# Patient Record
Sex: Female | Born: 1950
Health system: Southern US, Community
[De-identification: ages and names within clinical notes are randomized; demographics above are authoritative.]

## PROBLEM LIST (undated history)

## (undated) DIAGNOSIS — T148XXA Other injury of unspecified body region, initial encounter: Secondary | ICD-10-CM

## (undated) DIAGNOSIS — C44612 Basal cell carcinoma of skin of right upper limb, including shoulder: Secondary | ICD-10-CM

## (undated) DIAGNOSIS — Z9889 Other specified postprocedural states: Secondary | ICD-10-CM

## (undated) DIAGNOSIS — K683 Retroperitoneal hematoma: Secondary | ICD-10-CM

## (undated) DIAGNOSIS — R0600 Dyspnea, unspecified: Secondary | ICD-10-CM

## (undated) DIAGNOSIS — G43909 Migraine, unspecified, not intractable, without status migrainosus: Secondary | ICD-10-CM

## (undated) DIAGNOSIS — I2699 Other pulmonary embolism without acute cor pulmonale: Secondary | ICD-10-CM

## (undated) DIAGNOSIS — M1990 Unspecified osteoarthritis, unspecified site: Secondary | ICD-10-CM

## (undated) DIAGNOSIS — K219 Gastro-esophageal reflux disease without esophagitis: Secondary | ICD-10-CM

## (undated) DIAGNOSIS — I2510 Atherosclerotic heart disease of native coronary artery without angina pectoris: Secondary | ICD-10-CM

## (undated) DIAGNOSIS — G2581 Restless legs syndrome: Secondary | ICD-10-CM

## (undated) DIAGNOSIS — I219 Acute myocardial infarction, unspecified: Secondary | ICD-10-CM

## (undated) DIAGNOSIS — E7800 Pure hypercholesterolemia, unspecified: Secondary | ICD-10-CM

## (undated) DIAGNOSIS — E039 Hypothyroidism, unspecified: Secondary | ICD-10-CM

## (undated) DIAGNOSIS — I82409 Acute embolism and thrombosis of unspecified deep veins of unspecified lower extremity: Secondary | ICD-10-CM

## (undated) DIAGNOSIS — M533 Sacrococcygeal disorders, not elsewhere classified: Secondary | ICD-10-CM

## (undated) DIAGNOSIS — G8929 Other chronic pain: Secondary | ICD-10-CM

## (undated) DIAGNOSIS — I341 Nonrheumatic mitral (valve) prolapse: Secondary | ICD-10-CM

## (undated) DIAGNOSIS — I5042 Chronic combined systolic (congestive) and diastolic (congestive) heart failure: Secondary | ICD-10-CM

## (undated) DIAGNOSIS — R112 Nausea with vomiting, unspecified: Secondary | ICD-10-CM

## (undated) DIAGNOSIS — R0609 Other forms of dyspnea: Secondary | ICD-10-CM

## (undated) DIAGNOSIS — K661 Hemoperitoneum: Secondary | ICD-10-CM

## (undated) HISTORY — DX: Other pulmonary embolism without acute cor pulmonale: I26.99

## (undated) HISTORY — DX: Hypothyroidism, unspecified: E03.9

## (undated) HISTORY — DX: Hemoperitoneum: K66.1

## (undated) HISTORY — DX: Restless legs syndrome: G25.81

## (undated) HISTORY — DX: Pure hypercholesterolemia, unspecified: E78.00

## (undated) HISTORY — DX: Retroperitoneal hematoma: K68.3

## (undated) HISTORY — DX: Other forms of dyspnea: R06.09

## (undated) HISTORY — DX: Unspecified osteoarthritis, unspecified site: M19.90

## (undated) HISTORY — DX: Atherosclerotic heart disease of native coronary artery without angina pectoris: I25.10

## (undated) HISTORY — PX: JOINT REPLACEMENT: SHX530

## (undated) HISTORY — DX: Dyspnea, unspecified: R06.00

## (undated) HISTORY — PX: ANTERIOR CRUCIATE LIGAMENT REPAIR: SHX115

---

## 1996-06-08 HISTORY — PX: TOTAL ABDOMINAL HYSTERECTOMY: SHX209

## 1998-12-07 ENCOUNTER — Encounter: Payer: Self-pay | Admitting: Emergency Medicine

## 1998-12-07 ENCOUNTER — Emergency Department (HOSPITAL_COMMUNITY): Admission: EM | Admit: 1998-12-07 | Discharge: 1998-12-07 | Payer: Self-pay | Admitting: Emergency Medicine

## 2001-04-13 ENCOUNTER — Other Ambulatory Visit: Admission: RE | Admit: 2001-04-13 | Discharge: 2001-04-13 | Payer: Self-pay | Admitting: Gynecology

## 2002-05-26 ENCOUNTER — Other Ambulatory Visit: Admission: RE | Admit: 2002-05-26 | Discharge: 2002-05-26 | Payer: Self-pay | Admitting: Gynecology

## 2003-04-27 ENCOUNTER — Encounter: Admission: RE | Admit: 2003-04-27 | Discharge: 2003-06-13 | Payer: Self-pay | Admitting: Orthopedic Surgery

## 2003-09-11 ENCOUNTER — Other Ambulatory Visit: Admission: RE | Admit: 2003-09-11 | Discharge: 2003-09-11 | Payer: Self-pay | Admitting: Gynecology

## 2004-11-05 ENCOUNTER — Other Ambulatory Visit: Admission: RE | Admit: 2004-11-05 | Discharge: 2004-11-05 | Payer: Self-pay | Admitting: Gynecology

## 2005-02-05 ENCOUNTER — Emergency Department (HOSPITAL_COMMUNITY): Admission: EM | Admit: 2005-02-05 | Discharge: 2005-02-05 | Payer: Self-pay | Admitting: Family Medicine

## 2005-03-11 ENCOUNTER — Ambulatory Visit (HOSPITAL_COMMUNITY): Admission: RE | Admit: 2005-03-11 | Discharge: 2005-03-11 | Payer: Self-pay | Admitting: Orthopedic Surgery

## 2005-05-31 ENCOUNTER — Encounter: Admission: RE | Admit: 2005-05-31 | Discharge: 2005-05-31 | Payer: Self-pay | Admitting: Orthopedic Surgery

## 2005-06-16 ENCOUNTER — Encounter: Admission: RE | Admit: 2005-06-16 | Discharge: 2005-07-23 | Payer: Self-pay | Admitting: Orthopedic Surgery

## 2007-01-14 ENCOUNTER — Emergency Department (HOSPITAL_COMMUNITY): Admission: EM | Admit: 2007-01-14 | Discharge: 2007-01-15 | Payer: Self-pay | Admitting: Emergency Medicine

## 2007-07-22 ENCOUNTER — Other Ambulatory Visit: Admission: RE | Admit: 2007-07-22 | Discharge: 2007-07-22 | Payer: Self-pay | Admitting: Gynecology

## 2007-07-27 ENCOUNTER — Ambulatory Visit (HOSPITAL_COMMUNITY): Admission: RE | Admit: 2007-07-27 | Discharge: 2007-07-27 | Payer: Self-pay | Admitting: Gynecology

## 2008-10-30 ENCOUNTER — Other Ambulatory Visit: Admission: RE | Admit: 2008-10-30 | Discharge: 2008-10-30 | Payer: Self-pay | Admitting: Gynecology

## 2008-10-30 ENCOUNTER — Encounter: Payer: Self-pay | Admitting: Gynecology

## 2008-10-30 ENCOUNTER — Ambulatory Visit: Payer: Self-pay | Admitting: Gynecology

## 2008-12-16 ENCOUNTER — Emergency Department (HOSPITAL_COMMUNITY): Admission: EM | Admit: 2008-12-16 | Discharge: 2008-12-16 | Payer: Self-pay | Admitting: Emergency Medicine

## 2010-01-13 ENCOUNTER — Ambulatory Visit: Payer: Self-pay | Admitting: Gynecology

## 2010-01-13 ENCOUNTER — Other Ambulatory Visit: Admission: RE | Admit: 2010-01-13 | Discharge: 2010-01-13 | Payer: Self-pay | Admitting: Gynecology

## 2010-03-11 ENCOUNTER — Ambulatory Visit (HOSPITAL_COMMUNITY): Admission: RE | Admit: 2010-03-11 | Discharge: 2010-03-11 | Payer: Self-pay | Admitting: Gynecology

## 2010-09-14 LAB — DIFFERENTIAL
Basophils Relative: 1 % (ref 0–1)
Eosinophils Absolute: 0 10*3/uL (ref 0.0–0.7)
Eosinophils Relative: 0 % (ref 0–5)
Monocytes Relative: 5 % (ref 3–12)
Neutrophils Relative %: 68 % (ref 43–77)

## 2010-09-14 LAB — URINALYSIS, ROUTINE W REFLEX MICROSCOPIC
Bilirubin Urine: NEGATIVE
Hgb urine dipstick: NEGATIVE
Ketones, ur: 40 mg/dL — AB
Protein, ur: NEGATIVE mg/dL
Urobilinogen, UA: 0.2 mg/dL (ref 0.0–1.0)

## 2010-09-14 LAB — POCT I-STAT, CHEM 8
Calcium, Ion: 1.17 mmol/L (ref 1.12–1.32)
Creatinine, Ser: 0.7 mg/dL (ref 0.4–1.2)
Glucose, Bld: 107 mg/dL — ABNORMAL HIGH (ref 70–99)
Hemoglobin: 14.6 g/dL (ref 12.0–15.0)
Sodium: 136 mEq/L (ref 135–145)
TCO2: 23 mmol/L (ref 0–100)

## 2010-09-14 LAB — CBC
MCHC: 33.4 g/dL (ref 30.0–36.0)
MCV: 90.1 fL (ref 78.0–100.0)
RBC: 4.55 MIL/uL (ref 3.87–5.11)

## 2011-03-23 LAB — URINALYSIS, ROUTINE W REFLEX MICROSCOPIC
Bilirubin Urine: NEGATIVE
Hgb urine dipstick: NEGATIVE
Protein, ur: NEGATIVE
Urobilinogen, UA: 0.2

## 2011-03-23 LAB — DIFFERENTIAL
Basophils Absolute: 0.3 — ABNORMAL HIGH
Basophils Relative: 2 — ABNORMAL HIGH
Eosinophils Absolute: 0.1
Monocytes Absolute: 0.9 — ABNORMAL HIGH
Neutro Abs: 11.9 — ABNORMAL HIGH
Neutrophils Relative %: 77

## 2011-03-23 LAB — BASIC METABOLIC PANEL
CO2: 25
Calcium: 9.8
Chloride: 98
Creatinine, Ser: 0.81
Glucose, Bld: 120 — ABNORMAL HIGH

## 2011-03-23 LAB — CBC
Hemoglobin: 13.5
MCHC: 34.7
MCV: 86.1
RDW: 13.8

## 2011-06-16 ENCOUNTER — Encounter: Payer: Self-pay | Admitting: Gynecology

## 2011-06-29 ENCOUNTER — Other Ambulatory Visit: Payer: Self-pay | Admitting: Gynecology

## 2011-07-03 ENCOUNTER — Other Ambulatory Visit: Payer: Self-pay | Admitting: Gynecology

## 2011-07-08 DIAGNOSIS — E039 Hypothyroidism, unspecified: Secondary | ICD-10-CM | POA: Insufficient documentation

## 2011-07-08 DIAGNOSIS — E7800 Pure hypercholesterolemia, unspecified: Secondary | ICD-10-CM | POA: Insufficient documentation

## 2011-07-09 ENCOUNTER — Encounter: Payer: Self-pay | Admitting: Gynecology

## 2011-07-09 ENCOUNTER — Ambulatory Visit (INDEPENDENT_AMBULATORY_CARE_PROVIDER_SITE_OTHER): Payer: 59 | Admitting: Gynecology

## 2011-07-09 ENCOUNTER — Other Ambulatory Visit (HOSPITAL_COMMUNITY)
Admission: RE | Admit: 2011-07-09 | Discharge: 2011-07-09 | Disposition: A | Discharge status: Home / Self Care | Payer: 59 | Source: Ambulatory Visit | Attending: Gynecology | Admitting: Gynecology

## 2011-07-09 DIAGNOSIS — Z1211 Encounter for screening for malignant neoplasm of colon: Secondary | ICD-10-CM

## 2011-07-09 DIAGNOSIS — Z01419 Encounter for gynecological examination (general) (routine) without abnormal findings: Secondary | ICD-10-CM | POA: Insufficient documentation

## 2011-07-09 DIAGNOSIS — K921 Melena: Secondary | ICD-10-CM

## 2011-07-09 DIAGNOSIS — E65 Localized adiposity: Secondary | ICD-10-CM

## 2011-07-09 DIAGNOSIS — N951 Menopausal and female climacteric states: Secondary | ICD-10-CM

## 2011-07-09 DIAGNOSIS — Z79890 Hormone replacement therapy (postmenopausal): Secondary | ICD-10-CM

## 2011-07-09 MED ORDER — ESTROGENS CONJUGATED 0.45 MG PO TABS: ORAL_TABLET | ORAL | Status: DC

## 2011-07-09 NOTE — Patient Instructions (Signed)
Brittany Mason, please don't forget to schedule her colonoscopy                                                     .Colonoscopy  A colonoscopy is an exam to evaluate your entire colon. In this exam, your colon is cleansed. A long fiberoptic tube is inserted through your rectum and into your colon. The fiberoptic scope (endoscope) is a long bundle of enclosed and very flexible fibers. These fibers transmit light to the area examined and send images from that area to your caregiver. Discomfort is usually minimal. You may be given a drug to help you sleep (sedative) during or prior to the procedure. This exam helps to detect lumps (tumors), polyps, inflammation, and areas of bleeding. Your caregiver may also take a small piece of tissue (biopsy) that will be examined under a microscope. LET YOUR CAREGIVER KNOW ABOUT:   Allergies to food or medicine.   Medicines taken, including vitamins, herbs, eyedrops, over-the-counter medicines, and creams.   Use of steroids (by mouth or creams).   Previous problems with anesthetics or numbing medicines.   History of bleeding problems or blood clots.   Previous surgery.   Other health problems, including diabetes and kidney problems.   Possibility of pregnancy, if this applies.  BEFORE THE PROCEDURE   A clear liquid diet may be required for 2 days before the exam.   Ask your caregiver about changing or stopping your regular medications.   Liquid injections (enemas) or laxatives may be required.   A large amount of electrolyte solution may be given to you to drink over a short period of time. This solution is used to clean out your colon.   You should be present 60 minutes prior to your procedure or as directed by your caregiver.  AFTER THE PROCEDURE   If you received a sedative or pain relieving medication, you will need to arrange for someone to drive you home.   Occasionally, there is a little blood passed with the first bowel movement. Do not be  concerned.  FINDING OUT THE RESULTS OF YOUR TEST Not all test results are available during your visit. If your test results are not back during the visit, make an appointment with your caregiver to find out the results. Do not assume everything is normal if you have not heard from your caregiver or the medical facility. It is important for you to follow up on all of your test results. HOME CARE INSTRUCTIONS   It is not unusual to pass moderate amounts of gas and experience mild abdominal cramping following the procedure. This is due to air being used to inflate your colon during the exam. Walking or a warm pack on your belly (abdomen) may help.   You may resume all normal meals and activities after sedatives and medicines have worn off.   Only take over-the-counter or prescription medicines for pain, discomfort, or fever as directed by your caregiver. Do not use aspirin or blood thinners if a biopsy was taken. Consult your caregiver for medicine usage if biopsies were taken.  SEEK IMMEDIATE MEDICAL CARE IF:   You have a fever.   You pass large blood clots or fill a toilet with blood following the procedure. This may also occur 10 to 14 days following the procedure. This is more likely  if a biopsy was taken.   You develop abdominal pain that keeps getting worse and cannot be relieved with medicine.  Document Released: 05/22/2000 Document Revised: 02/04/2011 Document Reviewed: 01/05/2008 [facility] Patient Information 2012 [place], [place].

## 2011-07-09 NOTE — Progress Notes (Signed)
Brittany Mason 08-04-1950 [id number]   History:    61 y.o.  for annual exam with no complaints today. Review of her record indicates that she has not had her screening colonoscopy yet. Patient with prior history of total abdominal hysterectomy with bilateral salpingo-oophorectomy secondary to symptomatic leiomyomatous uteri and 1998. We have been tapering her estrogen replacement therapy and she's currently on Premarin 0.5 mg daily and is doing well. Her last bone density study was in October 2011 with normal T score. Patient has history of hypothyroidism and hypertension and hypercholesterolemia he and his been followed by Dr. little.  Past medical history,surgical history, family history and social history were all reviewed and documented in the EPIC chart.  Gynecologic History No LMP recorded. Patient has had a hysterectomy. Contraception: status post hysterectomy Last Pap: 2011. Results were: normal Last mammogram 2011: Normal. Results were: normal  Obstetric History OB History    Grav Para Term Preterm Abortions TAB SAB Ect Mult Living   0                ROS:  Was performed and pertinent positives and negatives are included in the history.  Exam: chaperone present  BP 132/92  Ht 5' 2.75" (1.594 m)  Wt 170 lb (77.111 kg)  BMI 30.35 kg/m2  Body mass index is 30.35 kg/(m^2).  General appearance : Well developed well nourished female. No acute distress HEENT: Neck supple, trachea midline, no carotid bruits, no thyroidmegaly Lungs: Clear to auscultation, no rhonchi or wheezes, or rib retractions  Heart: Regular rate and rhythm, no murmurs or gallops Breast:Examined in sitting and supine position were symmetrical in appearance, no palpable masses or tenderness,  no skin retraction, no nipple inversion, no nipple discharge, no skin discoloration, no axillary or supraclavicular lymphadenopathy Abdomen: no palpable masses or tenderness, no rebound or guarding Extremities: no edema or  skin discoloration or tenderness  Pelvic:  Bartholin, Urethra, Skene Glands: Within normal limits             Vagina: No gross lesions or discharge  Cervix: Absent  Uterus absent  Adnexa  Without masses or tenderness  Anus and perineum  normal   Rectovaginal  normal sphincter tone without palpated masses or tenderness             Hemoccult obtained pending at time of this dictation     Assessment/Plan:  61 y.o. female for annual exam unremarkable. Patient was given names of gastroenterologist in the community for her to schedule her screening colonoscopy. Fecal occult blood testing was done today resulting in time of this dictation. Pap smear was done today per her request although we discussed the new screening guidelines. She will not need another Pap smear since she has had a hysterectomy no prior history of dysplasia. She will need a bone density study next year. She was encouraged to continue her calcium and vitamin D as well as a regular exercise for osteoporosis prevention. Dr. little will be drawn on her labs on her next visit so no blood work was done today. Prescription refill for Premarin 0.45 mg was provided she was encouraged to continue her monthly self breast examination. We'll see her back in one year or when necessary.    Ok Edwards MD, 2:43 PM 07/09/2011

## 2011-09-25 ENCOUNTER — Encounter (HOSPITAL_COMMUNITY): Payer: Self-pay | Admitting: *Deleted

## 2011-09-28 ENCOUNTER — Ambulatory Visit (HOSPITAL_COMMUNITY)
Admission: RE | Admit: 2011-09-28 | Discharge: 2011-09-28 | Disposition: A | Discharge status: Home / Self Care | Payer: 59 | Source: Ambulatory Visit | Attending: Gastroenterology | Admitting: Gastroenterology

## 2011-09-28 ENCOUNTER — Encounter (HOSPITAL_COMMUNITY)
Admission: RE | Disposition: A | Discharge status: Home / Self Care | Payer: Self-pay | Source: Ambulatory Visit | Attending: Gastroenterology

## 2011-09-28 ENCOUNTER — Encounter (HOSPITAL_COMMUNITY): Payer: Self-pay | Admitting: *Deleted

## 2011-09-28 DIAGNOSIS — D128 Benign neoplasm of rectum: Secondary | ICD-10-CM | POA: Insufficient documentation

## 2011-09-28 DIAGNOSIS — E7800 Pure hypercholesterolemia, unspecified: Secondary | ICD-10-CM | POA: Insufficient documentation

## 2011-09-28 DIAGNOSIS — Z79899 Other long term (current) drug therapy: Secondary | ICD-10-CM | POA: Insufficient documentation

## 2011-09-28 DIAGNOSIS — R195 Other fecal abnormalities: Secondary | ICD-10-CM | POA: Insufficient documentation

## 2011-09-28 DIAGNOSIS — K5900 Constipation, unspecified: Secondary | ICD-10-CM | POA: Insufficient documentation

## 2011-09-28 DIAGNOSIS — E039 Hypothyroidism, unspecified: Secondary | ICD-10-CM | POA: Insufficient documentation

## 2011-09-28 HISTORY — DX: Nonrheumatic mitral (valve) prolapse: I34.1

## 2011-09-28 HISTORY — PX: COLONOSCOPY: SHX5424

## 2011-09-28 HISTORY — DX: Other specified postprocedural states: R11.2

## 2011-09-28 HISTORY — DX: Other specified postprocedural states: Z98.890

## 2011-09-28 SURGERY — COLONOSCOPY
Anesthesia: Moderate Sedation

## 2011-09-28 MED ORDER — MIDAZOLAM HCL 10 MG/2ML IJ SOLN
INTRAMUSCULAR | Status: DC | PRN
  Administered 2011-09-28 (×2): 2.5 mg via INTRAVENOUS
  Administered 2011-09-28: 2 mg via INTRAVENOUS

## 2011-09-28 MED ORDER — SODIUM CHLORIDE 0.9 % IV SOLN
Freq: Once | INTRAVENOUS | Status: AC
  Administered 2011-09-28: 500 mL via INTRAVENOUS

## 2011-09-28 MED ORDER — ONDANSETRON HCL 4 MG/2ML IJ SOLN
INTRAMUSCULAR | Status: AC
  Filled 2011-09-28: qty 2

## 2011-09-28 MED ORDER — FENTANYL CITRATE 0.05 MG/ML IJ SOLN
INTRAMUSCULAR | Status: DC | PRN
  Administered 2011-09-28: 50 ug via INTRAVENOUS
  Administered 2011-09-28 (×2): 25 ug via INTRAVENOUS

## 2011-09-28 MED ORDER — MIDAZOLAM HCL 10 MG/2ML IJ SOLN
INTRAMUSCULAR | Status: AC
  Filled 2011-09-28: qty 4

## 2011-09-28 MED ORDER — ONDANSETRON HCL 4 MG/2ML IJ SOLN
INTRAMUSCULAR | Status: DC | PRN
  Administered 2011-09-28: 4 mg via INTRAVENOUS

## 2011-09-28 MED ORDER — DIPHENHYDRAMINE HCL 50 MG/ML IJ SOLN
INTRAMUSCULAR | Status: AC
  Filled 2011-09-28: qty 1

## 2011-09-28 MED ORDER — FENTANYL CITRATE 0.05 MG/ML IJ SOLN
INTRAMUSCULAR | Status: AC
  Filled 2011-09-28: qty 4

## 2011-09-28 NOTE — Discharge Instructions (Addendum)
Colonoscopy Care After Read the instructions outlined below and refer to this sheet in the next few weeks. These discharge instructions provide you with general information on caring for yourself after you leave the hospital. Your doctor may also give you specific instructions. While your treatment has been planned according to the most current medical practices available, unavoidable complications occasionally occur. If you have any problems or questions after discharge, call your doctor. HOME CARE INSTRUCTIONS ACTIVITY:  You may resume your regular activity, but move at a slower pace for the next 24 hours.   Take frequent rest periods for the next 24 hours.   Walking will help get rid of the air and reduce the bloated feeling in your belly (abdomen).   No driving for 24 hours (because of the medicine (anesthesia) used during the test).   You may shower.   Do not sign any important legal documents or operate any machinery for 24 hours (because of the anesthesia used during the test).  NUTRITION:  Drink plenty of fluids.   You may resume your normal diet as instructed by your doctor.   Begin with a light meal and progress to your normal diet. Heavy or fried foods are harder to digest and may make you feel sick to your stomach (nauseated).   Avoid alcoholic beverages for 24 hours or as instructed.  MEDICATIONS:  You may resume your normal medications unless your doctor tells you otherwise.  WHAT TO EXPECT TODAY:  Some feelings of bloating in the abdomen.   Passage of more gas than usual.   Spotting of blood in your stool or on the toilet paper.  IF YOU HAD POLYPS REMOVED DURING THE COLONOSCOPY:  No aspirin products for 7 days or as instructed.   No alcohol for 7 days or as instructed.   Eat a soft diet for the next 24 hours.  FINDING OUT THE RESULTS OF YOUR TEST Not all test results are available during your visit. If your test results are not back during the visit, make an  appointment with your caregiver to find out the results. Do not assume everything is normal if you have not heard from your caregiver or the medical facility. It is important for you to follow up on all of your test results.  SEEK IMMEDIATE MEDICAL CARE IF:  You have more than a spotting of blood in your stool.   Your belly is swollen (abdominal distention).   You are nauseated or vomiting.   You have a fever.   You have abdominal pain or discomfort that is severe or gets worse throughout the day.  Document Released: 01/07/2004 Document Revised: 05/14/2011 Document Reviewed: 01/05/2008 [employer] Patient Information 2012 [employer]. 

## 2011-09-28 NOTE — Op Note (Signed)
[facility] [address] [place], [place]  [address]  OPERATIVE PROCEDURE REPORT  PATIENT:  Brittany Mason, Brittany Mason  MR#:  [id number] BIRTHDATE:  1951/05/28  GENDER:  female ENDOSCOPIST:  Dr. Lorenza Burton, MD ASSISTANT:  Kizzie Bane tehnician & Claudie Revering, RN CGRN.  PROCEDURE DATE:  09/28/2011 PRE-PROCEDURE PREPERATION:  The patient was prepped with 32 oz. of Suprep the night before the procedure and 32 oz. the morning of the procedure.  The patient was fasted for 4 hours prior to the procedure. PRE-PROCEDURE PHYSICAL:  Patient has stable vital signs. Neck is supple. There is no JVD, thyromegaly or LAD. Chest clear to auscultation. S1 and S2 regular. Abdomen soft, non-distended, non-tender with NABS. PROCEDURE:  Colonoscopy with hot snare polypectomy x 1. ASA CLASS:  Class III INDICATIONS:  1) CRC screening 2) Change in bowel habits-constipation  3) Heme positive stool. MEDICATIONS:  Fentanyl 100 mcg & Versed 7 mg IV.  DESCRIPTION OF PROCEDURE: After the risks, benefits, and alternatives of the procedure were thoroughly explained [including a 10% missed rate of cancer and polyps], informed consent was obtained.  Digital rectal exam was performed.  The Pentax video colonoscope [id number] was introduced through the anus and advanced to the cecum, which was identified by both the appendix and ileocecal valve, limited by a redundant colon.  The quality of the prep was excellent.. Multiple washes were done. Small lesions could be missed. The instrument was then slowly withdrawn as the colon was fully examined. <<PROCEDUREIMAGES>>  FINDINGS:  Except for a 7-8 mm sessile rectal polyp that was removed by a hot snare x 1 [200/20], the entire colonic mucosa appeared healthy with a normal vascular pattern. No masses, diverticula or AVM's were noted. The appendiceal orifice and the ICV were identified and photographed. Retroflexed views revealed no abnormalities. The patient  tolerated the procedure without immediate complications.  The scope was then withdrawn from the patient and the procedure terminated.  IMPRESSION:  One 7-8 mm, sessile rectal polyp removed by a hot snare x 1; otherwise, normal colonoscopy up to the cecum.  RECOMMENDATIONS:  1) Await pathology results. 2) Avoid all NSAIDS for the next 2 weeks. 3) Continue surveillance. 4) High fiber diet with liberal fluid intake. 5) OP follow-up is advised on a PRN basis. 6) Repeat guaiacs in 3 months.  REPEAT EXAM:  In 5 years; in case the patient has any abnormal GI symptoms in the interim, she should contact the office immediately for further recommendations.  DISCHARGE INSTRUCTIONS:  Standard discharge instructions given.  ______________________________ Dr. Lorenza Burton, MD  CPT CODES:  [id number]  DIAGNOSIS CODES:  V76.51, 211.3, 792.1  CC:  Reynaldo Minium, M.D. Catha Gosselin, M.D.  n. eSIGNED:   Dr. Lorenza Burton at 09/28/2011 08:19 AM  Marlou Porch, [phone]

## 2011-09-28 NOTE — H&P (Signed)
Brittany Mason is an 61 y.o. female.   Chief Complaint: Guaiac positive stool.  HPI: Patient is here for colorectal cancer screening. She was found to have guaiac positive stool by Dr. Lily Peer on routine screening. She has had problems with constipation lately, as she has been taking narcotics and Cyclobezaprine for joint pains. She takes Miralax on a PRN basis for this. There are no other GI complaints.   Past Medical History  Diagnosis Date  . Hypothyroidism   . Hypercholesterolemia   . RLS (restless legs syndrome)   . PONV (postoperative nausea and vomiting)   . Chronic back pain   . MVP (mitral valve prolapse)     asymptomatic  . Headache     migraine  . Arthritis     osteo    Past Surgical History  Procedure Date  . Abdominal hysterectomy     TAH/BSO  . Knee surgery '76 AND '81    ACL....     Family History  Problem Relation Age of Onset  . Hypertension Mother   . Heart disease Mother   . Heart disease Father   . Hypertension Father   . Stroke Brother     DIED AT 87  . Cancer Maternal Aunt     lung, colon  . Breast cancer Maternal Aunt   . Breast cancer Paternal Aunt    Social History:  reports that she has never smoked. She has never used smokeless tobacco. She reports that she drinks alcohol. She reports that she does not use illicit drugs.  Allergies:  Allergies  Allergen Reactions  . Codeine Nausea And Vomiting    Takes hydrocodone; if takes doses too close together, states "violently throws up"  . Erythromycin    No current facility-administered medications on file as of 09/28/2011.   Medications Prior to Admission  Medication Sig Dispense Refill  . Calcium Carbonate-Vitamin D (CALTRATE 600+D PO) Take 600 mg by mouth 2 (two) times daily.      . cyclobenzaprine (FLEXERIL) 10 MG tablet Take 5 mg by mouth 3 (three) times daily as needed.       . diclofenac (VOLTAREN) 75 MG EC tablet Take 75 mg by mouth 2 (two) times daily.      . DULoxetine (CYMBALTA) 60  MG capsule Take 60 mg by mouth daily.      Marland Kitchen eletriptan (RELPAX) 20 MG tablet One tablet by mouth at onset of headache. May repeat in 2 hours if headache persists or recurs. may repeat in 2 hours if necessary      . estrogens, conjugated, (PREMARIN) 0.45 MG tablet Take by mouth daily. Take daily      . fenofibrate 160 MG tablet Take 160 mg by mouth daily.      Marland Kitchen levothyroxine (SYNTHROID, LEVOTHROID) 150 MCG tablet Take 175 mcg by mouth daily.       Marland Kitchen morphine (MSIR) 30 MG tablet Take 30 mg by mouth every 12 (twelve) hours.      Marland Kitchen omega-3 acid ethyl esters (LOVAZA) 1 G capsule Take 2 g by mouth 2 (two) times daily.      Marland Kitchen rOPINIRole (REQUIP) 0.5 MG tablet Take 0.5 mg by mouth as needed.      . simvastatin (ZOCOR) 40 MG tablet Take 40 mg by mouth every evening.       Review of Systems  Constitutional: Negative.   HENT: Negative.   Eyes: Negative.   Respiratory: Negative.   Cardiovascular: Negative.   Gastrointestinal: Positive for constipation.  Genitourinary: Negative.   Musculoskeletal: Positive for joint pain.  Skin: Negative.   Neurological: Negative.   Endo/Heme/Allergies: Negative.   Psychiatric/Behavioral: Negative.    Blood pressure 148/63, pulse 90, temperature 98.4 F (36.9 C), temperature source Oral, resp. rate 16, height 5\' 3"  (1.6 m), weight 73.483 kg (162 lb), SpO2 94.00%. Physical Exam  Constitutional: She is oriented to person, place, and time. She appears well-developed and well-nourished.  HENT:  Head: Normocephalic and atraumatic.  Eyes: Conjunctivae and EOM are normal. Pupils are equal, round, and reactive to light.  Neck: Normal range of motion. Neck supple.  Cardiovascular: Normal rate, regular rhythm, normal heart sounds and intact distal pulses.   Respiratory: Effort normal and breath sounds normal.  GI: Soft. Bowel sounds are normal.  Musculoskeletal: Normal range of motion.  Neurological: She is alert and oriented to person, place, and time. She has normal  reflexes.  Skin: Skin is warm and dry.  Psychiatric: She has a normal mood and affect. Her behavior is normal. Judgment and thought content normal.    Assessment/Plan 1) Colorectal cancer screening/Guaiac positive stools/Constipation: Proceed with a colonoscopy at this time. 2) Post-operative nausea and vomiting: Will premedicate with Zofran at this time.  Alby Schwabe 09/28/2011, 7:30 AM

## 2011-09-29 ENCOUNTER — Encounter (HOSPITAL_COMMUNITY): Payer: Self-pay | Admitting: Gastroenterology

## 2012-07-23 ENCOUNTER — Other Ambulatory Visit: Payer: Self-pay

## 2012-09-14 ENCOUNTER — Other Ambulatory Visit: Payer: Self-pay | Admitting: *Deleted

## 2012-09-14 MED ORDER — ESTROGENS CONJUGATED 0.45 MG PO TABS: 0.4500 mg | ORAL_TABLET | Freq: Every day | ORAL | Status: DC

## 2012-09-30 ENCOUNTER — Encounter: Payer: Self-pay | Admitting: Gynecology

## 2012-11-03 ENCOUNTER — Encounter: Payer: Self-pay | Admitting: Gynecology

## 2012-12-06 ENCOUNTER — Encounter: Payer: Self-pay | Admitting: Gynecology

## 2012-12-06 ENCOUNTER — Ambulatory Visit (INDEPENDENT_AMBULATORY_CARE_PROVIDER_SITE_OTHER): Payer: 59 | Admitting: Gynecology

## 2012-12-06 VITALS — BP 132/90 | Ht 62.5 in | Wt 192.0 lb

## 2012-12-06 DIAGNOSIS — Z01419 Encounter for gynecological examination (general) (routine) without abnormal findings: Secondary | ICD-10-CM

## 2012-12-06 DIAGNOSIS — Z79890 Hormone replacement therapy (postmenopausal): Secondary | ICD-10-CM

## 2012-12-06 DIAGNOSIS — R1900 Intra-abdominal and pelvic swelling, mass and lump, unspecified site: Secondary | ICD-10-CM

## 2012-12-06 MED ORDER — ESTROGENS CONJUGATED 0.3 MG PO TABS: ORAL_TABLET | ORAL | Status: DC

## 2012-12-06 NOTE — Patient Instructions (Signed)
Tetanus, Diphtheria, Pertussis (Tdap) Vaccine Shingles Shingles (herpes zoster) is an infection that is caused by the same virus that causes chickenpox (varicella). The infection causes a painful skin rash and fluid-filled blisters, which eventually break open, crust over, and heal. It may occur in any area of the body, but it usually affects only one side of the body or face. The pain of shingles usually lasts about 1 month. However, some people with shingles may develop long-term (chronic) pain in the affected area of the body. Shingles often occurs many years after the person had chickenpox. It is more common:  In people older than 50 years.  In people with weakened immune systems, such as those with HIV, AIDS, or cancer.  In people taking medicines that weaken the immune system, such as transplant medicines.  In people under great stress. CAUSES  Shingles is caused by the varicella zoster virus (VZV), which also causes chickenpox. After a person is infected with the virus, it can remain in the person's body for years in an inactive state (dormant). To cause shingles, the virus reactivates and breaks out as an infection in a nerve root. The virus can be spread from person to person (contagious) through contact with open blisters of the shingles rash. It will only spread to people who have not had chickenpox. When these people are exposed to the virus, they may develop chickenpox. They will not develop shingles. Once the blisters scab over, the person is no longer contagious and cannot spread the virus to others. SYMPTOMS  Shingles shows up in stages. The initial symptoms may be pain, itching, and tingling in an area of the skin. This pain is usually described as burning, stabbing, or throbbing.In a few days or weeks, a painful red rash will appear in the area where the pain, itching, and tingling were felt. The rash is usually on one side of the body in a band or belt-like pattern. Then, the rash  usually turns into fluid-filled blisters. They will scab over and dry up in approximately 2 3 weeks. Flu-like symptoms may also occur with the initial symptoms, the rash, or the blisters. These may include:  Fever.  Chills.  Headache.  Upset stomach. DIAGNOSIS  Your caregiver will perform a skin exam to diagnose shingles. Skin scrapings or fluid samples may also be taken from the blisters. This sample will be examined under a microscope or sent to a lab for further testing. TREATMENT  There is no specific cure for shingles. Your caregiver will likely prescribe medicines to help you manage the pain, recover faster, and avoid long-term problems. This may include antiviral drugs, anti-inflammatory drugs, and pain medicines. HOME CARE INSTRUCTIONS   Take a cool bath or apply cool compresses to the area of the rash or blisters as directed. This may help with the pain and itching.   Only take over-the-counter or prescription medicines as directed by your caregiver.   Rest as directed by your caregiver.  Keep your rash and blisters clean with mild soap and cool water or as directed by your caregiver.  Do not pick your blisters or scratch your rash. Apply an anti-itch cream or numbing creams to the affected area as directed by your caregiver.  Keep your shingles rash covered with a loose bandage (dressing).  Avoid skin contact with:  Babies.   Pregnant women.   Children with eczema.   Elderly people with transplants.   People with chronic illnesses, such as leukemia or AIDS.   Wear  loose-fitting clothing to help ease the pain of material rubbing against the rash.  Keep all follow-up appointments with your caregiver.If the area involved is on your face, you may receive a referral for follow-up to a specialist, such as an eye doctor (ophthalmologist) or an ear, nose, and throat (ENT) doctor. Keeping all follow-up appointments will help you avoid eye complications, chronic  pain, or disability.  SEEK IMMEDIATE MEDICAL CARE IF:   You have facial pain, pain around the eye area, or loss of feeling on one side of your face.  You have ear pain or ringing in your ear.  You have loss of taste.  Your pain is not relieved with prescribed medicines.   Your redness or swelling spreads.   You have more pain and swelling.  Your condition is worsening or has changed.   You have a feveror persistent symptoms for more than 2 3 days.  You have a fever and your symptoms suddenly get worse. MAKE SURE YOU:  Understand these instructions.  Will watch your condition.  Will get help right away if you are not doing well or get worse. Document Released: 05/25/2005 Document Revised: 02/17/2012 Document Reviewed: 01/07/2012 [facility] Patient Information 2014 [place], [place]. What You Need to Know WHY GET VACCINATED? Tetanus, diphtheria and pertussis can be very serious diseases, even for adolescents and adults. Tdap vaccine can protect [place] from these diseases. TETANUS (Lockjaw) causes painful muscle tightening and stiffness, usually all over the body.  It can lead to tightening of muscles in the head and neck so you can't open your mouth, swallow, or sometimes even breathe. Tetanus kills about 1 out of 5 people who are infected. DIPHTHERIA can cause a thick coating to form in the back of the throat.  It can lead to breathing problems, paralysis, heart failure, and death. PERTUSSIS (Whooping Cough) causes severe coughing spells, which can cause difficulty breathing, vomiting and disturbed sleep.  It can also lead to weight loss, incontinence, and rib fractures. Up to 2 in 100 adolescents and 5 in 100 adults with pertussis are hospitalized or have complications, which could include pneumonia and death. These diseases are caused by bacteria. Diphtheria and pertussis are spread from person to person through coughing or sneezing. Tetanus enters the body through cuts,  scratches, or wounds. Before vaccines, the [place] saw as many as 200,000 cases a year of diphtheria and pertussis, and hundreds of cases of tetanus. Since vaccination began, tetanus and diphtheria have dropped by about 99% and pertussis by about 80%. TDAP VACCINE Tdap vaccine can protect adolescents and adults from tetanus, diphtheria, and pertussis. One dose of Tdap is routinely given at age 55 or 5. People who did not get Tdap at that age should get it as soon as possible. Tdap is especially important for health care professionals and anyone having close contact with a baby younger than 12 months. Pregnant women should get a dose of Tdap during every pregnancy, to protect the newborn from pertussis. Infants are most at risk for severe, life-threatening complications from pertussis. A similar vaccine, called Td, protects from tetanus and diphtheria, but not pertussis. A Td booster should be given every 10 years. Tdap may be given as one of these boosters if you have not already gotten a dose. Tdap may also be given after a severe cut or burn to prevent tetanus infection. Your doctor can give you more information. Tdap may safely be given at the same time as other vaccines. SOME PEOPLE SHOULD NOT  GET THIS VACCINE  If you ever had a life-threatening allergic reaction after a dose of any tetanus, diphtheria, or pertussis containing vaccine, OR if you have a severe allergy to any part of this vaccine, you should not get Tdap. Tell your doctor if you have any severe allergies.  If you had a coma, or long or multiple seizures within 7 days after a childhood dose of DTP or DTaP, you should not get Tdap, unless a cause other than the vaccine was found. You can still get Td.  Talk to your doctor if you:  have epilepsy or another nervous system problem,  had severe pain or swelling after any vaccine containing diphtheria, tetanus or pertussis,  ever had Guillain-Barr Syndrome (GBS),  aren't  feeling well on the day the shot is scheduled. RISKS OF A VACCINE REACTION With any medicine, including vaccines, there is a chance of side effects. These are usually mild and go away on their own, but serious reactions are also possible. Brief fainting spells can follow a vaccination, leading to injuries from falling. Sitting or lying down for about 15 minutes can help prevent these. Tell your doctor if you feel dizzy or light-headed, or have vision changes or ringing in the ears. Mild problems following Tdap (Did not interfere with activities)  Pain where the shot was given (about 3 in 4 adolescents or 2 in 3 adults)  Redness or swelling where the shot was given (about 1 person in 5)  Mild fever of at least 100.35F (up to about 1 in 25 adolescents or 1 in 100 adults)  Headache (about 3 or 4 people in 10)  Tiredness (about 1 person in 3 or 4)  Nausea, vomiting, diarrhea, stomach ache (up to 1 in 4 adolescents or 1 in 10 adults)  Chills, body aches, sore joints, rash, swollen glands (uncommon) Moderate problems following Tdap (Interfered with activities, but did not require medical attention)  Pain where the shot was given (about 1 in 5 adolescents or 1 in 100 adults)  Redness or swelling where the shot was given (up to about 1 in 16 adolescents or 1 in 25 adults)  Fever over 102F (about 1 in 100 adolescents or 1 in 250 adults)  Headache (about 3 in 20 adolescents or 1 in 10 adults)  Nausea, vomiting, diarrhea, stomach ache (up to 1 or 3 people in 100)  Swelling of the entire arm where the shot was given (up to about 3 in 100). Severe problems following Tdap (Unable to perform usual activities, required medical attention)  Swelling, severe pain, bleeding and redness in the arm where the shot was given (rare). A severe allergic reaction could occur after any vaccine (estimated less than 1 in a million doses). WHAT IF THERE IS A SERIOUS REACTION? What should I look for?  Look  for anything that concerns you, such as signs of a severe allergic reaction, very high fever, or behavior changes. Signs of a severe allergic reaction can include hives, swelling of the face and throat, difficulty breathing, a fast heartbeat, dizziness, and weakness. These would start a few minutes to a few hours after the vaccination. What should I do?  If you think it is a severe allergic reaction or other emergency that can't wait, call 9-1-1 or get the person to the nearest hospital. Otherwise, call your doctor.  Afterward, the reaction should be reported to the "Vaccine Adverse Event Reporting System" (VAERS). Your doctor might file this report, or you can do it  yourself through the VAERS web site at www.vaers.LAgents.no, or by calling 1-310-565-5528. VAERS is only for reporting reactions. They do not give medical advice.  THE NATIONAL VACCINE INJURY COMPENSATION PROGRAM The National Vaccine Injury Compensation Program (VICP) is a federal program that was created to compensate people who may have been injured by certain vaccines. Persons who believe they may have been injured by a vaccine can learn about the program and about filing a claim by calling 1-(430)153-9468 or visiting the VICP website at SpiritualWord.at. HOW CAN I LEARN MORE?  Ask your doctor.  Call your local or state health department.  Contact the Centers for Disease Control and Prevention (CDC):  Call 678-097-0612 or visit CDC's website at PicCapture.uy. CDC Tdap Vaccine VIS (10/15/11) Document Released: 11/24/2011 Document Revised: 02/17/2012 Document Reviewed: 11/24/2011 [employer] Patient Information 2014 [place], [place].

## 2012-12-06 NOTE — Progress Notes (Signed)
Brittany Mason 1950-09-23 [id number]   History:    62 y.o.  for annual gyn exam who is currently tapering down on her estrogen. Patient with past history of TAH with BSO. Patient this past year was on Premarin 0.45 mg daily. She has done well. She works for a [employer] and will check on her Tdap vaccine status. Normal mammogram this year. Patient does her monthly breast exams. In 2013 patient was found to have 1 benign colon polyp.  Past medical history,surgical history, family history and social history were all reviewed and documented in the EPIC chart.  Gynecologic History No LMP recorded. Patient has had a hysterectomy. Contraception: status post hysterectomy Last Pap: 2013. Results were: normal Last mammogram: 2014. Results were: normal  Obstetric History OB History   Grav Para Term Preterm Abortions TAB SAB Ect Mult Living   0                ROS: A ROS was performed and pertinent positives and negatives are included in the history.  GENERAL: No fevers or chills. HEENT: No change in vision, no earache, sore throat or sinus congestion. NECK: No pain or stiffness. CARDIOVASCULAR: No chest pain or pressure. No palpitations. PULMONARY: No shortness of breath, cough or wheeze. GASTROINTESTINAL: No abdominal pain, nausea, vomiting or diarrhea, melena or bright red blood per rectum. GENITOURINARY: No urinary frequency, urgency, hesitancy or dysuria. MUSCULOSKELETAL: No joint or muscle pain, no back pain, no recent trauma. DERMATOLOGIC: No rash, no itching, no lesions. ENDOCRINE: No polyuria, polydipsia, no heat or cold intolerance. No recent change in weight. HEMATOLOGICAL: No anemia or easy bruising or bleeding. NEUROLOGIC: No headache, seizures, numbness, tingling or weakness. PSYCHIATRIC: No depression, no loss of interest in normal activity or change in sleep pattern.     Exam: chaperone present  BP 132/90  Ht 5' 2.5" (1.588 m)  Wt 192 lb (87.091 kg)  BMI 34.54 kg/m2  Body mass  index is 34.54 kg/(m^2).  General appearance : Well developed well nourished female. No acute distress HEENT: Neck supple, trachea midline, no carotid bruits, no thyroidmegaly Lungs: Clear to auscultation, no rhonchi or wheezes, or rib retractions  Heart: Regular rate and rhythm, no murmurs or gallops Breast:Examined in sitting and supine position were symmetrical in appearance, no palpable masses or tenderness,  no skin retraction, no nipple inversion, no nipple discharge, no skin discoloration, no axillary or supraclavicular lymphadenopathy Abdomen: no palpable masses or tenderness, no rebound or guarding Extremities: no edema or skin discoloration or tenderness  Pelvic:  Bartholin, Urethra, Skene Glands: Within normal limits             Vagina: No gross lesions or discharge  Cervix:absence  Uterus Absent  Adnexa  Without masses or tenderness  Anus and perineum  normal   Rectovaginal  normal sphincter tone without palpated masses or tenderness             Hemoccult Card provided   During pelvic exam in the rectovaginal space area behind the vaginal cuff there was questionable indurated area versus stool?    Assessment/Plan:  62 y.o. female for annual exam who has been doing well with her hormone replacement (Premarin 0.45 mg daily). We will continue to taper down her estrogen and we'll prescribe 0.3 mg to take 1 by mouth daily with the goal of discontinuing her estrogen next year. She was reminded on the importance of calcium vitamin D for osteoporosis prevention. She is overdue for her bone density study last  year 2011 was normal. No Pap smear done today the new guidelines discuss. Patient's primary is Dr. Catha Gosselin who has been doing her lab work and monitoring her hypothyroidism and hypercholesterolemia. The patient did receive literature and information on the Tdap vaccine. She was given a prescription to obtain her shingles vaccine. Patient will return to the office in the next  couple weeks for a pelvic ultrasound to better assess the area between the rectum and the vagina behind the vaginal cuff were by an indurated area was noted questionable stool?    Ok Edwards MD, 3:17 PM 12/06/2012

## 2012-12-19 ENCOUNTER — Encounter: Payer: Self-pay | Admitting: Gynecology

## 2012-12-22 ENCOUNTER — Ambulatory Visit (INDEPENDENT_AMBULATORY_CARE_PROVIDER_SITE_OTHER): Payer: 59 | Admitting: Gynecology

## 2012-12-22 ENCOUNTER — Ambulatory Visit (INDEPENDENT_AMBULATORY_CARE_PROVIDER_SITE_OTHER): Payer: 59

## 2012-12-22 DIAGNOSIS — R61 Generalized hyperhidrosis: Secondary | ICD-10-CM | POA: Insufficient documentation

## 2012-12-22 DIAGNOSIS — L74519 Primary focal hyperhidrosis, unspecified: Secondary | ICD-10-CM

## 2012-12-22 DIAGNOSIS — R1900 Intra-abdominal and pelvic swelling, mass and lump, unspecified site: Secondary | ICD-10-CM

## 2012-12-22 DIAGNOSIS — E039 Hypothyroidism, unspecified: Secondary | ICD-10-CM

## 2012-12-22 NOTE — Progress Notes (Signed)
Patient presented to the office today for an ultrasound. She was seen in the office on July 1 for her annual examand during the pelvic exam there was a questionable indurated area versus stool in the rectovaginal space. Patient has a history of total abdominal hysterectomy with bilateral salpingo-oophorectomy for benign entity. She denies any GU or GI complaints. Her primary physician had her on Cymbalta and since she picked up her new prescription and a generic form was provided she is been complaining of hyperhidrosis. She does have history of hypothyroidism and her last TSH was checked one year ago.  Ultrasound today: Vaginal cuff then bilateral adnexa otherwise normal no free fluid or mass seen on transvaginal images.  Assessment/plan: Area of concern during exam appears advanced all since ultrasound was normal. Patient was reassured. As to her hyperhidrosis she will contact her primary physician to place her back on the non-generic Cymbalta but we will check today her TSH as well. She has been on Premarin 0.5 mg daily and has just recently started to taper off her dose to the 0.3 mg daily. The symptoms started before she began tapering down her Premarin.

## 2013-01-11 ENCOUNTER — Telehealth: Payer: Self-pay | Admitting: *Deleted

## 2013-01-11 DIAGNOSIS — Z780 Asymptomatic menopausal state: Secondary | ICD-10-CM

## 2013-01-11 NOTE — Telephone Encounter (Signed)
Pt called requesting bone density order placed per office note on 12/06/12 okay to place order.

## 2013-01-23 ENCOUNTER — Telehealth: Payer: Self-pay | Admitting: *Deleted

## 2013-01-23 NOTE — Telephone Encounter (Signed)
Pt called and would like to increase premarin 0.3 mg pt said she is having hot flashes a lot. Pt would like increase please advise

## 2013-01-24 MED ORDER — ESTROGENS CONJUGATED 0.625 MG PO TABS: ORAL_TABLET | ORAL | Status: DC

## 2013-01-24 NOTE — Telephone Encounter (Signed)
Pt informed with the below note, rx sent. 

## 2013-01-24 NOTE — Telephone Encounter (Signed)
Was the impression that had responded to this message yesterday. It is okay to increase her Premarin to 0.625 mg daily. Prescribed #30 refill x11

## 2013-01-24 NOTE — Telephone Encounter (Signed)
Please see the below note

## 2013-01-26 ENCOUNTER — Ambulatory Visit (HOSPITAL_COMMUNITY)
Admission: RE | Admit: 2013-01-26 | Discharge: 2013-01-26 | Disposition: A | Discharge status: Home / Self Care | Payer: 59 | Source: Ambulatory Visit | Attending: Gynecology | Admitting: Gynecology

## 2013-01-26 DIAGNOSIS — Z13820 Encounter for screening for osteoporosis: Secondary | ICD-10-CM | POA: Insufficient documentation

## 2013-01-26 DIAGNOSIS — Z780 Asymptomatic menopausal state: Secondary | ICD-10-CM | POA: Insufficient documentation

## 2013-04-06 ENCOUNTER — Encounter: Payer: Self-pay | Admitting: Cardiovascular Disease

## 2013-04-06 ENCOUNTER — Ambulatory Visit (HOSPITAL_COMMUNITY)
Admission: RE | Admit: 2013-04-06 | Discharge: 2013-04-06 | Disposition: A | Discharge status: Home / Self Care | Payer: 59 | Source: Ambulatory Visit | Attending: Cardiovascular Disease | Admitting: Cardiovascular Disease

## 2013-04-06 ENCOUNTER — Ambulatory Visit (INDEPENDENT_AMBULATORY_CARE_PROVIDER_SITE_OTHER): Payer: 59 | Admitting: Cardiovascular Disease

## 2013-04-06 VITALS — BP 138/82 | HR 99 | Ht 62.0 in | Wt 182.0 lb

## 2013-04-06 DIAGNOSIS — R0609 Other forms of dyspnea: Secondary | ICD-10-CM | POA: Insufficient documentation

## 2013-04-06 DIAGNOSIS — R0600 Dyspnea, unspecified: Secondary | ICD-10-CM | POA: Insufficient documentation

## 2013-04-06 DIAGNOSIS — R9431 Abnormal electrocardiogram [ECG] [EKG]: Secondary | ICD-10-CM

## 2013-04-06 DIAGNOSIS — R0602 Shortness of breath: Secondary | ICD-10-CM

## 2013-04-06 DIAGNOSIS — E7800 Pure hypercholesterolemia, unspecified: Secondary | ICD-10-CM

## 2013-04-06 DIAGNOSIS — R0989 Other specified symptoms and signs involving the circulatory and respiratory systems: Secondary | ICD-10-CM | POA: Insufficient documentation

## 2013-04-06 NOTE — Patient Instructions (Signed)
  We will see you back in follow up after the tests (next week)  Dr Allyson Sabal has ordered an echocardiogram and a exercise myoview stress test. (has to be done tomorrow or early next week)

## 2013-04-06 NOTE — Progress Notes (Signed)
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 Catha Gosselin for new onset dyspnea on exertion. Her cardiac risk factor profile is De Nurse for 2 hyperlipidemia. She does have a strong family history with a brother who died of myocardial infarction at age 39 and a father who had bypass grafting in his 36s. She has never had a heart stroke. She's been asymptomatic until 2 weeks ago and she's had the sudden onset of progressive dyspnea on exertion. She was referred here for further evaluation. She scheduled to have a elective left total knee replacement in January of next year by Dr. Merlyn Albert .Marland Kitchen   Current Outpatient Prescriptions  Medication Sig Dispense Refill  . cyclobenzaprine (FLEXERIL) 10 MG tablet Take 5 mg by mouth 3 (three) times daily as needed. Pt has 5mg  tablets she takes tid prn      . eletriptan (RELPAX) 20 MG tablet One tablet by mouth at onset of headache. May repeat in 2 hours if headache persists or recurs. may repeat in 2 hours if necessary      . eletriptan (RELPAX) 40 MG tablet Take 40 mg by mouth as needed for migraine. One tablet by mouth at onset of headache. May repeat in 2 hours if headache persists or recurs.      Marland Kitchen estrogens, conjugated, (PREMARIN) 0.625 MG tablet Take 1 tablet by mouth daily  30 tablet  11  . fenofibrate 160 MG tablet Take 160 mg by mouth daily.      Marland Kitchen levothyroxine (SYNTHROID, LEVOTHROID) 150 MCG tablet Take 150 mcg by mouth daily.       Marland Kitchen morphine (MSIR) 30 MG tablet Take 30 mg by mouth every 12 (twelve) hours.      Marland Kitchen omega-3 acid ethyl esters (LOVAZA) 1 G capsule Take 2 g by mouth 2 (two) times daily.      . ondansetron (ZOFRAN-ODT) 8 MG disintegrating tablet Take 8 mg  by mouth every 8 (eight) hours as needed for nausea.      Marland Kitchen rOPINIRole (REQUIP) 0.5 MG tablet Take 0.5 mg by mouth as needed.      . simvastatin (ZOCOR) 40 MG tablet Take 40 mg by mouth every evening.      . venlafaxine (EFFEXOR) 37.5 MG tablet Take 37.5 mg by mouth daily.       No current facility-administered medications for this visit.    Allergies  Allergen Reactions  . Codeine Nausea And Vomiting    Takes hydrocodone; if takes doses too close together, states "violently throws up"  . Erythromycin     History   Social History  . Marital Status: Single    Spouse Name: N/A    Number of Children: N/A  . Years of Education: N/A   Occupational History  . Not on file.   Social History Main Topics  . Smoking status: Never Smoker   . Smokeless tobacco: Never Used  . Alcohol Use: Yes     Comment: 2-3 times a year, one drink at at time  . Drug Use: No  . Sexual Activity: No     Comment: HYSTERECTOMY   Other Topics Concern  . Not on file   Social History Narrative  . No narrative on  file     Review of Systems: General: negative for chills, fever, night sweats or weight changes.  Cardiovascular: negative for chest pain, dyspnea on exertion, edema, orthopnea, palpitations, paroxysmal nocturnal dyspnea or shortness of breath Dermatological: negative for rash Respiratory: negative for cough or wheezing Urologic: negative for hematuria Abdominal: negative for nausea, vomiting, diarrhea, bright red blood per rectum, melena, or hematemesis Neurologic: negative for visual changes, syncope, or dizziness All other systems reviewed and are otherwise negative except as noted above.    Blood pressure 138/82, pulse 99, height 5\' 2"  (1.575 m), weight 182 lb (82.555 kg).  General appearance: alert and no distress Neck: no adenopathy, no carotid bruit, no JVD, supple, symmetrical, trachea midline and thyroid not enlarged, symmetric, no tenderness/mass/nodules Lungs: clear to  auscultation bilaterally Heart: regular rate and rhythm, S1, S2 normal, no murmur, click, rub or gallop Abdomen: soft, non-tender; bowel sounds normal; no masses,  no organomegaly Extremities: extremities normal, atraumatic, no cyanosis or edema Pulses: 2+ and symmetric  EKG sinus rhythm 99 with Q waves across the precordium suggesting an antero-lateral myocardial infarction  ASSESSMENT AND PLAN:   Hypercholesterolemia On statin therapy followed by her PCP.  Dyspnea on exertion Patient is a new onset insertion of left 2 weeks. This has been getting progressively worse. Apparently a chest x-ray performed Dr. Catha Gosselin, her PCP, which was normal. She also complains  of some tightness in her chest bilaterally at the lower porters of her chest. I'm going to get an exercise Myoview stress test on her as well as a 2-D echocardiogram to rule out an ischemic etiology.      Runell Gess MD FACP,FACC,FAHA, [facility] 04/06/2013 12:06 PM

## 2013-04-06 NOTE — Assessment & Plan Note (Addendum)
Patient is a new onset insertion of left 2 weeks. This has been getting progressively worse. Apparently a chest x-ray performed Dr. Catha Gosselin, her PCP, which was normal. She also complains  of some tightness in her chest bilaterally at the lower porters of her chest. I'm going to get an exercise Myoview stress test on her as well as a 2-D echocardiogram to rule out an ischemic etiology.

## 2013-04-06 NOTE — Progress Notes (Signed)
2D Echo Performed 04/06/2013    Brittany Mason, RCS  

## 2013-04-06 NOTE — Assessment & Plan Note (Signed)
On statin therapy followed by her PCP 

## 2013-04-08 DIAGNOSIS — I82409 Acute embolism and thrombosis of unspecified deep veins of unspecified lower extremity: Secondary | ICD-10-CM

## 2013-04-08 HISTORY — DX: Acute embolism and thrombosis of unspecified deep veins of unspecified lower extremity: I82.409

## 2013-04-11 ENCOUNTER — Encounter: Payer: Self-pay | Admitting: *Deleted

## 2013-04-13 ENCOUNTER — Other Ambulatory Visit: Payer: Self-pay

## 2013-04-14 ENCOUNTER — Ambulatory Visit (HOSPITAL_COMMUNITY)
Admission: RE | Admit: 2013-04-14 | Discharge: 2013-04-14 | Disposition: A | Discharge status: Home / Self Care | Payer: 59 | Source: Ambulatory Visit | Attending: Internal Medicine | Admitting: Internal Medicine

## 2013-04-14 DIAGNOSIS — R0600 Dyspnea, unspecified: Secondary | ICD-10-CM

## 2013-04-14 DIAGNOSIS — R0609 Other forms of dyspnea: Secondary | ICD-10-CM | POA: Insufficient documentation

## 2013-04-14 DIAGNOSIS — R0989 Other specified symptoms and signs involving the circulatory and respiratory systems: Secondary | ICD-10-CM | POA: Insufficient documentation

## 2013-04-14 MED ORDER — TECHNETIUM TC 99M SESTAMIBI GENERIC - CARDIOLITE
30.0000 | Freq: Once | INTRAVENOUS | Status: AC | PRN
  Administered 2013-04-14: 30 via INTRAVENOUS

## 2013-04-14 MED ORDER — TECHNETIUM TC 99M SESTAMIBI GENERIC - CARDIOLITE
10.0000 | Freq: Once | INTRAVENOUS | Status: AC | PRN
  Administered 2013-04-14: 10 via INTRAVENOUS

## 2013-04-14 NOTE — Procedures (Addendum)
[facility] [address] [place] [address] [place] [address] [phone]  Cardiology Nuclear Med Study  Brittany Mason is a 62 y.o. female     MRN : [id number]     DOB: 1950/10/11  Procedure Date: 04/14/2013  Nuclear Med Background Indication for Stress Test:  Evaluation for Ischemia, Surgical Clearance and Abnormal EKG History:  MVP Cardiac Risk Factors: Family History - CAD, Lipids and Obesity  Symptoms:  Chest Pain, DOE and Light-Headedness   Nuclear Pre-Procedure Caffeine/Decaff Intake:  7:00pm NPO After: 5:00am   IV Site: R Hand  IV 0.9% NS with Angio Cath:  22g  Chest Size (in):  n/a IV Started by: Emmit Pomfret, RN  Height: 5\' 2"  (1.575 m)  Cup Size: A  BMI:  Body mass index is 33.28 kg/(m^2). Weight:  182 lb (82.555 kg)   Tech Comments:  N/A    Nuclear Med Study 1 or 2 day study: 1 day  Stress Test Type:  Stress  Order Authorizing Provider:  Nanetta Batty, MD   Resting Radionuclide: Technetium 58m Sestamibi  Resting Radionuclide Dose: 10.4 mCi   Stress Radionuclide:  Technetium 48m Sestamibi  Stress Radionuclide Dose: 31.4 mCi           Stress Protocol Rest HR: 103 Stress HR: 157  Rest BP: 110/80 Stress BP: 147/100  Exercise Time (min): 4 METS: 5.8   Predicted Max HR: 160 bpm % Max HR: 98.12 bpm Rate Pressure Product: 13244  Dose of Adenosine (mg):  n/a Dose of Lexiscan: n/a mg  Dose of Atropine (mg): n/a Dose of Dobutamine: n/a mcg/kg/min (at max HR)  Stress Test Technologist: Esperanza Sheets, CCT Nuclear Technologist: Koren Shiver, CNMT   Rest Procedure:  Myocardial perfusion imaging was performed at rest 45 minutes following the intravenous administration of Technetium 14m Sestamibi. Stress Procedure:  The patient performed treadmill exercise using a Bruce  Protocol for 4 minutes. The patient stopped due to Marked SOB and denied any chest pain.  There were no significant ST-T wave changes.  Technetium 108m  Sestamibi was injected at peak exercise and myocardial perfusion imaging was performed after a brief delay.  Transient Ischemic Dilatation (Normal <1.22):  n/a Lung/Heart Ratio (Normal <0.45):  0.25 QGS EDV:  29 ml QGS ESV:  3 ml LV Ejection Fraction: >70%  Rest ECG: NSR - Normal EKG  Stress ECG: No significant change from baseline ECG  QPS Raw Data Images:  Normal; no motion artifact; normal heart/lung ratio. Stress Images:  Normal homogeneous uptake in all areas of the myocardium. Rest Images:  Normal homogeneous uptake in all areas of the myocardium. Subtraction (SDS):  No evidence of ischemia. Small LV cavity.  Impression Exercise Capacity:  Poor exercise capacity. BP Response:  Normal blood pressure response. Clinical Symptoms:  There is dyspnea. ECG Impression:  No significant ST segment change suggestive of ischemia. Comparison with Prior Nuclear Study: No previous nuclear study performed  Overall Impression:  Low risk stress nuclear study with no ischemic changes. The LV cavity is small, therfore gated imaging was not accurate to measure changes in systolic and diastolic chamber dimensions. Cardiac exercise workload was poor.  LV Wall Motion:  Non-gated due to high tracer uptake and small LV cavity.  Chrystie Nose, MD, [facility] Board Certified in Nuclear Cardiology Attending Cardiologist [facility]   Chrystie Nose, MD  04/14/2013 5:06 PM

## 2013-04-16 ENCOUNTER — Encounter: Payer: Self-pay | Admitting: *Deleted

## 2013-04-18 ENCOUNTER — Encounter: Payer: Self-pay | Admitting: *Deleted

## 2013-04-18 ENCOUNTER — Emergency Department (HOSPITAL_COMMUNITY): Payer: 59

## 2013-04-18 ENCOUNTER — Encounter (HOSPITAL_COMMUNITY): Payer: Self-pay | Admitting: Emergency Medicine

## 2013-04-18 ENCOUNTER — Inpatient Hospital Stay (HOSPITAL_COMMUNITY)
Admission: EM | Admit: 2013-04-18 | Discharge: 2013-04-19 | DRG: 299 | Disposition: A | Discharge status: Home / Self Care | Payer: 59 | Attending: Family Medicine | Admitting: Family Medicine

## 2013-04-18 DIAGNOSIS — M549 Dorsalgia, unspecified: Secondary | ICD-10-CM | POA: Diagnosis present

## 2013-04-18 DIAGNOSIS — G2581 Restless legs syndrome: Secondary | ICD-10-CM | POA: Diagnosis present

## 2013-04-18 DIAGNOSIS — E7800 Pure hypercholesterolemia, unspecified: Secondary | ICD-10-CM

## 2013-04-18 DIAGNOSIS — E785 Hyperlipidemia, unspecified: Secondary | ICD-10-CM | POA: Diagnosis present

## 2013-04-18 DIAGNOSIS — E039 Hypothyroidism, unspecified: Secondary | ICD-10-CM | POA: Diagnosis present

## 2013-04-18 DIAGNOSIS — I2699 Other pulmonary embolism without acute cor pulmonale: Secondary | ICD-10-CM | POA: Diagnosis present

## 2013-04-18 DIAGNOSIS — I82402 Acute embolism and thrombosis of unspecified deep veins of left lower extremity: Secondary | ICD-10-CM

## 2013-04-18 DIAGNOSIS — Z8249 Family history of ischemic heart disease and other diseases of the circulatory system: Secondary | ICD-10-CM

## 2013-04-18 DIAGNOSIS — Z823 Family history of stroke: Secondary | ICD-10-CM

## 2013-04-18 DIAGNOSIS — R0600 Dyspnea, unspecified: Secondary | ICD-10-CM

## 2013-04-18 DIAGNOSIS — I824Z9 Acute embolism and thrombosis of unspecified deep veins of unspecified distal lower extremity: Principal | ICD-10-CM | POA: Diagnosis present

## 2013-04-18 DIAGNOSIS — Z90710 Acquired absence of both cervix and uterus: Secondary | ICD-10-CM

## 2013-04-18 DIAGNOSIS — R0902 Hypoxemia: Secondary | ICD-10-CM | POA: Diagnosis present

## 2013-04-18 DIAGNOSIS — G8929 Other chronic pain: Secondary | ICD-10-CM | POA: Diagnosis present

## 2013-04-18 HISTORY — DX: Other pulmonary embolism without acute cor pulmonale: I26.99

## 2013-04-18 LAB — CBC WITH DIFFERENTIAL/PLATELET
Basophils Absolute: 0 10*3/uL (ref 0.0–0.1)
Basophils Relative: 1 % (ref 0–1)
Eosinophils Absolute: 0 10*3/uL (ref 0.0–0.7)
Eosinophils Relative: 1 % (ref 0–5)
HCT: 38.8 % (ref 36.0–46.0)
Hemoglobin: 13.2 g/dL (ref 12.0–15.0)
Lymphs Abs: 2.6 10*3/uL (ref 0.7–4.0)
MCH: 29 pg (ref 26.0–34.0)
MCHC: 34 g/dL (ref 30.0–36.0)
MCV: 85.3 fL (ref 78.0–100.0)
Monocytes Absolute: 0.8 10*3/uL (ref 0.1–1.0)
Monocytes Relative: 10 % (ref 3–12)
Neutrophils Relative %: 57 % (ref 43–77)
RDW: 13.9 % (ref 11.5–15.5)

## 2013-04-18 LAB — BASIC METABOLIC PANEL
BUN: 21 mg/dL (ref 6–23)
Calcium: 9.8 mg/dL (ref 8.4–10.5)
Creatinine, Ser: 1.05 mg/dL (ref 0.50–1.10)
GFR calc Af Amer: 66 mL/min — ABNORMAL LOW (ref >90)
GFR calc non Af Amer: 57 mL/min — ABNORMAL LOW (ref >90)
Glucose, Bld: 115 mg/dL — ABNORMAL HIGH (ref 70–99)
Potassium: 4 mEq/L (ref 3.5–5.1)

## 2013-04-18 LAB — TROPONIN I: Troponin I: <0.3 ng/mL (ref <0.30)

## 2013-04-18 MED ORDER — MORPHINE SULFATE 15 MG PO TABS
30.0000 mg | ORAL_TABLET | Freq: Two times a day (BID) | ORAL | Status: DC
  Administered 2013-04-18 – 2013-04-19 (×2): 30 mg via ORAL
  Filled 2013-04-18 (×2): qty 2

## 2013-04-18 MED ORDER — LEVOTHYROXINE SODIUM 150 MCG PO TABS
150.0000 ug | ORAL_TABLET | Freq: Every day | ORAL | Status: DC
  Administered 2013-04-19: 150 ug via ORAL
  Filled 2013-04-18 (×2): qty 1

## 2013-04-18 MED ORDER — HEPARIN (PORCINE) IN NACL 100-0.45 UNIT/ML-% IJ SOLN
1200.0000 [IU]/h | INTRAMUSCULAR | Status: DC
  Administered 2013-04-18: 1200 [IU]/h via INTRAVENOUS
  Filled 2013-04-18 (×3): qty 250

## 2013-04-18 MED ORDER — SIMVASTATIN 40 MG PO TABS
40.0000 mg | ORAL_TABLET | Freq: Every evening | ORAL | Status: DC
  Administered 2013-04-18: 40 mg via ORAL
  Filled 2013-04-18 (×2): qty 1

## 2013-04-18 MED ORDER — VENLAFAXINE HCL 37.5 MG PO TABS
37.5000 mg | ORAL_TABLET | Freq: Every day | ORAL | Status: DC
  Administered 2013-04-19: 37.5 mg via ORAL
  Filled 2013-04-18: qty 1

## 2013-04-18 MED ORDER — FENOFIBRATE 160 MG PO TABS
160.0000 mg | ORAL_TABLET | Freq: Every day | ORAL | Status: DC
  Administered 2013-04-18 – 2013-04-19 (×2): 160 mg via ORAL
  Filled 2013-04-18 (×2): qty 1

## 2013-04-18 MED ORDER — ACETAMINOPHEN 650 MG RE SUPP: 650.0000 mg | Freq: Four times a day (QID) | RECTAL | Status: DC | PRN

## 2013-04-18 MED ORDER — OMEGA-3-ACID ETHYL ESTERS 1 G PO CAPS
2.0000 g | ORAL_CAPSULE | Freq: Two times a day (BID) | ORAL | Status: DC
  Administered 2013-04-18 – 2013-04-19 (×2): 2 g via ORAL
  Filled 2013-04-18 (×3): qty 2

## 2013-04-18 MED ORDER — SODIUM CHLORIDE 0.9 % IV SOLN: 250.0000 mL | INTRAVENOUS | Status: DC | PRN

## 2013-04-18 MED ORDER — SODIUM CHLORIDE 0.9 % IJ SOLN: 3.0000 mL | Freq: Two times a day (BID) | INTRAMUSCULAR | Status: DC

## 2013-04-18 MED ORDER — SODIUM CHLORIDE 0.9 % IJ SOLN
3.0000 mL | Freq: Two times a day (BID) | INTRAMUSCULAR | Status: DC
  Administered 2013-04-19: 3 mL via INTRAVENOUS

## 2013-04-18 MED ORDER — ALBUTEROL SULFATE (5 MG/ML) 0.5% IN NEBU: 2.5000 mg | INHALATION_SOLUTION | RESPIRATORY_TRACT | Status: DC | PRN

## 2013-04-18 MED ORDER — HEPARIN BOLUS VIA INFUSION
4000.0000 [IU] | Freq: Once | INTRAVENOUS | Status: AC
  Administered 2013-04-18: 4000 [IU] via INTRAVENOUS
  Filled 2013-04-18: qty 4000

## 2013-04-18 MED ORDER — ADULT MULTIVITAMIN W/MINERALS CH
1.0000 | ORAL_TABLET | Freq: Every day | ORAL | Status: DC
  Administered 2013-04-18 – 2013-04-19 (×2): 1 via ORAL
  Filled 2013-04-18 (×2): qty 1

## 2013-04-18 MED ORDER — ACETAMINOPHEN 325 MG PO TABS: 650.0000 mg | ORAL_TABLET | Freq: Four times a day (QID) | ORAL | Status: DC | PRN

## 2013-04-18 MED ORDER — ONDANSETRON 8 MG PO TBDP
8.0000 mg | ORAL_TABLET | Freq: Three times a day (TID) | ORAL | Status: DC | PRN
  Filled 2013-04-18: qty 1

## 2013-04-18 MED ORDER — ONDANSETRON HCL 4 MG PO TABS: 4.0000 mg | ORAL_TABLET | Freq: Four times a day (QID) | ORAL | Status: DC | PRN

## 2013-04-18 MED ORDER — IOHEXOL 350 MG/ML SOLN
100.0000 mL | Freq: Once | INTRAVENOUS | Status: AC | PRN
  Administered 2013-04-18: 100 mL via INTRAVENOUS

## 2013-04-18 MED ORDER — ELETRIPTAN HYDROBROMIDE 40 MG PO TABS
40.0000 mg | ORAL_TABLET | ORAL | Status: DC | PRN
  Filled 2013-04-18: qty 1

## 2013-04-18 MED ORDER — CYCLOBENZAPRINE HCL 5 MG PO TABS
5.0000 mg | ORAL_TABLET | Freq: Three times a day (TID) | ORAL | Status: DC | PRN
  Filled 2013-04-18: qty 1

## 2013-04-18 MED ORDER — SODIUM CHLORIDE 0.9 % IJ SOLN: 3.0000 mL | INTRAMUSCULAR | Status: DC | PRN

## 2013-04-18 MED ORDER — ROPINIROLE HCL 0.5 MG PO TABS
0.5000 mg | ORAL_TABLET | ORAL | Status: DC | PRN
  Filled 2013-04-18: qty 1

## 2013-04-18 MED ORDER — ONDANSETRON HCL 4 MG/2ML IJ SOLN: 4.0000 mg | Freq: Four times a day (QID) | INTRAMUSCULAR | Status: DC | PRN

## 2013-04-18 NOTE — ED Provider Notes (Signed)
CSN: [id number]     Arrival date & time 04/18/13  1735 History   First MD Initiated Contact with Patient 04/18/13 1755     Chief Complaint  Patient presents with  . Shortness of Breath   (Consider location/radiation/quality/duration/timing/severity/associated sxs/prior Treatment) Patient is a 62 y.o. female presenting with shortness of breath. The history is provided by the patient. No language interpreter was used.  Shortness of Breath Severity:  Severe Onset quality:  Gradual Duration:  1 month Timing:  Intermittent Progression:  Worsening Chronicity:  New Context: activity   Relieved by:  Rest Worsened by:  Movement and exertion Associated symptoms: chest pain   Associated symptoms: no abdominal pain, no cough, no diaphoresis, no fever, no headaches, no hemoptysis, no neck pain, no sore throat, no sputum production, no syncope and no vomiting   Chest pain:    Chest pain quality: band-like tightness.   Severity:  Moderate   Onset quality:  Unable to specify   Duration:  1 month   Timing:  Intermittent   Progression:  Waxing and waning   Chronicity:  New Risk factors: no recent alcohol use, no family hx of DVT, no hx of cancer, no hx of PE/DVT, no obesity, no oral contraceptive use (uses premarin cream), no prolonged immobilization, no recent surgery and no tobacco use     Past Medical History  Diagnosis Date  . Hypothyroidism   . Hypercholesterolemia   . RLS (restless legs syndrome)   . PONV (postoperative nausea and vomiting)   . Chronic back pain   . MVP (mitral valve prolapse)     asymptomatic  . Headache(784.0)     migraine  . Arthritis     osteo  . Dyspnea on exertion    Past Surgical History  Procedure Laterality Date  . Abdominal hysterectomy      TAH/BSO  . Knee surgery  '76 AND '81    ACL....   . Colonoscopy  09/28/2011    Procedure: COLONOSCOPY;  Surgeon: Charna Elizabeth, MD;  Location: [facility];  Service: Endoscopy;  Laterality: N/A;   Family  History  Problem Relation Age of Onset  . Hypertension Mother   . Heart disease Mother   . Heart disease Father   . Hypertension Father   . Stroke Brother     DIED AT 42  . Cancer Maternal Aunt     lung, colon  . Breast cancer Maternal Aunt   . Breast cancer Paternal Aunt    History  Substance Use Topics  . Smoking status: Never Smoker   . Smokeless tobacco: Never Used  . Alcohol Use: Yes     Comment: 2-3 times a year, one drink at at time   OB History   Grav Para Term Preterm Abortions TAB SAB Ect Mult Living   0              Review of Systems  Constitutional: Negative for fever, chills, diaphoresis, activity change, appetite change and fatigue.  HENT: Negative for congestion, facial swelling, rhinorrhea and sore throat.   Eyes: Negative for photophobia and discharge.  Respiratory: Positive for shortness of breath. Negative for cough, hemoptysis, sputum production and chest tightness.   Cardiovascular: Positive for chest pain. Negative for palpitations, leg swelling and syncope.  Gastrointestinal: Negative for nausea, vomiting, abdominal pain and diarrhea.  Endocrine: Negative for polydipsia and polyuria.  Genitourinary: Negative for dysuria, frequency, difficulty urinating and pelvic pain.  Musculoskeletal: Negative for arthralgias, back pain, neck pain and neck  stiffness.  Skin: Negative for color change and wound.  Allergic/Immunologic: Negative for immunocompromised state.  Neurological: Negative for facial asymmetry, weakness, numbness and headaches.  Hematological: Does not bruise/bleed easily.  Psychiatric/Behavioral: Negative for confusion and agitation.    Allergies  Codeine and Erythromycin  Home Medications   No current outpatient prescriptions on file. BP 103/74  Pulse 96  Temp(Src) 97.8 F (36.6 C) (Oral)  Resp 20  Ht 5\' 3"  (1.6 m)  Wt 181 lb 14.1 oz (82.5 kg)  BMI 32.23 kg/m2  SpO2 98% Physical Exam  Constitutional: She is oriented to person,  place, and time. She appears well-developed and well-nourished. No distress.  HENT:  Head: Normocephalic and atraumatic.  Mouth/Throat: No oropharyngeal exudate.  Eyes: Pupils are equal, round, and reactive to light.  Neck: Normal range of motion. Neck supple.  Cardiovascular: Regular rhythm and normal heart sounds.  Tachycardia present.  Exam reveals no gallop and no friction rub.   No murmur heard. Pulmonary/Chest: Effort normal and breath sounds normal. No respiratory distress. She has no wheezes. She has no rales.  Abdominal: Soft. Bowel sounds are normal. She exhibits no distension and no mass. There is no tenderness. There is no rebound and no guarding.  Musculoskeletal: Normal range of motion. She exhibits no edema and no tenderness.  Neurological: She is alert and oriented to person, place, and time.  Skin: Skin is warm and dry.  Psychiatric: She has a normal mood and affect.    ED Course  Procedures (including critical care time) Labs Review Labs Reviewed  BASIC METABOLIC PANEL - Abnormal; Notable for the following:    Glucose, Bld 115 (*)    GFR calc non Af Amer 57 (*)    GFR calc Af Amer 66 (*)    All other components within normal limits  PRO B NATRIURETIC PEPTIDE - Abnormal; Notable for the following:    Pro B Natriuretic peptide (BNP) 4796.0 (*)    All other components within normal limits  CBC WITH DIFFERENTIAL  TROPONIN I  PROTIME-INR  APTT  HEPARIN LEVEL (UNFRACTIONATED)  CBC  BASIC METABOLIC PANEL   Imaging Review Ct Angio Chest Pe W/cm &/or Wo Cm  04/18/2013   CLINICAL DATA:  Sudden onset shortness of breath, tachycardia, evaluate for PE  EXAM: CT ANGIOGRAPHY CHEST WITH CONTRAST  TECHNIQUE: Multidetector CT imaging of the chest was performed using the standard protocol during bolus administration of intravenous contrast. Multiplanar CT image reconstructions including MIPs were obtained to evaluate the vascular anatomy.  CONTRAST:  OMNIPAQUE IOHEXOL 350  MG/ML SOLN  COMPARISON:  None.  FINDINGS: Lobar/segmental pulmonary emboli within all lobes. Overall clot burden is moderate. Flattening of the interventricular septum at least raises the possibility of right heart strain (series 5/image 60). Mild retrograde opacification of the hepatic veins.  Lungs are notable for mild mosaic attenuation. No focal consolidation. No suspicious pulmonary nodules. No pleural effusion or pneumothorax.  The heart is top-normal in size.  No pericardial effusion.  No suspicious mediastinal, hilar, or axillary lymphadenopathy.  Visualized upper abdomen is unremarkable.  Visualized osseous structures are within normal limits.  Review of the MIP images confirms the above findings.  IMPRESSION: Lobar/segmental pulmonary emboli within all lobes. Overall clot burden is moderate.  Flattening of the interventricular septum at least raises the possibility of right heart strain, although this remains equivocal.  Critical value/emergent results were called by telephone at the time of interpretation on 04/18/2013 at 8:04 PM to [facility], who verbally  acknowledged these results.   Electronically Signed   By: Charline Bills M.D.   On: 04/18/2013 20:05    EKG Interpretation     Ventricular Rate:    PR Interval:    QRS Duration:   QT Interval:    QTC Calculation:   R Axis:     Text Interpretation:              MDM   1. Pulmonary embolism   2. Chronic back pain   3. Hypercholesterolemia   4. Hypothyroidism    Pt is a 62 y.o. female with Pmhx as above who presents with about 1 month of progressive DOE, band-like chest tightness, now also with BLLE weakness, and sensation or near syncope while standing.   She has had nml CXR, and nml exercise stress test 4 days ago.  Pt in NAD, though appears anxious on PE (99% on RA, HR 105, nml BP).  Lungs clear, no LE edema.   Given tachycardia, estrogen use, there is concern for PE.  W/u shows trop negative, BNP 4796.  PE study  shows BL PE in all lobes, moderate burden.  O2 sat's drop to upper 80's with ambulation.  Will admit to medicine.  Heparin started.  No recent bleeding.        Shanna Cisco, MD 04/19/13 [phone]

## 2013-04-18 NOTE — Progress Notes (Signed)
ANTICOAGULATION CONSULT NOTE - Initial Consult  Pharmacy Consult for heparin  Indication: pulmonary embolus  Allergies  Allergen Reactions  . Codeine Nausea And Vomiting    Takes hydrocodone; if takes doses too close together, states "violently throws up"  . Erythromycin     unknown    Patient Measurements:   Heparin Dosing Weight: 70kg  Vital Signs: Temp: 98 F (36.7 C) (11/11 1749) Temp src: Oral (11/11 1749) BP: 114/79 mmHg (11/11 1948) Pulse Rate: 122 (11/11 1749)  Labs:  Recent Labs  04/18/13 1830  HGB 13.2  HCT 38.8  PLT 265  CREATININE 1.05  TROPONINI <0.30    The CrCl is unknown because both a height and weight (above a minimum accepted value) are required for this calculation.   Medical History: Past Medical History  Diagnosis Date  . Hypothyroidism   . Hypercholesterolemia   . RLS (restless legs syndrome)   . PONV (postoperative nausea and vomiting)   . Chronic back pain   . MVP (mitral valve prolapse)     asymptomatic  . Headache(784.0)     migraine  . Arthritis     osteo  . Dyspnea on exertion     Assessment: 38 YOF with progressive dyspnea s/p nuclear stress test 11/7 which revealed no ischemic changes, presents to [facility] with sudden SOB and feeling faint. CTA reveals multiple BL PEs with moderate clot burden with signs of possible R heart strain. Currently BP is stable, sinus tachycardia noted. Orders to start heparin gtt. Does not have indication for thrombolytic therapy at this time. CBC appropriate to start heparin therapy  Goal of Therapy:  Heparin level 0.3-0.7 units/ml Monitor platelets by anticoagulation protocol: Yes   Plan:   Heparin 4000 units x 1 then heparin gtt 1200units/hr  Check 6h heparin level  Check baseline coags (aPTT and INR)  Daily CBC and heparin level  Juliette Alcide, PharmD, BCPS.   Pager: [phone] 04/18/2013,8:22 PM

## 2013-04-18 NOTE — ED Notes (Signed)
Pt c/o of sudden onset of SOB upon exertion. States that she felt faint when walking upstairs. Had a stress test Friday, no results yet. Increasing worse since yesterday.

## 2013-04-18 NOTE — H&P (Signed)
PATIENT DETAILS Name: Brittany Mason Age: 62 y.o. Sex: female Date of Birth: [birth year] Admit Date: 04/18/2013 EAV:[id number],BJYNW LORNE, MD   CHIEF COMPLAINT:  Shortness of breath  HPI: Brittany Mason is a 62 y.o. female with a Past Medical History of dyslipidemia, migraine headaches, aches Premarin for postmenopausal symptoms who presents today with the above noted complaint. Per patient, since the middle of October-approximately a month ago she started having exertional dyspnea. She is seen her primary care practitioner and a cardiologist in the outpatient setting, has undergone a stress test and an echocardiogram that are negative. Over the past few days the exertional dyspnea but significantly worse to the extent that walking only a few feet from her bed to the bathroom made her short of breath. Because of progressively worsening exertional dyspnea she presented to the emergency room where a CT angiogram of the chest was positive for pulmonary embolism. I was asked to admit this patient for further evaluation and treatment. Patient denies any travel history, she continues to take Premarin for hot flashes and other postmenopausal symptoms. She also claims to have tightness in her bilateral chest area.   ALLERGIES:   Allergies  Allergen Reactions  . Codeine Nausea And Vomiting    Takes hydrocodone; if takes doses too close together, states "violently throws up"  . Erythromycin     unknown    PAST MEDICAL HISTORY: Past Medical History  Diagnosis Date  . Hypothyroidism   . Hypercholesterolemia   . RLS (restless legs syndrome)   . PONV (postoperative nausea and vomiting)   . Chronic back pain   . MVP (mitral valve prolapse)     asymptomatic  . Headache(784.0)     migraine  . Arthritis     osteo  . Dyspnea on exertion     PAST SURGICAL HISTORY: Past Surgical History  Procedure Laterality Date  . Abdominal hysterectomy      TAH/BSO  . Knee surgery  '76 AND '81    ACL....    . Colonoscopy  09/28/2011    Procedure: COLONOSCOPY;  Surgeon: Charna Elizabeth, MD;  Location: [facility];  Service: Endoscopy;  Laterality: N/A;    MEDICATIONS AT HOME: Prior to Admission medications   Medication Sig Start Date End Date Taking? Authorizing Provider  cyclobenzaprine (FLEXERIL) 5 MG tablet Take 5 mg by mouth 3 (three) times daily as needed for muscle spasms.   Yes Historical Provider, MD  eletriptan (RELPAX) 40 MG tablet Take 40 mg by mouth as needed for migraine. One tablet by mouth at onset of headache. May repeat in 2 hours if headache persists or recurs.   Yes Historical Provider, MD  estrogens, conjugated, (PREMARIN) 0.625 MG tablet Take 1 tablet by mouth daily 01/24/13  Yes Ok Edwards, MD  fenofibrate 160 MG tablet Take 160 mg by mouth daily.   Yes Historical Provider, MD  levothyroxine (SYNTHROID, LEVOTHROID) 150 MCG tablet Take 150 mcg by mouth daily.    Yes Historical Provider, MD  morphine (MSIR) 30 MG tablet Take 30 mg by mouth every 12 (twelve) hours.   Yes Historical Provider, MD  Multiple Vitamin (MULTIVITAMIN WITH MINERALS) TABS tablet Take 1 tablet by mouth daily.   Yes Historical Provider, MD  omega-3 acid ethyl esters (LOVAZA) 1 G capsule Take 2 g by mouth 2 (two) times daily.   Yes Historical Provider, MD  ondansetron (ZOFRAN-ODT) 8 MG disintegrating tablet Take 8 mg by mouth every 8 (eight) hours as needed for nausea.   Yes Historical  Provider, MD  rOPINIRole (REQUIP) 0.5 MG tablet Take 0.5 mg by mouth as needed (restless legs).    Yes Historical Provider, MD  simvastatin (ZOCOR) 40 MG tablet Take 40 mg by mouth every evening.   Yes Historical Provider, MD  venlafaxine (EFFEXOR) 37.5 MG tablet Take 37.5 mg by mouth daily.   Yes Historical Provider, MD    FAMILY HISTORY: Family History  Problem Relation Age of Onset  . Hypertension Mother   . Heart disease Mother   . Heart disease Father   . Hypertension Father   . Stroke Brother     DIED AT 83  .  Cancer Maternal Aunt     lung, colon  . Breast cancer Maternal Aunt   . Breast cancer Paternal Aunt     SOCIAL HISTORY:  reports that she has never smoked. She has never used smokeless tobacco. She reports that she drinks alcohol. She reports that she does not use illicit drugs.  REVIEW OF SYSTEMS:  Constitutional:   No  weight loss, night sweats,  Fevers, chills, fatigue.  HEENT:    No headaches, Difficulty swallowing,Tooth/dental problems,Sore throat,  No sneezing, itching, ear ache, nasal congestion, post nasal drip,   Cardio-vascular: No  Orthopnea, PND, swelling in lower extremities, anasarca, dizziness, palpitations  GI:  No heartburn, indigestion, abdominal pain, nausea, vomiting, diarrhea, change in bowel habits, loss of appetite  Resp: No shortness of breath at rest.  No excess mucus, no productive cough, No non-productive cough,  No coughing up of blood.No change in color of mucus.No wheezing.No chest wall deformity  Skin:  no rash or lesions.  GU:  no dysuria, change in color of urine, no urgency or frequency.  No flank pain.  Musculoskeletal: No joint pain or swelling.  No decreased range of motion.  No back pain.  Psych: No change in mood or affect. No depression or anxiety.  No memory loss.   PHYSICAL EXAM: Blood pressure 108/77, pulse 99, temperature 97.9 F (36.6 C), temperature source Oral, resp. rate 16, SpO2 98.00%.  General appearance :Awake, alert, not in any distress. Speech Clear. Not toxic Looking HEENT: Atraumatic and Normocephalic, pupils equally reactive to light and accomodation Neck: supple, no JVD. No cervical lymphadenopathy.  Chest:Good air entry bilaterally, no added sounds  CVS: S1 S2 regular, no murmurs.  Abdomen: Bowel sounds present, Non tender and not distended with no gaurding, rigidity or rebound. Extremities: B/L Lower Ext shows no edema, both legs are warm to touch.? Mild swelling of the right leg compared to the  left. Neurology: Awake alert, and oriented X 3, CN II-XII intact, Non focal Skin:No Rash Wounds:N/A  LABS ON ADMISSION:   Recent Labs  04/18/13 1830  NA 136  K 4.0  CL 103  CO2 19  GLUCOSE 115*  BUN 21  CREATININE 1.05  CALCIUM 9.8   No results found for this basename: AST, ALT, ALKPHOS, BILITOT, PROT, ALBUMIN,  in the last 72 hours No results found for this basename: LIPASE, AMYLASE,  in the last 72 hours  Recent Labs  04/18/13 1830  WBC 8.0  NEUTROABS 4.5  HGB 13.2  HCT 38.8  MCV 85.3  PLT 265    Recent Labs  04/18/13 1830  TROPONINI <0.30   No results found for this basename: DDIMER,  in the last 72 hours No components found with this basename: POCBNP,    RADIOLOGIC STUDIES ON ADMISSION: Ct Angio Chest Pe W/cm &/or Wo Cm  04/18/2013   CLINICAL DATA:  Sudden onset shortness of breath, tachycardia, evaluate for PE  EXAM: CT ANGIOGRAPHY CHEST WITH CONTRAST  TECHNIQUE: Multidetector CT imaging of the chest was performed using the standard protocol during bolus administration of intravenous contrast. Multiplanar CT image reconstructions including MIPs were obtained to evaluate the vascular anatomy.  CONTRAST:  OMNIPAQUE IOHEXOL 350 MG/ML SOLN  COMPARISON:  None.  FINDINGS: Lobar/segmental pulmonary emboli within all lobes. Overall clot burden is moderate. Flattening of the interventricular septum at least raises the possibility of right heart strain (series 5/image 60). Mild retrograde opacification of the hepatic veins.  Lungs are notable for mild mosaic attenuation. No focal consolidation. No suspicious pulmonary nodules. No pleural effusion or pneumothorax.  The heart is top-normal in size.  No pericardial effusion.  No suspicious mediastinal, hilar, or axillary lymphadenopathy.  Visualized upper abdomen is unremarkable.  Visualized osseous structures are within normal limits.  Review of the MIP images confirms the above findings.  IMPRESSION: Lobar/segmental  pulmonary emboli within all lobes. Overall clot burden is moderate.  Flattening of the interventricular septum at least raises the possibility of right heart strain, although this remains equivocal.  Critical value/emergent results were called by telephone at the time of interpretation on 04/18/2013 at 8:04 PM to [facility], who verbally acknowledged these results.   Electronically Signed   By: Charline Bills M.D.   On: 04/18/2013 20:05     ASSESSMENT AND PLAN: Present on Admission:  . Pulmonary embolism  - suspect this may have been provoked by Premarin therapy, will discontinue , even though she has a large clot burden, there are no clinical signs of right ventricular strain as she has no JVD or edema. She has already been started on IV heparin here in the emergency room, which will be continued. I had a long discussion with her regarding option of oral anticoagulations-either Coumadin or new novel anticoagulation agents. Risks, benefits, advantages and disadvantages were all discussed, she currently is thinking about it and will let us know by tomorrow which oral agent she would like to be on.  - We will get a Doppler of her lower extremities, check an echocardiogram.   . Hypercholesterolemia - Continue statin   . Hypothyroidism - Continue levothyroxine   . Chronic back pain - Continue home narcotics   Further plan will depend as patient's clinical course evolves and further radiologic and laboratory data become available. Patient will be monitored closely.   DVT Prophylaxis: On therapeutic anticoagulation-hence not needed   Code Status: Full Code  Total time spent for admission equals 45 minutes.  [facility] Pager [phone]  If 7PM-7AM, please contact night-coverage [email or web address] Password [facility] 04/18/2013, 9:31 PM

## 2013-04-18 NOTE — Progress Notes (Signed)
Pt arrived on the department--ED RN walked pt to bathroom, upon ambulating to the bed, pt became extremely short of breath, dizzy and feeling as if she was "passing out", fingers became pale and was unable to obtain a capillary return. Placed pt to bed and asked her to remain on bedrest until the am when the MD is able to re-assess. Will continue to monitor. VSs and documented.

## 2013-04-18 NOTE — ED Notes (Signed)
Per Jill Alexanders in pharmacy,  PTT and PT/INR need to be drawn and sent to lab before bolus is started. Test does not have to be resulted before heparin is started.

## 2013-04-19 ENCOUNTER — Ambulatory Visit (HOSPITAL_COMMUNITY): Payer: 59

## 2013-04-19 DIAGNOSIS — I82409 Acute embolism and thrombosis of unspecified deep veins of unspecified lower extremity: Secondary | ICD-10-CM

## 2013-04-19 DIAGNOSIS — R0609 Other forms of dyspnea: Secondary | ICD-10-CM

## 2013-04-19 DIAGNOSIS — I2699 Other pulmonary embolism without acute cor pulmonale: Secondary | ICD-10-CM

## 2013-04-19 DIAGNOSIS — I369 Nonrheumatic tricuspid valve disorder, unspecified: Secondary | ICD-10-CM

## 2013-04-19 LAB — BASIC METABOLIC PANEL
Chloride: 101 mEq/L (ref 96–112)
GFR calc Af Amer: 86 mL/min — ABNORMAL LOW (ref >90)
GFR calc non Af Amer: 74 mL/min — ABNORMAL LOW (ref >90)
Potassium: 4.1 mEq/L (ref 3.5–5.1)
Sodium: 132 mEq/L — ABNORMAL LOW (ref 135–145)

## 2013-04-19 LAB — CBC
MCHC: 34 g/dL (ref 30.0–36.0)
Platelets: 253 10*3/uL (ref 150–400)
RBC: 4.35 MIL/uL (ref 3.87–5.11)
RDW: 14 % (ref 11.5–15.5)
WBC: 8.7 10*3/uL (ref 4.0–10.5)

## 2013-04-19 LAB — HEPARIN LEVEL (UNFRACTIONATED): Heparin Unfractionated: 0.56 IU/mL (ref 0.30–0.70)

## 2013-04-19 MED ORDER — RIVAROXABAN 20 MG PO TABS: 20.0000 mg | ORAL_TABLET | Freq: Every day | ORAL | Status: DC

## 2013-04-19 MED ORDER — OXYCODONE-ACETAMINOPHEN 5-325 MG PO TABS: 1.0000 | ORAL_TABLET | ORAL | Status: DC | PRN

## 2013-04-19 MED ORDER — RIVAROXABAN 15 MG PO TABS
15.0000 mg | ORAL_TABLET | Freq: Two times a day (BID) | ORAL | Status: DC
  Filled 2013-04-19 (×2): qty 1

## 2013-04-19 MED ORDER — RIVAROXABAN 15 MG PO TABS
15.0000 mg | ORAL_TABLET | ORAL | Status: AC
  Administered 2013-04-19: 15 mg via ORAL
  Filled 2013-04-19: qty 1

## 2013-04-19 MED ORDER — RIVAROXABAN 15 MG PO TABS: 15.0000 mg | ORAL_TABLET | Freq: Two times a day (BID) | ORAL | Status: DC

## 2013-04-19 NOTE — Progress Notes (Signed)
Pt was assisted up to chair after ECHO procedure and placed on continuous pulse oximeter at bedside. She was encouraged to sit at side of bed before getting up to walk. Oxygen level dropped to low 80's. Oxygen saturation level maintained in 90's while patient was eating meal. She ambulated with assistance to bathroom while on continuous pulse ox and oxygen at 2L. Encourage to breath, while ambulating. O2 level was in 83% on 2L and HR was 130's.

## 2013-04-19 NOTE — Progress Notes (Signed)
ANTICOAGULATION CONSULT NOTE - Initial Consult  Pharmacy Consult for Xarelto Indication: Pulmonary Embolus  Allergies  Allergen Reactions  . Codeine Nausea And Vomiting    Takes hydrocodone; if takes doses too close together, states "violently throws up"  . Erythromycin     unknown    Patient Measurements: Height: 5\' 3"  (160 cm) Weight: 181 lb 14.1 oz (82.5 kg) IBW/kg (Calculated) : 52.4   Vital Signs: Temp: 98 F (36.7 C) (11/12 0430) Temp src: Oral (11/12 0430) BP: 108/72 mmHg (11/12 0430) Pulse Rate: 88 (11/12 0430)  Labs:  Recent Labs  04/18/13 1830 04/18/13 2055 04/19/13 0323 04/19/13 1046  HGB 13.2  --  12.6  --   HCT 38.8  --  37.1  --   PLT 265  --  253  --   APTT  --  30  --   --   LABPROT  --  13.3  --   --   INR  --  1.03  --   --   HEPARINUNFRC  --   --  0.57 0.56  CREATININE 1.05  --  0.84  --   TROPONINI <0.30  --   --   --     Estimated Creatinine Clearance: 72.4 ml/min (by C-G formula based on Cr of 0.84).   Medical History: Past Medical History  Diagnosis Date  . Hypothyroidism   . Hypercholesterolemia   . RLS (restless legs syndrome)   . PONV (postoperative nausea and vomiting)   . Chronic back pain   . MVP (mitral valve prolapse)     asymptomatic  . Headache(784.0)     migraine  . Arthritis     osteo  . Dyspnea on exertion     Medications:  Scheduled:  . fenofibrate  160 mg Oral Daily  . levothyroxine  150 mcg Oral QAC breakfast  . morphine  30 mg Oral Q12H  . multivitamin with minerals  1 tablet Oral Daily  . omega-3 acid ethyl esters  2 g Oral BID  . Rivaroxaban  15 mg Oral NOW  . simvastatin  40 mg Oral QPM  . sodium chloride  3 mL Intravenous Q12H  . sodium chloride  3 mL Intravenous Q12H  . venlafaxine  37.5 mg Oral Daily   Infusions:   PRN: sodium chloride, acetaminophen, acetaminophen, albuterol, cyclobenzaprine, eletriptan, ondansetron (ZOFRAN) IV, ondansetron, ondansetron, rOPINIRole, sodium  chloride  Assessment: 62 y/o F with pulmonary embolus, currently with therapeutic heparin level on IV heparin, for transition to rivaroxaban today and discharge per MD order.    Goal of Therapy:  Proper rivaroxaban dosing Prevention of extension or recurrence of thrombosis  Avoidance of bleeding    Plan:  1. Xarelto 15mg  PO x 1 now as inpatient. 2. Turn off heparin infusion as soon as Xarelto administered (reviewed with RN). 3. Xarelto 15mg  PO BID with food x 3 weeks, then 20mg  PO daily with food.   4. Reviewed teaching points with patient.  Provided teaching materials, demonstrated the starter pack, and provided manufacturer voucher to cover cost of first prescription. 5. Contacted [facility] to confirm they have the Xarelto VTE Starter Pack in stock.  Elie Goody, PharmD, BCPS Pager: [phone] 04/19/2013  12:49 PM    Wilkin Lippy, [place] Barban 04/19/2013,12:44 PM

## 2013-04-19 NOTE — Care Management Note (Signed)
    Page 1 of 1   04/19/2013     2:05:32 PM   CARE MANAGEMENT NOTE 04/19/2013  Patient:  [facility]   Account Number:  [id number]  Date Initiated:  04/19/2013  Documentation initiated by:  Lanier Clam  Subjective/Objective Assessment:   62 Y/O F ADMITTED W/PE.     Action/Plan:   FROM HOME.   Anticipated DC Date:  04/19/2013   Anticipated DC Plan:  HOME/SELF CARE      DC Planning Services  CM consult      Choice offered to / List presented to:             Status of service:  Completed, signed off Medicare Important Message given?   (If response is "NO", the following Medicare IM given date fields will be blank) Date Medicare IM given:   Date Additional Medicare IM given:    Discharge Disposition:  HOME/SELF CARE  Per UR Regulation:  Reviewed for med. necessity/level of care/duration of stay  If discussed at Long Length of Stay Meetings, dates discussed:    Comments:  04/19/13 Kellie Murrill RN,BSN NCM [phone] NO D/C NEEDS OR ORDERS.

## 2013-04-19 NOTE — Progress Notes (Signed)
[facility]  [facility] is providing the following services: Oxygen  If patient discharges after hours, please call [phone].   Renard Hamper 04/19/2013, 12:29 PM

## 2013-04-19 NOTE — Progress Notes (Signed)
Rx Brief Anticoagulation note:  IV Heparin  Assessement:  HL=0.57 units/ml after 4000 unit bolus and drip @ 1200 units/hr  No bleeding/IV interuptions reported per RN  Plan:  No change  Recheck 11 am  Lorenza Evangelist 04/19/2013 5:17 AM

## 2013-04-19 NOTE — Progress Notes (Signed)
VASCULAR LAB PRELIMINARY  PRELIMINARY  PRELIMINARY  PRELIMINARY  Bilateral lower extremity venous duplex  completed.    Preliminary report:  Right:  No evidence of DVT, superficial thrombosis, or Baker's cyst. Left:  Localized DVT noted in the distal PTV.  No evidence of superficial thrombosis.  No Baker's cyst.    Mialee Weyman, RVT 04/19/2013, 10:48 AM

## 2013-04-19 NOTE — Progress Notes (Signed)
Pt had another episode of SOB with exertion, attempting to use the bedpan, unsuccessful. She requested the bedside commode, unsuccessful. Have reminded pt to not get up at this time, will call MD and request foley for tonght. Pt has up twice and symptomatic. Will cont to monitor. SP, RN

## 2013-04-19 NOTE — Progress Notes (Signed)
Echocardiogram 2D Echocardiogram has been performed.  Brittany Mason 04/19/2013, 2:17 PM

## 2013-04-19 NOTE — Discharge Summary (Addendum)
Physician Discharge Summary  Brittany Mason NFA:[id number] DOB: 01/18/1951 DOA: 04/18/2013  PCP: Mickie Hillier, MD  Admit date: 04/18/2013 Discharge date: 04/19/2013  Time spent: 35 minutes  Recommendations for Outpatient Follow-up:  1. Follow up with PCP in 1-2 weeks 2. May consider motorized wheelchair 3. Follow up with Ob/Gyn in 1-2 weeks  Discharge Diagnoses:  Principal Problem:   Pulmonary embolism Active Problems:   Hypothyroidism   Hypercholesterolemia   Chronic back pain   Discharge Condition: Stable  Diet recommendation: Regular  Filed Weights   04/18/13 2115  Weight: 82.5 kg (181 lb 14.1 oz)    History of present illness:  Brittany Mason is a 62 y.o. female with a Past Medical History of dyslipidemia, migraine headaches, aches Premarin for postmenopausal symptoms who presents today with the above noted complaint. Per patient, since the middle of October-approximately a month ago she started having exertional dyspnea. She is seen her primary care practitioner and a cardiologist in the outpatient setting, has undergone a stress test and an echocardiogram that are negative. Over the past few days the exertional dyspnea but significantly worse to the extent that walking only a few feet from her bed to the bathroom made her short of breath. Because of progressively worsening exertional dyspnea she presented to the emergency room where a CT angiogram of the chest was positive for pulmonary embolism. I was asked to admit this patient for further evaluation and treatment.  Patient denies any travel history, she continues to take Premarin for hot flashes and other postmenopausal symptoms.  She also claims to have tightness in her bilateral chest area.  Hospital Course:  The patient was admitted to the floor. Imaging confirmed PE with follow up LE dopplers were pos for LLE DVT. The patient was initially continued on heparin. Anticoagulant regimen was discussed with patient and  the patient agreed to continue with Xarelto. The patient was noted to be hypoxic with O2 sats into the mid 80's (86%) and therefore meets home O2 requirements.  Procedures:  LE doppler US (04/19/13): LLE DVT  Discharge Exam: Filed Vitals:   04/18/13 2102 04/18/13 2107 04/18/13 2115 04/19/13 0430  BP:   103/74 108/72  Pulse: 99  96 88  Temp:  97.9 F (36.6 C) 97.8 F (36.6 C) 98 F (36.7 C)  TempSrc:  Oral Oral Oral  Resp:   20 18  Height:   5\' 3"  (1.6 m)   Weight:   82.5 kg (181 lb 14.1 oz)   SpO2: 98%  98% 99%    General: Awake, in nad Cardiovascular: regular, s1, s2 Respiratory: normal resp effort, no wheezing  Discharge Instructions     Medication List    STOP taking these medications       estrogens (conjugated) 0.625 MG tablet  Commonly known as:  PREMARIN      TAKE these medications       cyclobenzaprine 5 MG tablet  Commonly known as:  FLEXERIL  Take 5 mg by mouth 3 (three) times daily as needed for muscle spasms.     eletriptan 40 MG tablet  Commonly known as:  RELPAX  Take 40 mg by mouth as needed for migraine. One tablet by mouth at onset of headache. May repeat in 2 hours if headache persists or recurs.     fenofibrate 160 MG tablet  Take 160 mg by mouth daily.     levothyroxine 150 MCG tablet  Commonly known as:  SYNTHROID, LEVOTHROID  Take 150 mcg by mouth daily.  morphine 30 MG tablet  Commonly known as:  MSIR  Take 30 mg by mouth every 12 (twelve) hours.     multivitamin with minerals Tabs tablet  Take 1 tablet by mouth daily.     omega-3 acid ethyl esters 1 G capsule  Commonly known as:  LOVAZA  Take 2 g by mouth 2 (two) times daily.     ondansetron 8 MG disintegrating tablet  Commonly known as:  ZOFRAN-ODT  Take 8 mg by mouth every 8 (eight) hours as needed for nausea.     Rivaroxaban 20 MG Tabs tablet  Commonly known as:  XARELTO  Take 1 tablet (20 mg total) by mouth daily with supper.     Rivaroxaban 15 MG Tabs tablet   Commonly known as:  XARELTO  Take 1 tablet (15 mg total) by mouth 2 (two) times daily with a meal.     rOPINIRole 0.5 MG tablet  Commonly known as:  REQUIP  Take 0.5 mg by mouth as needed (restless legs).     simvastatin 40 MG tablet  Commonly known as:  ZOCOR  Take 40 mg by mouth every evening.     venlafaxine 37.5 MG tablet  Commonly known as:  EFFEXOR  Take 37.5 mg by mouth daily.       Allergies  Allergen Reactions  . Codeine Nausea And Vomiting    Takes hydrocodone; if takes doses too close together, states "violently throws up"  . Erythromycin     unknown   Follow-up Information   Follow up with Mickie Hillier, MD. Schedule an appointment as soon as possible for a visit in 1 week.   Specialty:  Family Medicine   Contact information:   [address] [place] [address] [phone]       Schedule an appointment as soon as possible for a visit with follow up with your Ob-Gyn provider in 1-2 weeks.       The results of significant diagnostics from this hospitalization (including imaging, microbiology, ancillary and laboratory) are listed below for reference.    Significant Diagnostic Studies: Ct Angio Chest Pe W/cm &/or Wo Cm  04/18/2013   CLINICAL DATA:  Sudden onset shortness of breath, tachycardia, evaluate for PE  EXAM: CT ANGIOGRAPHY CHEST WITH CONTRAST  TECHNIQUE: Multidetector CT imaging of the chest was performed using the standard protocol during bolus administration of intravenous contrast. Multiplanar CT image reconstructions including MIPs were obtained to evaluate the vascular anatomy.  CONTRAST:  OMNIPAQUE IOHEXOL 350 MG/ML SOLN  COMPARISON:  None.  FINDINGS: Lobar/segmental pulmonary emboli within all lobes. Overall clot burden is moderate. Flattening of the interventricular septum at least raises the possibility of right heart strain (series 5/image 60). Mild retrograde opacification of the hepatic veins.  Lungs are notable for mild  mosaic attenuation. No focal consolidation. No suspicious pulmonary nodules. No pleural effusion or pneumothorax.  The heart is top-normal in size.  No pericardial effusion.  No suspicious mediastinal, hilar, or axillary lymphadenopathy.  Visualized upper abdomen is unremarkable.  Visualized osseous structures are within normal limits.  Review of the MIP images confirms the above findings.  IMPRESSION: Lobar/segmental pulmonary emboli within all lobes. Overall clot burden is moderate.  Flattening of the interventricular septum at least raises the possibility of right heart strain, although this remains equivocal.  Critical value/emergent results were called by telephone at the time of interpretation on 04/18/2013 at 8:04 PM to [facility], who verbally acknowledged these results.   Electronically Signed  By: Charline Bills M.D.   On: 04/18/2013 20:05    Microbiology: No results found for this or any previous visit (from the past 240 hour(s)).   Labs: Basic Metabolic Panel:  Recent Labs Lab 04/18/13 1830 04/19/13 0323  NA 136 132*  K 4.0 4.1  CL 103 101  CO2 19 18*  GLUCOSE 115* 115*  BUN 21 16  CREATININE 1.05 0.84  CALCIUM 9.8 9.3   Liver Function Tests: No results found for this basename: AST, ALT, ALKPHOS, BILITOT, PROT, ALBUMIN,  in the last 168 hours No results found for this basename: LIPASE, AMYLASE,  in the last 168 hours No results found for this basename: AMMONIA,  in the last 168 hours CBC:  Recent Labs Lab 04/18/13 1830 04/19/13 0323  WBC 8.0 8.7  NEUTROABS 4.5  --   HGB 13.2 12.6  HCT 38.8 37.1  MCV 85.3 85.3  PLT 265 253   Cardiac Enzymes:  Recent Labs Lab 04/18/13 1830  TROPONINI <0.30   BNP: BNP (last 3 results)  Recent Labs  04/18/13 1830  PROBNP 4796.0*   CBG: No results found for this basename: GLUCAP,  in the last 168 hours   Signed:  Aishi Courts K  [facility] 04/19/2013, 11:43 AM

## 2013-05-16 ENCOUNTER — Ambulatory Visit (INDEPENDENT_AMBULATORY_CARE_PROVIDER_SITE_OTHER): Payer: 59 | Admitting: Internal Medicine

## 2013-05-16 ENCOUNTER — Encounter: Payer: Self-pay | Admitting: Internal Medicine

## 2013-05-16 VITALS — BP 130/86 | HR 110 | Ht 63.0 in | Wt 178.0 lb

## 2013-05-16 DIAGNOSIS — R0600 Dyspnea, unspecified: Secondary | ICD-10-CM

## 2013-05-16 DIAGNOSIS — R0609 Other forms of dyspnea: Secondary | ICD-10-CM

## 2013-05-16 DIAGNOSIS — I2699 Other pulmonary embolism without acute cor pulmonale: Secondary | ICD-10-CM

## 2013-05-16 NOTE — Assessment & Plan Note (Addendum)
#  Pulmonary embolism  - glad you are better  - continue xarelto for minimum 6 months from 04/18/13; after whice we will reassess longer duration of treatment; my incliniation is 1 year anticoagulatin followed by life long aspirin - regarding scuba diving in April 2015:  Cannot decide now for April 2015 trip but in Jan 2015 have echo and return to decide this - regarding knee surgery in Jan 2015: cancel atleast through summer 2015 - regarding o2 use: no need in day time. 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 

## 2013-05-16 NOTE — Progress Notes (Signed)
Subjective:    Patient ID: Brittany Mason, female    DOB: May 18, 1951, 62 y.o.   MRN: [id number] PCP Mickie Hillier, MD  HPI  62 year old nonsmoker. Referred following submassive pulmonary embolism in 04/18/2013  She had been on Premarin since the late 1990s following a fibroid hysterectomy. 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 Beyond that her history is negative for malignancies [she normal mammogram recently and a normal colonoscopy in 2013], travel history, family history pulmonary embolism, smoking history or recent trauma  Since October 2014 she's had insidious onset of shortness of breath that is been progressive. She at some point in time according to the charts underwent cardiac stress test that was negative. But by 04/18/2013 her dyspnea was so severe that she could hardly walk a few feet and she was admitted to the hospital at [facility] long and found to be hypoxemic. Evaluation showed submassive pulmonary embolism bilaterally on CT scan of the chest the echocardiogram #2 2014 showed severe right ventricular dilation with reduced ejection fraction and elevated systolic pressure along with pulmonary artery systolic pressure elevated at 45 and moderate tricuspid regurgitation. BNP was elevated at 4700. Doppler legs #12 2014 showed distal left popliteal vein thrombosis. She was treated for one day with IV heparin and discharged on 04/19/2013 on xarelto and oxygen   Since discharge he feels subjectively 70% improved in terms of her dyspnea. Early after discharge was guarded with able to make her bed and she found her still desaturating even at rest. Currently she does not desaturate at rest and she's not using oxygen at night but she says she desaturates when she even exerts minimally. She denies any history of any falls, trauma, syncope since discharge  She is handling the treatment of her xarelto  pretty well. She has an initial epistaxis with the oxygen but since is not using oxygen at night she not have any more epistaxis  Walking desaturation test 108 her feet on room air after being off o2 for 15 min and x3 laps: Resting pulse ox and heart rate are 94% and HR ws 110/min -> 3 laps 96% and HR 130/min   SEveral question all discussed in PRoblem list. Most important t her wants clearance to go scuba diving mid-arpil 2015. FAmily trip. Emotional issues. $2K loss as well. So, pushing for clearance. I am concerned about bleeding risk and also barotrauma Review of Systems  Constitutional: Negative for fever, chills, diaphoresis, activity change, appetite change, fatigue and unexpected weight change.  HENT: Positive for postnasal drip and rhinorrhea. Negative for congestion, dental problem, ear discharge, ear pain, facial swelling, hearing loss, mouth sores, nosebleeds, sinus pressure, sneezing, sore throat, tinnitus, trouble swallowing and voice change.   Eyes: Negative for photophobia, discharge, itching and visual disturbance.  Respiratory: Positive for shortness of breath. Negative for apnea, cough, choking, chest tightness, wheezing and stridor.   Cardiovascular: Positive for chest pain. Negative for palpitations and leg swelling.  Gastrointestinal: Negative for nausea, vomiting, abdominal pain, constipation, blood in stool and abdominal distention.  Genitourinary: Negative for dysuria, urgency, frequency, hematuria, flank pain, decreased urine volume and difficulty urinating.  Musculoskeletal: Negative for arthralgias, back pain, gait problem, joint swelling, myalgias, neck pain and neck stiffness.  Skin: Negative for color change, pallor and rash.  Neurological: Negative for dizziness, tremors, seizures, syncope, speech difficulty, weakness, light-headedness, numbness and headaches.  Hematological: Negative for adenopathy.  Does not bruise/bleed easily.  Psychiatric/Behavioral: Negative for  confusion, sleep disturbance and agitation. The patient is not nervous/anxious.    Current outpatient prescriptions:cyclobenzaprine (FLEXERIL) 5 MG tablet, Take 5 mg by mouth 3 (three) times daily as needed for muscle spasms., Disp: , Rfl: ;  eletriptan (RELPAX) 40 MG tablet, Take 40 mg by mouth as needed for migraine. One tablet by mouth at onset of headache. May repeat in 2 hours if headache persists or recurs., Disp: , Rfl: ;  fenofibrate 160 MG tablet, Take 160 mg by mouth daily., Disp: , Rfl:  levothyroxine (SYNTHROID, LEVOTHROID) 150 MCG tablet, Take 150 mcg by mouth daily. , Disp: , Rfl: ;  morphine (MSIR) 30 MG tablet, Take 30 mg by mouth every 12 (twelve) hours., Disp: , Rfl: ;  Multiple Vitamin (MULTIVITAMIN WITH MINERALS) TABS tablet, Take 1 tablet by mouth daily., Disp: , Rfl: ;  omega-3 acid ethyl esters (LOVAZA) 1 G capsule, Take 2 g by mouth 2 (two) times daily., Disp: , Rfl:  ondansetron (ZOFRAN-ODT) 8 MG disintegrating tablet, Take 8 mg by mouth every 8 (eight) hours as needed for nausea., Disp: , Rfl: ;  Rivaroxaban (XARELTO) 20 MG TABS tablet, Take 1 tablet (20 mg total) by mouth daily with supper., Disp: 30 tablet, Rfl: ;  rOPINIRole (REQUIP) 0.5 MG tablet, Take 0.5 mg by mouth as needed (restless legs). , Disp: , Rfl: ;  simvastatin (ZOCOR) 40 MG tablet, Take 40 mg by mouth every evening., Disp: , Rfl:  venlafaxine (EFFEXOR) 37.5 MG tablet, Take 37.5 mg by mouth daily., Disp: , Rfl:      Objective:   Physical Exam  Vitals reviewed. Constitutional: She is oriented to person, place, and time. She appears well-developed and well-nourished. No distress.  HENT:  Head: Normocephalic and atraumatic.  Right Ear: External ear normal.  Left Ear: External ear normal.  Mouth/Throat: Oropharynx is clear and moist. No oropharyngeal exudate.  Eyes: Conjunctivae and EOM are normal. Pupils are equal, round, and reactive to light. Right eye exhibits no discharge. Left eye exhibits no discharge.  No scleral icterus.  Neck: Normal range of motion. Neck supple. No JVD present. No tracheal deviation present. No thyromegaly present.  Cardiovascular: Normal rate, regular rhythm, normal heart sounds and intact distal pulses.  Exam reveals no gallop and no friction rub.   No murmur heard. Pulmonary/Chest: Effort normal and breath sounds normal. No respiratory distress. She has no wheezes. She has no rales. She exhibits no tenderness.  Abdominal: Soft. Bowel sounds are normal. She exhibits no distension and no mass. There is no tenderness. There is no rebound and no guarding.  Musculoskeletal: Normal range of motion. She exhibits no edema and no tenderness.  Lymphadenopathy:    She has no cervical adenopathy.  Neurological: She is alert and oriented to person, place, and time. She has normal reflexes. No cranial nerve deficit. She exhibits normal muscle tone. Coordination normal.  Skin: Skin is warm and dry. No rash noted. She is not diaphoretic. No erythema. No pallor.  Psychiatric: She has a normal mood and affect. Her behavior is normal. Judgment and thought content normal.          Assessment & Plan:

## 2013-05-16 NOTE — Patient Instructions (Addendum)
#  Pulmonary embolism  - glad you are better  - continue xarelto for minimum 6 months from 04/18/13; after whice we will reassess longer duration of treatment; my incliniation is 1 year anticoagulatin followed by life long aspirin - regarding scuba diving in April 2015:  Cannot decide now for April 2015 trip but in Jan 2015 have echo and return to decide this - regarding knee surgery in Jan 2015: cancel atleast through summer 2015 - regarding o2 use: no need in day time. 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

## 2013-05-17 ENCOUNTER — Telehealth: Payer: Self-pay | Admitting: Internal Medicine

## 2013-05-17 NOTE — Telephone Encounter (Signed)
I called and spoke with pt. She is wanting to know when MR thinks she will be able to fly again? Please advise thanks

## 2013-05-17 NOTE — Telephone Encounter (Signed)
Yeah. IF ONO is normal , she can fly without oxygen. IF abnormal, can fly with some portable o2. She can google this info; each airline has different requirements. Best she waits til her ONO results back  .Dr. Kalman Shan, M.D., [facility].C.P Pulmonary and Critical Care Medicine Staff Physician [facility] [place] [facility] Pager: [phone], If no answer or between  15:00h - 7:00h: call [phone]  05/17/2013 1:14 PM

## 2013-05-17 NOTE — Telephone Encounter (Signed)
Pt made aware of recs from MR.

## 2013-05-19 ENCOUNTER — Encounter: Payer: Self-pay | Admitting: Gynecology

## 2013-05-19 ENCOUNTER — Ambulatory Visit (INDEPENDENT_AMBULATORY_CARE_PROVIDER_SITE_OTHER): Payer: 59 | Admitting: Gynecology

## 2013-05-19 VITALS — BP 130/82

## 2013-05-19 DIAGNOSIS — N951 Menopausal and female climacteric states: Secondary | ICD-10-CM

## 2013-05-19 DIAGNOSIS — Z86711 Personal history of pulmonary embolism: Secondary | ICD-10-CM

## 2013-05-19 DIAGNOSIS — R232 Flushing: Secondary | ICD-10-CM

## 2013-05-19 NOTE — Progress Notes (Signed)
Patient is a 62 year old who presented to the office today to discuss her worsening hot flashes irritability and mood swings. Patient has a history of total abdominal hysterectomy and bilateral salpingo-oophorectomy in October 1995 and has been on estrogen replacement therapy since that time. We have made several attempts to taper her off the estrogen replacement therapy and I discussed with her the risks benefits and pros and cons of the medication. We had her once as low at 0.3 mg daily but she requested to go back on 0.625 mg daily and in November of this year she presented to the emergency room and was treated for pulmonary embolism. She is now off the estrogen replacement therapy. She is currently on Xarelto 20 mg daily. Her PCP has had her on Effexor 37.5 mg daily for depression.  We had a lengthy discussion of alternatives treatments for vasomotor symptoms to include other selective serotonin reuptake inhibitors and serotonin norepinephrine reuptake inhibitors as well as gabapentin and clonidine. We also discussed in detail for which she was given samples on Relizen which has been using [place] for over 15 years and is ineffective herbal product without any estrogenic action with excellent safety and patient tolerated delivery as an alternative to hormone therapy for menopausal symptoms. I am also going to recommend that she discuss with her PCP or perhaps switching her over to Lexapro 20 mg daily which would also help with her symptoms. She feels that her Effexor is not helping her for depression. She will let me know in 3 months if this regimen is not working so that we can discuss other alternatives. I had placed a call to the pharmacist who did a med search and did not come up with any information on whether this product may have any effect with patient's blood thinner. I have related this information to Ms. Alonzo and we'll continue to monitor as well as alert her other physician who prescribed  Xarrelto.

## 2013-06-16 ENCOUNTER — Inpatient Hospital Stay: Admit: 2013-06-16 | Payer: Self-pay | Admitting: Orthopedic Surgery

## 2013-06-16 SURGERY — ARTHROPLASTY, KNEE, TOTAL
Anesthesia: Choice | Laterality: Left

## 2013-06-19 ENCOUNTER — Other Ambulatory Visit (HOSPITAL_COMMUNITY): Payer: 59

## 2013-06-20 ENCOUNTER — Telehealth: Payer: Self-pay | Admitting: Internal Medicine

## 2013-06-20 DIAGNOSIS — I2699 Other pulmonary embolism without acute cor pulmonale: Secondary | ICD-10-CM

## 2013-06-20 NOTE — Telephone Encounter (Signed)
Please advise on ONO results. This was ordered back in 05/2013.  Thanks.

## 2013-06-21 NOTE — Telephone Encounter (Signed)
ONO is not in pt chart so I called [facility] to see if they can fax over results to triage fax. I will await fax. [place] Bing, CMA

## 2013-06-22 NOTE — Telephone Encounter (Signed)
ONO 06/03/13  Time spent < 88% is 9.4 minutes. Lowest pulse ox 77%, SO there is some desaturation at night and but if she wants to dc night oxygem that is fine. We can follow along and repeat in several months if needed  Dr. Brand Males, M.D., [facility].C.P Pulmonary and Critical Care Medicine Staff Physician [place] [facility] Pager: [phone], If no answer or between  15:00h - 7:00h: call [phone]  06/22/2013 4:00 PM

## 2013-06-22 NOTE — Telephone Encounter (Signed)
Spoke with the pt and she wants to discontinue oxygen. Order placed. [place] Bing, CMA

## 2013-07-07 ENCOUNTER — Telehealth: Payer: Self-pay | Admitting: Internal Medicine

## 2013-07-07 NOTE — Telephone Encounter (Signed)
ONO on RA 06/03/13 - Non qualifyng results per [facility]. Low pulse ox 85%, Time < 88% - ? 69min.  Basal pulse ox 94%  Dr. Brand Males, M.D., [facility].C.P Pulmonary and Critical Care Medicine Staff Physician [place] [facility] Pager: [phone], If no answer or between  15:00h - 7:00h: call [phone]  07/07/2013 7:10 PM

## 2013-07-31 ENCOUNTER — Ambulatory Visit (HOSPITAL_COMMUNITY): Payer: 59 | Attending: Internal Medicine | Admitting: Cardiology

## 2013-07-31 DIAGNOSIS — R9431 Abnormal electrocardiogram [ECG] [EKG]: Secondary | ICD-10-CM

## 2013-07-31 DIAGNOSIS — R0602 Shortness of breath: Secondary | ICD-10-CM

## 2013-07-31 DIAGNOSIS — I2699 Other pulmonary embolism without acute cor pulmonale: Secondary | ICD-10-CM

## 2013-07-31 DIAGNOSIS — R079 Chest pain, unspecified: Secondary | ICD-10-CM | POA: Insufficient documentation

## 2013-07-31 DIAGNOSIS — R0609 Other forms of dyspnea: Secondary | ICD-10-CM | POA: Insufficient documentation

## 2013-07-31 DIAGNOSIS — Z86711 Personal history of pulmonary embolism: Secondary | ICD-10-CM | POA: Insufficient documentation

## 2013-07-31 DIAGNOSIS — R0989 Other specified symptoms and signs involving the circulatory and respiratory systems: Principal | ICD-10-CM | POA: Insufficient documentation

## 2013-07-31 DIAGNOSIS — R0600 Dyspnea, unspecified: Secondary | ICD-10-CM

## 2013-07-31 NOTE — Progress Notes (Signed)
Echo performed. 

## 2013-08-04 ENCOUNTER — Encounter: Payer: Self-pay | Admitting: *Deleted

## 2013-08-04 ENCOUNTER — Ambulatory Visit (INDEPENDENT_AMBULATORY_CARE_PROVIDER_SITE_OTHER): Payer: 59 | Admitting: Internal Medicine

## 2013-08-04 ENCOUNTER — Encounter: Payer: Self-pay | Admitting: Internal Medicine

## 2013-08-04 VITALS — BP 130/90 | HR 128 | Ht 63.0 in | Wt 191.0 lb

## 2013-08-04 DIAGNOSIS — I2699 Other pulmonary embolism without acute cor pulmonale: Secondary | ICD-10-CM

## 2013-08-04 NOTE — Patient Instructions (Addendum)
#  Pulmonary embolism - glad you are doing beter; echo is normal. Glad shortness of breath is resolved   - continue xarelto; recommend at least one to 2 years and then decide to stop based on D-dimer test or switch to aspirin versus low-dose xarelto  - call me before knee surgery to make a visit  - otherwise see you mid-nov 2015 - can go scuba diving but be careful about risk for bleeding at this time and all times  - any risk of blood clot while on xarelto is unusual but can happen  #Followup  - Mid-Nov 2015

## 2013-08-04 NOTE — Assessment & Plan Note (Addendum)
#  Pulmonary embolism - glad you are doing beter; echo is normal. Glad dyspnea is resolved   - continue xarelto; recommend at least one to 2 years and then decide to stop based on D-dimer test or switch to aspirin versus low-dose xarelto (this is based on risk benefit ratio of clot recurrence and bleeding)  - call me before knee surgery to make a visit  - otherwise see you mid-nov 2015 - can go scuba diving but be careful about risk for bleeding at all times  - any risk of blood clot while on xarelto is unusual but can happen  #Followup  - Mid-Nov 2015

## 2013-08-04 NOTE — Progress Notes (Signed)
Subjective:    Patient ID: Brittany Mason, female    DOB: 05/16/1951, 63 y.o.   MRN: [id number]  HPI  63 year old nonsmoker. submassive pulmonary embolism in 04/18/2013  She had been on Premarin since the late 1990s following a fibroid hysterectomy. 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 Beyond that her history is negative for malignancies [she normal mammogram recently and a normal colonoscopy in 2013], travel history, family history pulmonary embolism, smoking history or recent trauma  Since October 2014 she's had insidious onset of shortness of breath that is been progressive. She at some point in time according to the charts underwent cardiac stress test that was negative. But by 04/18/2013 her dyspnea was so severe that she could hardly walk a few feet and she was admitted to the hospital at [facility] long and found to be hypoxemic. Evaluation showed submassive pulmonary embolism bilaterally on CT scan of the chest the echocardiogram #2 2014 showed severe right ventricular dilation with reduced ejection fraction and elevated systolic pressure along with pulmonary artery systolic pressure elevated at 45 and moderate tricuspid regurgitation. BNP was elevated at 4700. Doppler legs #12 2014 showed distal left popliteal vein thrombosis. She was treated for one day with IV heparin and discharged on 04/19/2013 on xarelto and oxygen   OV 08/04/2013  Chief Complaint  Patient presents with  . Follow-up    to review Echo. Pt is planning on going scuba diving and wants to make sure this is safe.    Followup submassive pulmonary embolism  from 04/18/2013   She continues to be on xarelto at therapeutic dose. She is compliant with this. [place] according him this month the results were reviewed. This is normal and there is no evidence of right ventricular systolic dysfunction or data patient or elevation of  systolic pressure in the pulmonary artery. She continues to do well. No dyspnea. She plans to go scuba diving and wants clearance. She wants to hold off knee surgery at least until June 2015 that six-month passes since her pulmonary embolism  She wants a letter so that she can park safely and not fall  Review of Systems  Constitutional: Negative for fever and unexpected weight change.  HENT: Negative for congestion, dental problem, ear pain, nosebleeds, postnasal drip, rhinorrhea, sinus pressure, sneezing, sore throat and trouble swallowing.   Eyes: Negative for redness and itching.  Respiratory: Negative for cough, chest tightness, shortness of breath and wheezing.   Cardiovascular: Negative for palpitations and leg swelling.  Gastrointestinal: Negative for nausea and vomiting.  Genitourinary: Negative for dysuria.  Musculoskeletal: Negative for joint swelling.  Skin: Negative for rash.  Neurological: Negative for headaches.  Hematological: Does not bruise/bleed easily.  Psychiatric/Behavioral: Negative for dysphoric mood. The patient is not nervous/anxious.        Objective:   Physical Exam  Vitals reviewed. Constitutional: She is oriented to person, place, and time. She appears well-developed and well-nourished. No distress.  HENT:  Head: Normocephalic and atraumatic.  Right Ear: External ear normal.  Left Ear: External ear normal.  Mouth/Throat: Oropharynx is clear and moist. No oropharyngeal exudate.  Eyes: Conjunctivae and EOM are normal. Pupils are equal, round, and reactive to light. Right eye exhibits no discharge. Left eye exhibits no discharge. No scleral icterus.  Neck: Normal range of motion. Neck supple. No JVD present. No tracheal deviation present. No thyromegaly present.  Cardiovascular: Normal  rate, regular rhythm, normal heart sounds and intact distal pulses.  Exam reveals no gallop and no friction rub.   No murmur heard. Pulmonary/Chest: Effort normal and breath  sounds normal. No respiratory distress. She has no wheezes. She has no rales. She exhibits no tenderness.  Abdominal: Soft. Bowel sounds are normal. She exhibits no distension and no mass. There is no tenderness. There is no rebound and no guarding.  Musculoskeletal: Normal range of motion. She exhibits no edema and no tenderness.  Lymphadenopathy:    She has no cervical adenopathy.  Neurological: She is alert and oriented to person, place, and time. She has normal reflexes. No cranial nerve deficit. She exhibits normal muscle tone. Coordination normal.  Skin: Skin is warm and dry. No rash noted. She is not diaphoretic. No erythema. No pallor.  Psychiatric: She has a normal mood and affect. Her behavior is normal. Judgment and thought content normal.          Assessment & Plan:

## 2013-11-14 ENCOUNTER — Ambulatory Visit: Payer: 59 | Admitting: Internal Medicine

## 2013-11-22 ENCOUNTER — Ambulatory Visit (INDEPENDENT_AMBULATORY_CARE_PROVIDER_SITE_OTHER): Payer: 59 | Admitting: Internal Medicine

## 2013-11-22 ENCOUNTER — Encounter: Payer: Self-pay | Admitting: Internal Medicine

## 2013-11-22 VITALS — BP 138/90 | HR 96 | Ht 63.0 in | Wt 185.0 lb

## 2013-11-22 DIAGNOSIS — Z01811 Encounter for preprocedural respiratory examination: Secondary | ICD-10-CM

## 2013-11-22 DIAGNOSIS — I2699 Other pulmonary embolism without acute cor pulmonale: Secondary | ICD-10-CM

## 2013-11-22 NOTE — Patient Instructions (Addendum)
#  Preoperative Pulmonary Evaluation - low risk for pulmonary complication  Due to left knee surgery  -But there is significant risk for DVT/PE after knee surgery - FOllow orthopedic protocol for DVT/PE prevention/treatment   - check with ortho if you can take aspirin 81mg  per day leading upto surgery when you are off xarelto   - check with ortho if ok to hold xarelto for 2 days only prior to knee surgery  #FOllowup  nov 2015

## 2013-11-22 NOTE — Progress Notes (Signed)
Subjective:    Patient ID: Brittany Mason, female    DOB: [birth year], 63 y.o.   MRN: [id number]  HPI  63 year old nonsmoker. submassive pulmonary embolism in 04/18/2013  She had been on Premarin since the late 1990s following a fibroid hysterectomy. 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 Beyond that her history is negative for malignancies [she normal mammogram recently and a normal colonoscopy in 2013], travel history, family history pulmonary embolism, smoking history or recent trauma  Since October 2014 she's had insidious onset of shortness of breath that is been progressive. She at some point in time according to the charts underwent cardiac stress test that was negative. But by 04/18/2013 her dyspnea was so severe that she could hardly walk a few feet and she was admitted to the hospital at [facility] long and found to be hypoxemic. Evaluation showed submassive pulmonary embolism bilaterally on CT scan of the chest the echocardiogram #2 2014 showed severe right ventricular dilation with reduced ejection fraction and elevated systolic pressure along with pulmonary artery systolic pressure elevated at 45 and moderate tricuspid regurgitation. BNP was elevated at 4700. Doppler legs #12 2014 showed distal left popliteal vein thrombosis. She was treated for one day with IV heparin and discharged on 04/19/2013 on xarelto and oxygen   OV 08/04/2013  Chief Complaint  Patient presents with  . Follow-up    to review Echo. Pt is planning on going scuba diving and wants to make sure this is safe.    Followup submassive pulmonary embolism  from 04/18/2013   She continues to be on xarelto at therapeutic dose. She is compliant with this. [place] according him this month the results were reviewed. This is normal and there is no evidence of right ventricular systolic dysfunction or data patient or elevation of  systolic pressure in the pulmonary artery. She continues to do well. No dyspnea. She plans to go scuba diving and wants clearance. She wants to hold off knee surgery at least until June 2015 that six-month passes since her pulmonary embolism  She wants a letter so that she can park safely and not fall   #Pulmonary embolism - glad you are doing beter; echo is normal. Glad shortness of breath is resolved   - continue xarelto; recommend at least one to 2 years and then decide to stop based on D-dimer test or switch to aspirin versus low-dose xarelto  - call me before knee surgery to make a visit  - otherwise see you mid-nov 2015 - can go scuba diving but be careful about risk for bleeding at this time and all times  - any risk of blood clot while on xarelto is unusual but can happen  #Followup  - Mid-Nov 2015    OV 11/22/2013  Chief Complaint  Patient presents with  . Follow-up    Pt is having left knee replacement on 8/3 and is needing clearence. Pt denies cough, dyspnea and CP.    Followup pulmonary embolism. She is having knee surgery to the left knee August 2015. She is here for preop pulmonary evaluation. Since last visit [place] and has done well. She denies any dyspnea, cough, wheezing, chest pain, hemoptysis, edema, orthopnea, paroxysmal nocturnal dyspnea.   She continues xarelto for pulmonary embolism without fail. She does not have any side effects. She has been instructed to stop the  medication 5 days before knee surgery. The concern is of recurrent pulmonary embolism after knee surgery  Review of Systems  Constitutional: Negative for fever and unexpected weight change.  HENT: Negative for congestion, dental problem, ear pain, nosebleeds, postnasal drip, rhinorrhea, sinus pressure, sneezing, sore throat and trouble swallowing.   Eyes: Negative for redness and itching.  Respiratory: Negative for cough, chest tightness, shortness of breath and  wheezing.   Cardiovascular: Negative for palpitations and leg swelling.  Gastrointestinal: Negative for nausea and vomiting.  Genitourinary: Negative for dysuria.  Musculoskeletal: Positive for joint swelling.  Skin: Negative for rash.  Neurological: Negative for headaches.  Hematological: Does not bruise/bleed easily.  Psychiatric/Behavioral: Negative for dysphoric mood. The patient is not nervous/anxious.        Objective:   Physical Exam  Vitals reviewed. Constitutional: She is oriented to person, place, and time. She appears well-developed and well-nourished. No distress.  HENT:  Head: Normocephalic and atraumatic.  Right Ear: External ear normal.  Left Ear: External ear normal.  Mouth/Throat: Oropharynx is clear and moist. No oropharyngeal exudate.  Eyes: Conjunctivae and EOM are normal. Pupils are equal, round, and reactive to light. Right eye exhibits no discharge. Left eye exhibits no discharge. No scleral icterus.  Neck: Normal range of motion. Neck supple. No JVD present. No tracheal deviation present. No thyromegaly present.  Cardiovascular: Normal rate, regular rhythm, normal heart sounds and intact distal pulses.  Exam reveals no gallop and no friction rub.   No murmur heard. Pulmonary/Chest: Effort normal and breath sounds normal. No respiratory distress. She has no wheezes. She has no rales. She exhibits no tenderness.  Abdominal: Soft. Bowel sounds are normal. She exhibits no distension and no mass. There is no tenderness. There is no rebound and no guarding.  Musculoskeletal: Normal range of motion. She exhibits no edema and no tenderness.  Lymphadenopathy:    She has no cervical adenopathy.  Neurological: She is alert and oriented to person, place, and time. She has normal reflexes. No cranial nerve deficit. She exhibits normal muscle tone. Coordination normal.  Skin: Skin is warm and dry. No rash noted. She is not diaphoretic. No erythema. No pallor.  Psychiatric:  She has a normal mood and affect. Her behavior is normal. Judgment and thought content normal.          Assessment & Plan:  #Preoperative Pulmonary Evaluation - low risk for pulmonary complication  Due to left knee surgery  -But there is significant risk for DVT/PE after knee surgery - FOllow orthopedic protocol for DVT/PE prevention/treatment   - check with ortho if you can take aspirin 81mg  per day leading upto surgery when you are off xarelto   - check with ortho if ok to hold xarelto for 2 days only prior to knee surgery  #FOllowup  nov 2015

## 2013-11-26 NOTE — Assessment & Plan Note (Signed)
#  Preoperative Pulmonary Evaluation - low risk for pulmonary complication  Due to left knee surgery  -But there is significant risk for DVT/PE after knee surgery - FOllow orthopedic protocol for DVT/PE prevention/treatment   - check with ortho if you can take aspirin 81mg  per day leading upto surgery when you are off xarelto   - check with ortho if ok to hold xarelto for 2 days only prior to knee surgery  #FOllowup  nov 2015

## 2013-12-21 ENCOUNTER — Other Ambulatory Visit: Payer: Self-pay | Admitting: Orthopedic Surgery

## 2013-12-26 ENCOUNTER — Encounter (HOSPITAL_COMMUNITY): Payer: Self-pay | Admitting: Pharmacy Technician

## 2014-01-02 ENCOUNTER — Ambulatory Visit (HOSPITAL_COMMUNITY)
Admission: RE | Admit: 2014-01-02 | Discharge: 2014-01-02 | Disposition: A | Discharge status: Home / Self Care | Payer: 59 | Source: Ambulatory Visit | Attending: Orthopedic Surgery | Admitting: Orthopedic Surgery

## 2014-01-02 ENCOUNTER — Encounter (HOSPITAL_COMMUNITY)
Admission: RE | Admit: 2014-01-02 | Discharge: 2014-01-02 | Disposition: A | Discharge status: Home / Self Care | Payer: 59 | Source: Ambulatory Visit | Attending: Orthopedic Surgery | Admitting: Orthopedic Surgery

## 2014-01-02 ENCOUNTER — Encounter (HOSPITAL_COMMUNITY): Payer: Self-pay

## 2014-01-02 DIAGNOSIS — Z01818 Encounter for other preprocedural examination: Secondary | ICD-10-CM | POA: Insufficient documentation

## 2014-01-02 DIAGNOSIS — Z86711 Personal history of pulmonary embolism: Secondary | ICD-10-CM | POA: Insufficient documentation

## 2014-01-02 DIAGNOSIS — M47814 Spondylosis without myelopathy or radiculopathy, thoracic region: Secondary | ICD-10-CM | POA: Insufficient documentation

## 2014-01-02 DIAGNOSIS — Z01812 Encounter for preprocedural laboratory examination: Secondary | ICD-10-CM | POA: Insufficient documentation

## 2014-01-02 HISTORY — DX: Acute embolism and thrombosis of unspecified deep veins of unspecified lower extremity: I82.409

## 2014-01-02 LAB — URINALYSIS, ROUTINE W REFLEX MICROSCOPIC
BILIRUBIN URINE: NEGATIVE
Glucose, UA: NEGATIVE mg/dL
Hgb urine dipstick: NEGATIVE
KETONES UR: NEGATIVE mg/dL
NITRITE: NEGATIVE
Protein, ur: NEGATIVE mg/dL
Specific Gravity, Urine: 1.006 (ref 1.005–1.030)
UROBILINOGEN UA: 0.2 mg/dL (ref 0.0–1.0)
pH: 7 (ref 5.0–8.0)

## 2014-01-02 LAB — COMPREHENSIVE METABOLIC PANEL
ALT: 28 U/L (ref 0–35)
AST: 35 U/L (ref 0–37)
Albumin: 4.5 g/dL (ref 3.5–5.2)
Alkaline Phosphatase: 41 U/L (ref 39–117)
Anion gap: 16 — ABNORMAL HIGH (ref 5–15)
BUN: 15 mg/dL (ref 6–23)
CALCIUM: 9.9 mg/dL (ref 8.4–10.5)
CO2: 23 mEq/L (ref 19–32)
Chloride: 98 mEq/L (ref 96–112)
Creatinine, Ser: 0.73 mg/dL (ref 0.50–1.10)
GFR calc non Af Amer: >90 mL/min (ref >90)
GLUCOSE: 113 mg/dL — AB (ref 70–99)
Potassium: 4.3 mEq/L (ref 3.7–5.3)
Sodium: 137 mEq/L (ref 137–147)
TOTAL PROTEIN: 7.6 g/dL (ref 6.0–8.3)
Total Bilirubin: 0.3 mg/dL (ref 0.3–1.2)

## 2014-01-02 LAB — ABO/RH: ABO/RH(D): A POS

## 2014-01-02 LAB — CBC
HCT: 40.3 % (ref 36.0–46.0)
Hemoglobin: 13.7 g/dL (ref 12.0–15.0)
MCH: 29.2 pg (ref 26.0–34.0)
MCHC: 34 g/dL (ref 30.0–36.0)
MCV: 85.9 fL (ref 78.0–100.0)
PLATELETS: 281 10*3/uL (ref 150–400)
RBC: 4.69 MIL/uL (ref 3.87–5.11)
RDW: 13.2 % (ref 11.5–15.5)
WBC: 5.2 10*3/uL (ref 4.0–10.5)

## 2014-01-02 LAB — PROTIME-INR
INR: 1.16 (ref 0.00–1.49)
PROTHROMBIN TIME: 14.8 s (ref 11.6–15.2)

## 2014-01-02 LAB — URINE MICROSCOPIC-ADD ON

## 2014-01-02 LAB — SURGICAL PCR SCREEN
MRSA, PCR: NEGATIVE
STAPHYLOCOCCUS AUREUS: NEGATIVE

## 2014-01-02 LAB — APTT: aPTT: 32 seconds (ref 24–37)

## 2014-01-02 NOTE — Patient Instructions (Signed)
Brittany Mason  01/02/2014   Your procedure is scheduled on:8-3 -2015 Monday at [place] through [facility] Entrance and follow signs to [place]. Arrive at      0600  AM   Call this number if you have problems the morning of surgery: [phone]  Or Presurgical Testing [phone](Brittany Mason) For Living Will and/or Health Care Power Attorney Forms: please provide copy for your medical record,may bring AM of surgery(Forms should be already notarized -we do not provide this service).(01-02-14 Yes-provide copy on AM of 01-08-14 for chart).    Do not eat food:After Midnight.   Take these medicines the morning of surgery with A SIP OF WATER: Levothyroxine. Lexapro. Morphine. Use Xarelto as per MD instructions.   Do not wear jewelry, make-up or nail polish.  Do not wear lotions, powders, or perfumes. You may wear deodorant.  Do not shave 48 hours(2 days) prior to first CHG shower(legs and under arms).(Shaving face and neck okay.)  Do not bring valuables to the hospital.(Hospital is not responsible for lost valuables).  Contacts, dentures or removable bridgework, body piercing, hair pins may not be worn into surgery.  Leave suitcase in the car. After surgery it may be brought to your room.  For patients admitted to the hospital, checkout time is 11:00 AM the day of discharge.(Restricted visitors-Any Persons displaying flu-like symptoms or illness).    Patients discharged the day of surgery will not be allowed to drive home. Must have responsible person with you x 24 hours once discharged.  Name and phone number of your driver: Brittany Mason , [phone] cell  Special Instructions: CHG(Chlorhedine 4%-"Hibiclens","Betasept","Aplicare") Shower Use Special Wash: see special instructions.(avoid face and genitals)   Please read over the following fact sheets that you were given: MRSA Information, Blood Transfusion fact sheet, Incentive Spirometry Instruction.  Remember  : Type/Screen "Blue armbands" - may not be removed once applied(would result in being retested AM of surgery, if removed).    ________________________    [facility] - Preparing for Surgery Before surgery, you can play an important role.  Because skin is not sterile, your skin needs to be as free of germs as possible.  You can reduce the number of germs on your skin by washing with CHG (chlorahexidine gluconate) soap before surgery.  CHG is an antiseptic cleaner which kills germs and bonds with the skin to continue killing germs even after washing. Please DO NOT use if you have an allergy to CHG or antibacterial soaps.  If your skin becomes reddened/irritated stop using the CHG and inform your nurse when you arrive at Short Stay. Do not shave (including legs and underarms) for at least 48 hours prior to the first CHG shower.  You may shave your face/neck. Please follow these instructions carefully:  1.  Shower with CHG Soap the night before surgery and the  morning of Surgery.  2.  If you choose to wash your hair, wash your hair first as usual with your  normal  shampoo.  3.  After you shampoo, rinse your hair and body thoroughly to remove the  shampoo.                           4.  Use CHG as you would any other liquid soap.  You can apply chg directly  to the skin and wash  Gently with a scrungie or clean washcloth.  5.  Apply the CHG Soap to your body ONLY FROM THE NECK DOWN.   Do not use on face/ open                           Wound or open sores. Avoid contact with eyes, ears mouth and genitals (private parts).                       Wash face,  Genitals (private parts) with your normal soap.             6.  Wash thoroughly, paying special attention to the area where your surgery  will be performed.  7.  Thoroughly rinse your body with warm water from the neck down.  8.  DO NOT shower/wash with your normal soap after using and rinsing off  the CHG Soap.                9.   Pat yourself dry with a clean towel.            10.  Wear clean pajamas.            11.  Place clean sheets on your bed the night of your first shower and do not  sleep with pets. Day of Surgery : Do not apply any lotions/deodorants the morning of surgery.  Please wear clean clothes to the hospital/surgery center.  FAILURE TO FOLLOW THESE INSTRUCTIONS MAY RESULT IN THE CANCELLATION OF YOUR SURGERY PATIENT SIGNATURE_________________________________  NURSE SIGNATURE__________________________________  ________________________________________________________________________   Brittany Mason  An incentive spirometer is a tool that can help keep your lungs clear and active. This tool measures how well you are filling your lungs with each breath. Taking long deep breaths may help reverse or decrease the chance of developing breathing (pulmonary) problems (especially infection) following:  A long period of time when you are unable to move or be active. BEFORE THE PROCEDURE   If the spirometer includes an indicator to show your best effort, your nurse or respiratory therapist will set it to a desired goal.  If possible, sit up straight or lean slightly forward. Try not to slouch.  Hold the incentive spirometer in an upright position. INSTRUCTIONS FOR USE  1. Sit on the edge of your bed if possible, or sit up as far as you can in bed or on a chair. 2. Hold the incentive spirometer in an upright position. 3. Breathe out normally. 4. Place the mouthpiece in your mouth and seal your lips tightly around it. 5. Breathe in slowly and as deeply as possible, raising the piston or the ball toward the top of the column. 6. Hold your breath for 3-5 seconds or for as long as possible. Allow the piston or ball to fall to the bottom of the column. 7. Remove the mouthpiece from your mouth and breathe out normally. 8. Rest for a few seconds and repeat Steps 1 through 7 at least 10 times every 1-2 hours  when you are awake. Take your time and take a few normal breaths between deep breaths. 9. The spirometer may include an indicator to show your best effort. Use the indicator as a goal to work toward during each repetition. 10. After each set of 10 deep breaths, practice coughing to be sure your lungs are clear. If you have an incision (the cut made at the time of  surgery), support your incision when coughing by placing a pillow or rolled up towels firmly against it. Once you are able to get out of bed, walk around indoors and cough well. You may stop using the incentive spirometer when instructed by your caregiver.  RISKS AND COMPLICATIONS  Take your time so you do not get dizzy or light-headed.  If you are in pain, you may need to take or ask for pain medication before doing incentive spirometry. It is harder to take a deep breath if you are having pain. AFTER USE  Rest and breathe slowly and easily.  It can be helpful to keep track of a log of your progress. Your caregiver can provide you with a simple table to help with this. If you are using the spirometer at home, follow these instructions: [place] IF:   You are having difficultly using the spirometer.  You have trouble using the spirometer as often as instructed.  Your pain medication is not giving enough relief while using the spirometer.  You develop fever of 100.5 F (38.1 C) or higher. SEEK IMMEDIATE MEDICAL CARE IF:   You cough up bloody sputum that had not been present before.  You develop fever of 102 F (38.9 C) or greater.  You develop worsening pain at or near the incision site. MAKE SURE YOU:   Understand these instructions.  Will watch your condition.  Will get help right away if you are not doing well or get worse. Document Released: 10/05/2006 Document Revised: 08/17/2011 Document Reviewed: 12/06/2006 ExitCare Patient Information 2014 ExitCare,  [place].   ________________________________________________________________________  WHAT IS A BLOOD TRANSFUSION? Blood Transfusion Information  A transfusion is the replacement of blood or some of its parts. Blood is made up of multiple cells which provide different functions.  Red blood cells carry oxygen and are used for blood loss replacement.  White blood cells fight against infection.  Platelets control bleeding.  Plasma helps clot blood.  Other blood products are available for specialized needs, such as hemophilia or other clotting disorders. BEFORE THE TRANSFUSION  Who gives blood for transfusions?   Healthy volunteers who are fully evaluated to make sure their blood is safe. This is blood bank blood. Transfusion therapy is the safest it has ever been in the practice of medicine. Before blood is taken from a donor, a complete history is taken to make sure that person has no history of diseases nor engages in risky social behavior (examples are intravenous drug use or sexual activity with multiple partners). The donor's travel history is screened to minimize risk of transmitting infections, such as malaria. The donated blood is tested for signs of infectious diseases, such as HIV and hepatitis. The blood is then tested to be sure it is compatible with you in order to minimize the chance of a transfusion reaction. If you or a relative donates blood, this is often done in anticipation of surgery and is not appropriate for emergency situations. It takes many days to process the donated blood. RISKS AND COMPLICATIONS Although transfusion therapy is very safe and saves many lives, the main dangers of transfusion include:   Getting an infectious disease.  Developing a transfusion reaction. This is an allergic reaction to something in the blood you were given. Every precaution is taken to prevent this. The decision to have a blood transfusion has been considered carefully by your caregiver  before blood is given. Blood is not given unless the benefits outweigh the risks. AFTER THE  TRANSFUSION  Right after receiving a blood transfusion, you will usually feel much better and more energetic. This is especially true if your red blood cells have gotten low (anemic). The transfusion raises the level of the red blood cells which carry oxygen, and this usually causes an energy increase.  The nurse administering the transfusion will monitor you carefully for complications. HOME CARE INSTRUCTIONS  No special instructions are needed after a transfusion. You may find your energy is better. Speak with your caregiver about any limitations on activity for underlying diseases you may have. SEEK MEDICAL CARE IF:   Your condition is not improving after your transfusion.  You develop redness or irritation at the intravenous (IV) site. SEEK IMMEDIATE MEDICAL CARE IF:  Any of the following symptoms occur over the next 12 hours:  Shaking chills.  You have a temperature by mouth above 102 F (38.9 C), not controlled by medicine.  Chest, back, or muscle pain.  People around you feel you are not acting correctly or are confused.  Shortness of breath or difficulty breathing.  Dizziness and fainting.  You get a rash or develop hives.  You have a decrease in urine output.  Your urine turns a dark color or changes to pink, red, or brown. Any of the following symptoms occur over the next 10 days:  You have a temperature by mouth above 102 F (38.9 C), not controlled by medicine.  Shortness of breath.  Weakness after normal activity.  The white part of the eye turns yellow (jaundice).  You have a decrease in the amount of urine or are urinating less often.  Your urine turns a dark color or changes to pink, red, or brown. Document Released: 05/22/2000 Document Revised: 08/17/2011 Document Reviewed: 01/09/2008 [facility] Patient Information 2014 [place],  [place].  _______________________________________________________________________

## 2014-01-02 NOTE — Pre-Procedure Instructions (Signed)
01-02-14 1530 Labs viewable in [place]. Note Urinalysis- fax sent by Epic Dr. Wynelle Link office -[phone].

## 2014-01-02 NOTE — Pre-Procedure Instructions (Signed)
01-02-14 EKG 11-16-13 with chart,Echo  11'14,Stress 11'14 Epic. CXR done today, prior 11'14 abnormal-Pulmonary emboli. Clearance notes with chart Dr.Ramaswamy(09-29-13), Dr. Little(11-16-13 ) with chart.

## 2014-01-07 ENCOUNTER — Other Ambulatory Visit: Payer: Self-pay | Admitting: Orthopedic Surgery

## 2014-01-07 NOTE — H&P (Signed)
Brittany Mason. Brittany Mason DOB: 1950-06-14 Single / Language: English / Race: White Female Date of Admission:  01-08-2014 Chief Complaint:  Left Knee Pain History of Present Illness  The patient is a 63 year old female who comes in for a preoperative history and physical. The patient is scheduled for a left total knee arthroplasty to be performed by Dr. Dione Plover. Aluisio, MD at [facility] on 01-08-2014. The patient is a 63 year old female who presents for follow up of their knee. The patient is being followed for their left knee pain. Symptoms reported today include: pain. The patient feels that they are doing well and report their pain level to be 3 / 10. The following medication has been used for pain control: Morphine. The patient has reported improvement of their symptoms with: Cortisone injections. The patient indicates that they have questions or concerns regarding pain.  She has had injections in the past. Unfortunately the cortisone did not help the left knee. The knee is bothering her at all times including at night. It is limiting what she can and can not do. She feels as though the knee is preventing her from doing activities that she desires. She is having a hard time sleeping at times.  She would like to get the knee fixed at this time. They have been treated conservatively in the past for the above stated problem and despite conservative measures, they continue to have progressive pain and severe functional limitations and dysfunction. They have failed non-operative management including home exercise, medications. It is felt that they would benefit from undergoing total joint replacement. Risks and benefits of the procedure have been discussed with the patient and they elect to proceed with surgery. There are no active contraindications to surgery such as ongoing infection or rapidly progressive neurological disease.  Allergies  Erythromycin Estolate  - Unknown RXN Codeine/Codeine  Derivatives- Vomiting. She is able to take Hydrocodone.  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 mother Diabetes Mellitus brother Heart Disease father, brother and grandfather mothers side Cancer grandfather fathers side  Social History Number of flights of stairs before winded 2-3 Tobacco / smoke exposure no Marital status single Pain Contract no Drug/Alcohol Rehab (Currently) no Current work status working full time Tobacco use Never smoker. never smoker Illicit drug use no Exercise Exercises monthly; does other Drug/Alcohol Rehab (Previously) no Children 0 Alcohol use current drinker; drinks hard liquor; only occasionally per week  Medication History Xarelto (20MG  Tablet, Oral) Active. Synthroid (150MCG Tablet, Oral) Active. MS Contin (30MG  Tablet ER, Oral) Active. Venlafaxine HCl ER (37.5MG  Capsule ER 24HR, Oral) Active. Lovaza (1GM Capsule, Oral) Active. Simvastatin (40MG  Tablet, Oral) Active. Fenofibrate (160MG  Tablet, Oral) Active. ROPINIRole HCl (0.5MG  Tablet, Oral) Active. NexIUM (20MG  Packet, Oral) Active. Relpax (40MG  Tablet, Oral) Active. Ondansetron HCl (8MG  Tablet, Oral) Active. Lexapro (20MG  Tablet, Oral) Active. Flexeril (10MG  Tablet, Oral) Active.  Past Surgical History Colon Polyp Removal - Colonoscopy Hysterectomy Date: 03/1997. complete (non-cancerous) Left Knee - ACL, Lateral Release Date: 11/1974. Left Knee - ACL Surgery  [Insert Date into Details]. around 1980 or 1981   Review of Systems General Not Present- Chills, Fatigue,  Fever, Memory Loss, Night Sweats, Weight Gain and Weight Loss. Skin Not Present- Eczema, Hives, Itching, Lesions and Rash. HEENT Present- Headache. Not Present- Dentures, Double Vision, Hearing Loss, Tinnitus and Visual Loss. Respiratory Not Present- Allergies, Chronic Cough, Coughing up blood, Shortness of breath at rest and Shortness of breath with exertion. Cardiovascular Not Present- Chest Pain, Difficulty Breathing Lying Down, Murmur, Palpitations, Racing/skipping heartbeats and Swelling. Gastrointestinal Present- Constipation. Not Present- Abdominal Pain, Bloody Stool, Diarrhea, Difficulty Swallowing, Heartburn, Jaundice, Loss of appetitie, Nausea and Vomiting. Female Genitourinary Not Present- Blood in Urine, Discharge, Flank Pain, Incontinence, Painful Urination, Urgency, Urinary frequency, Urinary Retention, Urinating at Night and Weak urinary stream. Musculoskeletal Present- Back Pain and Joint Pain. Not Present- Joint Swelling, Morning Stiffness, Muscle Pain, Muscle Weakness and Spasms. Neurological Not Present- Blackout spells, Difficulty with balance, Dizziness, Paralysis, Tremor and Weakness. Psychiatric Not Present- Insomnia.   Vitals Height: 63in Height was reported by patient. Pulse: 64 (Regular)  BP: 110/62 (Sitting, Right Arm, Standard)    Physical Exam General Mental Status -Alert, cooperative and good historian. General Appearance-pleasant, Not in acute distress. Orientation-Oriented X3. Build & Nutrition-Well nourished and Well developed.  Head and Neck Head-normocephalic, atraumatic . Neck Global Assessment - supple, no bruit auscultated on the right, no bruit auscultated on the left.  Eye Pupil - Bilateral-Regular and Round. Motion - Bilateral-EOMI.  Chest and Lung Exam Auscultation Breath sounds - clear at anterior chest wall  and clear at posterior chest wall. Adventitious sounds - No Adventitious sounds.  Cardiovascular Auscultation Rhythm - Regular rate and rhythm. Heart Sounds - S1 WNL and S2 WNL. Murmurs & Other Heart Sounds - Auscultation of the heart reveals - No Murmurs.  Abdomen Palpation/Percussion Tenderness - Abdomen is non-tender to palpation. Rigidity (guarding) - Abdomen is soft. Auscultation Auscultation of the abdomen reveals - Bowel sounds normal.  Female Genitourinary Note: Not done, not pertinent to present illness   Musculoskeletal Note: On exam she is alert and oriented in no apparent distress. Her left hip shows normal range of motion with no discomfort. Left knee  Left knee with varus deformity. Range 5-120 degrees. Marked crepitus on range of motion. Tenderness medial greater than lateral. No instability noted.   Assessment & Plan Osteoarthritis of left knee (715.96  M17.9) Impression: Left Knee Note:Plan is for a Left Total Knee Replacement by Dr. Wynelle Link.  Plan is to go home.  PCP - Dr. Hulan Fess - Patient has been seen preoperatively and felt to be stable for surgery. Pulm. - Dr. Chase Caller - Patient has been seen preoperatively and felt to be stable for surgery. The patient was instructed to stop Xarelto 48 hours prior to surgery but Dr. Chase Caller wants the patinet to take a 81 mg Baby Aspirin the two day she is off the Xarelto for bridge coverage.  The patient will receive topical TXA (tranexamic acid) due to: Pulmonary Embolus  Please note that the patient has had nausea and vomiting with general anesthesia in the past.  Signed electronically by Ok Edwards, III PA-C

## 2014-01-08 ENCOUNTER — Encounter (HOSPITAL_COMMUNITY): Payer: 59 | Admitting: Anesthesiology

## 2014-01-08 ENCOUNTER — Encounter (HOSPITAL_COMMUNITY)
Admission: RE | Disposition: A | Discharge status: Home Health | Payer: Self-pay | Source: Ambulatory Visit | Attending: Orthopedic Surgery

## 2014-01-08 ENCOUNTER — Inpatient Hospital Stay (HOSPITAL_COMMUNITY): Payer: 59 | Admitting: Anesthesiology

## 2014-01-08 ENCOUNTER — Inpatient Hospital Stay (HOSPITAL_COMMUNITY)
Admission: RE | Admit: 2014-01-08 | Discharge: 2014-01-10 | DRG: 470 | Disposition: A | Discharge status: Home Health | Payer: 59 | Source: Ambulatory Visit | Attending: Orthopedic Surgery | Admitting: Orthopedic Surgery

## 2014-01-08 ENCOUNTER — Encounter (HOSPITAL_COMMUNITY): Payer: Self-pay | Admitting: *Deleted

## 2014-01-08 DIAGNOSIS — Z860100 Personal history of colon polyps, unspecified: Secondary | ICD-10-CM

## 2014-01-08 DIAGNOSIS — Z96652 Presence of left artificial knee joint: Secondary | ICD-10-CM

## 2014-01-08 DIAGNOSIS — M1712 Unilateral primary osteoarthritis, left knee: Secondary | ICD-10-CM

## 2014-01-08 DIAGNOSIS — Z823 Family history of stroke: Secondary | ICD-10-CM

## 2014-01-08 DIAGNOSIS — M1710 Unilateral primary osteoarthritis, unspecified knee: Principal | ICD-10-CM | POA: Diagnosis present

## 2014-01-08 DIAGNOSIS — Z833 Family history of diabetes mellitus: Secondary | ICD-10-CM

## 2014-01-08 DIAGNOSIS — Z6832 Body mass index (BMI) 32.0-32.9, adult: Secondary | ICD-10-CM

## 2014-01-08 DIAGNOSIS — E039 Hypothyroidism, unspecified: Secondary | ICD-10-CM | POA: Diagnosis present

## 2014-01-08 DIAGNOSIS — Z86718 Personal history of other venous thrombosis and embolism: Secondary | ICD-10-CM

## 2014-01-08 DIAGNOSIS — M179 Osteoarthritis of knee, unspecified: Secondary | ICD-10-CM | POA: Diagnosis present

## 2014-01-08 DIAGNOSIS — E7800 Pure hypercholesterolemia, unspecified: Secondary | ICD-10-CM | POA: Diagnosis present

## 2014-01-08 DIAGNOSIS — Z8249 Family history of ischemic heart disease and other diseases of the circulatory system: Secondary | ICD-10-CM

## 2014-01-08 DIAGNOSIS — Z79899 Other long term (current) drug therapy: Secondary | ICD-10-CM

## 2014-01-08 DIAGNOSIS — Z86711 Personal history of pulmonary embolism: Secondary | ICD-10-CM

## 2014-01-08 HISTORY — PX: TOTAL KNEE ARTHROPLASTY: SHX125

## 2014-01-08 LAB — TYPE AND SCREEN
ABO/RH(D): A POS
Antibody Screen: NEGATIVE

## 2014-01-08 SURGERY — ARTHROPLASTY, KNEE, TOTAL
Anesthesia: Spinal | Site: Knee | Laterality: Left

## 2014-01-08 MED ORDER — LACTATED RINGERS IV SOLN
INTRAVENOUS | Status: DC | PRN
  Administered 2014-01-08 (×2): via INTRAVENOUS

## 2014-01-08 MED ORDER — KETOROLAC TROMETHAMINE 15 MG/ML IJ SOLN
7.5000 mg | Freq: Four times a day (QID) | INTRAMUSCULAR | Status: AC | PRN
  Administered 2014-01-08: 7.5 mg via INTRAVENOUS
  Filled 2014-01-08: qty 1

## 2014-01-08 MED ORDER — PROPOFOL 10 MG/ML IV BOLUS
INTRAVENOUS | Status: AC
  Filled 2014-01-08: qty 20

## 2014-01-08 MED ORDER — FENOFIBRATE 160 MG PO TABS
160.0000 mg | ORAL_TABLET | Freq: Every day | ORAL | Status: DC
  Administered 2014-01-08 – 2014-01-09 (×2): 160 mg via ORAL
  Filled 2014-01-08 (×3): qty 1

## 2014-01-08 MED ORDER — 0.9 % SODIUM CHLORIDE (POUR BTL) OPTIME
TOPICAL | Status: DC | PRN
  Administered 2014-01-08: 1000 mL

## 2014-01-08 MED ORDER — HYDROMORPHONE HCL 2 MG PO TABS
2.0000 mg | ORAL_TABLET | ORAL | Status: DC | PRN
  Administered 2014-01-08: 2 mg via ORAL
  Administered 2014-01-09 – 2014-01-10 (×7): 4 mg via ORAL
  Filled 2014-01-08 (×2): qty 2
  Filled 2014-01-08: qty 1
  Filled 2014-01-08 (×7): qty 2

## 2014-01-08 MED ORDER — CEFAZOLIN SODIUM-DEXTROSE 2-3 GM-% IV SOLR
INTRAVENOUS | Status: AC
  Filled 2014-01-08: qty 50

## 2014-01-08 MED ORDER — ROPINIROLE HCL 0.5 MG PO TABS
0.5000 mg | ORAL_TABLET | Freq: Every evening | ORAL | Status: DC
  Administered 2014-01-08 – 2014-01-09 (×2): 0.5 mg via ORAL
  Filled 2014-01-08 (×3): qty 1

## 2014-01-08 MED ORDER — CEFAZOLIN SODIUM-DEXTROSE 2-3 GM-% IV SOLR
2.0000 g | INTRAVENOUS | Status: AC
  Administered 2014-01-08: 2 g via INTRAVENOUS

## 2014-01-08 MED ORDER — SODIUM CHLORIDE 0.9 % IV SOLN: INTRAVENOUS | Status: DC

## 2014-01-08 MED ORDER — FENTANYL CITRATE 0.05 MG/ML IJ SOLN
INTRAMUSCULAR | Status: AC
  Filled 2014-01-08: qty 2

## 2014-01-08 MED ORDER — DEXAMETHASONE SODIUM PHOSPHATE 10 MG/ML IJ SOLN
10.0000 mg | Freq: Every day | INTRAMUSCULAR | Status: AC
  Filled 2014-01-08: qty 1

## 2014-01-08 MED ORDER — METHOCARBAMOL 1000 MG/10ML IJ SOLN
500.0000 mg | Freq: Four times a day (QID) | INTRAVENOUS | Status: DC | PRN
  Administered 2014-01-08: 500 mg via INTRAVENOUS
  Filled 2014-01-08: qty 5

## 2014-01-08 MED ORDER — HYDROMORPHONE HCL PF 1 MG/ML IJ SOLN: 0.2500 mg | INTRAMUSCULAR | Status: DC | PRN

## 2014-01-08 MED ORDER — SODIUM CHLORIDE 0.9 % IJ SOLN
INTRAMUSCULAR | Status: AC
  Filled 2014-01-08: qty 50

## 2014-01-08 MED ORDER — BUPIVACAINE HCL 0.25 % IJ SOLN
INTRAMUSCULAR | Status: DC | PRN
  Administered 2014-01-08: 30 mL

## 2014-01-08 MED ORDER — CHLORHEXIDINE GLUCONATE 4 % EX LIQD: 60.0000 mL | Freq: Once | CUTANEOUS | Status: DC

## 2014-01-08 MED ORDER — MIDAZOLAM HCL 5 MG/5ML IJ SOLN
INTRAMUSCULAR | Status: DC | PRN
  Administered 2014-01-08: 2 mg via INTRAVENOUS

## 2014-01-08 MED ORDER — ACETAMINOPHEN 10 MG/ML IV SOLN
INTRAVENOUS | Status: DC | PRN
  Administered 2014-01-08: 1000 mg via INTRAVENOUS

## 2014-01-08 MED ORDER — HYDROMORPHONE HCL PF 1 MG/ML IJ SOLN
0.5000 mg | INTRAMUSCULAR | Status: DC | PRN
  Administered 2014-01-08 – 2014-01-09 (×4): 1 mg via INTRAVENOUS
  Filled 2014-01-08 (×4): qty 1

## 2014-01-08 MED ORDER — LACTATED RINGERS IV SOLN: INTRAVENOUS | Status: DC

## 2014-01-08 MED ORDER — PROPOFOL INFUSION 10 MG/ML OPTIME
INTRAVENOUS | Status: DC | PRN
  Administered 2014-01-08: 75 ug/kg/min via INTRAVENOUS

## 2014-01-08 MED ORDER — POLYETHYLENE GLYCOL 3350 17 G PO PACK: 17.0000 g | PACK | Freq: Every day | ORAL | Status: DC | PRN

## 2014-01-08 MED ORDER — SODIUM CHLORIDE 0.9 % IJ SOLN
INTRAMUSCULAR | Status: DC | PRN
  Administered 2014-01-08: 30 mL via INTRAVENOUS

## 2014-01-08 MED ORDER — DEXAMETHASONE SODIUM PHOSPHATE 10 MG/ML IJ SOLN
INTRAMUSCULAR | Status: DC | PRN
  Administered 2014-01-08: 10 mg via INTRAVENOUS

## 2014-01-08 MED ORDER — ACETAMINOPHEN 325 MG PO TABS
650.0000 mg | ORAL_TABLET | Freq: Four times a day (QID) | ORAL | Status: DC | PRN
  Administered 2014-01-09: 650 mg via ORAL
  Filled 2014-01-08: qty 2

## 2014-01-08 MED ORDER — DEXAMETHASONE SODIUM PHOSPHATE 10 MG/ML IJ SOLN
INTRAMUSCULAR | Status: AC
  Filled 2014-01-08: qty 1

## 2014-01-08 MED ORDER — MORPHINE SULFATE 15 MG PO TABS
30.0000 mg | ORAL_TABLET | Freq: Two times a day (BID) | ORAL | Status: DC
  Administered 2014-01-08 – 2014-01-10 (×4): 30 mg via ORAL
  Filled 2014-01-08 (×4): qty 2

## 2014-01-08 MED ORDER — MIDAZOLAM HCL 2 MG/2ML IJ SOLN
INTRAMUSCULAR | Status: AC
  Filled 2014-01-08: qty 2

## 2014-01-08 MED ORDER — ONDANSETRON HCL 4 MG/2ML IJ SOLN
INTRAMUSCULAR | Status: AC
  Filled 2014-01-08: qty 2

## 2014-01-08 MED ORDER — ONDANSETRON HCL 4 MG/2ML IJ SOLN
INTRAMUSCULAR | Status: DC | PRN
  Administered 2014-01-08: 4 mg via INTRAVENOUS

## 2014-01-08 MED ORDER — RIVAROXABAN 10 MG PO TABS
10.0000 mg | ORAL_TABLET | Freq: Every day | ORAL | Status: DC
  Administered 2014-01-09 – 2014-01-10 (×2): 10 mg via ORAL
  Filled 2014-01-08 (×3): qty 1

## 2014-01-08 MED ORDER — TRANEXAMIC ACID 100 MG/ML IV SOLN
2000.0000 mg | Freq: Once | INTRAVENOUS | Status: DC
  Filled 2014-01-08: qty 20

## 2014-01-08 MED ORDER — FLEET ENEMA 7-19 GM/118ML RE ENEM: 1.0000 | ENEMA | Freq: Once | RECTAL | Status: AC | PRN

## 2014-01-08 MED ORDER — DOCUSATE SODIUM 100 MG PO CAPS
100.0000 mg | ORAL_CAPSULE | Freq: Two times a day (BID) | ORAL | Status: DC
  Administered 2014-01-08 – 2014-01-10 (×5): 100 mg via ORAL

## 2014-01-08 MED ORDER — DEXAMETHASONE SODIUM PHOSPHATE 10 MG/ML IJ SOLN: 10.0000 mg | Freq: Once | INTRAMUSCULAR | Status: DC

## 2014-01-08 MED ORDER — SODIUM CHLORIDE 0.9 % IV SOLN
1000.0000 mg | INTRAVENOUS | Status: DC | PRN
  Administered 2014-01-08: 1000 mg via INTRAVENOUS

## 2014-01-08 MED ORDER — METHOCARBAMOL 500 MG PO TABS
500.0000 mg | ORAL_TABLET | Freq: Four times a day (QID) | ORAL | Status: DC | PRN
  Administered 2014-01-09 (×2): 500 mg via ORAL
  Filled 2014-01-08 (×2): qty 1

## 2014-01-08 MED ORDER — PROPOFOL 10 MG/ML IV BOLUS
INTRAVENOUS | Status: DC | PRN
  Administered 2014-01-08: 40 mg via INTRAVENOUS

## 2014-01-08 MED ORDER — PHENYLEPHRINE HCL 10 MG/ML IJ SOLN
INTRAMUSCULAR | Status: AC
  Filled 2014-01-08: qty 1

## 2014-01-08 MED ORDER — FENTANYL CITRATE 0.05 MG/ML IJ SOLN
INTRAMUSCULAR | Status: DC | PRN
  Administered 2014-01-08: 50 ug via INTRAVENOUS

## 2014-01-08 MED ORDER — ELETRIPTAN HYDROBROMIDE 40 MG PO TABS
40.0000 mg | ORAL_TABLET | ORAL | Status: DC | PRN
  Filled 2014-01-08: qty 1

## 2014-01-08 MED ORDER — ACETAMINOPHEN 500 MG PO TABS
1000.0000 mg | ORAL_TABLET | Freq: Four times a day (QID) | ORAL | Status: AC
  Administered 2014-01-08 – 2014-01-09 (×4): 1000 mg via ORAL
  Filled 2014-01-08 (×4): qty 2

## 2014-01-08 MED ORDER — PHENYLEPHRINE HCL 10 MG/ML IJ SOLN
10.0000 mg | INTRAVENOUS | Status: DC | PRN
  Administered 2014-01-08: 40 ug/min via INTRAVENOUS

## 2014-01-08 MED ORDER — SODIUM CHLORIDE 0.9 % IV SOLN
INTRAVENOUS | Status: DC
  Administered 2014-01-08 – 2014-01-09 (×2): via INTRAVENOUS

## 2014-01-08 MED ORDER — ONDANSETRON HCL 4 MG/2ML IJ SOLN: 4.0000 mg | Freq: Four times a day (QID) | INTRAMUSCULAR | Status: DC | PRN

## 2014-01-08 MED ORDER — BUPIVACAINE LIPOSOME 1.3 % IJ SUSP
INTRAMUSCULAR | Status: DC | PRN
  Administered 2014-01-08: 20 mL

## 2014-01-08 MED ORDER — BUPIVACAINE HCL (PF) 0.25 % IJ SOLN
INTRAMUSCULAR | Status: AC
  Filled 2014-01-08: qty 30

## 2014-01-08 MED ORDER — BUPIVACAINE IN DEXTROSE 0.75-8.25 % IT SOLN
INTRATHECAL | Status: DC | PRN
  Administered 2014-01-08: 1.8 mL via INTRATHECAL

## 2014-01-08 MED ORDER — SODIUM CHLORIDE 0.9 % IR SOLN
Status: DC | PRN
  Administered 2014-01-08: 1000 mL

## 2014-01-08 MED ORDER — ACETAMINOPHEN 650 MG RE SUPP: 650.0000 mg | Freq: Four times a day (QID) | RECTAL | Status: DC | PRN

## 2014-01-08 MED ORDER — CEFAZOLIN SODIUM-DEXTROSE 2-3 GM-% IV SOLR
2.0000 g | Freq: Four times a day (QID) | INTRAVENOUS | Status: AC
  Administered 2014-01-08 (×2): 2 g via INTRAVENOUS
  Filled 2014-01-08 (×2): qty 50

## 2014-01-08 MED ORDER — BUPIVACAINE LIPOSOME 1.3 % IJ SUSP
20.0000 mL | Freq: Once | INTRAMUSCULAR | Status: DC
  Filled 2014-01-08: qty 20

## 2014-01-08 MED ORDER — SIMVASTATIN 40 MG PO TABS
40.0000 mg | ORAL_TABLET | Freq: Every evening | ORAL | Status: DC
  Administered 2014-01-08 – 2014-01-09 (×2): 40 mg via ORAL
  Filled 2014-01-08 (×3): qty 1

## 2014-01-08 MED ORDER — ONDANSETRON HCL 4 MG PO TABS: 4.0000 mg | ORAL_TABLET | Freq: Four times a day (QID) | ORAL | Status: DC | PRN

## 2014-01-08 MED ORDER — LEVOTHYROXINE SODIUM 150 MCG PO TABS
150.0000 ug | ORAL_TABLET | Freq: Every day | ORAL | Status: DC
  Administered 2014-01-09 – 2014-01-10 (×2): 150 ug via ORAL
  Filled 2014-01-08 (×3): qty 1

## 2014-01-08 MED ORDER — METOCLOPRAMIDE HCL 5 MG/ML IJ SOLN: 5.0000 mg | Freq: Three times a day (TID) | INTRAMUSCULAR | Status: DC | PRN

## 2014-01-08 MED ORDER — BISACODYL 10 MG RE SUPP: 10.0000 mg | Freq: Every day | RECTAL | Status: DC | PRN

## 2014-01-08 MED ORDER — PROMETHAZINE HCL 25 MG/ML IJ SOLN: 6.2500 mg | INTRAMUSCULAR | Status: DC | PRN

## 2014-01-08 MED ORDER — DIPHENHYDRAMINE HCL 12.5 MG/5ML PO ELIX: 12.5000 mg | ORAL_SOLUTION | ORAL | Status: DC | PRN

## 2014-01-08 MED ORDER — METOCLOPRAMIDE HCL 5 MG PO TABS
5.0000 mg | ORAL_TABLET | Freq: Three times a day (TID) | ORAL | Status: DC | PRN
  Filled 2014-01-08: qty 2

## 2014-01-08 MED ORDER — PHENOL 1.4 % MT LIQD
1.0000 | OROMUCOSAL | Status: DC | PRN
  Filled 2014-01-08: qty 177

## 2014-01-08 MED ORDER — DEXAMETHASONE 6 MG PO TABS
10.0000 mg | ORAL_TABLET | Freq: Every day | ORAL | Status: AC
  Administered 2014-01-09: 10 mg via ORAL
  Filled 2014-01-08: qty 1

## 2014-01-08 MED ORDER — ESCITALOPRAM OXALATE 20 MG PO TABS
20.0000 mg | ORAL_TABLET | Freq: Every morning | ORAL | Status: DC
  Administered 2014-01-09 – 2014-01-10 (×2): 20 mg via ORAL
  Filled 2014-01-08 (×2): qty 1

## 2014-01-08 MED ORDER — ACETAMINOPHEN 10 MG/ML IV SOLN
1000.0000 mg | Freq: Once | INTRAVENOUS | Status: DC
  Filled 2014-01-08: qty 100

## 2014-01-08 MED ORDER — MENTHOL 3 MG MT LOZG
1.0000 | LOZENGE | OROMUCOSAL | Status: DC | PRN
  Filled 2014-01-08: qty 9

## 2014-01-08 SURGICAL SUPPLY — 63 items
BAG SPEC THK2 15X12 ZIP CLS (MISCELLANEOUS) ×1
BAG ZIPLOCK 12X15 (MISCELLANEOUS) ×2 IMPLANT
BANDAGE ELASTIC 6 VELCRO ST LF (GAUZE/BANDAGES/DRESSINGS) ×2 IMPLANT
BANDAGE ESMARK 6X9 LF (GAUZE/BANDAGES/DRESSINGS) ×1 IMPLANT
BLADE SAG 18X100X1.27 (BLADE) ×2 IMPLANT
BLADE SAW SGTL 11.0X1.19X90.0M (BLADE) ×2 IMPLANT
BNDG CMPR 9X6 STRL LF SNTH (GAUZE/BANDAGES/DRESSINGS) ×1
BNDG ESMARK 6X9 LF (GAUZE/BANDAGES/DRESSINGS) ×2
BOWL SMART MIX CTS (DISPOSABLE) ×2 IMPLANT
CAP KNEE ATTUNE RP ×1 IMPLANT
CEMENT HV SMART SET (Cement) ×4 IMPLANT
CUFF TOURN SGL QUICK 34 (TOURNIQUET CUFF) ×2
CUFF TRNQT CYL 34X4X40X1 (TOURNIQUET CUFF) ×1 IMPLANT
DECANTER SPIKE VIAL GLASS SM (MISCELLANEOUS) ×2 IMPLANT
DRAPE EXTREMITY T 121X128X90 (DRAPE) ×2 IMPLANT
DRAPE POUCH INSTRU U-SHP 10X18 (DRAPES) ×2 IMPLANT
DRAPE U-SHAPE 47X51 STRL (DRAPES) ×2 IMPLANT
DRSG ADAPTIC 3X8 NADH LF (GAUZE/BANDAGES/DRESSINGS) ×2 IMPLANT
DRSG PAD ABDOMINAL 8X10 ST (GAUZE/BANDAGES/DRESSINGS) ×2 IMPLANT
DURAPREP 26ML APPLICATOR (WOUND CARE) ×2 IMPLANT
ELECT REM PT RETURN 9FT ADLT (ELECTROSURGICAL) ×2
ELECTRODE REM PT RTRN 9FT ADLT (ELECTROSURGICAL) ×1 IMPLANT
EVACUATOR 1/8 PVC DRAIN (DRAIN) ×2 IMPLANT
FACESHIELD WRAPAROUND (MASK) ×10 IMPLANT
FACESHIELD WRAPAROUND OR TEAM (MASK) ×5 IMPLANT
GAUZE SPONGE 4X4 12PLY STRL (GAUZE/BANDAGES/DRESSINGS) ×2 IMPLANT
GLOVE BIO SURGEON STRL SZ7.5 (GLOVE) IMPLANT
GLOVE BIO SURGEON STRL SZ8 (GLOVE) ×2 IMPLANT
GLOVE BIOGEL PI IND STRL 6.5 (GLOVE) IMPLANT
GLOVE BIOGEL PI IND STRL 8 (GLOVE) ×1 IMPLANT
GLOVE BIOGEL PI INDICATOR 6.5 (GLOVE)
GLOVE BIOGEL PI INDICATOR 8 (GLOVE) ×1
GLOVE SURG SS PI 6.5 STRL IVOR (GLOVE) IMPLANT
GOWN STRL REUS W/TWL LRG LVL3 (GOWN DISPOSABLE) ×2 IMPLANT
GOWN STRL REUS W/TWL XL LVL3 (GOWN DISPOSABLE) IMPLANT
HANDPIECE INTERPULSE COAX TIP (DISPOSABLE) ×2
IMMOBILIZER KNEE 20 (SOFTGOODS) ×3 IMPLANT
IMMOBILIZER KNEE 20 THIGH 36 (SOFTGOODS) ×1 IMPLANT
KIT BASIN OR (CUSTOM PROCEDURE TRAY) ×2 IMPLANT
MANIFOLD NEPTUNE II (INSTRUMENTS) ×2 IMPLANT
NDL SAFETY ECLIPSE 18X1.5 (NEEDLE) ×2 IMPLANT
NEEDLE HYPO 18GX1.5 SHARP (NEEDLE) ×4
NS IRRIG 1000ML POUR BTL (IV SOLUTION) ×2 IMPLANT
PACK TOTAL JOINT (CUSTOM PROCEDURE TRAY) ×2 IMPLANT
PAD ABD 8X10 STRL (GAUZE/BANDAGES/DRESSINGS) ×1 IMPLANT
PADDING CAST ABS 6INX4YD NS (CAST SUPPLIES) ×1
PADDING CAST ABS COTTON 6X4 NS (CAST SUPPLIES) IMPLANT
PADDING CAST COTTON 6X4 STRL (CAST SUPPLIES) ×4 IMPLANT
POSITIONER SURGICAL ARM (MISCELLANEOUS) ×2 IMPLANT
SET HNDPC FAN SPRY TIP SCT (DISPOSABLE) ×1 IMPLANT
STRIP CLOSURE SKIN 1/2X4 (GAUZE/BANDAGES/DRESSINGS) ×3 IMPLANT
SUCTION FRAZIER 12FR DISP (SUCTIONS) ×2 IMPLANT
SUT MNCRL AB 4-0 PS2 18 (SUTURE) ×2 IMPLANT
SUT VIC AB 2-0 CT1 27 (SUTURE) ×6
SUT VIC AB 2-0 CT1 TAPERPNT 27 (SUTURE) ×3 IMPLANT
SUT VLOC 180 0 24IN GS25 (SUTURE) ×2 IMPLANT
SYRINGE 20CC LL (MISCELLANEOUS) ×2 IMPLANT
SYRINGE 60CC LL (MISCELLANEOUS) ×2 IMPLANT
TOWEL OR 17X26 10 PK STRL BLUE (TOWEL DISPOSABLE) ×2 IMPLANT
TOWEL OR NON WOVEN STRL DISP B (DISPOSABLE) IMPLANT
TRAY FOLEY CATH 14FRSI W/METER (CATHETERS) ×2 IMPLANT
WATER STERILE IRR 1500ML POUR (IV SOLUTION) ×2 IMPLANT
WRAP KNEE MAXI GEL POST OP (GAUZE/BANDAGES/DRESSINGS) ×2 IMPLANT

## 2014-01-08 NOTE — Progress Notes (Signed)
CPM to be applied on floor.

## 2014-01-08 NOTE — Anesthesia Preprocedure Evaluation (Addendum)
Anesthesia Evaluation  Patient identified by MRN, date of birth, ID band  Reviewed: Allergy & Precautions, H&P , NPO status , Patient's Chart, lab work & pertinent test results  History of Anesthesia Complications (+) PONV and history of anesthetic complications  Airway Mallampati: III TM Distance: >3 FB Neck ROM: Full    Dental  (+) Teeth Intact, Dental Advisory Given   Pulmonary shortness of breath and with exertion,  breath sounds clear to auscultation        Cardiovascular DVT Rhythm:Regular Rate:Normal  Hx of PE on Xarelto- Last dose 01/05/14   Neuro/Psych  Headaches, negative psych ROS   GI/Hepatic negative GI ROS, Neg liver ROS,   Endo/Other  Hypothyroidism   Renal/GU negative Renal ROS  negative genitourinary   Musculoskeletal negative musculoskeletal ROS (+)   Abdominal   Peds  Hematology negative hematology ROS (+)   Anesthesia Other Findings   Reproductive/Obstetrics negative OB ROS                         Anesthesia Physical Anesthesia Plan  ASA: III  Anesthesia Plan: Spinal   Post-op Pain Management:    Induction: Intravenous  Airway Management Planned: Simple Face Mask  Additional Equipment: None  Intra-op Plan:   Post-operative Plan:   Informed Consent: I have reviewed the patients History and Physical, chart, labs and discussed the procedure including the risks, benefits and alternatives for the proposed anesthesia with the patient or authorized representative who has indicated his/her understanding and acceptance.     Plan Discussed with: CRNA and Surgeon  Anesthesia Plan Comments:       Anesthesia Quick Evaluation

## 2014-01-08 NOTE — Transfer of Care (Signed)
Immediate Anesthesia Transfer of Care Note  Patient: Brittany Mason  Procedure(s) Performed: Procedure(s) (LRB): LEFT TOTAL KNEE ARTHROPLASTY (Left)  Patient Location: PACU  Anesthesia Type: Spinal  Level of Consciousness: sedated, patient cooperative and responds to stimulation  Airway & Oxygen Therapy: Patient Spontanous Breathing and Patient connected to face mask oxgen  Post-op Assessment: Report given to PACU RN and Post -op Vital signs reviewed and stable  Post vital signs: Reviewed and stable  Complications: No apparent anesthesia complications. T-12 level on exam, denied pain on assessment.

## 2014-01-08 NOTE — Anesthesia Procedure Notes (Addendum)
Spinal  Patient location during procedure: OR Start time: 01/08/2014 8:14 AM End time: 01/08/2014 8:18 AM Staffing Anesthesiologist: Tiajuana Amass CRNA/Resident: Anne Fu Performed by: resident/CRNA  Preanesthetic Checklist Completed: patient identified, site marked, surgical consent, pre-op evaluation, timeout performed, IV checked, risks and benefits discussed and monitors and equipment checked Spinal Block Patient position: sitting Prep: ChloraPrep Patient monitoring: heart rate, continuous pulse ox and blood pressure Approach: right paramedian Location: L2-3 Injection technique: single-shot Needle Needle type: [place]  Needle gauge: 25 G Needle length: 9 cm Assessment Sensory level: T6 Additional Notes Expiration date of kit checked and confirmed. Patient tolerated procedure well, without complications.  X 1 attempt with noted clear CSF return easy aspiration and administration.  No noted bleeding post placement, loss of motor and sensory on exam.

## 2014-01-08 NOTE — Anesthesia Postprocedure Evaluation (Signed)
  Anesthesia Post-op Note  Patient: Brittany Mason  Procedure(s) Performed: Procedure(s): LEFT TOTAL KNEE ARTHROPLASTY (Left)  Patient Location: PACU  Anesthesia Type:Spinal  Level of Consciousness: awake, alert  and oriented  Airway and Oxygen Therapy: Patient Spontanous Breathing  Post-op Pain: none  Post-op Assessment: Post-op Vital signs reviewed. Spinal resolving.  Post-op Vital Signs: Reviewed  Last Vitals:  Filed Vitals:   01/08/14 1119  BP: 134/84  Pulse: 78  Temp: 36.6 C  Resp: 12    Complications: No apparent anesthesia complications

## 2014-01-08 NOTE — Evaluation (Signed)
Physical Therapy Evaluation Patient Details Name: Brittany Mason MRN: [id number] DOB: Feb 21, 1951 Today's Date: 01/08/2014   History of Present Illness  63 yo female s/p L TKA 01/08/14.   Clinical Impression  On eval POD 0, pt required Min assist for mobility-able to ambulate ~40 feet with rolling walker. Anticipate pt will progress well during stay. Recommend HHPT, RW.     Follow Up Recommendations Home health PT    Equipment Recommendations  Rolling walker with 5" wheels    Recommendations for Other Services OT consult     Precautions / Restrictions Precautions Precautions: Knee Required Braces or Orthoses: Knee Immobilizer - Left Knee Immobilizer - Left: Discontinue once straight leg raise with < 10 degree lag Restrictions Weight Bearing Restrictions: No LLE Weight Bearing: Weight bearing as tolerated      Mobility  Bed Mobility Overal bed mobility: Needs Assistance Bed Mobility: Supine to Sit;Sit to Supine     Supine to sit: Min assist Sit to supine: Min assist   General bed mobility comments: Assist for L LE  Transfers Overall transfer level: Needs assistance Equipment used: Rolling walker (2 wheeled) Transfers: Sit to/from Stand Sit to Stand: Min assist;From elevated surface         General transfer comment: Assist to rise, stabilize, control descent. VCS safety, technique, hand placement.   Ambulation/Gait Ambulation/Gait assistance: Min assist Ambulation Distance (Feet): 40 Feet Assistive device: Rolling walker (2 wheeled) Gait Pattern/deviations: Step-to pattern;Decreased stride length;Antalgic     General Gait Details: slow gait speed. VCs safety, technique, sequence. assist to stabilize  Stairs            Wheelchair Mobility    Modified Rankin (Stroke Patients Only)       Balance                                             Pertinent Vitals/Pain L knee 5/10 with activity. Ice applied end of session    Home  Living Family/patient expects to be discharged to:: Private residence     Type of Home: House Home Access: Stairs to enter Entrance Stairs-Rails: None Technical brewer of Steps: 3 Home Layout: One level Home Equipment: None      Prior Function Level of Independence: Independent               Hand Dominance        Extremity/Trunk Assessment   Upper Extremity Assessment: Overall WFL for tasks assessed           Lower Extremity Assessment: LLE deficits/detail   LLE Deficits / Details: hip flex 3-/5, hip abd/add 2/5, moves ankle well  Cervical / Trunk Assessment: Normal  Communication   Communication: No difficulties  Cognition Arousal/Alertness: Awake/alert Behavior During Therapy: WFL for tasks assessed/performed Overall Cognitive Status: Within Functional Limits for tasks assessed                      General Comments      Exercises        Assessment/Plan    PT Assessment Patient needs continued PT services  PT Diagnosis Difficulty walking;Generalized weakness;Acute pain   PT Problem List Decreased strength;Decreased range of motion;Decreased activity tolerance;Decreased balance;Decreased mobility;Pain;Decreased knowledge of use of DME;Decreased knowledge of precautions  PT Treatment Interventions DME instruction;Gait training;Stair training;Functional mobility training;Therapeutic activities;Balance training;Patient/family education   PT Goals (Current goals can  be found in the Care Plan section) Acute Rehab PT Goals Patient Stated Goal: home PT Goal Formulation: With patient Time For Goal Achievement: 01/15/14 Potential to Achieve Goals: Good    Frequency 7X/week   Barriers to discharge        Co-evaluation               End of Session Equipment Utilized During Treatment: Gait belt Activity Tolerance: Patient tolerated treatment well Patient left: in bed;with call bell/phone within reach;with family/visitor present            Time: 8757-9728 PT Time Calculation (min): 28 min   Charges:   PT Evaluation $Initial PT Evaluation Tier I: 1 Procedure PT Treatments $Gait Training: 8-22 mins $Therapeutic Activity: 8-22 mins   PT G Codes:          Weston Anna, MPT Pager: [phone]

## 2014-01-08 NOTE — Progress Notes (Signed)
Spinal level L3- O.K. To go to floor- moving tops of legs -per Dr. Ola Spurr

## 2014-01-08 NOTE — Addendum Note (Signed)
Addendum created 01/08/14 1335 by Anne Fu, CRNA   Modules edited: Anesthesia Medication Administration

## 2014-01-08 NOTE — Op Note (Signed)
Pre-operative diagnosis- Osteoarthritis  Left knee(s)  Post-operative diagnosis- Osteoarthritis Left knee(s)  Procedure-  Left  Total Knee Arthroplasty (Attune system)  Surgeon- Brittany Mason. Brittany Veiga, MD  Assistant- Arlee Muslim, PA-C   Anesthesia-  Spinal  EBL-* No blood loss amount entered *   Drains Hemovac  Tourniquet time-  37 minutes @ 409 mm Hg  Complications- None  Condition-PACU - hemodynamically stable.   Brief Clinical Note  Brittany Mason is a 63 y.o. year old female with end stage OA of her left knee with progressively worsening pain and dysfunction. She has constant pain, with activity and at rest and significant functional deficits with difficulties even with ADLs. She has had extensive non-op management including analgesics, injections of cortisone and viscosupplements, and home exercise program, but remains in significant pain with significant dysfunction. Radiographs show bone on bone arthritis medial and patellofemoral. She presents now for left Total Knee Arthroplasty.    Procedure in detail---   The patient is brought into the operating room and positioned supine on the operating table. After successful administration of  Spinal,   a tourniquet is placed high on the  Left thigh(s) and the lower extremity is prepped and draped in the usual sterile fashion. Time out is performed by the operating team and then the  Left lower extremity is wrapped in Esmarch, knee flexed and the tourniquet inflated to 300 mmHg.       A midline incision is made with a ten blade through the subcutaneous tissue to the level of the extensor mechanism. A fresh blade is used to make a medial parapatellar arthrotomy. Soft tissue over the proximal medial tibia is subperiosteally elevated to the joint line with a knife and into the semimembranosus bursa with a Cobb elevator. Soft tissue over the proximal lateral tibia is elevated with attention being paid to avoiding the patellar tendon on the tibial  tubercle. The patella is everted, knee flexed 90 degrees and the ACL and PCL are removed. Findings are bone on bone medial and patellofemoral with large medial osteophytes.        The drill is used to create a starting hole in the distal femur and the canal is thoroughly irrigated with sterile saline to remove the fatty contents. The 5 degree Left  valgus alignment guide is placed into the femoral canal and the distal femoral cutting block is pinned to remove 9 mm off the distal femur. Resection is made with an oscillating saw.      The tibia is subluxed forward and the menisci are removed. The extramedullary alignment guide is placed referencing proximally at the medial aspect of the tibial tubercle and distally along the second metatarsal axis and tibial crest. The block is pinned to remove 64mm off the more deficient medial  side. Resection is made with an oscillating saw. Size 4is the most appropriate size for the tibia and the proximal tibia is prepared with the modular drill and keel punch for that size.      The femoral sizing guide is placed and size 5 is most appropriate. Rotation is marked off the epicondylar axis and confirmed by creating a rectangular flexion gap at 90 degrees. The size 5 cutting block is pinned in this rotation and the anterior, posterior and chamfer cuts are made with the oscillating saw. The intercondylar block is then placed and that cut is made.      Trial size 4 tibial component, trial size 5 posterior stabilized femur and a 6  mm  posterior stabilized rotating platform insert trial is placed. Full extension is achieved with excellent varus/valgus and anterior/posterior balance throughout full range of motion. The patella is everted and thickness measured to be 22  mm. Free hand resection is taken to 12 mm, a 35 template is placed, lug holes are drilled, trial patella is placed, and it tracks normally. Osteophytes are removed off the posterior femur with the trial in place. All  trials are removed and the cut bone surfaces prepared with pulsatile lavage. Cement is mixed and once ready for implantation, the size 4 tibial implant, size  5 narrow posterior stabilized femoral component, and the size 35 patella are cemented in place and the patella is held with the clamp. The trial insert is placed and the knee held in full extension. The Exparel (20 ml mixed with 30 ml saline) and .25% Bupivicaine, are injected into the extensor mechanism, posterior capsule, medial and lateral gutters and subcutaneous tissues.  All extruded cement is removed and once the cement is hard the permanent 6 mm posterior stabilized rotating platform insert is placed into the tibial tray.      The wound is copiously irrigated with saline solution and the extensor mechanism closed over a hemovac drain with #1 V-loc suture. The tourniquet is released for a total tourniquet time of 37  minutes. Flexion against gravity is 135 degrees and the patella tracks normally. Subcutaneous tissue is closed with 2.0 vicryl and subcuticular with running 4.0 Monocryl. The incision is cleaned and dried and steri-strips and a bulky sterile dressing are applied. The limb is placed into a knee immobilizer and the patient is awakened and transported to recovery in stable condition.      Please note that a surgical assistant was a medical necessity for this procedure in order to perform it in a safe and expeditious manner. Surgical assistant was necessary to retract the ligaments and vital neurovascular structures to prevent injury to them and also necessary for proper positioning of the limb to allow for anatomic placement of the prosthesis.   Brittany Mason [name], MD    01/08/2014, 9:21 AM

## 2014-01-08 NOTE — H&P (View-Only) (Signed)
Brittany Mason DOB: 21-Jan-1951 Single / Language: English / Race: White Female Date of Admission:  01-08-2014 Chief Complaint:  Left Knee Pain History of Present Illness  The patient is a 63 year old female who comes in for a preoperative history and physical. The patient is scheduled for a left total knee arthroplasty to be performed by Dr. Dione Plover. Aluisio, MD at [facility] on 01-08-2014. The patient is a 63 year old female who presents for follow up of their knee. The patient is being followed for their left knee pain. Symptoms reported today include: pain. The patient feels that they are doing well and report their pain level to be 3 / 10. The following medication has been used for pain control: Morphine. The patient has reported improvement of their symptoms with: Cortisone injections. The patient indicates that they have questions or concerns regarding pain.  She has had injections in the past. Unfortunately the cortisone did not help the left knee. The knee is bothering her at all times including at night. It is limiting what she can and can not do. She feels as though the knee is preventing her from doing activities that she desires. She is having a hard time sleeping at times.  She would like to get the knee fixed at this time. They have been treated conservatively in the past for the above stated problem and despite conservative measures, they continue to have progressive pain and severe functional limitations and dysfunction. They have failed non-operative management including home exercise, medications. It is felt that they would benefit from undergoing total joint replacement. Risks and benefits of the procedure have been discussed with the patient and they elect to proceed with surgery. There are no active contraindications to surgery such as ongoing infection or rapidly progressive neurological disease.  Allergies  Erythromycin Estolate  - Unknown RXN Codeine/Codeine  Derivatives- Vomiting. She is able to take Hydrocodone.  Problem List/Past Medical Primary osteoarthritis of left knee (715.16  M17.12) Osteoarthritis of left knee (715.96  M17.9) Knee pain (719.46  M25.569) Hypercholesterolemia Hypothyroidism Migraine Headache Osteoarthritis Chronic Pain Heart murmur Mitral Valve Prolapse Pulmonary Embolism - Attributed to HRT Menopause  Family History Cerebrovascular Accident brother Heart disease in female family member before age 37 Osteoarthritis mother Hypertension father Congestive Heart Failure First Degree Relatives. mother Diabetes Mellitus brother Heart Disease father, brother and grandfather mothers side Cancer grandfather fathers side  Social History Number of flights of stairs before winded 2-3 Tobacco / smoke exposure no Marital status single Pain Contract no Drug/Alcohol Rehab (Currently) no Current work status working full time Tobacco use Never smoker. never smoker Illicit drug use no Exercise Exercises monthly; does other Drug/Alcohol Rehab (Previously) no Children 0 Alcohol use current drinker; drinks hard liquor; only occasionally per week  Medication History Xarelto (20MG  Tablet, Oral) Active. Synthroid (150MCG Tablet, Oral) Active. MS Contin (30MG  Tablet ER, Oral) Active. Venlafaxine HCl ER (37.5MG  Capsule ER 24HR, Oral) Active. Lovaza (1GM Capsule, Oral) Active. Simvastatin (40MG  Tablet, Oral) Active. Fenofibrate (160MG  Tablet, Oral) Active. ROPINIRole HCl (0.5MG  Tablet, Oral) Active. NexIUM (20MG  Packet, Oral) Active. Relpax (40MG  Tablet, Oral) Active. Ondansetron HCl (8MG  Tablet, Oral) Active. Lexapro (20MG  Tablet, Oral) Active. Flexeril (10MG  Tablet, Oral) Active.  Past Surgical History Colon Polyp Removal - Colonoscopy Hysterectomy Date: 03/1997. complete (non-cancerous) Left Knee - ACL, Lateral Release Date: 11/1974. Left Knee - ACL Surgery  [Insert Date into Details]. around 1980 or 1981   Review of Systems General Not Present- Chills, Fatigue,  Fever, Memory Loss, Night Sweats, Weight Gain and Weight Loss. Skin Not Present- Eczema, Hives, Itching, Lesions and Rash. HEENT Present- Headache. Not Present- Dentures, Double Vision, Hearing Loss, Tinnitus and Visual Loss. Respiratory Not Present- Allergies, Chronic Cough, Coughing up blood, Shortness of breath at rest and Shortness of breath with exertion. Cardiovascular Not Present- Chest Pain, Difficulty Breathing Lying Down, Murmur, Palpitations, Racing/skipping heartbeats and Swelling. Gastrointestinal Present- Constipation. Not Present- Abdominal Pain, Bloody Stool, Diarrhea, Difficulty Swallowing, Heartburn, Jaundice, Loss of appetitie, Nausea and Vomiting. Female Genitourinary Not Present- Blood in Urine, Discharge, Flank Pain, Incontinence, Painful Urination, Urgency, Urinary frequency, Urinary Retention, Urinating at Night and Weak urinary stream. Musculoskeletal Present- Back Pain and Joint Pain. Not Present- Joint Swelling, Morning Stiffness, Muscle Pain, Muscle Weakness and Spasms. Neurological Not Present- Blackout spells, Difficulty with balance, Dizziness, Paralysis, Tremor and Weakness. Psychiatric Not Present- Insomnia.   Vitals Height: 63in Height was reported by patient. Pulse: 64 (Regular)  BP: 110/62 (Sitting, Right Arm, Standard)    Physical Exam General Mental Status -Alert, cooperative and good historian. General Appearance-pleasant, Not in acute distress. Orientation-Oriented X3. Build & Nutrition-Well nourished and Well developed.  Head and Neck Head-normocephalic, atraumatic . Neck Global Assessment - supple, no bruit auscultated on the right, no bruit auscultated on the left.  Eye Pupil - Bilateral-Regular and Round. Motion - Bilateral-EOMI.  Chest and Lung Exam Auscultation Breath sounds - clear at anterior chest wall  and clear at posterior chest wall. Adventitious sounds - No Adventitious sounds.  Cardiovascular Auscultation Rhythm - Regular rate and rhythm. Heart Sounds - S1 WNL and S2 WNL. Murmurs & Other Heart Sounds - Auscultation of the heart reveals - No Murmurs.  Abdomen Palpation/Percussion Tenderness - Abdomen is non-tender to palpation. Rigidity (guarding) - Abdomen is soft. Auscultation Auscultation of the abdomen reveals - Bowel sounds normal.  Female Genitourinary Note: Not done, not pertinent to present illness   Musculoskeletal Note: On exam she is alert and oriented in no apparent distress. Her left hip shows normal range of motion with no discomfort. Left knee  Left knee with varus deformity. Range 5-120 degrees. Marked crepitus on range of motion. Tenderness medial greater than lateral. No instability noted.   Assessment & Plan Osteoarthritis of left knee (715.96  M17.9) Impression: Left Knee Note:Plan is for a Left Total Knee Replacement by Dr. Wynelle Link.  Plan is to go home.  PCP - Dr. Hulan Fess - Patient has been seen preoperatively and felt to be stable for surgery. Pulm. - Dr. Chase Caller - Patient has been seen preoperatively and felt to be stable for surgery. The patient was instructed to stop Xarelto 48 hours prior to surgery but Dr. Chase Caller wants the patinet to take a 81 mg Baby Aspirin the two day she is off the Xarelto for bridge coverage.  The patient will receive topical TXA (tranexamic acid) due to: Pulmonary Embolus  Please note that the patient has had nausea and vomiting with general anesthesia in the past.  Signed electronically by Ok Edwards, III PA-C

## 2014-01-08 NOTE — Interval H&P Note (Signed)
History and Physical Interval Note:  01/08/2014 6:56 AM  Brittany Mason  has presented today for surgery, with the diagnosis of left knee osteoarthritis  The various methods of treatment have been discussed with the patient and family. After consideration of risks, benefits and other options for treatment, the patient has consented to  Procedure(s): LEFT TOTAL KNEE ARTHROPLASTY (Left) as a surgical intervention .  [name] history has been reviewed, patient examined, no change in status, stable for surgery.  I have reviewed the patient's chart and labs.  Questions were answered to the patient's satisfaction.     Gearlean Alf

## 2014-01-09 LAB — CBC
HEMATOCRIT: 32.9 % — AB (ref 36.0–46.0)
Hemoglobin: 10.9 g/dL — ABNORMAL LOW (ref 12.0–15.0)
MCH: 28.2 pg (ref 26.0–34.0)
MCHC: 33.1 g/dL (ref 30.0–36.0)
MCV: 85.2 fL (ref 78.0–100.0)
Platelets: 255 10*3/uL (ref 150–400)
RBC: 3.86 MIL/uL — ABNORMAL LOW (ref 3.87–5.11)
RDW: 13.3 % (ref 11.5–15.5)
WBC: 10.3 10*3/uL (ref 4.0–10.5)

## 2014-01-09 LAB — BASIC METABOLIC PANEL
Anion gap: 9 (ref 5–15)
BUN: 10 mg/dL (ref 6–23)
CO2: 26 mEq/L (ref 19–32)
Calcium: 8.8 mg/dL (ref 8.4–10.5)
Chloride: 104 mEq/L (ref 96–112)
Creatinine, Ser: 0.76 mg/dL (ref 0.50–1.10)
GFR, EST NON AFRICAN AMERICAN: 90 mL/min — AB (ref >90)
Glucose, Bld: 127 mg/dL — ABNORMAL HIGH (ref 70–99)
POTASSIUM: 4.1 meq/L (ref 3.7–5.3)
Sodium: 139 mEq/L (ref 137–147)

## 2014-01-09 MED ORDER — ALUM & MAG HYDROXIDE-SIMETH 200-200-20 MG/5ML PO SUSP
30.0000 mL | ORAL | Status: DC | PRN
  Administered 2014-01-09: 30 mL via ORAL
  Filled 2014-01-09: qty 30

## 2014-01-09 NOTE — Progress Notes (Signed)
[facility]   [facility] is providing the following services: RW and Commode  If patient discharges after hours, please call [phone].   Linward Headland 01/09/2014, 9:46 AM

## 2014-01-09 NOTE — Progress Notes (Signed)
   Subjective: 1 Day Post-Op Procedure(s) (LRB): LEFT TOTAL KNEE ARTHROPLASTY (Left) Patient reports pain as mild.   Patient seen in rounds with Dr. Wynelle Link. Patient is well, and has had no acute complaints or problems Got up with therapy yesterday and walked 40 feet. Plan is to go Home after hospital stay.  Objective: Vital signs in last 24 hours: Temp:  [97.4 F (36.3 C)-98.6 F (37 C)] 98 F (36.7 C) (08/04 0642) Pulse Rate:  [67-91] 86 (08/04 0642) Resp:  [10-18] 18 (08/04 0749) BP: (104-142)/(61-86) 134/66 mmHg (08/04 0642) SpO2:  [92 %-100 %] 95 % (08/04 0642) Weight:  [84.369 kg (186 lb)] 84.369 kg (186 lb) (08/03 1119)  Intake/Output from previous day:  Intake/Output Summary (Last 24 hours) at 01/09/14 0819 Last data filed at 01/09/14 0644  Gross per 24 hour  Intake 5282.5 ml  Output   6315 ml  Net -1032.5 ml    Intake/Output this shift:    Labs:  Recent Labs  01/09/14 0420  HGB 10.9*    Recent Labs  01/09/14 0420  WBC 10.3  RBC 3.86*  HCT 32.9*  PLT 255    Recent Labs  01/09/14 0420  NA 139  K 4.1  CL 104  CO2 26  BUN 10  CREATININE 0.76  GLUCOSE 127*  CALCIUM 8.8   No results found for this basename: LABPT, INR,  in the last 72 hours  EXAM General - Patient is Alert, Appropriate and Oriented Extremity - Neurovascular intact Sensation intact distally Dorsiflexion/Plantar flexion intact Dressing - dressing C/D/I Motor Function - intact, moving foot and toes well on exam.  Hemovac pulled without difficulty.  Past Medical History  Diagnosis Date  . Hypothyroidism   . Hypercholesterolemia   . RLS (restless legs syndrome)   . PONV (postoperative nausea and vomiting)   . Chronic back pain   . MVP (mitral valve prolapse)     asymptomatic  . Dyspnea on exertion   . PE (pulmonary embolism) Apr 18, 2013    tx. -using Xarelto now, no further lung problems, denies SOB on 01-02-14  . Headache(784.0)     migraines, 1-2 every 2 months    . Arthritis     osteoarthritis-knees. Chronic back pain  . DVT (deep venous thrombosis)     01-02-14 hx. left lower leg, resulted in Pulmonary emboli    Assessment/Plan: 1 Day Post-Op Procedure(s) (LRB): LEFT TOTAL KNEE ARTHROPLASTY (Left) Principal Problem:   OA (osteoarthritis) of knee  Estimated body mass index is 32.96 kg/(m^2) as calculated from the following:   Height as of this encounter: 5\' 3"  (1.6 m).   Weight as of this encounter: 84.369 kg (186 lb). Advance diet Up with therapy Plan for discharge tomorrow Discharge home with home health  DVT Prophylaxis - Xarelto Weight-Bearing as tolerated to left leg D/C O2 and Pulse OX and try on Room Air  Arlee Muslim, PA-C Orthopaedic Surgery 01/09/2014, 8:19 AM

## 2014-01-09 NOTE — Progress Notes (Signed)
Physical Therapy Treatment Patient Details Name: Brittany Mason MRN: [id number] DOB: Sep 20, 1950 Today's Date: 01/09/2014    History of Present Illness 63 yo female s/p L TKA 01/08/14.     PT Comments    Progressing with mobility.   Follow Up Recommendations  Home health PT     Equipment Recommendations  Rolling walker with 5" wheels    Recommendations for Other Services OT consult     Precautions / Restrictions Precautions Precautions: Knee Required Braces or Orthoses: Knee Immobilizer - Left Knee Immobilizer - Left: Discontinue once straight leg raise with < 10 degree lag Restrictions Weight Bearing Restrictions: No LLE Weight Bearing: Weight bearing as tolerated    Mobility  Bed Mobility Overal bed mobility: Needs Assistance Bed Mobility: Supine to Sit;Sit to Supine     Supine to sit: Min assist Sit to supine: Min assist   General bed mobility comments: Assist for L LE. Practiced on/off high bed.   Transfers Overall transfer level: Needs assistance Equipment used: Rolling walker (2 wheeled) Transfers: Sit to/from Stand Sit to Stand: Min guard         General transfer comment: verbal cues for hand placement and LE management.  Ambulation/Gait Ambulation/Gait assistance: Min guard Ambulation Distance (Feet): 75 Feet Assistive device: Rolling walker (2 wheeled) Gait Pattern/deviations: Step-to pattern;Decreased step length - right;Decreased step length - left;Decreased stride length;Antalgic     General Gait Details: slow gait speed. VCs safety, technique, sequence, step length. clsoe guard for safety   Stairs            Wheelchair Mobility    Modified Rankin (Stroke Patients Only)       Balance                                    Cognition Arousal/Alertness: Awake/alert Behavior During Therapy: WFL for tasks assessed/performed Overall Cognitive Status: Within Functional Limits for tasks assessed                       Exercises      General Comments        Pertinent Vitals/Pain 5/10L knee with activity.     Home Living                      Prior Function            PT Goals (current goals can now be found in the care plan section) Progress towards PT goals: Progressing toward goals    Frequency  7X/week    PT Plan Current plan remains appropriate    Co-evaluation             End of Session   Activity Tolerance: Patient tolerated treatment well Patient left: in bed;with call bell/phone within reach;with family/visitor present     Time: 1341-1405 PT Time Calculation (min): 24 min  Charges:  $Gait Training: 8-22 mins $Therapeutic Activity: 8-22 mins                    G Codes:      Weston Anna, MPT Pager: [phone]

## 2014-01-09 NOTE — Progress Notes (Signed)
Came to visit patient on behalf of [facility] program/[employer] services for [place] employees/dependents with [facility] insurance. Denies having any [facility] needs and pleasantly declines post hospital discharge call. Brochure left at bedside. Marthenia Rolling, MSN- [facility] [phone]

## 2014-01-09 NOTE — Evaluation (Signed)
Occupational Therapy Evaluation Patient Details Name: Brittany Mason MRN: [id number] DOB: 09-18-1950 Today's Date: 01/09/2014    History of Present Illness 63 yo female s/p L TKA 01/08/14.    Clinical Impression   Pt doing well and min assist for up to 3in1. Will have a friend as caregiver at d/c. Will follow while on acute to progress ADL independence.    Follow Up Recommendations  No OT follow up    Equipment Recommendations  3 in 1 bedside comode    Recommendations for Other Services       Precautions / Restrictions Precautions Precautions: Knee Required Braces or Orthoses: Knee Immobilizer - Left Knee Immobilizer - Left: Discontinue once straight leg raise with < 10 degree lag Restrictions Weight Bearing Restrictions: No LLE Weight Bearing: Weight bearing as tolerated      Mobility Bed Mobility             Transfers Overall transfer level: Needs assistance Equipment used: Rolling walker (2 wheeled) Transfers: Sit to/from Stand Sit to Stand: Min assist         General transfer comment: verbal cues for hand placement and LE management.    Balance                                            ADL Overall ADL's : Needs assistance/impaired Eating/Feeding: Independent;Sitting   Grooming: Wash/dry face;Set up;Sitting   Upper Body Bathing: Set up;Sitting   Lower Body Bathing: Moderate assistance;Sit to/from stand   Upper Body Dressing : Set up;Sitting   Lower Body Dressing: Moderate assistance;Sit to/from stand   Toilet Transfer: Minimal assistance;Ambulation;BSC;RW   Toileting- Clothing Manipulation and Hygiene: Minimal assistance;Sit to/from stand         General ADL Comments: Educated briefly on AE options but pt states her friend will assist with LB dressing at d/c. Practiced 3in1 transfers and educated on how to adjust 3in1 for appropriate height. Pt states she has a shower seat but doesnt have handles so explained 3in1 can be  used as shower chair initialy and use armrests. Pt limited by some indigestion discomfort during session and informed nursing.      Vision                     Perception     Praxis      Pertinent Vitals/Pain 4/10 L knee; reposition, ice     Hand Dominance     Extremity/Trunk Assessment Upper Extremity Assessment Upper Extremity Assessment: Overall WFL for tasks assessed           Communication Communication Communication: No difficulties   Cognition Arousal/Alertness: Awake/alert Behavior During Therapy: WFL for tasks assessed/performed Overall Cognitive Status: Within Functional Limits for tasks assessed                     General Comments       Exercises       Shoulder Instructions      Home Living Family/patient expects to be discharged to:: Private residence Living Arrangements: Non-relatives/Friends   Type of Home: House Home Access: Stairs to enter Technical brewer of Steps: 3 Entrance Stairs-Rails: None Home Layout: One level     Bathroom Shower/Tub: Occupational psychologist: Standard     Home Equipment: Shower seat          Prior Functioning/Environment Level  of Independence: Independent             OT Diagnosis: Generalized weakness   OT Problem List: Decreased strength;Decreased knowledge of use of DME or AE   OT Treatment/Interventions: Self-care/ADL training;Patient/family education;Therapeutic activities;DME and/or AE instruction    OT Goals(Current goals can be found in the care plan section) Acute Rehab OT Goals Patient Stated Goal: home OT Goal Formulation: With patient Time For Goal Achievement: 01/16/14 Potential to Achieve Goals: Good  OT Frequency: Min 2X/week   Barriers to D/C:            Co-evaluation              End of Session Equipment Utilized During Treatment: Gait belt;Rolling walker;Left knee immobilizer CPM Left Knee CPM Left Knee: Off  Activity Tolerance:  Patient tolerated treatment well Patient left: in chair;with call bell/phone within reach;with family/visitor present   Time: 7628-3151 OT Time Calculation (min): 28 min Charges:  OT General Charges $OT Visit: 1 Procedure OT Evaluation $Initial OT Evaluation Tier I: 1 Procedure OT Treatments $Self Care/Home Management : 8-22 mins $Therapeutic Activity: 8-22 mins G-Codes:    Jules Schick [phone] 01/09/2014, 11:39 AM

## 2014-01-09 NOTE — Progress Notes (Signed)
Physical Therapy Treatment Patient Details Name: Brittany Mason MRN: [id number] DOB: 12/20/1950 Today's Date: 01/09/2014    History of Present Illness 63 yo female s/p L TKA 01/08/14.     PT Comments    Progressing with mobility. Limited by pain, fatigue.   Follow Up Recommendations  Home health PT     Equipment Recommendations  Rolling walker with 5" wheels    Recommendations for Other Services OT consult     Precautions / Restrictions Precautions Precautions: Knee Required Braces or Orthoses: Knee Immobilizer - Left Knee Immobilizer - Left: Discontinue once straight leg raise with < 10 degree lag Restrictions Weight Bearing Restrictions: No LLE Weight Bearing: Weight bearing as tolerated    Mobility  Bed Mobility Overal bed mobility: Needs Assistance Bed Mobility: Supine to Sit     Supine to sit: Min assist     General bed mobility comments: Assist for L LE  Transfers Overall transfer level: Needs assistance Equipment used: Rolling walker (2 wheeled) Transfers: Sit to/from Stand Sit to Stand: Min assist         General transfer comment: Assist to rise, stabilize, control descent. VCS safety, technique, hand placement.   Ambulation/Gait Ambulation/Gait assistance: Min guard Ambulation Distance (Feet): 60 Feet Assistive device: Rolling walker (2 wheeled) Gait Pattern/deviations: Step-to pattern;Decreased step length - right;Decreased step length - left;Decreased stride length;Antalgic;Trunk flexed     General Gait Details: slow gait speed. VCs safety, technique, sequence, step length. assist to stabilize. 2 brief standing rest breaks. fatigues fairly easily   Stairs            Wheelchair Mobility    Modified Rankin (Stroke Patients Only)       Balance                                    Cognition Arousal/Alertness: Awake/alert Behavior During Therapy: WFL for tasks assessed/performed Overall Cognitive Status: Within  Functional Limits for tasks assessed                      Exercises Total Joint Exercises Ankle Circles/Pumps: AROM;Both;10 reps;Supine Quad Sets: AROM;Both;10 reps;Supine Heel Slides: AAROM;Left;10 reps;Supine Hip ABduction/ADduction: AAROM;Left;10 reps;Supine Straight Leg Raises: AAROM;Left;10 reps;Supine Goniometric ROM: 10-60 degrees    General Comments        Pertinent Vitals/Pain 5/10 L knee with activity. Ice applied end of session    Home Living                      Prior Function            PT Goals (current goals can now be found in the care plan section) Progress towards PT goals: Progressing toward goals    Frequency  7X/week    PT Plan Current plan remains appropriate    Co-evaluation             End of Session Equipment Utilized During Treatment: Gait belt Activity Tolerance: Patient limited by fatigue;Patient limited by pain Patient left: in chair;with call bell/phone within reach;with family/visitor present     Time: 2706-2376 PT Time Calculation (min): 44 min  Charges:  $Gait Training: 23-37 mins $Therapeutic Exercise: 8-22 mins                    G Codes:      Weston Anna, MPT Pager: [phone]

## 2014-01-10 LAB — BASIC METABOLIC PANEL
Anion gap: 11 (ref 5–15)
BUN: 9 mg/dL (ref 6–23)
CALCIUM: 9.7 mg/dL (ref 8.4–10.5)
CO2: 28 meq/L (ref 19–32)
CREATININE: 0.64 mg/dL (ref 0.50–1.10)
Chloride: 100 mEq/L (ref 96–112)
GFR calc Af Amer: >90 mL/min (ref >90)
GFR calc non Af Amer: >90 mL/min (ref >90)
Glucose, Bld: 113 mg/dL — ABNORMAL HIGH (ref 70–99)
Potassium: 4.7 mEq/L (ref 3.7–5.3)
Sodium: 139 mEq/L (ref 137–147)

## 2014-01-10 LAB — CBC
HCT: 36.9 % (ref 36.0–46.0)
Hemoglobin: 12 g/dL (ref 12.0–15.0)
MCH: 28.3 pg (ref 26.0–34.0)
MCHC: 32.5 g/dL (ref 30.0–36.0)
MCV: 87 fL (ref 78.0–100.0)
Platelets: 270 10*3/uL (ref 150–400)
RBC: 4.24 MIL/uL (ref 3.87–5.11)
RDW: 13.6 % (ref 11.5–15.5)
WBC: 10.6 10*3/uL — ABNORMAL HIGH (ref 4.0–10.5)

## 2014-01-10 MED ORDER — HYDROMORPHONE HCL 2 MG PO TABS: 2.0000 mg | ORAL_TABLET | ORAL | Status: DC | PRN

## 2014-01-10 MED ORDER — METHOCARBAMOL 500 MG PO TABS: 500.0000 mg | ORAL_TABLET | Freq: Four times a day (QID) | ORAL | Status: DC | PRN

## 2014-01-10 NOTE — Progress Notes (Addendum)
   Subjective: 2 Days Post-Op Procedure(s) (LRB): LEFT TOTAL KNEE ARTHROPLASTY (Left) Patient reports pain as mild.   Patient seen in rounds with Dr. Wynelle Link. Patient is well, and has had no acute complaints or problems Patient is ready to go home  Objective: Vital signs in last 24 hours: Temp:  [98 F (36.7 C)-99.7 F (37.6 C)] 98 F (36.7 C) (08/05 0507) Pulse Rate:  [73-85] 81 (08/05 0507) Resp:  [16-18] 18 (08/05 0507) BP: (139-164)/(55-78) 139/55 mmHg (08/05 0507) SpO2:  [95 %-100 %] 99 % (08/05 0507)  Intake/Output from previous day:  Intake/Output Summary (Last 24 hours) at 01/10/14 0706 Last data filed at 01/09/14 2017  Gross per 24 hour  Intake    917 ml  Output   2550 ml  Net  -1633 ml    Intake/Output this shift:    Labs:  Recent Labs  01/09/14 0420 01/10/14 0453  HGB 10.9* 12.0    Recent Labs  01/09/14 0420 01/10/14 0453  WBC 10.3 10.6*  RBC 3.86* 4.24  HCT 32.9* 36.9  PLT 255 270    Recent Labs  01/09/14 0420 01/10/14 0453  NA 139 139  K 4.1 4.7  CL 104 100  CO2 26 28  BUN 10 9  CREATININE 0.76 0.64  GLUCOSE 127* 113*  CALCIUM 8.8 9.7   No results found for this basename: LABPT, INR,  in the last 72 hours  EXAM: General - Patient is Alert, Appropriate and Oriented Extremity - Neurovascular intact Sensation intact distally Incision - clean, dry, no drainage, healing Motor Function - intact, moving foot and toes well on exam.   Assessment/Plan: 2 Days Post-Op Procedure(s) (LRB): LEFT TOTAL KNEE ARTHROPLASTY (Left) Procedure(s) (LRB): LEFT TOTAL KNEE ARTHROPLASTY (Left) Past Medical History  Diagnosis Date  . Hypothyroidism   . Hypercholesterolemia   . RLS (restless legs syndrome)   . PONV (postoperative nausea and vomiting)   . Chronic back pain   . MVP (mitral valve prolapse)     asymptomatic  . Dyspnea on exertion   . PE (pulmonary embolism) Apr 18, 2013    tx. -using Xarelto now, no further lung problems, denies  SOB on 01-02-14  . Headache(784.0)     migraines, 1-2 every 2 months  . Arthritis     osteoarthritis-knees. Chronic back pain  . DVT (deep venous thrombosis)     01-02-14 hx. left lower leg, resulted in Pulmonary emboli   Principal Problem:   OA (osteoarthritis) of knee  Estimated body mass index is 32.96 kg/(m^2) as calculated from the following:   Height as of this encounter: 5\' 3"  (1.6 m).   Weight as of this encounter: 84.369 kg (186 lb). Up with therapy Discharge home with home health Diet - Cardiac diet Follow up - in 2 weeks on Tuesday the 18th with Dr. Wynelle Link. Activity - WBAT Disposition - Home Condition Upon Discharge - Good D/C Meds - See DC Summary DVT Prophylaxis - Xarelto  Arlee Muslim, PA-C Orthopaedic Surgery 01/10/2014, 7:06 AM

## 2014-01-10 NOTE — Progress Notes (Signed)
Occupational Therapy Treatment Patient Details Name: Brittany Mason MRN: [id number] DOB: June 12, 1950 Today's Date: 01/10/2014    History of present illness 63 yo female s/p L TKA 01/08/14.    OT comments  Pt progressing nicely toward OT goals.  She requires min guard assist with functional transfers.   Follow Up Recommendations  No OT follow up    Equipment Recommendations  3 in 1 bedside comode    Recommendations for Other Services      Precautions / Restrictions Precautions Precautions: Knee Required Braces or Orthoses: Knee Immobilizer - Left Knee Immobilizer - Left: Discontinue once straight leg raise with < 10 degree lag Restrictions Weight Bearing Restrictions: No LLE Weight Bearing: Weight bearing as tolerated       Mobility Bed Mobility                  Transfers Overall transfer level: Needs assistance Equipment used: Rolling walker (2 wheeled) Transfers: Sit to/from [facility] Sit to Stand: Min guard Stand pivot transfers: Min guard            Balance                                   ADL                           Toilet Transfer: Min guard;Ambulation;BSC   Toileting- Water quality scientist and Hygiene: Sit to/from stand   Tub/ Shower Transfer: Min guard;Ambulation;3 in 1 Tub/Shower Transfer Details (indicate cue type and reason): Pt was instructed in safe technique for shower transfer using 3in1 Functional mobility during ADLs: Min guard;Rolling walker General ADL Comments: caregiver present      Vision                     Perception     Praxis      Cognition   Behavior During Therapy: WFL for tasks assessed/performed Overall Cognitive Status: Within Functional Limits for tasks assessed                       Extremity/Trunk Assessment               Exercises     Shoulder Instructions       General Comments      Pertinent Vitals/ Pain       See vitals flow  sheet.   Home Living                                          Prior Functioning/Environment              Frequency Min 2X/week     Progress Toward Goals  OT Goals(current goals can now be found in the care plan section)  Progress towards OT goals: Progressing toward goals  ADL Goals Pt Will Perform Grooming: with supervision;standing Pt Will Transfer to Toilet: with supervision;ambulating;bedside commode Pt Will Perform Toileting - Clothing Manipulation and hygiene: with supervision;sit to/from stand Pt Will Perform Tub/Shower Transfer: with min guard assist;Shower transfer;3 in 1  Plan Discharge plan remains appropriate    Co-evaluation                 End of Session Equipment Utilized During Treatment: Rolling walker;Left  knee immobilizer   Activity Tolerance Patient tolerated treatment well   Patient Left in chair;with call bell/phone within reach;with family/visitor present   Nurse Communication          Time: 3128-1188 OT Time Calculation (min): 16 min  Charges: OT General Charges $OT Visit: 1 Procedure OT Treatments $Self Care/Home Management : 8-22 mins  Brittany Mason M 01/10/2014, 11:10 AM

## 2014-01-10 NOTE — Discharge Instructions (Signed)

## 2014-01-10 NOTE — Progress Notes (Signed)
Physical Therapy Treatment Patient Details Name: Brittany Mason MRN: [id number] DOB: Mar 26, 1951 Today's Date: 01/10/2014    History of Present Illness 63 yo female s/p L TKA 01/08/14.     PT Comments    Progressing well with mobility. Practiced ambulation, stair negotiation, exercises. Practiced getting off of high bed surface as well. All education completed. Ready to d/c from PT standpoint.   Follow Up Recommendations  Home health PT     Equipment Recommendations  Rolling walker with 5" wheels    Recommendations for Other Services OT consult     Precautions / Restrictions Precautions Precautions: Knee Required Braces or Orthoses: Knee Immobilizer - Left Knee Immobilizer - Left: Discontinue once straight leg raise with < 10 degree lag Restrictions Weight Bearing Restrictions: No LLE Weight Bearing: Weight bearing as tolerated    Mobility  Bed Mobility Overal bed mobility: Needs Assistance Bed Mobility: Supine to Sit;Sit to Supine     Supine to sit: Min assist Sit to supine: Min assist   General bed mobility comments: Assist for L LE. Practiced getting off high bed.   Transfers Overall transfer level: Needs assistance Equipment used: Rolling walker (2 wheeled) Transfers: Sit to/from Stand Sit to Stand: Min guard Stand pivot transfers: Min guard       General transfer comment: verbal cues for hand placement and LE management.  Ambulation/Gait Ambulation/Gait assistance: Min guard Ambulation Distance (Feet): 75 Feet Assistive device: Rolling walker (2 wheeled) Gait Pattern/deviations: Step-to pattern;Decreased stride length;Decreased step length - left;Decreased step length - right;Antalgic     General Gait Details: slow gait speed. VCs safety, technique, sequence, step length. close guard for safety   Stairs Stairs: Yes   Stair Management: Step to pattern;Backwards;Forwards;With walker Number of Stairs: 2 (x2) General stair comments: Practiced 2 steps x2  with RW backwards. Practiced 1 free step with RW forwards. VCS safety, technique, sequence. Friend present to observe and assist with stabilizing walker. Pt able to get up 1 step forwards with walker but not without some difficulty. Encouraged pt to go up backwards if she preferred/felt it was easier.   Wheelchair Mobility    Modified Rankin (Stroke Patients Only)       Balance                                    Cognition Arousal/Alertness: Awake/alert Behavior During Therapy: WFL for tasks assessed/performed Overall Cognitive Status: Within Functional Limits for tasks assessed                      Exercises Total Joint Exercises Ankle Circles/Pumps: AROM;Both;10 reps;Supine Quad Sets: AROM;Both;10 reps;Supine Heel Slides: AAROM;Left;10 reps;Supine Hip ABduction/ADduction: AAROM;Left;10 reps;Supine Straight Leg Raises: AAROM;Left;10 reps;Supine Goniometric ROM: 10-60 degrees    General Comments        Pertinent Vitals/Pain 4/10 L knee with activity. Ice applied end of session    Home Living                      Prior Function            PT Goals (current goals can now be found in the care plan section) Progress towards PT goals: Progressing toward goals    Frequency  7X/week    PT Plan Current plan remains appropriate    Co-evaluation             End of Session Equipment  Utilized During Treatment: Gait belt Activity Tolerance: Patient tolerated treatment well Patient left: in chair;with call bell/phone within reach;with family/visitor present     Time: 9323-5573 PT Time Calculation (min): 39 min  Charges:  $Gait Training: 23-37 mins $Therapeutic Exercise: 8-22 mins                    G Codes:      Weston Anna, MPT Pager: [phone]

## 2014-01-10 NOTE — Discharge Summary (Signed)
Physician Discharge Summary   Patient ID: Brittany Mason MRN: [id number] DOB/AGE: 1950/12/31 63 y.o.  Admit date: 01/08/2014 Discharge date: 01/10/2014  Primary Diagnosis:  Osteoarthritis Left knee(s)  Admission Diagnoses:  Past Medical History  Diagnosis Date  . Hypothyroidism   . Hypercholesterolemia   . RLS (restless legs syndrome)   . PONV (postoperative nausea and vomiting)   . Chronic back pain   . MVP (mitral valve prolapse)     asymptomatic  . Dyspnea on exertion   . PE (pulmonary embolism) Apr 18, 2013    tx. -using Xarelto now, no further lung problems, denies SOB on 01-02-14  . Headache(784.0)     migraines, 1-2 every 2 months  . Arthritis     osteoarthritis-knees. Chronic back pain  . DVT (deep venous thrombosis)     01-02-14 hx. left lower leg, resulted in Pulmonary emboli   Discharge Diagnoses:   Principal Problem:   OA (osteoarthritis) of knee  Estimated body mass index is 32.96 kg/(m^2) as calculated from the following:   Height as of this encounter: _0  (1.6 m).   Weight as of this encounter: 84.369 kg (186 lb).  Procedure:  Procedure(s) (LRB): LEFT TOTAL KNEE ARTHROPLASTY (Left)   Consults: None  HPI: Brittany Mason is a 63 y.o. year old female with end stage OA of her left knee with progressively worsening pain and dysfunction. She has constant pain, with activity and at rest and significant functional deficits with difficulties even with ADLs. She has had extensive non-op management including analgesics, injections of cortisone and viscosupplements, and home exercise program, but remains in significant pain with significant dysfunction. Radiographs show bone on bone arthritis medial and patellofemoral. She presents now for left Total Knee Arthroplasty.   Laboratory Data: Admission on 01/08/2014, Discharged on 01/10/2014  Component Date Value Ref Range Status  . WBC 01/09/2014 10.3  4.0 - 10.5 K/uL Final  . RBC 01/09/2014 3.86* 3.87 - 5.11 MIL/uL Final    . Hemoglobin 01/09/2014 10.9* 12.0 - 15.0 g/dL Final  . HCT 01/09/2014 32.9* 36.0 - 46.0 % Final  . MCV 01/09/2014 85.2  78.0 - 100.0 fL Final  . MCH 01/09/2014 28.2  26.0 - 34.0 pg Final  . MCHC 01/09/2014 33.1  30.0 - 36.0 g/dL Final  . RDW 01/09/2014 13.3  11.5 - 15.5 % Final  . Platelets 01/09/2014 255  150 - 400 K/uL Final  . Sodium 01/09/2014 139  137 - 147 mEq/L Final  . Potassium 01/09/2014 4.1  3.7 - 5.3 mEq/L Final  . Chloride 01/09/2014 104  96 - 112 mEq/L Final  . CO2 01/09/2014 26  19 - 32 mEq/L Final  . Glucose, Bld 01/09/2014 127* 70 - 99 mg/dL Final  . BUN 01/09/2014 10  6 - 23 mg/dL Final  . Creatinine, Ser 01/09/2014 0.76  0.50 - 1.10 mg/dL Final  . Calcium 01/09/2014 8.8  8.4 - 10.5 mg/dL Final  . GFR calc non Af Amer 01/09/2014 90* >90 mL/min Final  . GFR calc Af Amer 01/09/2014 >90  >90 mL/min Final   Comment: (NOTE)                          The eGFR has been calculated using the CKD EPI equation.                          This calculation has not been validated in all clinical situations.  eGFR's persistently <90 mL/min signify possible Chronic Kidney                          Disease.  . Anion gap 01/09/2014 9  5 - 15 Final  . WBC 01/10/2014 10.6* 4.0 - 10.5 K/uL Final  . RBC 01/10/2014 4.24  3.87 - 5.11 MIL/uL Final  . Hemoglobin 01/10/2014 12.0  12.0 - 15.0 g/dL Final  . HCT 01/10/2014 36.9  36.0 - 46.0 % Final  . MCV 01/10/2014 87.0  78.0 - 100.0 fL Final  . MCH 01/10/2014 28.3  26.0 - 34.0 pg Final  . MCHC 01/10/2014 32.5  30.0 - 36.0 g/dL Final  . RDW 01/10/2014 13.6  11.5 - 15.5 % Final  . Platelets 01/10/2014 270  150 - 400 K/uL Final  . Sodium 01/10/2014 139  137 - 147 mEq/L Final  . Potassium 01/10/2014 4.7  3.7 - 5.3 mEq/L Final  . Chloride 01/10/2014 100  96 - 112 mEq/L Final  . CO2 01/10/2014 28  19 - 32 mEq/L Final  . Glucose, Bld 01/10/2014 113* 70 - 99 mg/dL Final  . BUN 01/10/2014 9  6 - 23 mg/dL Final  . Creatinine,  Ser 01/10/2014 0.64  0.50 - 1.10 mg/dL Final  . Calcium 01/10/2014 9.7  8.4 - 10.5 mg/dL Final  . GFR calc non Af Amer 01/10/2014 >90  >90 mL/min Final  . GFR calc Af Amer 01/10/2014 >90  >90 mL/min Final   Comment: (NOTE)                          The eGFR has been calculated using the CKD EPI equation.                          This calculation has not been validated in all clinical situations.                          eGFR's persistently <90 mL/min signify possible Chronic Kidney                          Disease.  Georgiann Hahn gap 01/10/2014 11  5 - 15 Final  Hospital Outpatient Visit on 01/02/2014  Component Date Value Ref Range Status  . MRSA, PCR 01/02/2014 NEGATIVE  NEGATIVE Final  . Staphylococcus aureus 01/02/2014 NEGATIVE  NEGATIVE Final   Comment:                                 The Xpert SA Assay (FDA                          approved for NASAL specimens                          in patients over 63 years of age),                          is one component of                          a comprehensive surveillance  program.  Test performance has                          been validated by [facility] for patients greater                          than or equal to 35 year old.                          It is not intended                          to diagnose infection nor to                          guide or monitor treatment.  Marland Kitchen aPTT 01/02/2014 32  24 - 37 seconds Final  . WBC 01/02/2014 5.2  4.0 - 10.5 K/uL Final  . RBC 01/02/2014 4.69  3.87 - 5.11 MIL/uL Final  . Hemoglobin 01/02/2014 13.7  12.0 - 15.0 g/dL Final  . HCT 01/02/2014 40.3  36.0 - 46.0 % Final  . MCV 01/02/2014 85.9  78.0 - 100.0 fL Final  . MCH 01/02/2014 29.2  26.0 - 34.0 pg Final  . MCHC 01/02/2014 34.0  30.0 - 36.0 g/dL Final  . RDW 01/02/2014 13.2  11.5 - 15.5 % Final  . Platelets 01/02/2014 281  150 - 400 K/uL Final  . Sodium 01/02/2014 137  137 - 147 mEq/L Final    . Potassium 01/02/2014 4.3  3.7 - 5.3 mEq/L Final  . Chloride 01/02/2014 98  96 - 112 mEq/L Final  . CO2 01/02/2014 23  19 - 32 mEq/L Final  . Glucose, Bld 01/02/2014 113* 70 - 99 mg/dL Final  . BUN 01/02/2014 15  6 - 23 mg/dL Final  . Creatinine, Ser 01/02/2014 0.73  0.50 - 1.10 mg/dL Final  . Calcium 01/02/2014 9.9  8.4 - 10.5 mg/dL Final  . Total Protein 01/02/2014 7.6  6.0 - 8.3 g/dL Final  . Albumin 01/02/2014 4.5  3.5 - 5.2 g/dL Final  . AST 01/02/2014 35  0 - 37 U/L Final  . ALT 01/02/2014 28  0 - 35 U/L Final  . Alkaline Phosphatase 01/02/2014 41  39 - 117 U/L Final  . Total Bilirubin 01/02/2014 0.3  0.3 - 1.2 mg/dL Final  . GFR calc non Af Amer 01/02/2014 >90  >90 mL/min Final  . GFR calc Af Amer 01/02/2014 >90  >90 mL/min Final   Comment: (NOTE)                          The eGFR has been calculated using the CKD EPI equation.                          This calculation has not been validated in all clinical situations.                          eGFR's persistently <90 mL/min signify possible Chronic Kidney  Disease.  . Anion gap 01/02/2014 16* 5 - 15 Final  . Prothrombin Time 01/02/2014 14.8  11.6 - 15.2 seconds Final  . INR 01/02/2014 1.16  0.00 - 1.49 Final  . Color, Urine 01/02/2014 YELLOW  YELLOW Final  . APPearance 01/02/2014 CLEAR  CLEAR Final  . Specific Gravity, Urine 01/02/2014 1.006  1.005 - 1.030 Final  . pH 01/02/2014 7.0  5.0 - 8.0 Final  . Glucose, UA 01/02/2014 NEGATIVE  NEGATIVE mg/dL Final  . Hgb urine dipstick 01/02/2014 NEGATIVE  NEGATIVE Final  . Bilirubin Urine 01/02/2014 NEGATIVE  NEGATIVE Final  . Ketones, ur 01/02/2014 NEGATIVE  NEGATIVE mg/dL Final  . Protein, ur 01/02/2014 NEGATIVE  NEGATIVE mg/dL Final  . Urobilinogen, UA 01/02/2014 0.2  0.0 - 1.0 mg/dL Final  . Nitrite 01/02/2014 NEGATIVE  NEGATIVE Final  . Leukocytes, UA 01/02/2014 MODERATE* NEGATIVE Final  . ABO/RH(D) 01/02/2014 A POS   Final  . Antibody Screen  01/02/2014 NEG   Final  . Sample Expiration 01/02/2014 01/11/2014   Final  . ABO/RH(D) 01/02/2014 A POS   Final  . Squamous Epithelial / LPF 01/02/2014 RARE  RARE Final  . WBC, UA 01/02/2014 7-10  <3 WBC/hpf Final  . RBC / HPF 01/02/2014 0-2  <3 RBC/hpf Final  . Bacteria, UA 01/02/2014 RARE  RARE Final     X-Rays:Dg Chest 2 View  01/02/2014   CLINICAL DATA:  History pulmonary emboli.  EXAM: CHEST  2 VIEW  COMPARISON:  CT 04/18/2013.  FINDINGS: Mediastinum and hilar structures are normal. The lungs are clear. Heart size normal. Degenerative changes thoracic spine.  IMPRESSION: No acute cardiopulmonary disease.   Electronically Signed   By: Marcello Moores  Register   On: 01/02/2014 13:34    EKG: Orders placed during the hospital encounter of 04/18/13  . EKG 12-LEAD  . EKG 12-LEAD  . EKG     Hospital Course: Brittany Mason is a 63 y.o. who was admitted to [facility]. They were brought to the operating room on 01/08/2014 and underwent Procedure(s): LEFT TOTAL KNEE ARTHROPLASTY.  Patient tolerated the procedure well and was later transferred to the recovery room and then to the orthopaedic floor for postoperative care.  They were given PO and IV analgesics for pain control following their surgery.  They were given 24 hours of postoperative antibiotics of  Anti-infectives   Start     Dose/Rate Route Frequency Ordered Stop   01/08/14 1400  ceFAZolin (ANCEF) IVPB 2 g/50 mL premix     2 g 100 mL/hr over 30 Minutes Intravenous Every 6 hours 01/08/14 1127 01/08/14 2048   01/08/14 0628  ceFAZolin (ANCEF) IVPB 2 g/50 mL premix     2 g 100 mL/hr over 30 Minutes Intravenous On call to O.R. 01/08/14 3818 01/08/14 0837     and started on DVT prophylaxis in the form of Xarelto.   PT and OT were ordered for total joint protocol.  Discharge planning consulted to help with postop disposition and equipment needs.  Patient had a good night on the evening of surgery.  They started to get up OOB with therapy on  the day of surgery and walked forty feet and then got up again on day one. Hemovac drain was pulled without difficulty.  Continued to work with therapy into day two.  Dressing was changed on day two and the incision was healing well. Patient was seen in rounds and was ready to go home.  Discharge home with home health  Diet -  Cardiac diet  Follow up - in 2 weeks on Tuesday the 18th with Dr. Wynelle Link.  Activity - WBAT  Disposition - Home  Condition Upon Discharge - Good  D/C Meds - See DC Summary  DVT Prophylaxis - Xarelto        Discharge Instructions   Call MD / Call 911    Complete by:  As directed   If you experience chest pain or shortness of breath, CALL 911 and be transported to the hospital emergency room.  If you develope a fever above 101 F, pus (white drainage) or increased drainage or redness at the wound, or calf pain, call your surgeon's office.     Change dressing    Complete by:  As directed   Change dressing daily with sterile 4 x 4 inch gauze dressing and apply TED hose. Do not submerge the incision under water.     Constipation Prevention    Complete by:  As directed   Drink plenty of fluids.  Prune juice may be helpful.  You may use a stool softener, such as Colace (over the counter) 100 mg twice a day.  Use MiraLax (over the counter) for constipation as needed.     Diet - low sodium heart healthy    Complete by:  As directed      Discharge instructions    Complete by:  As directed   Pick up stool softner and laxative for home. Do not submerge incision under water. May shower. Continue to use ice for pain and swelling from surgery.  Resume the 20 mg Xarelto starting Thursday 8/6 at home.     Do not put a pillow under the knee. Place it under the heel.    Complete by:  As directed      Do not sit on low chairs, stoools or toilet seats, as it may be difficult to get up from low surfaces    Complete by:  As directed      Driving restrictions    Complete by:  As  directed   No driving until released by the physician.     Increase activity slowly as tolerated    Complete by:  As directed      Lifting restrictions    Complete by:  As directed   No lifting until released by the physician.     Patient may shower    Complete by:  As directed   You may shower without a dressing once there is no drainage.  Do not wash over the wound.  If drainage remains, do not shower until drainage stops.     TED hose    Complete by:  As directed   Use stockings (TED hose) for 3 weeks on both leg(s).  You may remove them at night for sleeping.     Weight bearing as tolerated    Complete by:  As directed             Medication List    STOP taking these medications       aspirin-acetaminophen-caffeine 250-250-65 MG per tablet  Commonly known as:  EXCEDRIN MIGRAINE     cyclobenzaprine 5 MG tablet  Commonly known as:  FLEXERIL     diclofenac 75 MG EC tablet  Commonly known as:  VOLTAREN     omega-3 acid ethyl esters 1 G capsule  Commonly known as:  LOVAZA     OVER THE COUNTER MEDICATION      TAKE these medications  eletriptan 40 MG tablet  Commonly known as:  RELPAX  Take 40 mg by mouth as needed for migraine. One tablet by mouth at onset of headache. May repeat in 2 hours if headache persists or recurs.     escitalopram 20 MG tablet  Commonly known as:  LEXAPRO  Take 20 mg by mouth every morning.     fenofibrate 160 MG tablet  Take 160 mg by mouth daily. Takes at bedtime     HYDROmorphone 2 MG tablet  Commonly known as:  DILAUDID  Take 1-2 tablets (2-4 mg total) by mouth every 4 (four) hours as needed for moderate pain or severe pain.     levothyroxine 150 MCG tablet  Commonly known as:  SYNTHROID, LEVOTHROID  Take 150 mcg by mouth daily before breakfast.     methocarbamol 500 MG tablet  Commonly known as:  ROBAXIN  Take 1 tablet (500 mg total) by mouth every 6 (six) hours as needed for muscle spasms.     morphine 30 MG tablet    Commonly known as:  MSIR  Take 30 mg by mouth every 12 (twelve) hours.     ondansetron 8 MG disintegrating tablet  Commonly known as:  ZOFRAN-ODT  Take 8 mg by mouth every 8 (eight) hours as needed for nausea.     rivaroxaban 20 MG Tabs tablet  Commonly known as:  XARELTO  Take 20 mg by mouth daily with supper.     rOPINIRole 0.5 MG tablet  Commonly known as:  REQUIP  Take 0.5 mg by mouth every evening.     simvastatin 40 MG tablet  Commonly known as:  ZOCOR  Take 40 mg by mouth every evening.       Follow-up Information   Follow up with Gearlean Alf, MD. Schedule an appointment as soon as possible for a visit on 01/23/2014. (Call office for appointment on Tuesday the 18th.)    Specialty:  Orthopedic Surgery   Contact information:   [address] [place] [address] [phone]       Signed: Arlee Muslim, PA-C Orthopaedic Surgery 01/23/2014, 2:46 PM

## 2014-01-11 NOTE — Addendum Note (Signed)
Addendum created 01/11/14 0855 by Tiajuana Amass, MD   Modules edited: Anesthesia Attestations

## 2014-01-29 ENCOUNTER — Ambulatory Visit: Payer: 59 | Attending: Orthopedic Surgery | Admitting: Physical Therapy

## 2014-01-29 DIAGNOSIS — M25569 Pain in unspecified knee: Secondary | ICD-10-CM | POA: Diagnosis not present

## 2014-01-29 DIAGNOSIS — Z96659 Presence of unspecified artificial knee joint: Secondary | ICD-10-CM | POA: Diagnosis not present

## 2014-01-29 DIAGNOSIS — M25669 Stiffness of unspecified knee, not elsewhere classified: Secondary | ICD-10-CM | POA: Insufficient documentation

## 2014-01-29 DIAGNOSIS — IMO0001 Reserved for inherently not codable concepts without codable children: Secondary | ICD-10-CM | POA: Diagnosis present

## 2014-01-29 DIAGNOSIS — M7989 Other specified soft tissue disorders: Secondary | ICD-10-CM | POA: Diagnosis not present

## 2014-01-29 DIAGNOSIS — R5381 Other malaise: Secondary | ICD-10-CM | POA: Diagnosis not present

## 2014-01-31 ENCOUNTER — Ambulatory Visit: Payer: 59 | Admitting: Physical Therapy

## 2014-01-31 DIAGNOSIS — IMO0001 Reserved for inherently not codable concepts without codable children: Secondary | ICD-10-CM | POA: Diagnosis not present

## 2014-02-02 ENCOUNTER — Ambulatory Visit: Payer: 59 | Admitting: Physical Therapy

## 2014-02-02 DIAGNOSIS — IMO0001 Reserved for inherently not codable concepts without codable children: Secondary | ICD-10-CM | POA: Diagnosis not present

## 2014-02-05 ENCOUNTER — Ambulatory Visit: Payer: 59 | Admitting: Physical Therapy

## 2014-02-05 DIAGNOSIS — IMO0001 Reserved for inherently not codable concepts without codable children: Secondary | ICD-10-CM | POA: Diagnosis not present

## 2014-02-07 ENCOUNTER — Ambulatory Visit: Payer: 59 | Attending: Orthopedic Surgery | Admitting: Physical Therapy

## 2014-02-07 DIAGNOSIS — M25569 Pain in unspecified knee: Secondary | ICD-10-CM | POA: Insufficient documentation

## 2014-02-07 DIAGNOSIS — M25669 Stiffness of unspecified knee, not elsewhere classified: Secondary | ICD-10-CM | POA: Insufficient documentation

## 2014-02-07 DIAGNOSIS — M7989 Other specified soft tissue disorders: Secondary | ICD-10-CM | POA: Diagnosis not present

## 2014-02-07 DIAGNOSIS — IMO0001 Reserved for inherently not codable concepts without codable children: Secondary | ICD-10-CM | POA: Diagnosis present

## 2014-02-07 DIAGNOSIS — R5381 Other malaise: Secondary | ICD-10-CM | POA: Insufficient documentation

## 2014-02-07 DIAGNOSIS — Z96659 Presence of unspecified artificial knee joint: Secondary | ICD-10-CM | POA: Diagnosis not present

## 2014-02-09 ENCOUNTER — Ambulatory Visit: Payer: 59 | Admitting: Physical Therapy

## 2014-02-09 DIAGNOSIS — IMO0001 Reserved for inherently not codable concepts without codable children: Secondary | ICD-10-CM | POA: Diagnosis not present

## 2014-02-13 ENCOUNTER — Ambulatory Visit: Payer: 59 | Admitting: Physical Therapy

## 2014-02-13 DIAGNOSIS — IMO0001 Reserved for inherently not codable concepts without codable children: Secondary | ICD-10-CM | POA: Diagnosis not present

## 2014-02-15 ENCOUNTER — Ambulatory Visit: Payer: 59 | Admitting: Physical Therapy

## 2014-02-15 DIAGNOSIS — IMO0001 Reserved for inherently not codable concepts without codable children: Secondary | ICD-10-CM | POA: Diagnosis not present

## 2014-02-19 ENCOUNTER — Ambulatory Visit: Payer: 59 | Admitting: Physical Therapy

## 2014-02-19 DIAGNOSIS — IMO0001 Reserved for inherently not codable concepts without codable children: Secondary | ICD-10-CM | POA: Diagnosis not present

## 2014-02-21 ENCOUNTER — Encounter: Payer: 59 | Admitting: Physical Therapy

## 2014-02-23 ENCOUNTER — Ambulatory Visit: Payer: 59 | Admitting: Physical Therapy

## 2014-02-23 DIAGNOSIS — IMO0001 Reserved for inherently not codable concepts without codable children: Secondary | ICD-10-CM | POA: Diagnosis not present

## 2014-02-26 ENCOUNTER — Ambulatory Visit: Payer: 59 | Admitting: Physical Therapy

## 2014-02-26 DIAGNOSIS — IMO0001 Reserved for inherently not codable concepts without codable children: Secondary | ICD-10-CM | POA: Diagnosis not present

## 2014-02-28 ENCOUNTER — Encounter: Payer: 59 | Admitting: Physical Therapy

## 2014-03-01 ENCOUNTER — Ambulatory Visit: Payer: 59 | Admitting: Physical Therapy

## 2014-03-02 ENCOUNTER — Encounter: Payer: 59 | Admitting: Physical Therapy

## 2014-03-05 ENCOUNTER — Ambulatory Visit: Payer: 59 | Admitting: Physical Therapy

## 2014-03-05 DIAGNOSIS — IMO0001 Reserved for inherently not codable concepts without codable children: Secondary | ICD-10-CM | POA: Diagnosis not present

## 2014-03-07 ENCOUNTER — Ambulatory Visit: Payer: 59 | Admitting: Physical Therapy

## 2014-03-08 ENCOUNTER — Ambulatory Visit: Payer: 59 | Admitting: Physical Therapy

## 2014-03-08 DIAGNOSIS — M7989 Other specified soft tissue disorders: Secondary | ICD-10-CM | POA: Insufficient documentation

## 2014-03-08 DIAGNOSIS — M25662 Stiffness of left knee, not elsewhere classified: Secondary | ICD-10-CM | POA: Insufficient documentation

## 2014-03-08 DIAGNOSIS — Z96652 Presence of left artificial knee joint: Secondary | ICD-10-CM | POA: Insufficient documentation

## 2014-03-08 DIAGNOSIS — Z7689 Persons encountering health services in other specified circumstances: Secondary | ICD-10-CM | POA: Insufficient documentation

## 2014-03-08 DIAGNOSIS — M25562 Pain in left knee: Secondary | ICD-10-CM | POA: Diagnosis not present

## 2014-03-08 DIAGNOSIS — R5381 Other malaise: Secondary | ICD-10-CM | POA: Insufficient documentation

## 2014-03-12 ENCOUNTER — Ambulatory Visit: Payer: 59 | Attending: Orthopedic Surgery | Admitting: Physical Therapy

## 2014-03-12 DIAGNOSIS — Z7689 Persons encountering health services in other specified circumstances: Secondary | ICD-10-CM | POA: Diagnosis not present

## 2014-03-15 ENCOUNTER — Ambulatory Visit: Payer: 59 | Admitting: Physical Therapy

## 2014-03-15 DIAGNOSIS — Z7689 Persons encountering health services in other specified circumstances: Secondary | ICD-10-CM | POA: Diagnosis not present

## 2014-03-19 ENCOUNTER — Ambulatory Visit: Payer: 59 | Admitting: Physical Therapy

## 2014-03-19 DIAGNOSIS — Z7689 Persons encountering health services in other specified circumstances: Secondary | ICD-10-CM | POA: Diagnosis not present

## 2014-03-22 ENCOUNTER — Ambulatory Visit: Payer: 59 | Admitting: Physical Therapy

## 2014-03-22 DIAGNOSIS — Z7689 Persons encountering health services in other specified circumstances: Secondary | ICD-10-CM | POA: Diagnosis not present

## 2014-03-26 ENCOUNTER — Ambulatory Visit: Payer: 59 | Admitting: Physical Therapy

## 2014-03-26 DIAGNOSIS — Z7689 Persons encountering health services in other specified circumstances: Secondary | ICD-10-CM | POA: Diagnosis not present

## 2014-03-29 ENCOUNTER — Ambulatory Visit: Payer: 59 | Admitting: Physical Therapy

## 2014-03-29 DIAGNOSIS — Z7689 Persons encountering health services in other specified circumstances: Secondary | ICD-10-CM | POA: Diagnosis not present

## 2014-04-02 ENCOUNTER — Ambulatory Visit: Payer: 59 | Admitting: Physical Therapy

## 2014-04-02 DIAGNOSIS — Z7689 Persons encountering health services in other specified circumstances: Secondary | ICD-10-CM | POA: Diagnosis not present

## 2014-04-05 ENCOUNTER — Ambulatory Visit: Payer: 59 | Admitting: Physical Therapy

## 2014-04-05 DIAGNOSIS — Z7689 Persons encountering health services in other specified circumstances: Secondary | ICD-10-CM | POA: Diagnosis not present

## 2014-04-09 ENCOUNTER — Ambulatory Visit: Payer: 59 | Attending: Orthopedic Surgery | Admitting: Physical Therapy

## 2014-04-09 DIAGNOSIS — Z7689 Persons encountering health services in other specified circumstances: Secondary | ICD-10-CM | POA: Diagnosis present

## 2014-04-09 DIAGNOSIS — M7989 Other specified soft tissue disorders: Secondary | ICD-10-CM | POA: Insufficient documentation

## 2014-04-09 DIAGNOSIS — Z96652 Presence of left artificial knee joint: Secondary | ICD-10-CM | POA: Diagnosis not present

## 2014-04-09 DIAGNOSIS — M25562 Pain in left knee: Secondary | ICD-10-CM | POA: Diagnosis not present

## 2014-04-09 DIAGNOSIS — M25662 Stiffness of left knee, not elsewhere classified: Secondary | ICD-10-CM | POA: Diagnosis not present

## 2014-04-09 DIAGNOSIS — R5381 Other malaise: Secondary | ICD-10-CM | POA: Diagnosis not present

## 2014-04-12 ENCOUNTER — Ambulatory Visit: Payer: 59 | Admitting: Physical Therapy

## 2014-04-12 DIAGNOSIS — Z7689 Persons encountering health services in other specified circumstances: Secondary | ICD-10-CM | POA: Diagnosis not present

## 2014-04-16 ENCOUNTER — Ambulatory Visit: Payer: 59 | Admitting: Physical Therapy

## 2014-04-16 DIAGNOSIS — Z7689 Persons encountering health services in other specified circumstances: Secondary | ICD-10-CM | POA: Diagnosis not present

## 2014-04-19 ENCOUNTER — Ambulatory Visit: Payer: 59 | Admitting: Physical Therapy

## 2014-04-19 DIAGNOSIS — Z7689 Persons encountering health services in other specified circumstances: Secondary | ICD-10-CM | POA: Diagnosis not present

## 2014-04-23 ENCOUNTER — Ambulatory Visit: Payer: 59

## 2014-04-23 DIAGNOSIS — Z7689 Persons encountering health services in other specified circumstances: Secondary | ICD-10-CM | POA: Diagnosis not present

## 2014-05-07 ENCOUNTER — Other Ambulatory Visit: Payer: 59

## 2014-05-07 ENCOUNTER — Ambulatory Visit (INDEPENDENT_AMBULATORY_CARE_PROVIDER_SITE_OTHER): Payer: 59 | Admitting: Internal Medicine

## 2014-05-07 ENCOUNTER — Encounter: Payer: Self-pay | Admitting: Internal Medicine

## 2014-05-07 VITALS — BP 152/82 | HR 112 | Ht 63.0 in | Wt 196.0 lb

## 2014-05-07 DIAGNOSIS — I2699 Other pulmonary embolism without acute cor pulmonale: Secondary | ICD-10-CM

## 2014-05-07 DIAGNOSIS — H3570 Unspecified separation of retinal layers: Secondary | ICD-10-CM

## 2014-05-07 DIAGNOSIS — H3320 Serous retinal detachment, unspecified eye: Secondary | ICD-10-CM | POA: Insufficient documentation

## 2014-05-07 DIAGNOSIS — H3322 Serous retinal detachment, left eye: Secondary | ICD-10-CM

## 2014-05-07 NOTE — Progress Notes (Signed)
Subjective:    Patient ID: Brittany Mason, female    DOB: 1950-11-16, 63 y.o.   MRN: [id number]  HPI    OV 05/07/2014  Chief Complaint  Patient presents with  . Follow-up    Pt denies SOB, cough and CP/tightness. Pt states overall she is feeling well. Pt is questioning how long to be on the Xarelto.    Follow-up sub-massive pulmonary embolism sustained in November 2014 in the setting of Premarin  She has now completed one year of therapy with Xarelto. She has been asymptomatic from a respiratory standpoint. In April 2015 she went scuba diving in [place] and did not have problems. In August 2015 underwent left total knee replacement and has completed physical therapy. Postoperative course was fine and she did not have any problems. She is now questioning whether she should still continue anticoagulation or stop it. No DVT or PE or dyspnea  Or chest pain.s HE is neutral about continuing v dc NOAC  Past, Family, Social reviewed: Sustained left retinal detachment and diagnosed with that today by tried retinal clinic. She is due for surgery tomorrow. She reports that there is some bleeding in her left eye. She is still continuing her anticoagulation. Last dose was yesterday.  Review of Systems  Constitutional: Negative for fever and unexpected weight change.  HENT: Negative for congestion, dental problem, ear pain, nosebleeds, postnasal drip, rhinorrhea, sinus pressure, sneezing, sore throat and trouble swallowing.   Eyes: Negative for redness and itching.  Respiratory: Negative for cough, chest tightness, shortness of breath and wheezing.   Cardiovascular: Negative for palpitations and leg swelling.  Gastrointestinal: Negative for nausea and vomiting.  Genitourinary: Negative for dysuria.  Musculoskeletal: Negative for joint swelling.  Skin: Negative for rash.  Neurological: Negative for headaches.  Hematological: Does not bruise/bleed easily.  Psychiatric/Behavioral: Negative for  dysphoric mood. The patient is not nervous/anxious.        Objective:   Physical Exam  Constitutional: She is oriented to person, place, and time. She appears well-developed and well-nourished. No distress.  HENT:  Head: Normocephalic and atraumatic.  Right Ear: External ear normal.  Left Ear: External ear normal.  Mouth/Throat: Oropharynx is clear and moist. No oropharyngeal exudate.  Eyes: Conjunctivae and EOM are normal. Pupils are equal, round, and reactive to light. Right eye exhibits no discharge. Left eye exhibits no discharge. No scleral icterus.  Neck: Normal range of motion. Neck supple. No JVD present. No tracheal deviation present. No thyromegaly present.  Cardiovascular: Normal rate, regular rhythm, normal heart sounds and intact distal pulses.  Exam reveals no gallop and no friction rub.   No murmur heard. Pulmonary/Chest: Effort normal and breath sounds normal. No respiratory distress. She has no wheezes. She has no rales. She exhibits no tenderness.  Abdominal: Soft. Bowel sounds are normal. She exhibits no distension and no mass. There is no tenderness. There is no rebound and no guarding.  Musculoskeletal: Normal range of motion. She exhibits no edema or tenderness.  Lymphadenopathy:    She has no cervical adenopathy.  Neurological: She is alert and oriented to person, place, and time. She has normal reflexes. No cranial nerve deficit. She exhibits normal muscle tone. Coordination normal.  Skin: Skin is warm and dry. No rash noted. She is not diaphoretic. No erythema. No pallor.  Psychiatric: She has a normal mood and affect. Her behavior is normal. Judgment and thought content normal.  Vitals reviewed.   Filed Vitals:   05/07/14 1513  BP: 152/82  Pulse: 112  Height: 5\' 3"  (1.6 m)  Weight: 196 lb (88.905 kg)  SpO2: 96%         Assessment & Plan:     ICD-9-CM ICD-10-CM   1. Pulmonary embolism 415.19 I26.99 D-Dimer, Quantitative  2. Retinal detachment, left  361.9 H35.70    Discussed risk of recurrence with premature discontinuation of anticoagulation in the setting of pulmonary embolism. Overall this is between 10 and 25% in the next few years. Her main risk factor for pulmonary embolism was Premarin and obesity. Now she is not on Premarin and she is completed one year of anticoagulation therefore would suspect that she is at low risk for recurrence. Her risk for recurrence would be even lower if we checked a d-dimer and this is normal.  She is agreeable to stopping anticoagulation if her d-dimer is normal and just switching to baby aspirin but she understands that the risks for a second pulmonary embolism is not 0 and if this happens she'll undergo an antibiotic regulation for life  In any event due to retinal detachment and left eye bleeding she needs to stop Xarelto today\\   PLAN   Stop Xarelto 05/07/2014 in view of acute left retinal detachment Check D-dimer:  - if abnormal, then we will resume xarelto for another 6 months once cleared by eye doctor that is safe for you to restart  - if normal: then no more xarelto but instead take baby aspirin 81mg  but again once cleared by eye doctor that is safe to start  Followup  - will call with results of d-dimer and decide followup   (> 50% of this 15 min visit spent in face to face counseling)  Dr. Brand Males, M.D., [facility].C.P Pulmonary and Critical Care Medicine Staff Physician [place] [facility] Pager: [phone], If no answer or between  15:00h - 7:00h: call [phone]  05/07/2014 3:38 PM

## 2014-05-07 NOTE — Patient Instructions (Addendum)
ICD-9-CM ICD-10-CM   1. Pulmonary embolism 415.19 I26.99 D-Dimer, Quantitative  2. Retinal detachment, left 361.9 H35.70     Stop Xarelto 05/07/2014 in view of acute left retinal detachment Check D-dimer:  - if abnormal, then we will resume xarelto for another 6 months once cleared by eye doctor that is safe for you to restart  - if normal: then no more xarelto but instead take baby aspirin 81mg  but again once cleared by eye doctor that is safe to start  Followup  - will call with results of d-dimer and decide followup

## 2014-05-08 ENCOUNTER — Encounter (INDEPENDENT_AMBULATORY_CARE_PROVIDER_SITE_OTHER): Payer: 59 | Admitting: Ophthalmology

## 2014-05-08 DIAGNOSIS — H43813 Vitreous degeneration, bilateral: Secondary | ICD-10-CM

## 2014-05-08 DIAGNOSIS — H33302 Unspecified retinal break, left eye: Secondary | ICD-10-CM

## 2014-05-08 DIAGNOSIS — H4312 Vitreous hemorrhage, left eye: Secondary | ICD-10-CM

## 2014-05-08 LAB — D-DIMER, QUANTITATIVE: D-Dimer, Quant: 1.66 ug/mL-FEU — ABNORMAL HIGH (ref 0.00–0.48)

## 2014-05-09 ENCOUNTER — Telehealth: Payer: Self-pay | Admitting: Internal Medicine

## 2014-05-09 ENCOUNTER — Encounter (INDEPENDENT_AMBULATORY_CARE_PROVIDER_SITE_OTHER): Payer: 59 | Admitting: Ophthalmology

## 2014-05-09 NOTE — Telephone Encounter (Signed)
Please let Geraldyn Shain know tht D-dimer is 1.66 and elevated. So continue xarelto at full dose for another 38months. ROV in 6 months with d-dimer done at time of visit  Thanks  Dr. Brand Males, M.D., [facility].C.P Pulmonary and Critical Care Medicine Staff Physician [place] [facility] Pager: [phone], If no answer or between  15:00h - 7:00h: call [phone]  05/09/2014 10:44 AM

## 2014-05-09 NOTE — Telephone Encounter (Signed)
Called and spoke to pt. Informed pt of the results and recs per MR. 6 month recall placed. Pt verbalized understanding and denied any further questions or concerns at this time.

## 2014-05-16 ENCOUNTER — Ambulatory Visit (INDEPENDENT_AMBULATORY_CARE_PROVIDER_SITE_OTHER): Payer: 59 | Admitting: Ophthalmology

## 2014-05-16 DIAGNOSIS — H33302 Unspecified retinal break, left eye: Secondary | ICD-10-CM

## 2014-09-17 ENCOUNTER — Ambulatory Visit (INDEPENDENT_AMBULATORY_CARE_PROVIDER_SITE_OTHER): Payer: 59 | Admitting: Ophthalmology

## 2014-09-17 DIAGNOSIS — H33302 Unspecified retinal break, left eye: Secondary | ICD-10-CM | POA: Diagnosis not present

## 2014-09-17 DIAGNOSIS — H43813 Vitreous degeneration, bilateral: Secondary | ICD-10-CM

## 2014-09-17 DIAGNOSIS — H2513 Age-related nuclear cataract, bilateral: Secondary | ICD-10-CM | POA: Diagnosis not present

## 2014-11-02 ENCOUNTER — Encounter: Payer: Self-pay | Admitting: Internal Medicine

## 2014-11-02 NOTE — Telephone Encounter (Signed)
Called and spoke to pt due to complexity of email. Informed her to continue taking the Xarelto until OV where a d dimer will be done. Appt made with MR on 12/12/14. Pt verbalized understanding and denied any further questions or concerns at this time.

## 2014-12-12 ENCOUNTER — Other Ambulatory Visit: Payer: 59

## 2014-12-12 ENCOUNTER — Ambulatory Visit (INDEPENDENT_AMBULATORY_CARE_PROVIDER_SITE_OTHER): Payer: 59 | Admitting: Internal Medicine

## 2014-12-12 ENCOUNTER — Encounter: Payer: Self-pay | Admitting: Internal Medicine

## 2014-12-12 VITALS — BP 130/78 | HR 64 | Ht 63.0 in | Wt 189.0 lb

## 2014-12-12 DIAGNOSIS — I2699 Other pulmonary embolism without acute cor pulmonale: Secondary | ICD-10-CM

## 2014-12-12 NOTE — Progress Notes (Signed)
Subjective:    Patient ID: Brittany Mason, female    DOB: 13-Dec-1950, 64 y.o.   MRN: [id number]  HPI    OV 12/12/2014  Chief Complaint  Patient presents with  . Follow-up    Pt states her breathing is doing well. Pt denies SOB, cough and CP/tightness.     Follow-up sub-massive pulmonary embolism sustained in November 2014 in the setting of Premarin  She has now completed 19 months of therapy with Xarelto. She has been asymptomatic from a respiratory standpoint. In April 2015 she went scuba diving in [place] and did not have problems. In August 2015 underwent left total knee replacement and has completed physical therapy. Postoperative course was fine and she did not have any problems. Then in November 2015/December 2015 she had retinal detachment and has been successfully operated. Not much of bleeding. Her ophthalmologists are aware that she is on full anticoagulation therapy with Xarelto . She is still interested in stopping Xarelto completely but in November 2015 her d-dimer was high and we opted to continue the Xarelto and reassess at this visit .   Past medical history, social history, family history reviewed: No new changes    Current outpatient prescriptions:  .  eletriptan (RELPAX) 40 MG tablet, Take 40 mg by mouth as needed for migraine. One tablet by mouth at onset of headache. May repeat in 2 hours if headache persists or recurs., Disp: , Rfl:  .  escitalopram (LEXAPRO) 20 MG tablet, Take 20 mg by mouth every morning. , Disp: , Rfl:  .  fenofibrate 160 MG tablet, Take 160 mg by mouth daily. Takes at bedtime, Disp: , Rfl:  .  levothyroxine (SYNTHROID, LEVOTHROID) 150 MCG tablet, Take 150 mcg by mouth daily before breakfast. , Disp: , Rfl:  .  morphine (MSIR) 30 MG tablet, Take 30 mg by mouth every 12 (twelve) hours., Disp: , Rfl:  .  omega-3 acid ethyl esters (LOVAZA) 1 G capsule, Take 2 g by mouth 2 (two) times daily., Disp: , Rfl:  .  ondansetron (ZOFRAN-ODT) 8 MG  disintegrating tablet, Take 8 mg by mouth every 8 (eight) hours as needed for nausea., Disp: , Rfl:  .  rivaroxaban (XARELTO) 20 MG TABS tablet, Take 20 mg by mouth daily with supper., Disp: , Rfl:  .  rOPINIRole (REQUIP) 0.5 MG tablet, Take 0.5 mg by mouth every evening. , Disp: , Rfl:  .  simvastatin (ZOCOR) 40 MG tablet, Take 40 mg by mouth every evening., Disp: , Rfl:       Review of Systems  Constitutional: Negative for fever and unexpected weight change.  HENT: Positive for nosebleeds. Negative for congestion, dental problem, ear pain, postnasal drip, rhinorrhea, sinus pressure, sneezing, sore throat and trouble swallowing.   Eyes: Negative for redness and itching.  Respiratory: Negative for cough, chest tightness, shortness of breath and wheezing.   Cardiovascular: Negative for palpitations and leg swelling.  Gastrointestinal: Negative for nausea and vomiting.  Genitourinary: Negative for dysuria.  Musculoskeletal: Negative for joint swelling.  Skin: Negative for rash.  Neurological: Negative for headaches.  Hematological: Does not bruise/bleed easily.  Psychiatric/Behavioral: Negative for dysphoric mood. The patient is not nervous/anxious.        Objective:   Physical Exam  Constitutional: She is oriented to person, place, and time. She appears well-developed and well-nourished. No distress.  Body mass index is 33.49 kg/(m^2).   HENT:  Head: Normocephalic and atraumatic.  Right Ear: External ear normal.  Left Ear:  External ear normal.  Mouth/Throat: Oropharynx is clear and moist. No oropharyngeal exudate.  Eyes: Conjunctivae and EOM are normal. Pupils are equal, round, and reactive to light. Right eye exhibits no discharge. Left eye exhibits no discharge. No scleral icterus.  Neck: Normal range of motion. Neck supple. No JVD present. No tracheal deviation present. No thyromegaly present.  Cardiovascular: Normal rate, regular rhythm, normal heart sounds and intact distal  pulses.  Exam reveals no gallop and no friction rub.   No murmur heard. Pulmonary/Chest: Effort normal and breath sounds normal. No respiratory distress. She has no wheezes. She has no rales. She exhibits no tenderness.  Abdominal: Soft. Bowel sounds are normal. She exhibits no distension and no mass. There is no tenderness. There is no rebound and no guarding.  Musculoskeletal: Normal range of motion. She exhibits no edema or tenderness.  Scar on knee from surgery Mild varicose veins in the calves Homans sign negative  Lymphadenopathy:    She has no cervical adenopathy.  Neurological: She is alert and oriented to person, place, and time. She has normal reflexes. No cranial nerve deficit. She exhibits normal muscle tone. Coordination normal.  Skin: Skin is warm and dry. No rash noted. She is not diaphoretic. No erythema. No pallor.  Psychiatric: She has a normal mood and affect. Her behavior is normal. Judgment and thought content normal.  Vitals reviewed.   Filed Vitals:   12/12/14 1348  BP: 130/78  Pulse: 64  Height: 5\' 3"  (1.6 m)  Weight: 189 lb (85.73 kg)  SpO2: 96%         Assessment & Plan:     ICD-9-CM ICD-10-CM   1. Pulmonary embolism 415.19 I26.99 D-Dimer, Quantitative  CLinically stable without evidence of CTEPH  clnically  Check D-dimer:  - if abnormal, then we will discuss low dose xarelto v full dose xarelto v low dose coumadin  - if normal: then no more xarelto but instead take baby aspirin 81mg  but again once cleared by eye doctor that is safe to start  Followup  - will call with results of d-dimer and decide followup

## 2014-12-12 NOTE — Patient Instructions (Addendum)
ICD-9-CM ICD-10-CM   1. Pulmonary embolism 415.19 I26.99     Check D-dimer:  - if abnormal, then we will discuss low dose xarelto v full dose xarelto v low dose coumadin  - if normal: then no more xarelto but instead take baby aspirin 81mg  but again once cleared by eye doctor that is safe to start  Followup  - will call with results of d-dimer and decide followup

## 2014-12-13 ENCOUNTER — Telehealth: Payer: Self-pay | Admitting: Internal Medicine

## 2014-12-13 LAB — D-DIMER, QUANTITATIVE: D-Dimer, Quant: 1 ug/mL-FEU — ABNORMAL HIGH (ref 0.00–0.48)

## 2014-12-13 NOTE — Telephone Encounter (Signed)
Let her kow that d-didmer still elevateed at 1.0 but less than last time (1.4 - 7 months ago). ADvise to stay on xarelto till nov 2016 when she woulde have completed 2 years Rx. Give fu dec 2016 (early dec 2016) with d-dimar repeat at fu. Alternate, she can do a consult with Dr Beryle Beams of heme/int med about continued Rx v low dose Rx v changing to aspirin now

## 2014-12-14 NOTE — Telephone Encounter (Signed)
Called and spoke to pt. Informed her of the results and recs per MR. Recall placed for OV in December. Pt verbalized understanding and denied any further questions or concerns at this time.

## 2015-06-13 MED FILL — XARELTO 20 MG TABLET: 20 | 30 days supply | Qty: 30 | Fill #0

## 2015-06-20 ENCOUNTER — Ambulatory Visit (INDEPENDENT_AMBULATORY_CARE_PROVIDER_SITE_OTHER): Payer: 59 | Admitting: Internal Medicine

## 2015-06-20 ENCOUNTER — Encounter: Payer: Self-pay | Admitting: Internal Medicine

## 2015-06-20 ENCOUNTER — Other Ambulatory Visit: Payer: 59

## 2015-06-20 VITALS — BP 124/80 | HR 91 | Ht 63.0 in | Wt 199.0 lb

## 2015-06-20 DIAGNOSIS — I2699 Other pulmonary embolism without acute cor pulmonale: Secondary | ICD-10-CM

## 2015-06-20 HISTORY — DX: Other pulmonary embolism without acute cor pulmonale: I26.99

## 2015-06-20 NOTE — Progress Notes (Signed)
Subjective:     Patient ID: Brittany Mason, female   DOB: 1951/03/07, 65 y.o.   MRN: [id number]  HPI  OV 06/20/2015  Chief Complaint  Patient presents with  . Follow-up    Pt states her breathing is unchanged since last OV. Pt denies SOB, hemoptysis, CP/tightness.    65 year old female follow-up pulmonary embolism in the setting of obesity and Premarin in November 2014.  Last seen July 2016. At that time d-dimer was high [although it did improve from 1.66 in November 2015 21.0 in July 2016] so we opted to continue Xarelto. She presents for follow-up with the specific question of if she can come off Xarelto now it has been over 2 years since diagnosis of acute pulmonary embolism. She has no symptoms. She is active. Her obesity continues. No bleeding. No side effects with Xarelto. Feels well. No dyspnea. No chest pain. No hemoptysis.  Current outpatient prescriptions:  .  eletriptan (RELPAX) 40 MG tablet, Take 40 mg by mouth as needed for migraine. One tablet by mouth at onset of headache. May repeat in 2 hours if headache persists or recurs., Disp: , Rfl:  .  escitalopram (LEXAPRO) 20 MG tablet, Take 20 mg by mouth every morning. , Disp: , Rfl:  .  fenofibrate 160 MG tablet, Take 160 mg by mouth daily. Takes at bedtime, Disp: , Rfl:  .  levothyroxine (SYNTHROID, LEVOTHROID) 150 MCG tablet, Take 150 mcg by mouth daily before breakfast. , Disp: , Rfl:  .  morphine (MSIR) 30 MG tablet, Take 30 mg by mouth every 12 (twelve) hours., Disp: , Rfl:  .  omega-3 acid ethyl esters (LOVAZA) 1 G capsule, Take 2 g by mouth 2 (two) times daily., Disp: , Rfl:  .  ondansetron (ZOFRAN-ODT) 8 MG disintegrating tablet, Take 8 mg by mouth every 8 (eight) hours as needed for nausea., Disp: , Rfl:  .  rivaroxaban (XARELTO) 20 MG TABS tablet, Take 20 mg by mouth daily with supper., Disp: , Rfl:  .  rOPINIRole (REQUIP) 0.5 MG tablet, Take 0.5 mg by mouth every evening. , Disp: , Rfl:  .  simvastatin (ZOCOR) 40 MG  tablet, Take 40 mg by mouth every evening., Disp: , Rfl:   Allergies  Allergen Reactions  . Codeine Nausea And Vomiting    Takes hydrocodone; if takes doses too close together, states "violently throws up"  . Erythromycin     unknown    Immunization History  Administered Date(s) Administered  . Influenza Split 03/13/2013, 02/06/2014  . Influenza,inj,Quad PF,36+ Mos 03/09/2015  . Tdap 11/16/2013  . Zoster 08/06/2013      Review of Systems  Per hpi    Objective:   Physical Exam  Constitutional: She is oriented to person, place, and time. She appears well-developed and well-nourished. No distress.  Body mass index is 35.26 kg/(m^2).   HENT:  Head: Normocephalic and atraumatic.  Right Ear: External ear normal.  Left Ear: External ear normal.  Mouth/Throat: Oropharynx is clear and moist. No oropharyngeal exudate.  Eyes: Conjunctivae and EOM are normal. Pupils are equal, round, and reactive to light. Right eye exhibits no discharge. Left eye exhibits no discharge. No scleral icterus.  Neck: Normal range of motion. Neck supple. No JVD present. No tracheal deviation present. No thyromegaly present.  Cardiovascular: Normal rate, regular rhythm, normal heart sounds and intact distal pulses.  Exam reveals no gallop and no friction rub.   No murmur heard. Pulmonary/Chest: Effort normal and breath sounds normal. No  respiratory distress. She has no wheezes. She has no rales. She exhibits no tenderness.  Abdominal: Soft. Bowel sounds are normal. She exhibits no distension and no mass. There is no tenderness. There is no rebound and no guarding.  Musculoskeletal: Normal range of motion. She exhibits no edema or tenderness.  Lymphadenopathy:    She has no cervical adenopathy.  Neurological: She is alert and oriented to person, place, and time. She has normal reflexes. No cranial nerve deficit. She exhibits normal muscle tone. Coordination normal.  Skin: Skin is warm and dry. No rash noted.  She is not diaphoretic. No erythema. No pallor.  Psychiatric: She has a normal mood and affect. Her behavior is normal. Judgment and thought content normal.  Vitals reviewed.   Filed Vitals:   06/20/15 1501  BP: 124/80  Pulse: 91  Height: 5\' 3"  (1.6 m)  Weight: 199 lb (90.266 kg)  SpO2: 96%        Assessment:       ICD-9-CM ICD-10-CM   1. Other acute pulmonary embolism (HCC)  I26.99 D-Dimer, Quantitative       Plan:       Check D-dimer:  - if abnormal, then we will discuss low dose xarelto v full dose xarelto v low dose coumadin v no xarelto  - if normal: then no more xarelto but instead take baby aspirin 81mg  but again once cleared by eye doctor that is safe to start  Followup  - will call with results of d-dimer and decide followup   Dr. Brand Males, M.D., [facility]C.P Pulmonary and Critical Care Medicine Staff Physician [facility] Pager: [phone], If no answer or between  15:00h - 7:00h: call [phone]  06/20/2015 3:18 PM

## 2015-06-20 NOTE — Patient Instructions (Signed)
ICD-9-CM ICD-10-CM   1. Other acute pulmonary embolism (HCC)  I26.99     Check D-dimer:  - if abnormal, then we will discuss low dose xarelto v full dose xarelto v low dose coumadin v no xarelto  - if normal: then no more xarelto but instead take baby aspirin 81mg  but again once cleared by eye doctor that is safe to start  Followup  - will call with results of d-dimer and decide followup

## 2015-06-21 ENCOUNTER — Telehealth: Payer: Self-pay | Admitting: Internal Medicine

## 2015-06-21 LAB — D-DIMER, QUANTITATIVE: D-Dimer, Quant: 0.5 ug/mL-FEU — ABNORMAL HIGH (ref 0.00–0.48)

## 2015-06-21 NOTE — Telephone Encounter (Signed)
Let Ms  Brittany Mason know that d-dimer is 0.5 - improved from 1.0 x 6 mo ago and 1.66 x 1 yea ago. Technically is still high because normal is < 0.48 but is really near normal. She has completed over 2 years of xarelto. Ok to stop xarelto and start aspirin 81mg  per day. Risk for recurrence is lower thn had we stopped it earlier but never zero; she knows this  Give fu in 1 year

## 2015-06-24 NOTE — Telephone Encounter (Signed)
I spoke with patient about results and she verbalized understanding and had no questions 

## 2015-06-28 MED FILL — FENOFIBRATE 160 MG TABLET: 160 | 90 days supply | Qty: 90 | Fill #0

## 2015-06-28 MED FILL — rOPINIRole HCL 0.5 MG TABS: 0.5 | 90 days supply | Qty: 90 | Fill #0

## 2015-07-02 MED FILL — SIMVASTATIN 40 MG TABLET: 40 | 90 days supply | Qty: 90 | Fill #0

## 2015-07-02 MED FILL — SYNTHROID 150 MCG TABLET: 150 | 90 days supply | Qty: 90 | Fill #0

## 2015-07-16 MED FILL — MORPHINE SULF ER 30 MG TAB: 30 | 30 days supply | Qty: 60 | Fill #0

## 2015-07-24 DIAGNOSIS — G2581 Restless legs syndrome: Secondary | ICD-10-CM | POA: Diagnosis not present

## 2015-07-24 DIAGNOSIS — G8929 Other chronic pain: Secondary | ICD-10-CM | POA: Diagnosis not present

## 2015-07-24 DIAGNOSIS — I2699 Other pulmonary embolism without acute cor pulmonale: Secondary | ICD-10-CM | POA: Diagnosis not present

## 2015-07-24 DIAGNOSIS — E782 Mixed hyperlipidemia: Secondary | ICD-10-CM | POA: Diagnosis not present

## 2015-07-24 DIAGNOSIS — E039 Hypothyroidism, unspecified: Secondary | ICD-10-CM | POA: Diagnosis not present

## 2015-07-24 DIAGNOSIS — Z79899 Other long term (current) drug therapy: Secondary | ICD-10-CM | POA: Diagnosis not present

## 2015-07-29 MED FILL — ESCITALOPRAM 20 MG TABLET: 20 | 90 days supply | Qty: 90 | Fill #0

## 2015-08-21 DIAGNOSIS — R079 Chest pain, unspecified: Secondary | ICD-10-CM | POA: Diagnosis not present

## 2015-08-22 MED FILL — OMEGA-3 ETHYL ESTERS 1 GM C: 1 | 90 days supply | Qty: 360 | Fill #0

## 2015-08-22 MED FILL — DICLOFENAC SOD EC 75 MG TAB: 75 | 90 days supply | Qty: 180 | Fill #0

## 2015-08-22 MED FILL — CYCLOBENZAPRINE 5 MG TABLET: 5 | 90 days supply | Qty: 270 | Fill #0

## 2015-08-27 ENCOUNTER — Telehealth: Payer: Self-pay | Admitting: Physician Assistant

## 2015-08-27 NOTE — Telephone Encounter (Signed)
Received records from [place] for appointment on 08/28/15 with [facility].  Records given to [employer] (medical records) for Brittany Mason's schedule on 08/28/15. lp

## 2015-08-28 ENCOUNTER — Ambulatory Visit (INDEPENDENT_AMBULATORY_CARE_PROVIDER_SITE_OTHER): Payer: 59 | Admitting: Physician Assistant

## 2015-08-28 ENCOUNTER — Encounter: Payer: Self-pay | Admitting: Cardiovascular Disease

## 2015-08-28 ENCOUNTER — Encounter: Payer: Self-pay | Admitting: Physician Assistant

## 2015-08-28 VITALS — BP 150/78 | HR 74 | Ht 63.0 in | Wt 189.2 lb

## 2015-08-28 DIAGNOSIS — E7800 Pure hypercholesterolemia, unspecified: Secondary | ICD-10-CM

## 2015-08-28 DIAGNOSIS — R079 Chest pain, unspecified: Secondary | ICD-10-CM | POA: Diagnosis not present

## 2015-08-28 DIAGNOSIS — I209 Angina pectoris, unspecified: Secondary | ICD-10-CM | POA: Insufficient documentation

## 2015-08-28 NOTE — Progress Notes (Signed)
Patient ID: Brittany Mason, female   DOB: 07/24/1950, 65 y.o.   MRN: [id number]    Date:  08/28/2015   ID:  Brittany Mason, DOB December 09, 1950, MRN [id number]  PCP:  Gennette Pac, MD  Primary Cardiologist:  Gwenlyn Found    chief complaint : Chest pain   History of Present Illness: Brittany Mason is a 65 y.o. obese female who is single with no children. 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   Her history includes hyperlipidemia , mitral valve prolapse hypothyroidism , pulmonary embolism 2014.  She does have a strong family history with a brother who died of myocardial infarction at age 50 and a father who had bypass grafting in his 46s. She has never had a heart stroke.   She had a low risk nuclear stress test November 2014.   Her most recent echocardiogram was February 2015 ejection fraction was 65-70% normal wall motion grade 1 diastolic dysfunction trivial pericardial effusion   she presents for evaluation of chest pain she had back in February. The episode was extremely intense  Lasted approximately 10-15 minutes. It radiated up to the right side of her neck and across her chest. For about 2 weeks after the episode  Her chest was very sore to the touch on the left side.   She also felt as though she's had some indigestion type issues in her abdomen after the episode. She had no nausea, vomiting diaphoresis, shortness of breath.  About a week ago she wrote her exercise bike for approximately 20 minutes to a point when she got sweaty but did not have any chest pain.   She was recently taken off of Xarelto for the pulmonary embolism she had 2014.  The patient currently denies, fever, orthopnea, dizziness, PND, cough, congestion, abdominal pain, hematochezia, melena, lower extremity edema, claudication.  Wt Readings from Last 3 Encounters:  08/28/15 189 lb 3.2 oz (85.821 kg)  06/20/15 199 lb (90.266 kg)  12/12/14 189 lb (85.73 kg)     Past Medical History  Diagnosis Date    . Hypothyroidism   . Hypercholesterolemia   . RLS (restless legs syndrome)   . PONV (postoperative nausea and vomiting)   . Chronic back pain   . MVP (mitral valve prolapse)     asymptomatic  . Dyspnea on exertion   . PE (pulmonary embolism) Apr 18, 2013    tx. -using Xarelto now, no further lung problems, denies SOB on 63-28-15  . Headache(784.0)     migraines, 1-2 every 2 months  . Arthritis     osteoarthritis-knees. Chronic back pain  . DVT (deep venous thrombosis) ([place])     01-02-14 hx. left lower leg, resulted in Pulmonary emboli    Current Outpatient Prescriptions  Medication Sig Dispense Refill  . aspirin 81 MG tablet Take 81 mg by mouth daily.    Marland Kitchen eletriptan (RELPAX) 40 MG tablet Take 40 mg by mouth as needed for migraine. One tablet by mouth at onset of headache. May repeat in 2 hours if headache persists or recurs.    Marland Kitchen escitalopram (LEXAPRO) 20 MG tablet Take 20 mg by mouth every morning.     . fenofibrate 160 MG tablet Take 160 mg by mouth daily. Takes at bedtime    . levothyroxine (SYNTHROID, LEVOTHROID) 150 MCG tablet Take 150 mcg by mouth daily before breakfast.     . morphine (MSIR) 30 MG tablet Take 30 mg by mouth every 12 (twelve)  hours.    . omega-3 acid ethyl esters (LOVAZA) 1 G capsule Take 2 g by mouth 2 (two) times daily.    . ondansetron (ZOFRAN-ODT) 8 MG disintegrating tablet Take 8 mg by mouth every 8 (eight) hours as needed for nausea.    Marland Kitchen rOPINIRole (REQUIP) 0.5 MG tablet Take 0.5 mg by mouth every evening.     . simvastatin (ZOCOR) 40 MG tablet Take 40 mg by mouth every evening.    . cyclobenzaprine (FLEXERIL) 5 MG tablet Take 5 mg by mouth daily as needed for muscle spasms.   1  . diclofenac (VOLTAREN) 75 MG EC tablet Take 75 mg by mouth daily.  3   No current facility-administered medications for this visit.    Allergies:    Allergies  Allergen Reactions  . Codeine Nausea And Vomiting    Takes hydrocodone; if takes doses too close together,  states "violently throws up"  . Erythromycin     unknown    Social History:  The patient  reports that she has never smoked. She has never used smokeless tobacco. She reports that she drinks alcohol. She reports that she does not use illicit drugs.   Family history:   Family History  Problem Relation Age of Onset  . Hypertension Mother   . Heart disease Mother   . Heart disease Father   . Hypertension Father   . Stroke Brother     DIED AT 8  . Cancer Maternal Aunt     lung, colon  . Breast cancer Maternal Aunt   . Breast cancer Paternal Aunt     ROS:  Please see the history of present illness.  All other systems reviewed and negative.   PHYSICAL EXAM: VS:  BP 150/78 mmHg  Pulse 74  Ht 5\' 3"  (1.6 m)  Wt 189 lb 3.2 oz (85.821 kg)  BMI 33.52 kg/m2 Obese, well developed, in no acute distress HEENT: Pupils are equal round react to light accommodation extraocular movements are intact.  Neck: no JVDNo cervical lymphadenopathy. Cardiac: Regular rate and rhythm without murmurs rubs or gallops. Lungs:  clear to auscultation bilaterally, no wheezing, rhonchi or rales Abd: soft, nontender, positive bowel sounds all quadrants, no hepatosplenomegaly Ext: no lower extremity edema.  2+ radial and dorsalis pedis pulses. Skin: warm and dry Neuro:  Grossly normal  EKG:   Normal sinus rhythm rate 75 bpm.  ASSESSMENT AND PLAN:  Problem List Items Addressed This Visit    Hypercholesterolemia   Relevant Medications   aspirin 81 MG tablet   Chest pain - Primary   Relevant Orders   Myocardial Perfusion Imaging   EKG 12-Lead      Ms. Chamblees's episode of pain does not sound cardiac given the fact that she had lasting chest wall tenderness after the episode for about 2 weeks.   He was also able to ride her exercise bicycle, although not at a very high exertion level, about a week ago without any chest pain.   There are no EKG changes suggesting she had an MI.   She does seemed nervous  about the episode was instructed to come here by her primary care provider. She does have family history of coronary disease so will repeat her treadmill Cardiolite that she had in 2014.   I have instructed her to go to the emergency room if she has a similar episode.

## 2015-08-28 NOTE — Patient Instructions (Signed)
Medication Instructions:  Your physician recommends that you continue on your current medications as directed. Please refer to the Current Medication list given to you today.  Labwork: NONE  Testing/Procedures: Your physician has requested that you have en exercise stress myoview. For further information please visit [email or web address]. Please follow instruction sheet, as given.  Follow-Up: AS NEEDED   If you need a refill on your cardiac medications before your next appointment, please call your pharmacy.  

## 2015-09-03 ENCOUNTER — Telehealth (HOSPITAL_COMMUNITY): Payer: Self-pay

## 2015-09-03 NOTE — Telephone Encounter (Signed)
Encounter complete. 

## 2015-09-04 ENCOUNTER — Ambulatory Visit (HOSPITAL_COMMUNITY)
Admission: RE | Admit: 2015-09-04 | Discharge: 2015-09-04 | Disposition: A | Discharge status: Home / Self Care | Payer: 59 | Source: Ambulatory Visit | Attending: Cardiovascular Disease | Admitting: Cardiovascular Disease

## 2015-09-04 DIAGNOSIS — R079 Chest pain, unspecified: Secondary | ICD-10-CM | POA: Diagnosis not present

## 2015-09-04 DIAGNOSIS — Z8249 Family history of ischemic heart disease and other diseases of the circulatory system: Secondary | ICD-10-CM | POA: Insufficient documentation

## 2015-09-04 DIAGNOSIS — R5383 Other fatigue: Secondary | ICD-10-CM | POA: Insufficient documentation

## 2015-09-04 LAB — MYOCARDIAL PERFUSION IMAGING
CHL CUP RESTING HR STRESS: 53 {beats}/min
CHL RATE OF PERCEIVED EXERTION: 17
CSEPEW: 9.3 METS
CSEPHR: 95 %
CSEPPHR: 151 {beats}/min
Exercise duration (min): 7 min
Exercise duration (sec): 31 s
LVDIAVOL: 62 mL (ref 46–106)
LVSYSVOL: 19 mL
MPHR: 158 {beats}/min
NUC STRESS TID: 1
SDS: 7
SRS: 1
SSS: 8

## 2015-09-04 MED ORDER — TECHNETIUM TC 99M SESTAMIBI GENERIC - CARDIOLITE
10.6000 | Freq: Once | INTRAVENOUS | Status: AC | PRN
  Administered 2015-09-04: 10.6 via INTRAVENOUS

## 2015-09-04 MED ORDER — TECHNETIUM TC 99M SESTAMIBI GENERIC - CARDIOLITE
31.2000 | Freq: Once | INTRAVENOUS | Status: AC | PRN
Start: 2015-09-04 — End: 2015-09-04
  Administered 2015-09-04: 31.2 via INTRAVENOUS

## 2015-09-05 ENCOUNTER — Telehealth: Payer: Self-pay | Admitting: Physician Assistant

## 2015-09-05 NOTE — Telephone Encounter (Signed)
Informed pt of results. Pt verbalized understanding. 

## 2015-09-05 NOTE — Telephone Encounter (Signed)
/  fu  Pt returning RN phone call- my0oview results. Please call back and discuss.

## 2015-09-12 MED FILL — MORPHINE SULF ER 30 MG TAB: 30 | 30 days supply | Qty: 60 | Fill #0

## 2015-09-18 DIAGNOSIS — L570 Actinic keratosis: Secondary | ICD-10-CM | POA: Diagnosis not present

## 2015-09-18 DIAGNOSIS — L814 Other melanin hyperpigmentation: Secondary | ICD-10-CM | POA: Diagnosis not present

## 2015-09-18 DIAGNOSIS — L821 Other seborrheic keratosis: Secondary | ICD-10-CM | POA: Diagnosis not present

## 2015-09-18 DIAGNOSIS — C44612 Basal cell carcinoma of skin of right upper limb, including shoulder: Secondary | ICD-10-CM | POA: Diagnosis not present

## 2015-09-18 DIAGNOSIS — D225 Melanocytic nevi of trunk: Secondary | ICD-10-CM | POA: Diagnosis not present

## 2015-09-18 DIAGNOSIS — D485 Neoplasm of uncertain behavior of skin: Secondary | ICD-10-CM | POA: Diagnosis not present

## 2015-09-18 DIAGNOSIS — L720 Epidermal cyst: Secondary | ICD-10-CM | POA: Diagnosis not present

## 2015-09-18 DIAGNOSIS — D2239 Melanocytic nevi of other parts of face: Secondary | ICD-10-CM | POA: Diagnosis not present

## 2015-09-18 DIAGNOSIS — L738 Other specified follicular disorders: Secondary | ICD-10-CM | POA: Diagnosis not present

## 2015-09-18 DIAGNOSIS — C44519 Basal cell carcinoma of skin of other part of trunk: Secondary | ICD-10-CM | POA: Diagnosis not present

## 2015-09-18 MED FILL — RELPAX 40 MG TABLET: 40 | 30 days supply | Qty: 9 | Fill #0

## 2015-09-26 MED FILL — FENOFIBRATE 160 MG TABLET: 160 | 90 days supply | Qty: 90 | Fill #0

## 2015-09-26 MED FILL — rOPINIRole HCL 0.5 MG TABS: 0.5 | 90 days supply | Qty: 90 | Fill #0

## 2015-09-26 MED FILL — SIMVASTATIN 40 MG TABLET: 40 | 90 days supply | Qty: 90 | Fill #1

## 2015-09-26 MED FILL — SYNTHROID 150 MCG TABLET: 150 | 90 days supply | Qty: 90 | Fill #1

## 2015-10-15 MED FILL — TRANSDERM-SCOP 1.5 MG/72HR: 1 | 12 days supply | Qty: 4 | Fill #0

## 2015-10-21 MED FILL — ESCITALOPRAM 20 MG TABLET: 20 | 90 days supply | Qty: 90 | Fill #1

## 2015-11-05 DIAGNOSIS — Z1231 Encounter for screening mammogram for malignant neoplasm of breast: Secondary | ICD-10-CM | POA: Diagnosis not present

## 2015-11-05 DIAGNOSIS — Z803 Family history of malignant neoplasm of breast: Secondary | ICD-10-CM | POA: Diagnosis not present

## 2015-11-08 MED FILL — MORPHINE SULF ER 30 MG TAB: 30 | 30 days supply | Qty: 60 | Fill #0

## 2015-11-19 ENCOUNTER — Encounter (HOSPITAL_COMMUNITY)
Admission: EM | Disposition: A | Discharge status: Home Health | Payer: Self-pay | Source: Home / Self Care | Attending: Pulmonary Disease

## 2015-11-19 ENCOUNTER — Emergency Department (HOSPITAL_COMMUNITY): Payer: 59

## 2015-11-19 ENCOUNTER — Inpatient Hospital Stay (HOSPITAL_COMMUNITY)
Admission: EM | Admit: 2015-11-19 | Discharge: 2015-11-25 | DRG: 280 | Disposition: A | Discharge status: Home Health | Payer: 59 | Attending: Internal Medicine | Admitting: Internal Medicine

## 2015-11-19 ENCOUNTER — Inpatient Hospital Stay (HOSPITAL_COMMUNITY): Payer: 59

## 2015-11-19 ENCOUNTER — Encounter (HOSPITAL_COMMUNITY): Payer: Self-pay | Admitting: Pulmonary Disease

## 2015-11-19 ENCOUNTER — Ambulatory Visit (HOSPITAL_COMMUNITY): Admit: 2015-11-19 | Payer: Self-pay | Admitting: Interventional Cardiology

## 2015-11-19 DIAGNOSIS — Z7982 Long term (current) use of aspirin: Secondary | ICD-10-CM | POA: Diagnosis not present

## 2015-11-19 DIAGNOSIS — Z881 Allergy status to other antibiotic agents status: Secondary | ICD-10-CM

## 2015-11-19 DIAGNOSIS — Z96652 Presence of left artificial knee joint: Secondary | ICD-10-CM | POA: Diagnosis present

## 2015-11-19 DIAGNOSIS — N179 Acute kidney failure, unspecified: Secondary | ICD-10-CM | POA: Diagnosis present

## 2015-11-19 DIAGNOSIS — Z452 Encounter for adjustment and management of vascular access device: Secondary | ICD-10-CM

## 2015-11-19 DIAGNOSIS — E872 Acidosis, unspecified: Secondary | ICD-10-CM | POA: Diagnosis present

## 2015-11-19 DIAGNOSIS — D649 Anemia, unspecified: Secondary | ICD-10-CM | POA: Diagnosis present

## 2015-11-19 DIAGNOSIS — E039 Hypothyroidism, unspecified: Secondary | ICD-10-CM | POA: Diagnosis present

## 2015-11-19 DIAGNOSIS — I5021 Acute systolic (congestive) heart failure: Secondary | ICD-10-CM | POA: Diagnosis not present

## 2015-11-19 DIAGNOSIS — I341 Nonrheumatic mitral (valve) prolapse: Secondary | ICD-10-CM | POA: Diagnosis present

## 2015-11-19 DIAGNOSIS — E7800 Pure hypercholesterolemia, unspecified: Secondary | ICD-10-CM | POA: Diagnosis present

## 2015-11-19 DIAGNOSIS — E785 Hyperlipidemia, unspecified: Secondary | ICD-10-CM | POA: Diagnosis present

## 2015-11-19 DIAGNOSIS — G2581 Restless legs syndrome: Secondary | ICD-10-CM | POA: Diagnosis present

## 2015-11-19 DIAGNOSIS — J9601 Acute respiratory failure with hypoxia: Secondary | ICD-10-CM | POA: Diagnosis not present

## 2015-11-19 DIAGNOSIS — E876 Hypokalemia: Secondary | ICD-10-CM | POA: Diagnosis present

## 2015-11-19 DIAGNOSIS — I517 Cardiomegaly: Secondary | ICD-10-CM | POA: Diagnosis not present

## 2015-11-19 DIAGNOSIS — I2510 Atherosclerotic heart disease of native coronary artery without angina pectoris: Secondary | ICD-10-CM | POA: Diagnosis present

## 2015-11-19 DIAGNOSIS — Z4682 Encounter for fitting and adjustment of non-vascular catheter: Secondary | ICD-10-CM | POA: Diagnosis not present

## 2015-11-19 DIAGNOSIS — G931 Anoxic brain damage, not elsewhere classified: Secondary | ICD-10-CM | POA: Diagnosis present

## 2015-11-19 DIAGNOSIS — M549 Dorsalgia, unspecified: Secondary | ICD-10-CM | POA: Diagnosis present

## 2015-11-19 DIAGNOSIS — Z86718 Personal history of other venous thrombosis and embolism: Secondary | ICD-10-CM

## 2015-11-19 DIAGNOSIS — I469 Cardiac arrest, cause unspecified: Secondary | ICD-10-CM | POA: Diagnosis present

## 2015-11-19 DIAGNOSIS — E1165 Type 2 diabetes mellitus with hyperglycemia: Secondary | ICD-10-CM | POA: Diagnosis present

## 2015-11-19 DIAGNOSIS — I214 Non-ST elevation (NSTEMI) myocardial infarction: Secondary | ICD-10-CM | POA: Diagnosis present

## 2015-11-19 DIAGNOSIS — Z79899 Other long term (current) drug therapy: Secondary | ICD-10-CM | POA: Diagnosis not present

## 2015-11-19 DIAGNOSIS — G8929 Other chronic pain: Secondary | ICD-10-CM | POA: Diagnosis present

## 2015-11-19 DIAGNOSIS — J690 Pneumonitis due to inhalation of food and vomit: Secondary | ICD-10-CM | POA: Diagnosis present

## 2015-11-19 DIAGNOSIS — I462 Cardiac arrest due to underlying cardiac condition: Secondary | ICD-10-CM | POA: Diagnosis not present

## 2015-11-19 DIAGNOSIS — Z885 Allergy status to narcotic agent status: Secondary | ICD-10-CM | POA: Diagnosis not present

## 2015-11-19 DIAGNOSIS — I4901 Ventricular fibrillation: Secondary | ICD-10-CM | POA: Diagnosis present

## 2015-11-19 DIAGNOSIS — G40909 Epilepsy, unspecified, not intractable, without status epilepticus: Secondary | ICD-10-CM | POA: Diagnosis present

## 2015-11-19 DIAGNOSIS — R739 Hyperglycemia, unspecified: Secondary | ICD-10-CM | POA: Diagnosis present

## 2015-11-19 DIAGNOSIS — Z978 Presence of other specified devices: Secondary | ICD-10-CM

## 2015-11-19 DIAGNOSIS — W19XXXA Unspecified fall, initial encounter: Secondary | ICD-10-CM

## 2015-11-19 DIAGNOSIS — Z8249 Family history of ischemic heart disease and other diseases of the circulatory system: Secondary | ICD-10-CM

## 2015-11-19 DIAGNOSIS — Z86711 Personal history of pulmonary embolism: Secondary | ICD-10-CM | POA: Diagnosis not present

## 2015-11-19 DIAGNOSIS — R0602 Shortness of breath: Secondary | ICD-10-CM | POA: Diagnosis not present

## 2015-11-19 DIAGNOSIS — I219 Acute myocardial infarction, unspecified: Secondary | ICD-10-CM

## 2015-11-19 DIAGNOSIS — R570 Cardiogenic shock: Secondary | ICD-10-CM | POA: Diagnosis present

## 2015-11-19 DIAGNOSIS — M47812 Spondylosis without myelopathy or radiculopathy, cervical region: Secondary | ICD-10-CM | POA: Diagnosis not present

## 2015-11-19 DIAGNOSIS — Z4659 Encounter for fitting and adjustment of other gastrointestinal appliance and device: Secondary | ICD-10-CM

## 2015-11-19 HISTORY — DX: Acute myocardial infarction, unspecified: I21.9

## 2015-11-19 HISTORY — DX: Other pulmonary embolism without acute cor pulmonale: I26.99

## 2015-11-19 LAB — RAPID URINE DRUG SCREEN, HOSP PERFORMED
AMPHETAMINES: NOT DETECTED
BENZODIAZEPINES: POSITIVE — AB
Barbiturates: NOT DETECTED
COCAINE: NOT DETECTED
Opiates: POSITIVE — AB
Tetrahydrocannabinol: NOT DETECTED

## 2015-11-19 LAB — URINALYSIS, ROUTINE W REFLEX MICROSCOPIC
BILIRUBIN URINE: NEGATIVE
GLUCOSE, UA: 250 mg/dL — AB
Ketones, ur: NEGATIVE mg/dL
Leukocytes, UA: NEGATIVE
Nitrite: NEGATIVE
PH: 6.5 (ref 5.0–8.0)
Protein, ur: 100 mg/dL — AB
SPECIFIC GRAVITY, URINE: 1.014 (ref 1.005–1.030)

## 2015-11-19 LAB — DIFFERENTIAL
BASOS ABS: 0 10*3/uL (ref 0.0–0.1)
Basophils Relative: 0 %
Eosinophils Absolute: 0.1 10*3/uL (ref 0.0–0.7)
Eosinophils Relative: 1 %
LYMPHS ABS: 6.8 10*3/uL — AB (ref 0.7–4.0)
Lymphocytes Relative: 61 %
MONO ABS: 0.6 10*3/uL (ref 0.1–1.0)
Monocytes Relative: 5 %
NEUTROS ABS: 3.7 10*3/uL (ref 1.7–7.7)
NEUTROS PCT: 33 %

## 2015-11-19 LAB — I-STAT ARTERIAL BLOOD GAS, ED
Acid-base deficit: 7 mmol/L — ABNORMAL HIGH (ref 0.0–2.0)
BICARBONATE: 20.1 meq/L (ref 20.0–24.0)
O2 SAT: 100 %
PCO2 ART: 45.1 mmHg — AB (ref 35.0–45.0)
PH ART: 7.257 — AB (ref 7.350–7.450)
PO2 ART: 240 mmHg — AB (ref 80.0–100.0)
Patient temperature: 98.6
TCO2: 21 mmol/L (ref 0–100)

## 2015-11-19 LAB — CBC
HCT: 37.8 % (ref 36.0–46.0)
Hemoglobin: 11.9 g/dL — ABNORMAL LOW (ref 12.0–15.0)
MCH: 27.9 pg (ref 26.0–34.0)
MCHC: 31.5 g/dL (ref 30.0–36.0)
MCV: 88.5 fL (ref 78.0–100.0)
Platelets: 367 10*3/uL (ref 150–400)
RBC: 4.27 MIL/uL (ref 3.87–5.11)
RDW: 14.6 % (ref 11.5–15.5)
WBC: 11.2 10*3/uL — ABNORMAL HIGH (ref 4.0–10.5)

## 2015-11-19 LAB — COMPREHENSIVE METABOLIC PANEL
ALBUMIN: 3.8 g/dL (ref 3.5–5.0)
ALK PHOS: 55 U/L (ref 38–126)
ALT: 96 U/L — ABNORMAL HIGH (ref 14–54)
ANION GAP: 14 (ref 5–15)
AST: 202 U/L — AB (ref 15–41)
BUN: 19 mg/dL (ref 6–20)
CO2: 18 mmol/L — AB (ref 22–32)
Calcium: 9.1 mg/dL (ref 8.9–10.3)
Chloride: 107 mmol/L (ref 101–111)
Creatinine, Ser: 1.32 mg/dL — ABNORMAL HIGH (ref 0.44–1.00)
GFR calc Af Amer: 49 mL/min — ABNORMAL LOW (ref >60)
GFR calc non Af Amer: 42 mL/min — ABNORMAL LOW (ref >60)
GLUCOSE: 229 mg/dL — AB (ref 65–99)
POTASSIUM: 3.5 mmol/L (ref 3.5–5.1)
SODIUM: 139 mmol/L (ref 135–145)
Total Bilirubin: 0.3 mg/dL (ref 0.3–1.2)
Total Protein: 6.5 g/dL (ref 6.5–8.1)

## 2015-11-19 LAB — I-STAT CHEM 8, ED
BUN: 23 mg/dL — ABNORMAL HIGH (ref 6–20)
CALCIUM ION: 1.11 mmol/L — AB (ref 1.13–1.30)
Chloride: 104 mmol/L (ref 101–111)
Creatinine, Ser: 1.2 mg/dL — ABNORMAL HIGH (ref 0.44–1.00)
Glucose, Bld: 223 mg/dL — ABNORMAL HIGH (ref 65–99)
HCT: 40 % (ref 36.0–46.0)
HEMOGLOBIN: 13.6 g/dL (ref 12.0–15.0)
Potassium: 3.6 mmol/L (ref 3.5–5.1)
SODIUM: 141 mmol/L (ref 135–145)
TCO2: 20 mmol/L (ref 0–100)

## 2015-11-19 LAB — I-STAT CG4 LACTIC ACID, ED: LACTIC ACID, VENOUS: 7.2 mmol/L — AB (ref 0.5–2.0)

## 2015-11-19 LAB — TYPE AND SCREEN
ABO/RH(D): A POS
Antibody Screen: NEGATIVE

## 2015-11-19 LAB — URINE MICROSCOPIC-ADD ON

## 2015-11-19 LAB — APTT: aPTT: 24 seconds (ref 24–37)

## 2015-11-19 LAB — I-STAT TROPONIN, ED: TROPONIN I, POC: 0.37 ng/mL — AB (ref 0.00–0.08)

## 2015-11-19 LAB — LIPID PANEL
CHOL/HDL RATIO: 2.7 ratio
Cholesterol: 181 mg/dL (ref 0–200)
HDL: 66 mg/dL (ref >40)
LDL CALC: 94 mg/dL (ref 0–99)
Triglycerides: 103 mg/dL (ref <150)
VLDL: 21 mg/dL (ref 0–40)

## 2015-11-19 LAB — PROTIME-INR
INR: 1.1 (ref 0.00–1.49)
PROTHROMBIN TIME: 14.4 s (ref 11.6–15.2)

## 2015-11-19 LAB — ABO/RH: ABO/RH(D): A POS

## 2015-11-19 LAB — TRIGLYCERIDES: TRIGLYCERIDES: 105 mg/dL (ref <150)

## 2015-11-19 SURGERY — INVASIVE LAB ABORTED CASE

## 2015-11-19 MED ORDER — FENTANYL BOLUS VIA INFUSION
25.0000 ug | INTRAVENOUS | Status: DC | PRN
  Filled 2015-11-19: qty 50

## 2015-11-19 MED ORDER — ASPIRIN 81 MG PO CHEW: 324.0000 mg | CHEWABLE_TABLET | ORAL | Status: AC

## 2015-11-19 MED ORDER — IOPAMIDOL (ISOVUE-370) INJECTION 76%
INTRAVENOUS | Status: AC
  Filled 2015-11-19: qty 100

## 2015-11-19 MED ORDER — MIDAZOLAM HCL 2 MG/2ML IJ SOLN
1.0000 mg | INTRAMUSCULAR | Status: DC | PRN
  Administered 2015-11-21: 4 mg via INTRAVENOUS
  Filled 2015-11-19: qty 4

## 2015-11-19 MED ORDER — PROPOFOL 1000 MG/100ML IV EMUL
INTRAVENOUS | Status: AC
  Filled 2015-11-19: qty 100

## 2015-11-19 MED ORDER — HEPARIN (PORCINE) IN NACL 100-0.45 UNIT/ML-% IJ SOLN
1200.0000 [IU]/h | INTRAMUSCULAR | Status: DC
  Administered 2015-11-19: 1100 [IU]/h via INTRAVENOUS
  Filled 2015-11-19: qty 250

## 2015-11-19 MED ORDER — SODIUM CHLORIDE 0.9 % IV BOLUS (SEPSIS)
1000.0000 mL | Freq: Once | INTRAVENOUS | Status: AC
  Administered 2015-11-19: 1000 mL via INTRAVENOUS

## 2015-11-19 MED ORDER — FENTANYL BOLUS VIA INFUSION: 50.0000 ug | INTRAVENOUS | Status: DC | PRN

## 2015-11-19 MED ORDER — ATORVASTATIN CALCIUM 80 MG PO TABS
80.0000 mg | ORAL_TABLET | Freq: Every day | ORAL | Status: DC
  Administered 2015-11-21 – 2015-11-24 (×3): 80 mg via NASOGASTRIC
  Filled 2015-11-19 (×3): qty 1

## 2015-11-19 MED ORDER — SODIUM CHLORIDE 0.9 % IV SOLN: 25.0000 ug/h | INTRAVENOUS | Status: DC

## 2015-11-19 MED ORDER — FENTANYL BOLUS VIA INFUSION
50.0000 ug | INTRAVENOUS | Status: DC | PRN
  Filled 2015-11-19: qty 50

## 2015-11-19 MED ORDER — HEPARIN BOLUS VIA INFUSION
4000.0000 [IU] | Freq: Once | INTRAVENOUS | Status: AC
  Administered 2015-11-19: 4000 [IU] via INTRAVENOUS
  Filled 2015-11-19: qty 4000

## 2015-11-19 MED ORDER — ETOMIDATE 2 MG/ML IV SOLN
20.0000 mg | Freq: Once | INTRAVENOUS | Status: AC
  Administered 2015-11-19: 20 mg via INTRAVENOUS

## 2015-11-19 MED ORDER — FENTANYL CITRATE (PF) 100 MCG/2ML IJ SOLN: 100.0000 ug | INTRAMUSCULAR | Status: DC | PRN

## 2015-11-19 MED ORDER — FENTANYL CITRATE (PF) 100 MCG/2ML IJ SOLN: 50.0000 ug | Freq: Once | INTRAMUSCULAR | Status: DC

## 2015-11-19 MED ORDER — PROPOFOL 1000 MG/100ML IV EMUL
0.0000 ug/kg/min | INTRAVENOUS | Status: DC
  Administered 2015-11-19: 15 ug/kg/min via INTRAVENOUS
  Administered 2015-11-20: 40 ug/kg/min via INTRAVENOUS
  Filled 2015-11-19 (×3): qty 100

## 2015-11-19 MED ORDER — SODIUM CHLORIDE 0.9 % IV SOLN: 250.0000 mL | INTRAVENOUS | Status: DC | PRN

## 2015-11-19 MED ORDER — SODIUM CHLORIDE 0.9 % IV SOLN
25.0000 ug/h | INTRAVENOUS | Status: DC
  Administered 2015-11-19: 150 ug/h via INTRAVENOUS
  Administered 2015-11-20: 200 ug/h via INTRAVENOUS
  Filled 2015-11-19 (×3): qty 50

## 2015-11-19 MED ORDER — INSULIN ASPART 100 UNIT/ML ~~LOC~~ SOLN
0.0000 [IU] | SUBCUTANEOUS | Status: DC
  Administered 2015-11-20 (×4): 1 [IU] via SUBCUTANEOUS

## 2015-11-19 MED ORDER — FAMOTIDINE IN NACL 20-0.9 MG/50ML-% IV SOLN
20.0000 mg | INTRAVENOUS | Status: DC
  Administered 2015-11-20: 20 mg via INTRAVENOUS
  Filled 2015-11-19: qty 50

## 2015-11-19 MED ORDER — SUCCINYLCHOLINE CHLORIDE 20 MG/ML IJ SOLN
100.0000 mg | Freq: Once | INTRAMUSCULAR | Status: AC
  Administered 2015-11-19: 100 mg via INTRAVENOUS
  Filled 2015-11-19: qty 5

## 2015-11-19 MED ORDER — ASPIRIN 300 MG RE SUPP
300.0000 mg | Freq: Every day | RECTAL | Status: DC
  Administered 2015-11-20: 300 mg via RECTAL
  Filled 2015-11-19: qty 1

## 2015-11-19 MED ORDER — NOREPINEPHRINE BITARTRATE 1 MG/ML IV SOLN
0.0000 ug/min | INTRAVENOUS | Status: DC
  Administered 2015-11-19: 10 ug/min via INTRAVENOUS
  Filled 2015-11-19 (×2): qty 4

## 2015-11-19 MED ORDER — SODIUM CHLORIDE 0.9 % IV SOLN: 10.0000 mL/h | INTRAVENOUS | Status: DC

## 2015-11-19 MED ORDER — PROPOFOL 1000 MG/100ML IV EMUL
0.0000 ug/kg/min | INTRAVENOUS | Status: DC
  Administered 2015-11-19: 30 ug/kg/min via INTRAVENOUS

## 2015-11-19 MED ORDER — FENTANYL CITRATE (PF) 100 MCG/2ML IJ SOLN
INTRAMUSCULAR | Status: AC
  Administered 2015-11-19: 100 ug
  Filled 2015-11-19: qty 2

## 2015-11-19 MED ORDER — SODIUM CHLORIDE 0.9 % IV SOLN
INTRAVENOUS | Status: DC
  Administered 2015-11-19 – 2015-11-20 (×2): via INTRAVENOUS

## 2015-11-19 MED ORDER — ASPIRIN 300 MG RE SUPP: 300.0000 mg | RECTAL | Status: AC

## 2015-11-19 SURGICAL SUPPLY — 5 items
KIT ENCORE 26 ADVANTAGE (KITS) IMPLANT
KIT HEART LEFT (KITS) ×1 IMPLANT
PACK CARDIAC CATHETERIZATION (CUSTOM PROCEDURE TRAY) ×1 IMPLANT
TRANSDUCER W/STOPCOCK (MISCELLANEOUS) ×1 IMPLANT
TUBING CIL FLEX 10 FLL-RA (TUBING) ×1 IMPLANT

## 2015-11-19 NOTE — ED Provider Notes (Signed)
CSN: [id number]     Arrival date & time 11/19/15  2026 History   First MD Initiated Contact with Patient 11/19/15 2031     Chief Complaint  Patient presents with  . Post CPR    HPI Patient presented to the emergency room after cardiac arrest. History is limited. Family is not available at this time.  According to the EMS reports, the family heard the patient fall at home. She was found to be unresponsive. 911 was called and CPR was initiated. The patient was given ACLS resuscitation including chest compressions, defibrillation and medications.  They had return of spontaneous circulation. Initial EKG was concerning for possible ST elevation MI. Patient presented to the ED with a blood pressure and pulse.  She is moving her extremities but not purposefully responding to voice. Past Medical History  Diagnosis Date  . Hypothyroidism   . Hypercholesterolemia   . RLS (restless legs syndrome)   . PONV (postoperative nausea and vomiting)   . Chronic back pain   . MVP (mitral valve prolapse)     asymptomatic  . Dyspnea on exertion   . PE (pulmonary embolism) Apr 18, 2013    tx. -using Xarelto now, no further lung problems, denies SOB on 01-02-14  . Headache(784.0)     migraines, 1-2 every 2 months  . Arthritis     osteoarthritis-knees. Chronic back pain  . DVT (deep venous thrombosis) ([place])     01-02-14 hx. left lower leg, resulted in Pulmonary emboli   Past Surgical History  Procedure Laterality Date  . Abdominal hysterectomy      TAH/BSO  . Knee surgery Left '76 AND '81    ACL....   . Colonoscopy  09/28/2011    Procedure: COLONOSCOPY;  Surgeon: Juanita Craver, MD;  Location: [facility];  Service: Endoscopy;  Laterality: N/A;  . Total knee arthroplasty Left 01/08/2014    Procedure: LEFT TOTAL KNEE ARTHROPLASTY;  Surgeon: Gearlean Alf, MD;  Location: [facility];  Service: Orthopedics;  Laterality: Left;   Family History  Problem Relation Age of Onset  . Hypertension Mother   . Heart disease  Mother   . Heart disease Father   . Hypertension Father   . Stroke Brother     DIED AT 20  . Cancer Maternal Aunt     lung, colon  . Breast cancer Maternal Aunt   . Breast cancer Paternal Aunt    Social History  Substance Use Topics  . Smoking status: Never Smoker   . Smokeless tobacco: Never Used  . Alcohol Use: Yes     Comment: 2-3 times a year, one drink at at time   OB History    Gravida Para Term Preterm AB TAB SAB Ectopic Multiple Living   0              Review of Systems  Unable to perform ROS: Patient nonverbal      Allergies  Codeine and Erythromycin  Home Medications   Prior to Admission medications   Medication Sig Start Date End Date Taking? Authorizing Provider  aspirin 81 MG tablet Take 81 mg by mouth daily.    Historical Provider, MD  cyclobenzaprine (FLEXERIL) 5 MG tablet Take 5 mg by mouth daily as needed for muscle spasms.  08/22/15   Historical Provider, MD  diclofenac (VOLTAREN) 75 MG EC tablet Take 75 mg by mouth daily. 08/22/15   Historical Provider, MD  eletriptan (RELPAX) 40 MG tablet Take 40 mg by mouth as needed  for migraine. One tablet by mouth at onset of headache. May repeat in 2 hours if headache persists or recurs.    Historical Provider, MD  escitalopram (LEXAPRO) 20 MG tablet Take 20 mg by mouth every morning.     Historical Provider, MD  fenofibrate 160 MG tablet Take 160 mg by mouth daily. Takes at bedtime    Historical Provider, MD  levothyroxine (SYNTHROID, LEVOTHROID) 150 MCG tablet Take 150 mcg by mouth daily before breakfast.     Historical Provider, MD  morphine (MSIR) 30 MG tablet Take 30 mg by mouth every 12 (twelve) hours.    Historical Provider, MD  omega-3 acid ethyl esters (LOVAZA) 1 G capsule Take 2 g by mouth 2 (two) times daily.    Historical Provider, MD  ondansetron (ZOFRAN-ODT) 8 MG disintegrating tablet Take 8 mg by mouth every 8 (eight) hours as needed for nausea.    Historical Provider, MD  rOPINIRole (REQUIP) 0.5 MG  tablet Take 0.5 mg by mouth every evening.     Historical Provider, MD  simvastatin (ZOCOR) 40 MG tablet Take 40 mg by mouth every evening.    Historical Provider, MD   BP 94/68 mmHg  Pulse 95  Temp(Src) 97.7 F (36.5 C)  Resp 20  Ht 5\' 9"  (1.753 m)  Wt 85.73 kg  BMI 27.90 kg/m2  SpO2 100% Physical Exam  Constitutional:  Unresponsive  HENT:  Head: Normocephalic and atraumatic.  Right Ear: External ear normal.  Left Ear: External ear normal.  Eyes: Conjunctivae are normal. Right eye exhibits no discharge. Left eye exhibits no discharge. No scleral icterus.  Neck: Neck supple. No tracheal deviation present.  Cardiovascular: Normal rate, regular rhythm and normal heart sounds.   Pulmonary/Chest: Effort normal and breath sounds normal. No stridor. No respiratory distress. She has no wheezes. She has no rales.  Patient has a King airway in place, breath sounds  Abdominal: Soft. Bowel sounds are normal. She exhibits no distension. There is no tenderness. There is no rebound and no guarding.  Genitourinary:  Normal external genitalia  Musculoskeletal: She exhibits no edema or tenderness.  Neurological: Cranial nerve deficit: no gross deficits.  The patient does not spontaneously open her eyes, no purposeful movement in her extremities but she is intermittently withdrawing her arms  Skin: Skin is warm and dry. No rash noted.  Psychiatric: She has a normal mood and affect.  Nursing note and vitals reviewed.   ED Course  .Intubation Date/Time: 11/19/2015 9:43 PM Performed by: Dorie Rank Authorized by: Dorie Rank Consent: The procedure was performed in an emergent situation. Indications: airway protection Intubation method: video-assisted Patient status: unconscious Preoxygenation: king airway. Sedatives: etomidate Paralytic: succinylcholine Laryngoscope size: Mac 4 Number of attempts: 1 Cricoid pressure: yes Cords visualized: yes Post-procedure assessment: ETCO2  monitor Breath sounds: equal Cuff inflated: no ETT to lip: 24 cm Tube secured with: ETT holder Chest x-ray interpreted by radiologist. Chest x-ray findings: endotracheal tube in appropriate position Patient tolerance: Patient tolerated the procedure well with no immediate complications  .Critical Care Performed by: Dorie Rank Authorized by: Dorie Rank Total critical care time: 35 minutes Critical care was necessary to treat or prevent imminent or life-threatening deterioration of the following conditions: respiratory failure. Critical care was time spent personally by me on the following activities: blood draw for specimens, discussions with primary provider, discussions with consultants, examination of patient, evaluation of patient's response to treatment, obtaining history from patient or surrogate, ordering and performing treatments and interventions, ordering and  review of laboratory studies, ordering and review of radiographic studies, pulse oximetry and re-evaluation of patient's condition.   (including critical care time) Labs Review Labs Reviewed  CBC - Abnormal; Notable for the following:    WBC 11.2 (*)    Hemoglobin 11.9 (*)    All other components within normal limits  DIFFERENTIAL - Abnormal; Notable for the following:    Lymphs Abs 6.8 (*)    All other components within normal limits  COMPREHENSIVE METABOLIC PANEL - Abnormal; Notable for the following:    CO2 18 (*)    Glucose, Bld 229 (*)    Creatinine, Ser 1.32 (*)    AST 202 (*)    ALT 96 (*)    GFR calc non Af Amer 42 (*)    GFR calc Af Amer 49 (*)    All other components within normal limits  I-STAT CHEM 8, ED - Abnormal; Notable for the following:    BUN 23 (*)    Creatinine, Ser 1.20 (*)    Glucose, Bld 223 (*)    Calcium, Ion 1.11 (*)    All other components within normal limits  I-STAT TROPOININ, ED - Abnormal; Notable for the following:    Troponin i, poc 0.37 (*)    All other components within  normal limits  I-STAT ARTERIAL BLOOD GAS, ED - Abnormal; Notable for the following:    pH, Arterial 7.257 (*)    pCO2 arterial 45.1 (*)    pO2, Arterial 240.0 (*)    Acid-base deficit 7.0 (*)    All other components within normal limits  I-STAT CG4 LACTIC ACID, ED - Abnormal; Notable for the following:    Lactic Acid, Venous 7.20 (*)    All other components within normal limits  TRIGLYCERIDES  PROTIME-INR  APTT  LIPID PANEL  ETHANOL  URINE RAPID DRUG SCREEN, HOSP PERFORMED  URINALYSIS, ROUTINE W REFLEX MICROSCOPIC (NOT AT [facility])  TYPE AND SCREEN    Imaging Review Dg Chest Port 1 View  11/19/2015  CLINICAL DATA:  Post CPR.  History of mitral valve prolapse. EXAM: PORTABLE CHEST 1 VIEW COMPARISON:  Chest x-ray dated 08/21/2015. FINDINGS: Mild cardiomegaly is stable. Endotracheal tube is well positioned with tip approximately 2.5 cm above the carina. Enteric tube passes below the diaphragm. Central pulmonary vascular congestion and bilateral interstitial edema, most confluent within the right upper lung. No pleural effusion or pneumothorax seen. Osseous structures about the chest are unremarkable. IMPRESSION: 1. Mild cardiomegaly with central pulmonary vascular congestion and bilateral interstitial edema indicating mild volume overload and/or sequela of resuscitation efforts. Lungs otherwise clear. No pneumothorax. 2. Endotracheal tube well positioned with tip approximately 2.5 cm above the carina. Electronically Signed   By: Franki Cabot M.D.   On: 11/19/2015 21:09   I have personally reviewed and evaluated these images and lab results as part of my medical decision-making.   EKG Interpretation   Date/Time:  Tuesday November 19 2015 20:33:22 EDT Ventricular Rate:  104 PR Interval:  213 QRS Duration: 104 QT Interval:  363 QTC Calculation: 477 R Axis:   89 Text Interpretation:  Sinus tachycardia Borderline prolonged PR interval  Consider left atrial enlargement Borderline right axis  deviation  Borderline repolarization abnormality Poor data quality st depression  atnerior and inferior leads compared to prior tracing Confirmed by Eran Mistry   MD-J, Priest Lockridge [id number]) on 11/19/2015 8:43:53 PM      MDM   Final diagnoses:  Cardiac arrest ([place])    I was able  to obtain additional history from the family. Patient was at home when they heard a loud thud. Family members found her lying on the floor. She seemed to be gasping. They felt that her tongue rolled back in her mouth. There was questionable shaking-like activity. When members initiated CPR and called 911.  Patient does have any history of seizure disorder.  Symptoms are most concerning for an arrhythmogenic cause.  I ordered a CT scan of the chest because of her history of PE. CT the head is also pending at this point.  I consult with pulmonary critical care. We will hold off on hypothermia protocol right now.  Initial laboratory tests are significant for an elevated lactic acid level elevated troponin. She has a residual metabolic acidosis. Chest x-ray suggestive of cardiomegaly and pulmonary vascular congestion.  Patient will be admitted to the ICU for further evaluation    Dorie Rank, MD 11/19/15 2152

## 2015-11-19 NOTE — ED Notes (Signed)
Patient was given 2 shocks by AED by fire and 2 by EMS.  16 mins of CPR, 2 epi and 2mg  Narcan  Intubated with a King airway adm Fentanyl 122mcg and Versed 5mg   IO removed from right tib fib

## 2015-11-19 NOTE — ED Notes (Signed)
Patient arrived via EMS  Family members heard her hit the floor and started CPR and EMS was called.

## 2015-11-19 NOTE — H&P (Addendum)
PULMONARY / CRITICAL CARE MEDICINE   Name: Brittany Mason MRN: [id number] DOB: [birth year]    ADMISSION DATE:  11/19/2015 CONSULTATION DATE:  11/19/2015  REFERRING MD:  Daneen Schick, M.D. / Cardiology  CHIEF COMPLAINT:  Cardiac Arrest  HISTORY OF PRESENT ILLNESS:  Per the electronic medical record patient had a cardiac arrest at home. CPR was performed by family within 3 minutes of the arrest. A total of 16-20 minutes of CPR was performed before return of spontaneous circulation. Initial rhythm was ventricular fibrillation which deteriorated to a pulseless electrical activity. Patient was intubated in the field by EMS. In the emergency department the patient was noted to have spontaneous movement but was not following commands but reportedly difficult to tell if purposeful. Cardiology evaluated the patient and code STEMI was canceled. Family friend confirms she heard the patient fall prior to 8 PM this evening. She reports she was "gasping" intermittently for air. She had no complaints that she had voiced prior to this acute event. Of note the patient did fly to [facility] on May 13 and returned the same day. The patient also had a three-hour drive which they report they did take intermittent stops to get out of the car.  PAST MEDICAL HISTORY :  Past Medical History  Diagnosis Date  . Hypothyroidism   . Hypercholesterolemia   . RLS (restless legs syndrome)   . PONV (postoperative nausea and vomiting)   . Chronic back pain   . MVP (mitral valve prolapse)     asymptomatic  . Dyspnea on exertion   . PE (pulmonary embolism) Apr 18, 2013    tx. -using Xarelto now, no further lung problems, denies SOB on 01-02-14  . Headache(784.0)     migraines, 1-2 every 2 months  . Arthritis     osteoarthritis-knees. Chronic back pain  . DVT (deep venous thrombosis) ([place])     01-02-14 hx. left lower leg, resulted in Pulmonary emboli on hormone therapy    PAST SURGICAL HISTORY: Past Surgical History   Procedure Laterality Date  . Abdominal hysterectomy      TAH/BSO  . Knee surgery Left '76 AND '81    ACL....   . Colonoscopy  09/28/2011    Procedure: COLONOSCOPY;  Surgeon: Juanita Craver, MD;  Location: [facility];  Service: Endoscopy;  Laterality: N/A;  . Total knee arthroplasty Left 01/08/2014    Procedure: LEFT TOTAL KNEE ARTHROPLASTY;  Surgeon: Gearlean Alf, MD;  Location: [facility];  Service: Orthopedics;  Laterality: Left;     Allergies  Allergen Reactions  . Codeine Nausea And Vomiting    Takes hydrocodone; if takes doses too close together, states "violently throws up"  . Erythromycin     unknown    No current facility-administered medications on file prior to encounter.   Current Outpatient Prescriptions on File Prior to Encounter  Medication Sig  . aspirin 81 MG tablet Take 81 mg by mouth daily.  . cyclobenzaprine (FLEXERIL) 5 MG tablet Take 5 mg by mouth daily as needed for muscle spasms.   . diclofenac (VOLTAREN) 75 MG EC tablet Take 75 mg by mouth daily.  Marland Kitchen eletriptan (RELPAX) 40 MG tablet Take 40 mg by mouth as needed for migraine. One tablet by mouth at onset of headache. May repeat in 2 hours if headache persists or recurs.  Marland Kitchen escitalopram (LEXAPRO) 20 MG tablet Take 20 mg by mouth every morning.   . fenofibrate 160 MG tablet Take 160 mg by mouth daily. Takes at bedtime  .  levothyroxine (SYNTHROID, LEVOTHROID) 150 MCG tablet Take 150 mcg by mouth daily before breakfast.   . morphine (MSIR) 30 MG tablet Take 30 mg by mouth every 12 (twelve) hours.  Marland Kitchen omega-3 acid ethyl esters (LOVAZA) 1 G capsule Take 2 g by mouth 2 (two) times daily.  . ondansetron (ZOFRAN-ODT) 8 MG disintegrating tablet Take 8 mg by mouth every 8 (eight) hours as needed for nausea.  Marland Kitchen rOPINIRole (REQUIP) 0.5 MG tablet Take 0.5 mg by mouth every evening.   . simvastatin (ZOCOR) 40 MG tablet Take 40 mg by mouth every evening.    FAMILY HISTORY:  Family History  Problem Relation Age of Onset  .  Hypertension Mother   . Congestive Heart Failure Mother   . Coronary artery disease Father   . Hypertension Father   . Stroke Brother     DIED AT 41  . Cancer Maternal Aunt     lung, colon  . Breast cancer Maternal Aunt   . Breast cancer Paternal Aunt     SOCIAL HISTORY: Social History   Social History  . Marital Status: Single    Spouse Name: N/A  . Number of Children: N/A  . Years of Education: N/A   Social History Main Topics  . Smoking status: Never Smoker   . Smokeless tobacco: Never Used  . Alcohol Use: Yes     Comment: 2-3 times a year, one drink at at time  . Drug Use: No  . Sexual Activity: No     Comment: HYSTERECTOMY   Other Topics Concern  . Not on file   Social History Narrative   Lives with close friend.    REVIEW OF SYSTEMS:  Unable to assess given intubation and sedation.  SUBJECTIVE: As above.  VITAL SIGNS: BP 94/68 mmHg  Pulse 95  Temp(Src) 97.7 F (36.5 C)  Resp 20  Ht 5\' 9"  (1.753 m)  Wt 189 lb (85.73 kg)  BMI 27.90 kg/m2  SpO2 100%  HEMODYNAMICS:    VENTILATOR SETTINGS: Vent Mode:  [-] PRVC FiO2 (%):  [80 %-100 %] 80 % Set Rate:  [18 bmp] 18 bmp Vt Set:  [530 mL] 530 mL PEEP:  [10 cmH20] 10 cmH20 Plateau Pressure:  [22 cmH20] 22 cmH20  INTAKE / OUTPUT:    PHYSICAL EXAMINATION: General:  Sedated on Propofol drip. No acute distress. Family & friend at bedside.  Integument:  Warm & dry. No rash on exposed skin. No bruising on exposed skin. Lymphatics:  No appreciated cervical or supraclavicular lymphadenoapthy. HEENT:  Moist mucus membranes. No scleral injection or icterus. Endotracheal tube in place. Cardiovascular:  Regular rhythm. Sinus tachycardia on telemetry. No edema.  Pulmonary:  Good aeration & clear to auscultation bilaterally. Symmetric chest wall rise on ventilator. Abdomen: Soft. Normal bowel sounds. Nondistended.  Musculoskeletal:  Normal bulk and tone. No joint deformity or effusion appreciated. Neurological:   Pupils symmetric & reactive. No meningismus. Moving all 4 extremities spontaneously and withdrawing to pain. Attempting to pull out ETT. Psychiatric:  Unable to assess given sedation.  LABS:  BMET  Recent Labs Lab 11/19/15 2035 11/19/15 2041  NA 139 141  K 3.5 3.6  CL 107 104  CO2 18*  --   BUN 19 23*  CREATININE 1.32* 1.20*  GLUCOSE 229* 223*    Electrolytes  Recent Labs Lab 11/19/15 2035  CALCIUM 9.1    CBC  Recent Labs Lab 11/19/15 2035 11/19/15 2041  WBC 11.2*  --   HGB 11.9* 13.6  HCT 37.8 40.0  PLT 367  --     Coag's  Recent Labs Lab 11/19/15 2035  APTT 24  INR 1.10    Sepsis Markers  Recent Labs Lab 11/19/15 2104  LATICACIDVEN 7.20*    ABG  Recent Labs Lab 11/19/15 2116  PHART 7.257*  PCO2ART 45.1*  PO2ART 240.0*    Liver Enzymes  Recent Labs Lab 11/19/15 2035  AST 202*  ALT 96*  ALKPHOS 55  BILITOT 0.3  ALBUMIN 3.8    Cardiac Enzymes No results for input(s): TROPONINI, PROBNP in the last 168 hours.  Glucose No results for input(s): GLUCAP in the last 168 hours.  Imaging Dg Chest Port 1 View  11/19/2015  CLINICAL DATA:  Post CPR.  History of mitral valve prolapse. EXAM: PORTABLE CHEST 1 VIEW COMPARISON:  Chest x-ray dated 08/21/2015. FINDINGS: Mild cardiomegaly is stable. Endotracheal tube is well positioned with tip approximately 2.5 cm above the carina. Enteric tube passes below the diaphragm. Central pulmonary vascular congestion and bilateral interstitial edema, most confluent within the right upper lung. No pleural effusion or pneumothorax seen. Osseous structures about the chest are unremarkable. IMPRESSION: 1. Mild cardiomegaly with central pulmonary vascular congestion and bilateral interstitial edema indicating mild volume overload and/or sequela of resuscitation efforts. Lungs otherwise clear. No pneumothorax. 2. Endotracheal tube well positioned with tip approximately 2.5 cm above the carina. Electronically  Signed   By: Franki Cabot M.D.   On: 11/19/2015 21:09     STUDIES:  PORT CXR 6/13:   endotracheal tube in good position. Enteric tube below the diaphragm. Pulmonary vascular congestion with bilateral interstitial prominence. No pleural effusion appreciated. No focal opacity.  CT Head/Neck 6/13>> CTA CHEST 6/13>>  MICROBIOLOGY: MRSA PCR 6/13 >>  ANTIBIOTICS: NONE  SIGNIFICANT EVENTS: 6/13 - Admit w/ out of hospital arrest  LINES/TUBES: OETT 7.5 6/13 >>  OGT 6/13 >> Foley 6/13 >> PIV x2  DISCUSSION:  65 year old female with out of hospital arrest. History of diabetes mellitus and hyperlipidemia. 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 Recently taken off of Xarelto this year. Patient is having some purposeful movements therefore deferring cooling at this time. Awaiting further results from workup with CT of the head/neck and CT angiogram of the chest.  ASSESSMENT / PLAN:  PULMONARY A: Acute Hypoxic Respiratory Failure - Post arrest w/ pulmonary edema on port cxr. 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 Occurred while on hormone therapy.  P:   Full Vent Support SBT & WUA as mental status allows Stat CTA Chest  CARDIOVASCULAR A:  S/P V Fib/PEA Arrest - At least 16 minutes before ROSC. H/O MVP H/O Hyperlipidemia  P:  Cardiology Following Monitor on telemetry Vitals per unit protocol Heparin drip per pharmacy protocol Trending Troponin I q6hr Checking Complete TTE Checking serum lipid panel Holding Fenofibrate & Zocor  RENAL A:   Lactic Acidosis  P:   Monitoring UOP with Foley Trending electrolytes & renal function daily Replacing electrolytes as indicated Stat Magnesium  GASTROINTESTINAL A:   No acute issues.  P:   NPO Pepcid IV daily  HEMATOLOGIC A:   Leukocytosis - Mild. Likely reactive. Anemia - Mild.  P:  Trending cell counts daily w/ CBC Heparin drip for therapeutic anticoagulation per  pharmacy protocol  INFECTIOUS A:   No acute issues.  P:   Monitor fever & leukocytosis Holding on antibiotics for now  ENDOCRINE A:   Hyperglycemia H/O Hypothyroidism  P:   Checking Hgb A1c Accu-Checks q4hr SSI per Low Dose Algorithm Checking TSH & Free T4 Holding Synthroid  NEUROLOGIC A:   Sedation on Ventilator. H/O Restless Leg Syndrome H/O Headaches  P:   RASS goal: 0 to -1 Fentanyl IV prn Versed gtt & IV prn Propofol gtt Holding Lexapro, Flexeril, Relpax, Requip, & MSIR.   FAMILY  - Updates: Sister and roommate/friend updated at bedside 6/13 by Dr. Ashok Cordia.  - Inter-disciplinary family meet or Palliative Care meeting due by:  6/20  I have spent a total of 37 minutes of critical care time today caring for the patient, updating family at bedside, and reviewing the patient's electronic medical record.  Sonia Baller Ashok Cordia, M.D. [facility] Pulmonary & Critical Care Pager:  [phone] After 3pm or if no response, call [phone] 10:13 PM 11/19/2015

## 2015-11-19 NOTE — Progress Notes (Signed)
11/19/15 2156  Clinical Encounter Type  Visited With Patient and family together;Health care provider  Visit Type Follow-up;Spiritual support;Critical Care;ED  Referral From Nurse  Spiritual Encounters  Spiritual Needs Prayer;Emotional  Stress Factors  Family Stress Factors Health changes   Chaplain responded to a page in the ED. Chaplain met with patient's sister and friends. Chaplain assisted in facilitating medical consultation with the family. Chaplain offered prayer and support. Spiritual care services available as needed.   Adam M Barnes, Chaplain 11/19/2015 9:57 PM  

## 2015-11-19 NOTE — Progress Notes (Signed)
ANTICOAGULATION CONSULT NOTE - Initial Consult  Pharmacy Consult for Heparin Indication: chest pain/ACS  Allergies  Allergen Reactions  . Codeine Nausea And Vomiting    Takes hydrocodone; if takes doses too close together, states "violently throws up"  . Erythromycin Hives and Itching    Patient Measurements: Height: 5\' 9"  (175.3 cm) Weight: 189 lb (85.73 kg) IBW/kg (Calculated) : 66.2  Vital Signs: Temp: 98.2 F (36.8 C) (06/13 2245) BP: 102/64 mmHg (06/13 2200) Pulse Rate: 88 (06/13 2245)  Labs:  Recent Labs  11/19/15 2035 11/19/15 2041  HGB 11.9* 13.6  HCT 37.8 40.0  PLT 367  --   APTT 24  --   LABPROT 14.4  --   INR 1.10  --   CREATININE 1.32* 1.20*    Estimated Creatinine Clearance: 56.8 mL/min (by C-G formula based on Cr of 1.2).   Medical History: Past Medical History  Diagnosis Date  . Hypothyroidism   . Hypercholesterolemia   . RLS (restless legs syndrome)   . PONV (postoperative nausea and vomiting)   . Chronic back pain   . MVP (mitral valve prolapse)     asymptomatic  . Dyspnea on exertion   . PE (pulmonary embolism) Apr 18, 2013    tx. -using Xarelto now, no further lung problems, denies SOB on 01-02-14  . Headache(784.0)     migraines, 1-2 every 2 months  . Arthritis     osteoarthritis-knees. Chronic back pain  . DVT (deep venous thrombosis) ([place])     01-02-14 hx. left lower leg, resulted in Pulmonary emboli on hormone therapy    Medications:  No current facility-administered medications on file prior to encounter.   Current Outpatient Prescriptions on File Prior to Encounter  Medication Sig Dispense Refill  . aspirin 81 MG tablet Take 81 mg by mouth daily.    . cyclobenzaprine (FLEXERIL) 5 MG tablet Take 5 mg by mouth daily as needed for muscle spasms.   1  . diclofenac (VOLTAREN) 75 MG EC tablet Take 75 mg by mouth daily.  3  . eletriptan (RELPAX) 40 MG tablet Take 40 mg by mouth as needed for migraine. One tablet by mouth at onset  of headache. May repeat in 2 hours if headache persists or recurs.    Marland Kitchen escitalopram (LEXAPRO) 20 MG tablet Take 20 mg by mouth every morning.     . fenofibrate 160 MG tablet Take 160 mg by mouth daily. Takes at bedtime    . levothyroxine (SYNTHROID, LEVOTHROID) 150 MCG tablet Take 150 mcg by mouth daily before breakfast.     . morphine (MSIR) 30 MG tablet Take 30 mg by mouth every 12 (twelve) hours.    Marland Kitchen omega-3 acid ethyl esters (LOVAZA) 1 G capsule Take 2 g by mouth 2 (two) times daily.    . ondansetron (ZOFRAN-ODT) 8 MG disintegrating tablet Take 8 mg by mouth every 8 (eight) hours as needed for nausea.    Marland Kitchen rOPINIRole (REQUIP) 0.5 MG tablet Take 0.5 mg by mouth every evening.     . simvastatin (ZOCOR) 40 MG tablet Take 40 mg by mouth every evening.       Assessment: 65 y.o. female admitted s/p Vfib cardiac arrest, for heparin.  Head CT negative for bleed, Chest CT negative for PE. Goal of Therapy:  Heparin level 0.3-0.7 units/ml Monitor platelets by anticoagulation protocol: Yes   Plan:  Heparin 4000 units IV bolus, then start heparin 1100 units/hr Follow-up am labs.  Caryl Pina 11/19/2015,11:19 PM

## 2015-11-19 NOTE — Consult Note (Addendum)
Admit date: 11/19/2015 Referring Physician  Dr. Ashok Cordia Primary Cardiologist  None Reason for Consultation  Cardiac arrest  HPI: This is a 65yo WF with a history of hypothyroidism, dyslipidemia, MVP, PE and DVT 04/2013 on Xarelto until this past February (felt secondary to hormone replacement therapy) who was in her USOH until this evening.  Her friend heard a loud thump and found her on the floor with what appeared like seizure activity.  She tried to clear her airway and then started CPR within a few minutes.  She stopped briefly to call EMS.  CPR was done for 16 minutes total.  On arrival of EMS she was in Vfib arrest.  She was shocked twice by the AED and twice by EMS.  She was given Epi x 2 and had a brief period of PEA.  ROSC was established after 16 minutes of down time.  Her friend states that she had a recent workup in March for chest pain with no ischemia and normal LVF noted on nuclear stress test.  2D echo showed normal LVF.  She says that they were out walking today and she briefly complained of SOB.  In ER she was briefly evaluated by Dr. Tamala Julian because a STEMI was called due to ? Slight ST elevation in I and aVL which did not meet STEMI criteria.  She was intubated in the field.  In ER she has spontaneous but nonpurposeful movements although did respond to pain while defib pad moved for bedside echo.  She does not follow commands and is now sedated. She has been seen by CCM and will not be placed on cooling protocol due to relatively long CPR.  Cardiology is now asked to consult for further evaluation.     PMH:   Past Medical History  Diagnosis Date  . Hypothyroidism   . Hypercholesterolemia   . RLS (restless legs syndrome)   . PONV (postoperative nausea and vomiting)   . Chronic back pain   . MVP (mitral valve prolapse)     asymptomatic  . Dyspnea on exertion   . PE (pulmonary embolism) Apr 18, 2013    tx. -using Xarelto now, no further lung problems, denies SOB on 01-02-14  .  Headache(784.0)     migraines, 1-2 every 2 months  . Arthritis     osteoarthritis-knees. Chronic back pain  . DVT (deep venous thrombosis) ([place])     01-02-14 hx. left lower leg, resulted in Pulmonary emboli on hormone therapy     PSH:   Past Surgical History  Procedure Laterality Date  . Abdominal hysterectomy      TAH/BSO  . Knee surgery Left '76 AND '81    ACL....   . Colonoscopy  09/28/2011    Procedure: COLONOSCOPY;  Surgeon: Juanita Craver, MD;  Location: [facility];  Service: Endoscopy;  Laterality: N/A;  . Total knee arthroplasty Left 01/08/2014    Procedure: LEFT TOTAL KNEE ARTHROPLASTY;  Surgeon: Gearlean Alf, MD;  Location: [facility] ORS;  Service: Orthopedics;  Laterality: Left;    Allergies:  Codeine and Erythromycin Prior to Admit Meds:   (Not in a hospital admission) Fam HX:    Family History  Problem Relation Age of Onset  . Hypertension Mother   . Congestive Heart Failure Mother   . Coronary artery disease Father   . Hypertension Father   . Stroke Brother     DIED AT 49  . Cancer Maternal Aunt     lung, colon  .  Breast cancer Maternal Aunt   . Breast cancer Paternal Aunt    Social HX:    Social History   Social History  . Marital Status: Single    Spouse Name: N/A  . Number of Children: N/A  . Years of Education: N/A   Occupational History  . Not on file.   Social History Main Topics  . Smoking status: Never Smoker   . Smokeless tobacco: Never Used  . Alcohol Use: Yes     Comment: 2-3 times a year, one drink at at time  . Drug Use: No  . Sexual Activity: No     Comment: HYSTERECTOMY   Other Topics Concern  . Not on file   Social History Narrative   Lives with close friend.     ROS:  All 11 ROS were addressed and are negative except what is stated in the HPI  Physical Exam: Blood pressure 94/68, pulse 95, temperature 97.7 F (36.5 C), resp. rate 20, height 5\' 9"  (1.753 m), weight 189 lb (85.73 kg), SpO2 100 %.    General: Well developed,  well nourished,intubated with spontaneous but nonpurposful movments Head: Eyes PERRLA, No xanthomas.   Normal cephalic and atramatic  Lungs:   Scattered rhonchi anteriorly Heart:   HRRR S1 S2 Pulses are 2+ & equal.            No carotid bruit. No JVD.  No abdominal bruits. No femoral bruits. Abdomen: Bowel sounds are positive, abdomen soft and non-tender without masses  Msk:  Back normal, normal gait. Normal strength and tone for age. Extremities:   No clubbing, cyanosis or edema.  DP +1 Neuro: intubated.  Does not respond to commands Psych:  Cannot assess    Labs:   Lab Results  Component Value Date   WBC 11.2* 11/19/2015   HGB 13.6 11/19/2015   HCT 40.0 11/19/2015   MCV 88.5 11/19/2015   PLT 367 11/19/2015    Recent Labs Lab 11/19/15 2035 11/19/15 2041  NA 139 141  K 3.5 3.6  CL 107 104  CO2 18*  --   BUN 19 23*  CREATININE 1.32* 1.20*  CALCIUM 9.1  --   PROT 6.5  --   BILITOT 0.3  --   ALKPHOS 55  --   ALT 96*  --   AST 202*  --   GLUCOSE 229* 223*   No results found for: PTT Lab Results  Component Value Date   INR 1.10 11/19/2015   INR 1.16 01/02/2014   INR 1.03 04/18/2013   Lab Results  Component Value Date   TROPONINI <0.30 04/18/2013     Lab Results  Component Value Date   CHOL 181 11/19/2015   Lab Results  Component Value Date   HDL 66 11/19/2015   Lab Results  Component Value Date   LDLCALC 94 11/19/2015   Lab Results  Component Value Date   TRIG 103 11/19/2015   TRIG 105 11/19/2015   Lab Results  Component Value Date   CHOLHDL 2.7 11/19/2015   No results found for: LDLDIRECT    Radiology:  Dg Chest Port 1 View  11/19/2015  CLINICAL DATA:  Post CPR.  History of mitral valve prolapse. EXAM: PORTABLE CHEST 1 VIEW COMPARISON:  Chest x-ray dated 08/21/2015. FINDINGS: Mild cardiomegaly is stable. Endotracheal tube is well positioned with tip approximately 2.5 cm above the carina. Enteric tube passes below the diaphragm. Central  pulmonary vascular congestion and bilateral interstitial edema, most confluent within the  right upper lung. No pleural effusion or pneumothorax seen. Osseous structures about the chest are unremarkable. IMPRESSION: 1. Mild cardiomegaly with central pulmonary vascular congestion and bilateral interstitial edema indicating mild volume overload and/or sequela of resuscitation efforts. Lungs otherwise clear. No pneumothorax. 2. Endotracheal tube well positioned with tip approximately 2.5 cm above the carina. Electronically Signed   By: Franki Cabot M.D.   On: 11/19/2015 21:09    EKG:  NSR with ST depression in V1-V3 which is new from prior EKGs.  QTC ok.    ASSESSMENT/PLAN:   1.  Resuscitated Vfib cardiac arrest - she had a relatively prolonged down time of at least 16 mintues.  EKG with no acute STEMI but does have new ST depressions in V1-V3.  Bedside echo difficult due to defib pads and EKG leads but parasternal views show no pericardial effusion, normal RV size and normal LVF with no obvious large wall motion abnormality although apical views not obtained.  She has spontaneous movements and responds to pain but not to commands.  She had a normal nuclear stress test several months ago for workup of CP.  She has not had any further complaints of CP but earlier today complained of DOE while walking outside.  Most likely etiology is coronary ischemia but also could be acute PE given her history of DVT and PE in the past and she has only been off anticoagulation for a few months.  Will get stat Chest CT angio to rule out PE.  Scan head.  If no bleed on head CT then start IV Heparin gtt per pharmacy.  Start ASA, high dose statin.  No BB secondary to hypotension.  Start levophed gtt for hypotension.  Titrate to keep MAP > 65.  2D echo in am. Start IV Amio once BP stabilized. Will ultimately need cardiac cath but will need to wait and see if neurologic status recovers.   2.  History of PE and DVT felt secondary to  hormone replacement therapy.  Recently stopped Xarelto.  She took a plane ride to [place] a month ago and came back the same day and also had a 3 hour drive. 3.  Dyslipidemia - change simvastatin to Lipitor 80mg  daily. 4.  Hypothyroidism - check TSH.  The patient is critically ill with multiple organ systems failure and requires high complexity decision making for assessment and support, frequent evaluation and titration of therapies, application of advanced monitoring technologies and extensive interpretation of multiple databases. Critical Care Time devoted to patient care services described in this note independent of APP time is 60 minutes with >50% of time spent in direct patient care.     Fransico Him, MD  11/19/2015  10:14 PM

## 2015-11-19 NOTE — Consult Note (Signed)
Interventional Cardiology  Brittany Mason is 65 years of age and suffered cardiac arrest at home. Other history is vague. CPR was started by family within 3 minutes. A total of 16-20 minutes of CPR was performed before ROSC. Initial rhythm was ventricular fibrillation which went to PEA. First EKG performed after return of rhythm and blood pressure raised a question of subtle but nondiagnostic ST elevation in 1 and aVL. The patient was intubated in the field.  In the emergency room the patient had spontaneous movement. She is intubated. She does not follow commands.  Blood pressure 130/70 heart rate 112 bpm. Pulses are 2+ and symmetric in the upper and lower extremities. Heart sounds does not reveal obvious murmur. Both lungs are ventilated with breath sounds heard. Neurologically, the patient is unable to follow commands but does have spontaneous activity although difficult to tell if it is purposeful.  ECG in the emergency department reveals sinus tachycardia but no evidence of ischemia or injury.  Impression: Possible ischemia related cardiac arrest but with no evidence of ongoing ischemia/infarction on current EKG. Given the relatively long CPR, I believe it would be most prudent to stabilize the patient including correcting metabolic abnormalities. Coronary angiography will need to be performed during the hospital stay and will depend upon the clinical data base.  Plan Serial cardiac markers and EKGs. Anticoagulation. Beta blocker therapy as tolerated by hemodynamics. I would not be surprised if there is elevation in troponin. Formal cardiology consultation. STEMI activation has been canceled.

## 2015-11-19 NOTE — Progress Notes (Signed)
Pt transported to/from CT without event.  RT will continue to monitor.

## 2015-11-19 NOTE — Progress Notes (Signed)
   11/19/15 2053  Clinical Encounter Type  Visited With Patient not available;Health care provider  Visit Type Initial;ED;Critical Care  Referral From Nurse   Chaplain responded to a code STEMI. STEMI has been cancelled. No family is present at this time. Spiritual care services available as needed.   Jeri Lager, Chaplain 11/19/2015 8:54 PM

## 2015-11-19 NOTE — Progress Notes (Signed)
[facility] Progress Note Patient Name: Brittany Mason DOB: 1951/03/08 MRN: [id number]   Date of Service  11/19/2015  HPI/Events of Note  65 yo female with PMH of DM, Hyperlipidemia and DVT/PE (2015). Admitted post VFIB/PEA arrest. Chest CTA - negative for PE. Head/Neck CT Scans - negative. Heparin IV infusion started for NSTEMI by cardiology. BP = 102/64 and HR = 91. Sat = 100% and RR = 18.  eICU Interventions  Continue present management.     Intervention Category Evaluation Type: New Patient Evaluation  Lysle Dingwall 11/19/2015, 11:31 PM

## 2015-11-20 ENCOUNTER — Inpatient Hospital Stay (HOSPITAL_COMMUNITY): Payer: 59

## 2015-11-20 ENCOUNTER — Encounter (HOSPITAL_COMMUNITY)
Admission: EM | Disposition: A | Discharge status: Home Health | Payer: Self-pay | Source: Home / Self Care | Attending: Pulmonary Disease

## 2015-11-20 DIAGNOSIS — R570 Cardiogenic shock: Secondary | ICD-10-CM

## 2015-11-20 DIAGNOSIS — I2510 Atherosclerotic heart disease of native coronary artery without angina pectoris: Secondary | ICD-10-CM

## 2015-11-20 DIAGNOSIS — I469 Cardiac arrest, cause unspecified: Secondary | ICD-10-CM

## 2015-11-20 DIAGNOSIS — I214 Non-ST elevation (NSTEMI) myocardial infarction: Secondary | ICD-10-CM | POA: Diagnosis present

## 2015-11-20 HISTORY — PX: CARDIAC CATHETERIZATION: SHX172

## 2015-11-20 LAB — ECHOCARDIOGRAM COMPLETE
CHL CUP MV DEC (S): 169
E/e' ratio: 14.15
EWDT: 169 ms
FS: 13 % — AB (ref 28–44)
HEIGHTINCHES: 69 in
IV/PV OW: 1.39
LA ID, A-P, ES: 35 mm
LA vol index: 15.3 mL/m2
LA vol: 32.3 mL
LADIAMINDEX: 1.65 cm/m2
LAVOLA4C: 26.6 mL
LDCA: 2.54 cm2
LEFT ATRIUM END SYS DIAM: 35 mm
LV E/e' medial: 14.15
LV PW d: 9.53 mm — AB (ref 0.6–1.1)
LV TDI E'LATERAL: 5.98
LV e' LATERAL: 5.98 cm/s
LV sys vol: 43 mL — AB (ref 14–42)
LVDIAVOL: 75 mL (ref 46–106)
LVDIAVOLIN: 35 mL/m2
LVEEAVG: 14.15
LVOTD: 18 mm
LVSYSVOLIN: 20 mL/m2
MV pk E vel: 84.6 m/s
MVPG: 3 mmHg
MVPKAVEL: 99.4 m/s
Simpson's disk: 43
Stroke v: 32 ml
TAPSE: 17.5 mm
TDI e' medial: 5.55
WEIGHTICAEL: 3181.68 [oz_av]

## 2015-11-20 LAB — CBC WITH DIFFERENTIAL/PLATELET
BASOS PCT: 0 %
Basophils Absolute: 0 10*3/uL (ref 0.0–0.1)
EOS ABS: 0 10*3/uL (ref 0.0–0.7)
Eosinophils Relative: 0 %
HCT: 33.2 % — ABNORMAL LOW (ref 36.0–46.0)
HEMOGLOBIN: 10.6 g/dL — AB (ref 12.0–15.0)
LYMPHS ABS: 2 10*3/uL (ref 0.7–4.0)
Lymphocytes Relative: 21 %
MCH: 28.5 pg (ref 26.0–34.0)
MCHC: 31.6 g/dL (ref 30.0–36.0)
MCV: 90 fL (ref 78.0–100.0)
Monocytes Absolute: 0.6 10*3/uL (ref 0.1–1.0)
Monocytes Relative: 6 %
NEUTROS ABS: 7.1 10*3/uL (ref 1.7–7.7)
NEUTROS PCT: 73 %
PLATELETS: 320 10*3/uL (ref 150–400)
RBC: 3.69 MIL/uL — AB (ref 3.87–5.11)
RDW: 15 % (ref 11.5–15.5)
WBC: 9.7 10*3/uL (ref 4.0–10.5)

## 2015-11-20 LAB — GLUCOSE, CAPILLARY
GLUCOSE-CAPILLARY: 118 mg/dL — AB (ref 65–99)
GLUCOSE-CAPILLARY: 121 mg/dL — AB (ref 65–99)
GLUCOSE-CAPILLARY: 133 mg/dL — AB (ref 65–99)
GLUCOSE-CAPILLARY: 135 mg/dL — AB (ref 65–99)
Glucose-Capillary: 103 mg/dL — ABNORMAL HIGH (ref 65–99)
Glucose-Capillary: 144 mg/dL — ABNORMAL HIGH (ref 65–99)
Glucose-Capillary: 91 mg/dL (ref 65–99)

## 2015-11-20 LAB — RENAL FUNCTION PANEL
ANION GAP: 8 (ref 5–15)
Albumin: 3.1 g/dL — ABNORMAL LOW (ref 3.5–5.0)
BUN: 18 mg/dL (ref 6–20)
CHLORIDE: 110 mmol/L (ref 101–111)
CO2: 20 mmol/L — AB (ref 22–32)
Calcium: 7.5 mg/dL — ABNORMAL LOW (ref 8.9–10.3)
Creatinine, Ser: 0.94 mg/dL (ref 0.44–1.00)
GFR calc non Af Amer: >60 mL/min (ref >60)
GLUCOSE: 131 mg/dL — AB (ref 65–99)
Phosphorus: 2.8 mg/dL (ref 2.5–4.6)
Potassium: 3.9 mmol/L (ref 3.5–5.1)
Sodium: 138 mmol/L (ref 135–145)

## 2015-11-20 LAB — BASIC METABOLIC PANEL
Anion gap: 6 (ref 5–15)
BUN: 11 mg/dL (ref 6–20)
CHLORIDE: 111 mmol/L (ref 101–111)
CO2: 22 mmol/L (ref 22–32)
Calcium: 7.4 mg/dL — ABNORMAL LOW (ref 8.9–10.3)
Creatinine, Ser: 0.74 mg/dL (ref 0.44–1.00)
GFR calc Af Amer: >60 mL/min (ref >60)
GFR calc non Af Amer: >60 mL/min (ref >60)
GLUCOSE: 106 mg/dL — AB (ref 65–99)
POTASSIUM: 4.9 mmol/L (ref 3.5–5.1)
Sodium: 139 mmol/L (ref 135–145)

## 2015-11-20 LAB — POCT I-STAT 3, ART BLOOD GAS (G3+)
ACID-BASE DEFICIT: 9 mmol/L — AB (ref 0.0–2.0)
Acid-base deficit: 6 mmol/L — ABNORMAL HIGH (ref 0.0–2.0)
BICARBONATE: 19.5 meq/L — AB (ref 20.0–24.0)
Bicarbonate: 20.2 mEq/L (ref 20.0–24.0)
O2 SAT: 99 %
O2 SAT: 97 %
PCO2 ART: 46 mmHg — AB (ref 35.0–45.0)
PCO2 ART: 55.1 mmHg — AB (ref 35.0–45.0)
PH ART: 7.256 — AB (ref 7.350–7.450)
PO2 ART: 175 mmHg — AB (ref 80.0–100.0)
PO2 ART: 119 mmHg — AB (ref 80.0–100.0)
Patient temperature: 38
Patient temperature: 37.1
TCO2: 22 mmol/L (ref 0–100)
TCO2: 21 mmol/L (ref 0–100)
pH, Arterial: 7.157 — CL (ref 7.350–7.450)

## 2015-11-20 LAB — TSH: TSH: 5.26 u[IU]/mL — AB (ref 0.350–4.500)

## 2015-11-20 LAB — PROTIME-INR
INR: 1.16 (ref 0.00–1.49)
Prothrombin Time: 15 seconds (ref 11.6–15.2)

## 2015-11-20 LAB — MAGNESIUM
MAGNESIUM: 1.9 mg/dL (ref 1.7–2.4)
MAGNESIUM: 1.9 mg/dL (ref 1.7–2.4)

## 2015-11-20 LAB — LACTIC ACID, PLASMA
Lactic Acid, Venous: 1.2 mmol/L (ref 0.5–2.0)
Lactic Acid, Venous: 1.8 mmol/L (ref 0.5–2.0)
Lactic Acid, Venous: 1.8 mmol/L (ref 0.5–2.0)
Lactic Acid, Venous: 3.6 mmol/L (ref 0.5–2.0)

## 2015-11-20 LAB — TROPONIN I
TROPONIN I: 12.28 ng/mL — AB (ref <0.031)
TROPONIN I: 25.78 ng/mL — AB (ref <0.031)
Troponin I: 29.1 ng/mL (ref <0.031)
Troponin I: 5.24 ng/mL (ref <0.031)

## 2015-11-20 LAB — MRSA PCR SCREENING: MRSA BY PCR: NEGATIVE

## 2015-11-20 LAB — PHOSPHORUS: Phosphorus: 2.9 mg/dL (ref 2.5–4.6)

## 2015-11-20 LAB — TRIGLYCERIDES: TRIGLYCERIDES: 82 mg/dL (ref <150)

## 2015-11-20 LAB — HEPARIN LEVEL (UNFRACTIONATED): HEPARIN UNFRACTIONATED: 0.31 [IU]/mL (ref 0.30–0.70)

## 2015-11-20 LAB — ETHANOL

## 2015-11-20 LAB — T4, FREE: Free T4: 0.99 ng/dL (ref 0.61–1.12)

## 2015-11-20 LAB — PATHOLOGIST SMEAR REVIEW

## 2015-11-20 LAB — BRAIN NATRIURETIC PEPTIDE: B NATRIURETIC PEPTIDE 5: 80 pg/mL (ref 0.0–100.0)

## 2015-11-20 LAB — POCT ACTIVATED CLOTTING TIME: Activated Clotting Time: 136 seconds

## 2015-11-20 SURGERY — LEFT HEART CATH AND CORONARY ANGIOGRAPHY

## 2015-11-20 MED ORDER — SODIUM CHLORIDE 0.9 % IV SOLN
100.0000 ug/h | INTRAVENOUS | Status: DC
  Administered 2015-11-20: 250 ug/h via INTRAVENOUS
  Administered 2015-11-20: 300 ug/h via INTRAVENOUS
  Administered 2015-11-21: 250 ug/h via INTRAVENOUS
  Administered 2015-11-21: 100 ug/h via INTRAVENOUS
  Filled 2015-11-20 (×2): qty 50

## 2015-11-20 MED ORDER — DEXTROSE 5 % IV SOLN
0.0000 ug/min | INTRAVENOUS | Status: DC
  Administered 2015-11-20: 20 ug/min via INTRAVENOUS
  Administered 2015-11-20: 15 ug/min via INTRAVENOUS
  Filled 2015-11-20 (×3): qty 16

## 2015-11-20 MED ORDER — IOPAMIDOL (ISOVUE-370) INJECTION 76%
100.0000 mL | Freq: Once | INTRAVENOUS | Status: AC | PRN
  Administered 2015-11-19: 100 mL via INTRAVENOUS

## 2015-11-20 MED ORDER — VECURONIUM BROMIDE 10 MG IV SOLR
10.0000 mg | Freq: Once | INTRAVENOUS | Status: AC
  Administered 2015-11-20: 10 mg via INTRAVENOUS
  Filled 2015-11-20: qty 10

## 2015-11-20 MED ORDER — PHENYLEPHRINE HCL 10 MG/ML IJ SOLN
0.0000 ug/min | INTRAVENOUS | Status: DC
  Administered 2015-11-20: 100 ug/min via INTRAVENOUS
  Administered 2015-11-20: 74.933 ug/min via INTRAVENOUS
  Filled 2015-11-20 (×3): qty 4

## 2015-11-20 MED ORDER — FENTANYL CITRATE (PF) 100 MCG/2ML IJ SOLN: 100.0000 ug | Freq: Once | INTRAMUSCULAR | Status: DC

## 2015-11-20 MED ORDER — ACETAMINOPHEN 160 MG/5ML PO SOLN
650.0000 mg | ORAL | Status: DC | PRN
  Administered 2015-11-20 (×2): 650 mg
  Filled 2015-11-20 (×2): qty 20.3

## 2015-11-20 MED ORDER — ASPIRIN 81 MG PO CHEW: 81.0000 mg | CHEWABLE_TABLET | ORAL | Status: DC

## 2015-11-20 MED ORDER — LACTATED RINGERS IV BOLUS (SEPSIS)
1000.0000 mL | Freq: Once | INTRAVENOUS | Status: AC
  Administered 2015-11-20: 1000 mL via INTRAVENOUS

## 2015-11-20 MED ORDER — SODIUM CHLORIDE 0.9 % IV SOLN
2000.0000 mL | Freq: Once | INTRAVENOUS | Status: AC
  Administered 2015-11-20: 2000 mL via INTRAVENOUS

## 2015-11-20 MED ORDER — HEPARIN (PORCINE) IN NACL 2-0.9 UNIT/ML-% IJ SOLN
INTRAMUSCULAR | Status: AC
  Filled 2015-11-20: qty 1500

## 2015-11-20 MED ORDER — LIDOCAINE HCL (PF) 1 % IJ SOLN
INTRAMUSCULAR | Status: DC | PRN
  Administered 2015-11-20: 10 mL

## 2015-11-20 MED ORDER — SODIUM CHLORIDE 0.9 % IV SOLN: INTRAVENOUS | Status: DC

## 2015-11-20 MED ORDER — ANTISEPTIC ORAL RINSE SOLUTION (CORINZ)
7.0000 mL | OROMUCOSAL | Status: DC
  Administered 2015-11-20 – 2015-11-22 (×23): 7 mL via OROMUCOSAL

## 2015-11-20 MED ORDER — CHLORHEXIDINE GLUCONATE 0.12% ORAL RINSE (MEDLINE KIT)
15.0000 mL | Freq: Two times a day (BID) | OROMUCOSAL | Status: DC
  Administered 2015-11-20 – 2015-11-22 (×6): 15 mL via OROMUCOSAL

## 2015-11-20 MED ORDER — PHENYLEPHRINE HCL 10 MG/ML IJ SOLN
0.0000 ug/min | INTRAVENOUS | Status: DC
  Administered 2015-11-20 (×2): 100 ug/min via INTRAVENOUS
  Administered 2015-11-20: 20 ug/min via INTRAVENOUS
  Administered 2015-11-20: 100 ug/min via INTRAVENOUS
  Filled 2015-11-20 (×5): qty 1

## 2015-11-20 MED ORDER — NOREPINEPHRINE BITARTRATE 1 MG/ML IV SOLN
0.0000 ug/min | INTRAVENOUS | Status: DC
  Administered 2015-11-20: 5 ug/min via INTRAVENOUS
  Filled 2015-11-20: qty 4

## 2015-11-20 MED ORDER — HEPARIN (PORCINE) IN NACL 2-0.9 UNIT/ML-% IJ SOLN
INTRAMUSCULAR | Status: DC | PRN
Start: 2015-11-20 — End: 2015-11-20
  Administered 2015-11-20: 1500 mL

## 2015-11-20 MED ORDER — VECURONIUM BROMIDE 10 MG IV SOLR
0.8000 ug/kg/min | INTRAVENOUS | Status: DC
  Administered 2015-11-20: 1 ug/kg/min via INTRAVENOUS
  Administered 2015-11-21: 0.8 ug/kg/min via INTRAVENOUS
  Filled 2015-11-20 (×2): qty 100

## 2015-11-20 MED ORDER — ARTIFICIAL TEARS OP OINT
1.0000 "application " | TOPICAL_OINTMENT | Freq: Three times a day (TID) | OPHTHALMIC | Status: DC
  Administered 2015-11-20 – 2015-11-21 (×4): 1 via OPHTHALMIC
  Filled 2015-11-20 (×2): qty 3.5

## 2015-11-20 MED ORDER — IOPAMIDOL (ISOVUE-370) INJECTION 76%
INTRAVENOUS | Status: DC | PRN
  Administered 2015-11-20: 60 mL via INTRAVENOUS

## 2015-11-20 MED ORDER — FENTANYL BOLUS VIA INFUSION
50.0000 ug | INTRAVENOUS | Status: DC | PRN
  Administered 2015-11-21: 50 ug via INTRAVENOUS
  Filled 2015-11-20: qty 50

## 2015-11-20 MED ORDER — FENTANYL CITRATE (PF) 100 MCG/2ML IJ SOLN: 100.0000 ug | Freq: Once | INTRAMUSCULAR | Status: DC | PRN

## 2015-11-20 MED ORDER — SODIUM CHLORIDE 0.9% FLUSH
3.0000 mL | Freq: Two times a day (BID) | INTRAVENOUS | Status: DC
  Administered 2015-11-20: 3 mL via INTRAVENOUS

## 2015-11-20 MED ORDER — SODIUM CHLORIDE 0.9 % IV SOLN: 250.0000 mL | INTRAVENOUS | Status: DC | PRN

## 2015-11-20 MED ORDER — SODIUM CHLORIDE 0.9% FLUSH: 3.0000 mL | INTRAVENOUS | Status: DC | PRN

## 2015-11-20 MED ORDER — LIDOCAINE HCL (PF) 1 % IJ SOLN
INTRAMUSCULAR | Status: AC
  Filled 2015-11-20: qty 30

## 2015-11-20 MED ORDER — FAMOTIDINE IN NACL 20-0.9 MG/50ML-% IV SOLN
20.0000 mg | Freq: Two times a day (BID) | INTRAVENOUS | Status: DC
  Administered 2015-11-20 – 2015-11-23 (×7): 20 mg via INTRAVENOUS
  Filled 2015-11-20 (×7): qty 50

## 2015-11-20 MED ORDER — PROPOFOL 1000 MG/100ML IV EMUL
25.0000 ug/kg/min | INTRAVENOUS | Status: DC
  Administered 2015-11-21 (×3): 30 ug/kg/min via INTRAVENOUS
  Filled 2015-11-20: qty 100
  Filled 2015-11-20: qty 200
  Filled 2015-11-20 (×2): qty 100

## 2015-11-20 MED ORDER — HEPARIN SODIUM (PORCINE) 5000 UNIT/ML IJ SOLN
5000.0000 [IU] | Freq: Three times a day (TID) | INTRAMUSCULAR | Status: DC
  Administered 2015-11-21 – 2015-11-24 (×10): 5000 [IU] via SUBCUTANEOUS
  Filled 2015-11-20 (×10): qty 1

## 2015-11-20 MED ORDER — DEXTROSE 5 % IV SOLN
1.0000 g | INTRAVENOUS | Status: DC
  Administered 2015-11-20 – 2015-11-23 (×4): 1 g via INTRAVENOUS
  Filled 2015-11-20 (×5): qty 10

## 2015-11-20 SURGICAL SUPPLY — 7 items
CATH INFINITI JR4 5F (CATHETERS) ×1 IMPLANT
CATH SITESEER 5F MULTI A 2 (CATHETERS) ×1 IMPLANT
KIT HEART LEFT (KITS) ×2 IMPLANT
PACK CARDIAC CATHETERIZATION (CUSTOM PROCEDURE TRAY) ×2 IMPLANT
SHEATH PINNACLE 6F 10CM (SHEATH) ×1 IMPLANT
TRANSDUCER W/STOPCOCK (MISCELLANEOUS) ×2 IMPLANT
WIRE EMERALD 3MM-J .035X150CM (WIRE) ×1 IMPLANT

## 2015-11-20 NOTE — Progress Notes (Addendum)
Vent changes made by NP following ABG results.

## 2015-11-20 NOTE — Progress Notes (Signed)
PCCM Attending Note:  Contacted by bedside nurse regarding ongoing hypotension. Currently ordered Levophed to maintain mean arterial pressure. Nurse reports patient is withdrawing to pain. She questioned neurological exam. Further clarification patient was sedated with propofol at the time of one neurological exam but was reaching to remove endotracheal tube. Not following commands but again on sedation. Spontaneously moving all 4 extremities. Patient was not having any posturing that I witnessed during the time of my exam. FiO2 now 0.6. Administering lactated Ringer's bolus 1 L now. Switching from Levophed to Neo-Synephrine to minimize potential limb ischemia through peripheral IV. Patient currently on heparin infusion after IV bolus. Attempting to further stabilize the patient given the potential risk of bleeding and complication from central venous catheter insertion on heparin infusion.  Sonia Baller Ashok Cordia, M.D. [facility] Critical [facility] Pager:  [phone] After 3pm or if no response, call [phone] 12:31 AM 11/20/2015

## 2015-11-20 NOTE — Progress Notes (Signed)
   11/20/15 1200  Clinical Encounter Type  Visited With Patient;Other (Comment) (Best Friend)  Visit Type Follow-up  Referral From [place]  Ch visited at pt bedside with best friend (of 40 yrs); pt intubated and on code cool protocol; [place] offered emotional and spiritual support along with prayer; [place] will continue to monitor and is available as needed. Gwynn Burly 12:04 PM

## 2015-11-20 NOTE — Progress Notes (Signed)
PULMONARY / CRITICAL CARE MEDICINE   Name: Brittany Mason MRN: [id number] DOB: 05-18-1951    ADMISSION DATE:  11/19/2015 CONSULTATION DATE:  11/19/2015  REFERRING MD:  Daneen Schick, M.D. / Cardiology  CHIEF COMPLAINT:  Cardiac Arrest  HISTORY OF PRESENT ILLNESS:  Per the electronic medical record patient had a cardiac arrest at home. CPR was performed by family within 3 minutes of the arrest. A total of 16-20 minutes of CPR was performed before return of spontaneous circulation. Initial rhythm was ventricular fibrillation which deteriorated to a pulseless electrical activity. Patient was intubated in the field by EMS. In the emergency department the patient was noted to have spontaneous movement but was not following commands but reportedly difficult to tell if purposeful. Cardiology evaluated the patient and code STEMI was canceled. Family friend confirms she heard the patient fall prior to 8 PM this evening. She reports she was "gasping" intermittently for air. She had no complaints that she had voiced prior to this acute event. Of note the patient did fly to [facility] on May 13 and returned the same day. The patient also had a three-hour drive which they report they did take intermittent stops to get out of the car.   SUBJECTIVE: unable as she is intubated and sedated.   VITAL SIGNS: BP 112/64 mmHg  Pulse 73  Temp(Src) 95.9 F (35.5 C) (Core (Comment))  Resp 0  Ht 5\' 9"  (1.753 m)  Wt 90.2 kg (198 lb 13.7 oz)  BMI 29.35 kg/m2  SpO2 100%  HEMODYNAMICS:    VENTILATOR SETTINGS: Vent Mode:  [-] PRVC FiO2 (%):  [55 %-100 %] 55 % Set Rate:  [18 bmp] 18 bmp Vt Set:  [530 mL] 530 mL PEEP:  [8 cmH20-10 cmH20] 8 cmH20 Plateau Pressure:  [19 cmH20-23 cmH20] 23 cmH20  INTAKE / OUTPUT: I/O last 3 completed shifts: In: 3820.9 [I.V.:3770.9; IV Piggyback:50] Out: [id number] [Urine:1125; Other:250]  PHYSICAL EXAMINATION: General:  Overweight female in NAD  Integument:  Warm & dry. No rash on  exposed skin. No bruising on exposed skin. HEENT:  Moist mucus membranes. No scleral injection or icterus. Endotracheal tube in place. Cardiovascular:  Regular rhythm. Sinus tachycardia on telemetry. No edema.  Pulmonary:  Good aeration & clear to auscultation bilaterally. Abdomen: Soft. hypoactive bowel sounds. Nondistended.  Musculoskeletal:  Normal bulk and tone. No joint deformity or effusion appreciated. Neurological:  Pupils pinpoint symmetric & reactive.  LABS:  BMET  Recent Labs Lab 11/19/15 2035 11/19/15 2041 11/20/15 0425  NA 139 141 138  K 3.5 3.6 3.9  CL 107 104 110  CO2 18*  --  20*  BUN 19 23* 18  CREATININE 1.32* 1.20* 0.94  GLUCOSE 229* 223* 131*    Electrolytes  Recent Labs Lab 11/19/15 2035 11/19/15 2335 11/20/15 0425  CALCIUM 9.1  --  7.5*  MG  --  1.9 1.9  PHOS  --   --  2.9  2.8    CBC  Recent Labs Lab 11/19/15 2035 11/19/15 2041 11/20/15 0425  WBC 11.2*  --  9.7  HGB 11.9* 13.6 10.6*  HCT 37.8 40.0 33.2*  PLT 367  --  320    Coag's  Recent Labs Lab 11/19/15 2035 11/20/15 0425  APTT 24  --   INR 1.10 1.16    Sepsis Markers  Recent Labs Lab 11/19/15 2104 11/19/15 2335 11/20/15 0425  LATICACIDVEN 7.20* 1.8 3.6*    ABG  Recent Labs Lab 11/19/15 2116 11/20/15 0410  PHART 7.257* 7.256*  PCO2ART 45.1* 46.0*  PO2ART 240.0* 175.0*    Liver Enzymes  Recent Labs Lab 11/19/15 2035 11/20/15 0425  AST 202*  --   ALT 96*  --   ALKPHOS 55  --   BILITOT 0.3  --   ALBUMIN 3.8 3.1*    Cardiac Enzymes  Recent Labs Lab 11/19/15 2335 11/20/15 0425  TROPONINI 5.24* 12.28*    Glucose  Recent Labs Lab 11/20/15 0005 11/20/15 0416 11/20/15 0730  GLUCAP 91 133* 144*    Imaging Dg Abd 1 View  11/20/2015  CLINICAL DATA:  Encounter for orogastric tube placement EXAM: ABDOMEN - 1 VIEW COMPARISON:  None. FINDINGS: Orogastric tube projects over the left upper quadrant in the region of the stomach. Primarily  gasless abdomen otherwise. IMPRESSION: Orogastric tube projects over the stomach Electronically Signed   By: Skipper Cliche M.D.   On: 11/20/2015 07:59   Ct Head Wo Contrast  11/19/2015  CLINICAL DATA:  Status post cardiac arrest. Assess for head or cervical spine injury. Initial encounter. EXAM: CT HEAD WITHOUT CONTRAST CT CERVICAL SPINE WITHOUT CONTRAST TECHNIQUE: Multidetector CT imaging of the head and cervical spine was performed following the standard protocol without intravenous contrast. Multiplanar CT image reconstructions of the cervical spine were also generated. COMPARISON:  MRI of the cervical spine performed 05/31/2005 FINDINGS: CT HEAD FINDINGS There is no evidence of acute infarction, mass lesion, or intra- or extra-axial hemorrhage on CT. The posterior fossa, including the cerebellum, brainstem and fourth ventricle, is within normal limits. The third and lateral ventricles, and basal ganglia are unremarkable in appearance. The cerebral hemispheres are symmetric in appearance, with normal gray-white differentiation. No mass effect or midline shift is seen. There is no evidence of fracture; visualized osseous structures are unremarkable in appearance. The orbits are within normal limits. The paranasal sinuses and mastoid air cells are well-aerated. No significant soft tissue abnormalities are seen. CT CERVICAL SPINE FINDINGS There is no evidence of acute fracture or subluxation. There is minimal grade 1 anterolisthesis of C3 on C4, and mild multilevel disc space narrowing along the lower cervical spine, with scattered small anterior and posterior disc osteophyte complexes. Underlying facet disease is noted. The thyroid gland is unremarkable in appearance. Atelectasis or possibly aspiration is noted at the lung apices bilaterally, more prominent on the right. Endotracheal and orogastric tubes are noted. No significant soft tissue abnormalities are seen. IMPRESSION: 1. No evidence of traumatic  intracranial injury or fracture. 2. No evidence of acute fracture or subluxation along the cervical spine. 3. Mild degenerative change along the cervical spine. 4. Atelectasis or possibly aspiration noted at the lung apices bilaterally, more prominent on the right. Electronically Signed   By: Garald Balding M.D.   On: 11/19/2015 23:18   Ct Angio Chest Pe W/cm &/or Wo Cm  11/19/2015  CLINICAL DATA:  Post cardiac arrest. Coded for 16 minutes. History of pulmonary embolus and DVT. EXAM: CT ANGIOGRAPHY CHEST WITH CONTRAST TECHNIQUE: Multidetector CT imaging of the chest was performed using the standard protocol during bolus administration of intravenous contrast. Multiplanar CT image reconstructions and MIPs were obtained to evaluate the vascular anatomy. CONTRAST:  88 mL Isovue 370 COMPARISON:  04/18/2013 FINDINGS: Technically adequate study with good opacification of the central and segmental pulmonary arteries. No discrete focal filling defects are demonstrated. No evidence of significant pulmonary embolus. Normal heart size. Mild coronary artery calcification. No pericardial effusion. Normal caliber thoracic aorta. Endotracheal and enteric tubes are present. Esophagus is decompressed. No significant lymphadenopathy  in the chest. Motion artifact limits evaluation of the lungs. There is dense consolidation in both lungs posteriorly with patchy airspace disease throughout the remainder of the lungs. Changes could be due to aspiration or multifocal pneumonia. No pneumothorax. No pleural effusions. Included portions of the upper abdominal organs are grossly unremarkable. The sternum and visualized ribs appear intact without acute displaced fracture identified. Review of the MIP images confirms the above findings. IMPRESSION: No evidence of significant pulmonary embolus. Diffuse airspace disease and consolidation in both lungs compatible with multifocal pneumonia or aspiration. Electronically Signed   By: Lucienne Capers M.D.   On: 11/19/2015 22:53   Ct Cervical Spine Wo Contrast  11/19/2015  CLINICAL DATA:  Status post cardiac arrest. Assess for head or cervical spine injury. Initial encounter. EXAM: CT HEAD WITHOUT CONTRAST CT CERVICAL SPINE WITHOUT CONTRAST TECHNIQUE: Multidetector CT imaging of the head and cervical spine was performed following the standard protocol without intravenous contrast. Multiplanar CT image reconstructions of the cervical spine were also generated. COMPARISON:  MRI of the cervical spine performed 05/31/2005 FINDINGS: CT HEAD FINDINGS There is no evidence of acute infarction, mass lesion, or intra- or extra-axial hemorrhage on CT. The posterior fossa, including the cerebellum, brainstem and fourth ventricle, is within normal limits. The third and lateral ventricles, and basal ganglia are unremarkable in appearance. The cerebral hemispheres are symmetric in appearance, with normal gray-white differentiation. No mass effect or midline shift is seen. There is no evidence of fracture; visualized osseous structures are unremarkable in appearance. The orbits are within normal limits. The paranasal sinuses and mastoid air cells are well-aerated. No significant soft tissue abnormalities are seen. CT CERVICAL SPINE FINDINGS There is no evidence of acute fracture or subluxation. There is minimal grade 1 anterolisthesis of C3 on C4, and mild multilevel disc space narrowing along the lower cervical spine, with scattered small anterior and posterior disc osteophyte complexes. Underlying facet disease is noted. The thyroid gland is unremarkable in appearance. Atelectasis or possibly aspiration is noted at the lung apices bilaterally, more prominent on the right. Endotracheal and orogastric tubes are noted. No significant soft tissue abnormalities are seen. IMPRESSION: 1. No evidence of traumatic intracranial injury or fracture. 2. No evidence of acute fracture or subluxation along the cervical spine. 3.  Mild degenerative change along the cervical spine. 4. Atelectasis or possibly aspiration noted at the lung apices bilaterally, more prominent on the right. Electronically Signed   By: Garald Balding M.D.   On: 11/19/2015 23:18   Dg Chest Port 1 View  11/19/2015  CLINICAL DATA:  Post CPR.  History of mitral valve prolapse. EXAM: PORTABLE CHEST 1 VIEW COMPARISON:  Chest x-ray dated 08/21/2015. FINDINGS: Mild cardiomegaly is stable. Endotracheal tube is well positioned with tip approximately 2.5 cm above the carina. Enteric tube passes below the diaphragm. Central pulmonary vascular congestion and bilateral interstitial edema, most confluent within the right upper lung. No pleural effusion or pneumothorax seen. Osseous structures about the chest are unremarkable. IMPRESSION: 1. Mild cardiomegaly with central pulmonary vascular congestion and bilateral interstitial edema indicating mild volume overload and/or sequela of resuscitation efforts. Lungs otherwise clear. No pneumothorax. 2. Endotracheal tube well positioned with tip approximately 2.5 cm above the carina. Electronically Signed   By: Franki Cabot M.D.   On: 11/19/2015 21:09     STUDIES:  PORT CXR 6/13:   endotracheal tube in good position. Enteric tube below the diaphragm. Pulmonary vascular congestion with bilateral interstitial prominence. No pleural effusion appreciated. No  focal opacity.  CT Head/Neck 6/13> No evidence of traumatic intracranial injury or fracture. No evidence of acute fracture or subluxation along the cervical spine. Mild degenerative change along the cervical spine. CTA CHEST 6/13> No evidence of significant pulmonary embolus. Diffuse airspace disease and consolidation in both lungs compatible with multifocal pneumonia or aspiration.  MICROBIOLOGY: MRSA PCR 6/13 >>  ANTIBIOTICS: Rocephin 6/14 >  SIGNIFICANT EVENTS: 6/13 - Admit w/ out of hospital arrest  LINES/TUBES: OETT 7.5 6/13 >>  OGT 6/13 >> Foley 6/13 >> PIV  x2  DISCUSSION:  65 year old female with out of hospital arrest. History of diabetes mellitus and hyperlipidemia. 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 Recently taken off of Xarelto this year. Patient is having some purposeful movements therefore deferring cooling at this time. Awaiting further results from workup with CT of the head/neck and CT angiogram of the chest.  ASSESSMENT / PLAN:  PULMONARY A: Acute Hypoxic Respiratory Failure - Post arrest w/ pulmonary edema on port cxr. 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 Occurred while on hormone therapy. PE ruled out on CTA chest 6/13  P:   Full Vent Support SBT & WUA as mental status allows  CARDIOVASCULAR A:  Cardiogenic shock S/P V Fib/PEA Arrest - At least 16 minutes before ROSC. H/O MVP H/O Hyperlipidemia  P:  Cardiology Following Monitor on telemetry Heparin drip per pharmacy protocol Trending Troponin I q6hr > rising, will follow for cardiology recommendations.  Echo pending MAP goal > 37mmHg Phenylephrine and norepinephrine for MAP goal Will place CVL CVP monitoring Holding home Fenofibrate & Zocor  RENAL A:   Lactic Acidosis  P:   Monitoring UOP with Foley Trending electrolytes & renal function daily Replacing electrolytes as indicated Stat Magnesium  GASTROINTESTINAL A:   No acute issues.  P:   NPO Pepcid IV daily  HEMATOLOGIC A:   Leukocytosis - Mild. Likely reactive. Anemia - Mild.  P:  Trending cell counts daily w/ CBC Heparin drip for therapeutic anticoagulation per pharmacy protocol  INFECTIOUS A:   Possible aspiration  P:   Monitor fever & leukocytosis Rocephin for aspiration  ENDOCRINE A:   Hyperglycemia H/O Hypothyroidism (TSH elevated but T4 ok)    P:   Checking Hgb A1c Accu-Checks q4hr SSI per Low Dose Algorithm Holding Synthroid  NEUROLOGIC A:   Sedation on Ventilator. H/O Restless Leg Syndrome H/O  Headaches  P:   RASS goal: 0 to -1 Fentanyl gtt Propofol gtt Holding Lexapro, Flexeril, Relpax, Requip, & MSIR.   FAMILY  - Updates: Sister and roommate/friend updated at bedside 6/13 by Dr. Ashok Cordia.  - Inter-disciplinary family meet or Palliative Care meeting due by:  6/20  Georgann Housekeeper, AGACNP-BC [place] Pulmonology/Critical Care Pager [phone] or [phone]  11/20/2015 11:11 AM  Normothermia.  Remains on vent >> dyssynchrony when sedation decreased.  B/l crackles, HR regular, abd soft.  Troponin 12.28, Hb 10.6, Creatinine 0.94  EEG - diffuse slowing  Echo - EF 35 to 40%  Assessment/plan:  Acute resp failure, hypoxia Aspiration pneumonia. - full vent support >> changed to pressure control - f/u CXR, ABG - continue Abx  VF arrest. - cardiac assessment if neuro status improved  Acute anoxic encephalopathy. - monitor mental status  Updated pt's family friend at bedside.   CC time by me independent of APP time 32 minutes.  Chesley Mires, MD [facility] Pulmonary/Critical Care 11/20/2015, 12:44 PM Pager:  [phone] After 3pm call: [phone]

## 2015-11-20 NOTE — Progress Notes (Signed)
Attempt to start vecuronium futile, unable to get 4/4 twitches to obtain baseline mA. As pt had received a 10mg  bolus previously per order. Pharm notified re half life. Propofol turned off for [id number] to ellicit the necessary response but cath lab called for pt. propofol turned back on. Endorsed to PM RN to start Veuronium gtt.

## 2015-11-20 NOTE — Progress Notes (Signed)
[place] for Heparin Indication: chest pain/ACS  Allergies  Allergen Reactions  . Codeine Nausea And Vomiting    Takes hydrocodone; if takes doses too close together, states "violently throws up"  . Erythromycin Hives and Itching    Patient Measurements: Height: 5\' 9"  (175.3 cm) Weight: 198 lb 13.7 oz (90.2 kg) IBW/kg (Calculated) : 66.2  Vital Signs: Temp: 96.6 F (35.9 C) (06/14 1134) Temp Source: Core (Comment) (06/14 1134) BP: 126/77 mmHg (06/14 1300) Pulse Rate: 99 (06/14 1300)  Labs:  Recent Labs  11/19/15 2035 11/19/15 2041 11/19/15 2335 11/20/15 0425 11/20/15 1300  HGB 11.9* 13.6  --  10.6*  --   HCT 37.8 40.0  --  33.2*  --   PLT 367  --   --  320  --   APTT 24  --   --   --   --   LABPROT 14.4  --   --  15.0  --   INR 1.10  --   --  1.16  --   HEPARINUNFRC  --   --   --   --  0.31  CREATININE 1.32* 1.20*  --  0.94  --   TROPONINI  --   --  5.24* 12.28* 25.78*    Estimated Creatinine Clearance: 74.3 mL/min (by C-G formula based on Cr of 0.94).   Medical History: Past Medical History  Diagnosis Date  . Hypothyroidism   . Hypercholesterolemia   . RLS (restless legs syndrome)   . PONV (postoperative nausea and vomiting)   . Chronic back pain   . MVP (mitral valve prolapse)     asymptomatic  . Dyspnea on exertion   . PE (pulmonary embolism) Apr 18, 2013    tx. -using Xarelto now, no further lung problems, denies SOB on 01-02-14  . Headache(784.0)     migraines, 1-2 every 2 months  . Arthritis     osteoarthritis-knees. Chronic back pain  . DVT (deep venous thrombosis) ([place])     01-02-14 hx. left lower leg, resulted in Pulmonary emboli on hormone therapy    Medications:  No current facility-administered medications on file prior to encounter.   Current Outpatient Prescriptions on File Prior to Encounter  Medication Sig Dispense Refill  . aspirin 81 MG tablet Take 81 mg by mouth daily.    . cyclobenzaprine  (FLEXERIL) 5 MG tablet Take 5 mg by mouth 3 (three) times daily.   1  . diclofenac (VOLTAREN) 75 MG EC tablet Take 75 mg by mouth daily.  3  . eletriptan (RELPAX) 40 MG tablet Take 40 mg by mouth as needed for migraine. One tablet by mouth at onset of headache. May repeat in 2 hours if headache persists or recurs.    Marland Kitchen escitalopram (LEXAPRO) 20 MG tablet Take 20 mg by mouth every morning.     . fenofibrate 160 MG tablet Take 160 mg by mouth daily. Takes at bedtime    . levothyroxine (SYNTHROID, LEVOTHROID) 150 MCG tablet Take 150 mcg by mouth daily before breakfast.     . morphine (MSIR) 30 MG tablet Take 30 mg by mouth every 12 (twelve) hours.    Marland Kitchen omega-3 acid ethyl esters (LOVAZA) 1 G capsule Take 2 g by mouth 2 (two) times daily.    Marland Kitchen rOPINIRole (REQUIP) 0.5 MG tablet Take 0.5 mg by mouth every evening.     . simvastatin (ZOCOR) 40 MG tablet Take 40 mg by mouth every evening.  Assessment: 65 y.o. female admitted s/p Vfib cardiac arrest, for heparin. Pt has a history of VTE and was previously on Xarelto however she reportedly discontinued this awhile ago.  Head CT negative for bleed, Chest CT negative for PE. Hypothermia protocol. Heparin level on low end of therapeutic, will increase a little. CBC stable.   Goal of Therapy:  Heparin level 0.3-0.7 units/ml Monitor platelets by anticoagulation protocol: Yes   Plan:  -Increase heparin gtt to 1200 units/hr -Daily HL, CBC -Check level this evening   Hughes Better, PharmD, BCPS Clinical Pharmacist 11/20/2015 2:20 PM

## 2015-11-20 NOTE — Progress Notes (Signed)
  Echocardiogram 2D Echocardiogram has been performed.  Darlina Sicilian M 11/20/2015, 10:57 AM

## 2015-11-20 NOTE — Progress Notes (Signed)
[facility] PCCM PROGRESS NOTE  S: Brittany Mason has been having issues with ventilator synchrony throughout the day causing poor ventilation ABG confirming hypercarbia. We have tried ventilator changes, however, she has not responded to this. Deep sedation has been achieved, however she remains dyssynchronous. Suspect aspiration is worse than we thought. Have given her 10mg  of vecuronium and asynchrony is resolved.   O: BP 131/89 mmHg  Pulse 70  Temp(Src) 96.8 F (36 C) (Core (Comment))  Resp 24  Ht 5\' 9"  (1.753 m)  Wt 90.2 kg (198 lb 13.7 oz)  BMI 29.35 kg/m2  SpO2 100%  General:  Overweight female in NAD on vent Neuro:  Sedated HEENT:  [place]/AT, PERRL, no JVD Cardiovascular:  RRR Lungs:  Coarse Abdomen:  Soft, non-tender, non-distended Musculoskeletal:  No acute deformity Skin:  Intact, MMM  A/P: Acute hypoxemic/hypercarbic respiratory failure Aspiration PNA S/p cardiac arrest - Start vecuronium infusion per neuromuscular blockade protocol.  - BIS monitoring with goal < 60 - TO4 monitoring   New onset fevers - on cooling pads to maintain normothermia - PRN tylenol ordered  Additional critical care time 30 mins  Georgann Housekeeper, ACNP [facility] Pulmonology/Critical Care Pager [phone] or [phone]

## 2015-11-20 NOTE — H&P (View-Only) (Signed)
Subjective:  Intubated/ Sedated and cooled s/p VF arrest/ resuscitated with ROSC 16 min   Objective:  Temp:  [91 F (32.8 C)-100.4 F (38 C)] 96.6 F (35.9 C) (06/14 1134) Pulse Rate:  [63-133] 79 (06/14 1134) Resp:  [0-23] 12 (06/14 1134) BP: (62-140)/(46-101) 91/69 mmHg (06/14 1134) SpO2:  [93 %-100 %] 97 % (06/14 1134) FiO2 (%):  [50 %-100 %] 50 % (06/14 1134) Weight:  [189 lb (85.73 kg)-198 lb 13.7 oz (90.2 kg)] 198 lb 13.7 oz (90.2 kg) (06/14 0000) Weight change:   Intake/Output from previous day: 06/13 0701 - 06/14 0700 In: 3820.9 [I.V.:3770.9; IV Piggyback:50] Out: 7829 [Urine:1125]  Intake/Output from this shift: Total I/O In: 1509.6 [I.V.:1509.6] Out: 350 [Urine:350]  Physical Exam: General appearance: Intubated and sedated Neck: no adenopathy, no carotid bruit, no JVD, supple, symmetrical, trachea midline and thyroid not enlarged, symmetric, no tenderness/mass/nodules Lungs: clear to auscultation bilaterally Heart: regular rate and rhythm, S1, S2 normal, no murmur, click, rub or gallop Extremities: extremities normal, atraumatic, no cyanosis or edema  Lab Results: Results for orders placed or performed during the hospital encounter of 11/19/15 (from the past 48 hour(s))  Triglycerides     Status: None   Collection Time: 11/19/15  8:35 PM  Result Value Ref Range   Triglycerides 105 <150 mg/dL  CBC     Status: Abnormal   Collection Time: 11/19/15  8:35 PM  Result Value Ref Range   WBC 11.2 (H) 4.0 - 10.5 K/uL   RBC 4.27 3.87 - 5.11 MIL/uL   Hemoglobin 11.9 (L) 12.0 - 15.0 g/dL   HCT 37.8 36.0 - 46.0 %   MCV 88.5 78.0 - 100.0 fL   MCH 27.9 26.0 - 34.0 pg   MCHC 31.5 30.0 - 36.0 g/dL   RDW 14.6 11.5 - 15.5 %   Platelets 367 150 - 400 K/uL  Differential     Status: Abnormal   Collection Time: 11/19/15  8:35 PM  Result Value Ref Range   Neutrophils Relative % 33 %   Lymphocytes Relative 61 %   Monocytes Relative 5 %   Eosinophils Relative 1 %   Basophils Relative 0 %   Neutro Abs 3.7 1.7 - 7.7 K/uL   Lymphs Abs 6.8 (H) 0.7 - 4.0 K/uL   Monocytes Absolute 0.6 0.1 - 1.0 K/uL   Eosinophils Absolute 0.1 0.0 - 0.7 K/uL   Basophils Absolute 0.0 0.0 - 0.1 K/uL   WBC Morphology ABSOLUTE LYMPHOCYTOSIS   Protime-INR     Status: None   Collection Time: 11/19/15  8:35 PM  Result Value Ref Range   Prothrombin Time 14.4 11.6 - 15.2 seconds   INR 1.10 0.00 - 1.49  APTT     Status: None   Collection Time: 11/19/15  8:35 PM  Result Value Ref Range   aPTT 24 24 - 37 seconds  Comprehensive metabolic panel     Status: Abnormal   Collection Time: 11/19/15  8:35 PM  Result Value Ref Range   Sodium 139 135 - 145 mmol/L   Potassium 3.5 3.5 - 5.1 mmol/L   Chloride 107 101 - 111 mmol/L   CO2 18 (L) 22 - 32 mmol/L   Glucose, Bld 229 (H) 65 - 99 mg/dL   BUN 19 6 - 20 mg/dL   Creatinine, Ser 1.32 (H) 0.44 - 1.00 mg/dL   Calcium 9.1 8.9 - 10.3 mg/dL   Total Protein 6.5 6.5 - 8.1 g/dL   Albumin 3.8 3.5 -  5.0 g/dL   AST 202 (H) 15 - 41 U/L   ALT 96 (H) 14 - 54 U/L   Alkaline Phosphatase 55 38 - 126 U/L   Total Bilirubin 0.3 0.3 - 1.2 mg/dL   GFR calc non Af Amer 42 (L) >60 mL/min   GFR calc Af Amer 49 (L) >60 mL/min    Comment: (NOTE) The eGFR has been calculated using the CKD EPI equation. This calculation has not been validated in all clinical situations. eGFR's persistently <60 mL/min signify possible Chronic Kidney Disease.    Anion gap 14 5 - 15  Lipid panel     Status: None   Collection Time: 11/19/15  8:36 PM  Result Value Ref Range   Cholesterol 181 0 - 200 mg/dL   Triglycerides 103 <150 mg/dL   HDL 66 >40 mg/dL   Total CHOL/HDL Ratio 2.7 RATIO   VLDL 21 0 - 40 mg/dL   LDL Cholesterol 94 0 - 99 mg/dL    Comment:        Total Cholesterol/HDL:CHD Risk Coronary Heart Disease Risk Table                     Men   Women  1/2 Average Risk   3.4   3.3  Average Risk       5.0   4.4  2 X Average Risk   9.6   7.1  3 X Average Risk   23.4   11.0        Use the calculated Patient Ratio above and the CHD Risk Table to determine the patient's CHD Risk.        ATP III CLASSIFICATION (LDL):  <100     mg/dL   Optimal  100-129  mg/dL   Near or Above                    Optimal  130-159  mg/dL   Borderline  160-189  mg/dL   High  >190     mg/dL   Very High   I-stat troponin, ED     Status: Abnormal   Collection Time: 11/19/15  8:39 PM  Result Value Ref Range   Troponin i, poc 0.37 (HH) 0.00 - 0.08 ng/mL   Comment NOTIFIED PHYSICIAN    Comment 3            Comment: Due to the release kinetics of cTnI, a negative result within the first hours of the onset of symptoms does not rule out myocardial infarction with certainty. If myocardial infarction is still suspected, repeat the test at appropriate intervals.   I-Stat Chem 8, ED     Status: Abnormal   Collection Time: 11/19/15  8:41 PM  Result Value Ref Range   Sodium 141 135 - 145 mmol/L   Potassium 3.6 3.5 - 5.1 mmol/L   Chloride 104 101 - 111 mmol/L   BUN 23 (H) 6 - 20 mg/dL   Creatinine, Ser 1.20 (H) 0.44 - 1.00 mg/dL   Glucose, Bld 223 (H) 65 - 99 mg/dL   Calcium, Ion 1.11 (L) 1.13 - 1.30 mmol/L   TCO2 20 0 - 100 mmol/L   Hemoglobin 13.6 12.0 - 15.0 g/dL   HCT 40.0 36.0 - 46.0 %  I-Stat CG4 Lactic Acid, ED  (not at [facility])     Status: Abnormal   Collection Time: 11/19/15  9:04 PM  Result Value Ref Range   Lactic Acid, Venous 7.20 (HH) 0.5 -  2.0 mmol/L   Comment NOTIFIED PHYSICIAN   Type and screen     Status: None   Collection Time: 11/19/15  9:15 PM  Result Value Ref Range   ABO/RH(D) A POS    Antibody Screen NEG    Sample Expiration 11/22/2015   ABO/Rh     Status: None   Collection Time: 11/19/15  9:15 PM  Result Value Ref Range   ABO/RH(D) A POS   I-Stat arterial blood gas, ED ([facility], [facility])     Status: Abnormal   Collection Time: 11/19/15  9:16 PM  Result Value Ref Range   pH, Arterial 7.257 (L) 7.350 - 7.450   pCO2 arterial 45.1 (H) 35.0 - 45.0  mmHg   pO2, Arterial 240.0 (H) 80.0 - 100.0 mmHg   Bicarbonate 20.1 20.0 - 24.0 mEq/L   TCO2 21 0 - 100 mmol/L   O2 Saturation 100.0 %   Acid-base deficit 7.0 (H) 0.0 - 2.0 mmol/L   Patient temperature 98.6 F    Collection site RADIAL, ALLEN'S TEST ACCEPTABLE    Drawn by RT    Sample type ARTERIAL   Rapid urine drug screen (hospital performed)     Status: Abnormal   Collection Time: 11/19/15  9:26 PM  Result Value Ref Range   Opiates POSITIVE (A) NONE DETECTED   Cocaine NONE DETECTED NONE DETECTED   Benzodiazepines POSITIVE (A) NONE DETECTED   Amphetamines NONE DETECTED NONE DETECTED   Tetrahydrocannabinol NONE DETECTED NONE DETECTED   Barbiturates NONE DETECTED NONE DETECTED    Comment:        DRUG SCREEN FOR MEDICAL PURPOSES ONLY.  IF CONFIRMATION IS NEEDED FOR ANY PURPOSE, NOTIFY LAB WITHIN 5 DAYS.        LOWEST DETECTABLE LIMITS FOR URINE DRUG SCREEN Drug Class       Cutoff (ng/mL) Amphetamine      1000 Barbiturate      200 Benzodiazepine   127 Tricyclics       517 Opiates          300 Cocaine          300 THC              50   Urinalysis, Routine w reflex microscopic (not at [facility])     Status: Abnormal   Collection Time: 11/19/15  9:26 PM  Result Value Ref Range   Color, Urine STRAW (A) YELLOW   APPearance CLEAR CLEAR   Specific Gravity, Urine 1.014 1.005 - 1.030   pH 6.5 5.0 - 8.0   Glucose, UA 250 (A) NEGATIVE mg/dL   Hgb urine dipstick MODERATE (A) NEGATIVE   Bilirubin Urine NEGATIVE NEGATIVE   Ketones, ur NEGATIVE NEGATIVE mg/dL   Protein, ur 100 (A) NEGATIVE mg/dL   Nitrite NEGATIVE NEGATIVE   Leukocytes, UA NEGATIVE NEGATIVE  Urine microscopic-add on     Status: Abnormal   Collection Time: 11/19/15  9:26 PM  Result Value Ref Range   Squamous Epithelial / LPF 0-5 (A) NONE SEEN   WBC, UA 0-5 0 - 5 WBC/hpf   RBC / HPF 6-30 0 - 5 RBC/hpf   Bacteria, UA FEW (A) NONE SEEN   Casts HYALINE CASTS (A) NEGATIVE  MRSA PCR Screening     Status: None    Collection Time: 11/19/15 11:28 PM  Result Value Ref Range   MRSA by PCR NEGATIVE NEGATIVE    Comment:        The GeneXpert MRSA Assay (FDA approved for NASAL specimens  only), is one component of a comprehensive MRSA colonization surveillance program. It is not intended to diagnose MRSA infection nor to guide or monitor treatment for MRSA infections.   Ethanol     Status: None   Collection Time: 11/19/15 11:35 PM  Result Value Ref Range   Alcohol, Ethyl (B) <5 <5 mg/dL    Comment:        LOWEST DETECTABLE LIMIT FOR SERUM ALCOHOL IS 5 mg/dL FOR MEDICAL PURPOSES ONLY   Brain natriuretic peptide     Status: None   Collection Time: 11/19/15 11:35 PM  Result Value Ref Range   B Natriuretic Peptide 80.0 0.0 - 100.0 pg/mL  Troponin I (q 6hr x 3)     Status: Abnormal   Collection Time: 11/19/15 11:35 PM  Result Value Ref Range   Troponin I 5.24 (HH) <0.031 ng/mL    Comment:        POSSIBLE MYOCARDIAL ISCHEMIA. SERIAL TESTING RECOMMENDED. CRITICAL RESULT CALLED TO, READ BACK BY AND VERIFIED WITH: P DAVIS,RN [id number] WILDERK   Lactic acid, plasma     Status: None   Collection Time: 11/19/15 11:35 PM  Result Value Ref Range   Lactic Acid, Venous 1.8 0.5 - 2.0 mmol/L  TSH     Status: Abnormal   Collection Time: 11/19/15 11:35 PM  Result Value Ref Range   TSH 5.260 (H) 0.350 - 4.500 uIU/mL  T4, free     Status: None   Collection Time: 11/19/15 11:35 PM  Result Value Ref Range   Free T4 0.99 0.61 - 1.12 ng/dL    Comment: (NOTE) Biotin ingestion may interfere with free T4 tests. If the results are inconsistent with the TSH level, previous test results, or the clinical presentation, then consider biotin interference. If needed, order repeat testing after stopping biotin.   Magnesium     Status: None   Collection Time: 11/19/15 11:35 PM  Result Value Ref Range   Magnesium 1.9 1.7 - 2.4 mg/dL  Glucose, capillary     Status: None   Collection Time: 11/20/15 12:05 AM    Result Value Ref Range   Glucose-Capillary 91 65 - 99 mg/dL  I-STAT 3, arterial blood gas (G3+)     Status: Abnormal   Collection Time: 11/20/15  4:10 AM  Result Value Ref Range   pH, Arterial 7.256 (L) 7.350 - 7.450   pCO2 arterial 46.0 (H) 35.0 - 45.0 mmHg   pO2, Arterial 175.0 (H) 80.0 - 100.0 mmHg   Bicarbonate 20.2 20.0 - 24.0 mEq/L   TCO2 22 0 - 100 mmol/L   O2 Saturation 99.0 %   Acid-base deficit 6.0 (H) 0.0 - 2.0 mmol/L   Patient temperature 38.0 C    Collection site RADIAL, ALLEN'S TEST ACCEPTABLE    Drawn by RT    Sample type ARTERIAL   Glucose, capillary     Status: Abnormal   Collection Time: 11/20/15  4:16 AM  Result Value Ref Range   Glucose-Capillary 133 (H) 65 - 99 mg/dL  Renal function panel     Status: Abnormal   Collection Time: 11/20/15  4:25 AM  Result Value Ref Range   Sodium 138 135 - 145 mmol/L   Potassium 3.9 3.5 - 5.1 mmol/L   Chloride 110 101 - 111 mmol/L   CO2 20 (L) 22 - 32 mmol/L   Glucose, Bld 131 (H) 65 - 99 mg/dL   BUN 18 6 - 20 mg/dL   Creatinine, Ser 0.94 0.44 - 1.00  mg/dL   Calcium 7.5 (L) 8.9 - 10.3 mg/dL   Phosphorus 2.8 2.5 - 4.6 mg/dL   Albumin 3.1 (L) 3.5 - 5.0 g/dL   GFR calc non Af Amer >60 >60 mL/min   GFR calc Af Amer >60 >60 mL/min    Comment: (NOTE) The eGFR has been calculated using the CKD EPI equation. This calculation has not been validated in all clinical situations. eGFR's persistently <60 mL/min signify possible Chronic Kidney Disease.    Anion gap 8 5 - 15  CBC with Differential/Platelet     Status: Abnormal   Collection Time: 11/20/15  4:25 AM  Result Value Ref Range   WBC 9.7 4.0 - 10.5 K/uL   RBC 3.69 (L) 3.87 - 5.11 MIL/uL   Hemoglobin 10.6 (L) 12.0 - 15.0 g/dL    Comment: DELTA CHECK NOTED REPEATED TO VERIFY    HCT 33.2 (L) 36.0 - 46.0 %   MCV 90.0 78.0 - 100.0 fL   MCH 28.5 26.0 - 34.0 pg   MCHC 31.6 30.0 - 36.0 g/dL   RDW 15.0 11.5 - 15.5 %   Platelets 320 150 - 400 K/uL   Neutrophils Relative %  73 %   Neutro Abs 7.1 1.7 - 7.7 K/uL   Lymphocytes Relative 21 %   Lymphs Abs 2.0 0.7 - 4.0 K/uL   Monocytes Relative 6 %   Monocytes Absolute 0.6 0.1 - 1.0 K/uL   Eosinophils Relative 0 %   Eosinophils Absolute 0.0 0.0 - 0.7 K/uL   Basophils Relative 0 %   Basophils Absolute 0.0 0.0 - 0.1 K/uL  Magnesium     Status: None   Collection Time: 11/20/15  4:25 AM  Result Value Ref Range   Magnesium 1.9 1.7 - 2.4 mg/dL  Triglycerides     Status: None   Collection Time: 11/20/15  4:25 AM  Result Value Ref Range   Triglycerides 82 <150 mg/dL  Troponin I (q 6hr x 3)     Status: Abnormal   Collection Time: 11/20/15  4:25 AM  Result Value Ref Range   Troponin I 12.28 (HH) <0.031 ng/mL    Comment:        POSSIBLE MYOCARDIAL ISCHEMIA. SERIAL TESTING RECOMMENDED. CRITICAL VALUE NOTED.  VALUE IS CONSISTENT WITH PREVIOUSLY REPORTED AND CALLED VALUE.   Lactic acid, plasma     Status: Abnormal   Collection Time: 11/20/15  4:25 AM  Result Value Ref Range   Lactic Acid, Venous 3.6 (HH) 0.5 - 2.0 mmol/L    Comment: CRITICAL RESULT CALLED TO, READ BACK BY AND VERIFIED WITH: P DAVIS,RN [id number] WILDERK   Protime-INR     Status: None   Collection Time: 11/20/15  4:25 AM  Result Value Ref Range   Prothrombin Time 15.0 11.6 - 15.2 seconds   INR 1.16 0.00 - 1.49  Phosphorus     Status: None   Collection Time: 11/20/15  4:25 AM  Result Value Ref Range   Phosphorus 2.9 2.5 - 4.6 mg/dL  Glucose, capillary     Status: Abnormal   Collection Time: 11/20/15  7:30 AM  Result Value Ref Range   Glucose-Capillary 144 (H) 65 - 99 mg/dL   Comment 1 Capillary Specimen     Imaging: Imaging results have been reviewed I have personally reviewed  Tele- NSR  Assessment/Plan:   1. Active Problems: 2.   Cardiac arrest ([place]) 3.   Time Spent Directly with Patient:  20 minutes  Length of Stay:  LOS:  1 day    Day #1 VF arrest, CPR, resuscitated, ROSC 16 min. VDRF intubated and sedated , artic  sun. EKG w/o acute changes. Trop + 12---> cycle. BP soft on Neo (max dose) and Levophed, 2D pending. Worry about CGS. If 2D nl (no focal WMA) will wait until she rewarms and her neuro status declares itself. If she has a Focal WMA may need cor angio to define her anatomy in light of BP issues.  I have reviewed the 2D echo which shows EF 40% with inferolateral WMA. In light of this as well as trop 12 and low BP on 2 pressors I think it's reasonable to do cor angio today to define anatomy. I have discussed with Dr Tamala Julian and have gotten verbal consent over the phone from her Sister who is POA.   Quay Burow 11/20/2015, 12:23 PM

## 2015-11-20 NOTE — Progress Notes (Signed)
EEG completed, results pending. 

## 2015-11-20 NOTE — Progress Notes (Addendum)
Subjective:  Intubated/ Sedated and cooled s/p VF arrest/ resuscitated with ROSC 16 min   Objective:  Temp:  [91 F (32.8 C)-100.4 F (38 C)] 96.6 F (35.9 C) (06/14 1134) Pulse Rate:  [63-133] 79 (06/14 1134) Resp:  [0-23] 12 (06/14 1134) BP: (62-140)/(46-101) 91/69 mmHg (06/14 1134) SpO2:  [93 %-100 %] 97 % (06/14 1134) FiO2 (%):  [50 %-100 %] 50 % (06/14 1134) Weight:  [189 lb (85.73 kg)-198 lb 13.7 oz (90.2 kg)] 198 lb 13.7 oz (90.2 kg) (06/14 0000) Weight change:   Intake/Output from previous day: 06/13 0701 - 06/14 0700 In: 3820.9 [I.V.:3770.9; IV Piggyback:50] Out: 7829 [Urine:1125]  Intake/Output from this shift: Total I/O In: 1509.6 [I.V.:1509.6] Out: 350 [Urine:350]  Physical Exam: General appearance: Intubated and sedated Neck: no adenopathy, no carotid bruit, no JVD, supple, symmetrical, trachea midline and thyroid not enlarged, symmetric, no tenderness/mass/nodules Lungs: clear to auscultation bilaterally Heart: regular rate and rhythm, S1, S2 normal, no murmur, click, rub or gallop Extremities: extremities normal, atraumatic, no cyanosis or edema  Lab Results: Results for orders placed or performed during the hospital encounter of 11/19/15 (from the past 48 hour(s))  Triglycerides     Status: None   Collection Time: 11/19/15  8:35 PM  Result Value Ref Range   Triglycerides 105 <150 mg/dL  CBC     Status: Abnormal   Collection Time: 11/19/15  8:35 PM  Result Value Ref Range   WBC 11.2 (H) 4.0 - 10.5 K/uL   RBC 4.27 3.87 - 5.11 MIL/uL   Hemoglobin 11.9 (L) 12.0 - 15.0 g/dL   HCT 37.8 36.0 - 46.0 %   MCV 88.5 78.0 - 100.0 fL   MCH 27.9 26.0 - 34.0 pg   MCHC 31.5 30.0 - 36.0 g/dL   RDW 14.6 11.5 - 15.5 %   Platelets 367 150 - 400 K/uL  Differential     Status: Abnormal   Collection Time: 11/19/15  8:35 PM  Result Value Ref Range   Neutrophils Relative % 33 %   Lymphocytes Relative 61 %   Monocytes Relative 5 %   Eosinophils Relative 1 %   Basophils Relative 0 %   Neutro Abs 3.7 1.7 - 7.7 K/uL   Lymphs Abs 6.8 (H) 0.7 - 4.0 K/uL   Monocytes Absolute 0.6 0.1 - 1.0 K/uL   Eosinophils Absolute 0.1 0.0 - 0.7 K/uL   Basophils Absolute 0.0 0.0 - 0.1 K/uL   WBC Morphology ABSOLUTE LYMPHOCYTOSIS   Protime-INR     Status: None   Collection Time: 11/19/15  8:35 PM  Result Value Ref Range   Prothrombin Time 14.4 11.6 - 15.2 seconds   INR 1.10 0.00 - 1.49  APTT     Status: None   Collection Time: 11/19/15  8:35 PM  Result Value Ref Range   aPTT 24 24 - 37 seconds  Comprehensive metabolic panel     Status: Abnormal   Collection Time: 11/19/15  8:35 PM  Result Value Ref Range   Sodium 139 135 - 145 mmol/L   Potassium 3.5 3.5 - 5.1 mmol/L   Chloride 107 101 - 111 mmol/L   CO2 18 (L) 22 - 32 mmol/L   Glucose, Bld 229 (H) 65 - 99 mg/dL   BUN 19 6 - 20 mg/dL   Creatinine, Ser 1.32 (H) 0.44 - 1.00 mg/dL   Calcium 9.1 8.9 - 10.3 mg/dL   Total Protein 6.5 6.5 - 8.1 g/dL   Albumin 3.8 3.5 -  5.0 g/dL   AST 202 (H) 15 - 41 U/L   ALT 96 (H) 14 - 54 U/L   Alkaline Phosphatase 55 38 - 126 U/L   Total Bilirubin 0.3 0.3 - 1.2 mg/dL   GFR calc non Af Amer 42 (L) >60 mL/min   GFR calc Af Amer 49 (L) >60 mL/min    Comment: (NOTE) The eGFR has been calculated using the CKD EPI equation. This calculation has not been validated in all clinical situations. eGFR's persistently <60 mL/min signify possible Chronic Kidney Disease.    Anion gap 14 5 - 15  Lipid panel     Status: None   Collection Time: 11/19/15  8:36 PM  Result Value Ref Range   Cholesterol 181 0 - 200 mg/dL   Triglycerides 103 <150 mg/dL   HDL 66 >40 mg/dL   Total CHOL/HDL Ratio 2.7 RATIO   VLDL 21 0 - 40 mg/dL   LDL Cholesterol 94 0 - 99 mg/dL    Comment:        Total Cholesterol/HDL:CHD Risk Coronary Heart Disease Risk Table                     Men   Women  1/2 Average Risk   3.4   3.3  Average Risk       5.0   4.4  2 X Average Risk   9.6   7.1  3 X Average Risk   23.4   11.0        Use the calculated Patient Ratio above and the CHD Risk Table to determine the patient's CHD Risk.        ATP III CLASSIFICATION (LDL):  <100     mg/dL   Optimal  100-129  mg/dL   Near or Above                    Optimal  130-159  mg/dL   Borderline  160-189  mg/dL   High  >190     mg/dL   Very High   I-stat troponin, ED     Status: Abnormal   Collection Time: 11/19/15  8:39 PM  Result Value Ref Range   Troponin i, poc 0.37 (HH) 0.00 - 0.08 ng/mL   Comment NOTIFIED PHYSICIAN    Comment 3            Comment: Due to the release kinetics of cTnI, a negative result within the first hours of the onset of symptoms does not rule out myocardial infarction with certainty. If myocardial infarction is still suspected, repeat the test at appropriate intervals.   I-Stat Chem 8, ED     Status: Abnormal   Collection Time: 11/19/15  8:41 PM  Result Value Ref Range   Sodium 141 135 - 145 mmol/L   Potassium 3.6 3.5 - 5.1 mmol/L   Chloride 104 101 - 111 mmol/L   BUN 23 (H) 6 - 20 mg/dL   Creatinine, Ser 1.20 (H) 0.44 - 1.00 mg/dL   Glucose, Bld 223 (H) 65 - 99 mg/dL   Calcium, Ion 1.11 (L) 1.13 - 1.30 mmol/L   TCO2 20 0 - 100 mmol/L   Hemoglobin 13.6 12.0 - 15.0 g/dL   HCT 40.0 36.0 - 46.0 %  I-Stat CG4 Lactic Acid, ED  (not at [facility])     Status: Abnormal   Collection Time: 11/19/15  9:04 PM  Result Value Ref Range   Lactic Acid, Venous 7.20 (HH) 0.5 -  2.0 mmol/L   Comment NOTIFIED PHYSICIAN   Type and screen     Status: None   Collection Time: 11/19/15  9:15 PM  Result Value Ref Range   ABO/RH(D) A POS    Antibody Screen NEG    Sample Expiration 11/22/2015   ABO/Rh     Status: None   Collection Time: 11/19/15  9:15 PM  Result Value Ref Range   ABO/RH(D) A POS   I-Stat arterial blood gas, ED ([facility], [facility])     Status: Abnormal   Collection Time: 11/19/15  9:16 PM  Result Value Ref Range   pH, Arterial 7.257 (L) 7.350 - 7.450   pCO2 arterial 45.1 (H) 35.0 - 45.0  mmHg   pO2, Arterial 240.0 (H) 80.0 - 100.0 mmHg   Bicarbonate 20.1 20.0 - 24.0 mEq/L   TCO2 21 0 - 100 mmol/L   O2 Saturation 100.0 %   Acid-base deficit 7.0 (H) 0.0 - 2.0 mmol/L   Patient temperature 98.6 F    Collection site RADIAL, ALLEN'S TEST ACCEPTABLE    Drawn by RT    Sample type ARTERIAL   Rapid urine drug screen (hospital performed)     Status: Abnormal   Collection Time: 11/19/15  9:26 PM  Result Value Ref Range   Opiates POSITIVE (A) NONE DETECTED   Cocaine NONE DETECTED NONE DETECTED   Benzodiazepines POSITIVE (A) NONE DETECTED   Amphetamines NONE DETECTED NONE DETECTED   Tetrahydrocannabinol NONE DETECTED NONE DETECTED   Barbiturates NONE DETECTED NONE DETECTED    Comment:        DRUG SCREEN FOR MEDICAL PURPOSES ONLY.  IF CONFIRMATION IS NEEDED FOR ANY PURPOSE, NOTIFY LAB WITHIN 5 DAYS.        LOWEST DETECTABLE LIMITS FOR URINE DRUG SCREEN Drug Class       Cutoff (ng/mL) Amphetamine      1000 Barbiturate      200 Benzodiazepine   127 Tricyclics       517 Opiates          300 Cocaine          300 THC              50   Urinalysis, Routine w reflex microscopic (not at [facility])     Status: Abnormal   Collection Time: 11/19/15  9:26 PM  Result Value Ref Range   Color, Urine STRAW (A) YELLOW   APPearance CLEAR CLEAR   Specific Gravity, Urine 1.014 1.005 - 1.030   pH 6.5 5.0 - 8.0   Glucose, UA 250 (A) NEGATIVE mg/dL   Hgb urine dipstick MODERATE (A) NEGATIVE   Bilirubin Urine NEGATIVE NEGATIVE   Ketones, ur NEGATIVE NEGATIVE mg/dL   Protein, ur 100 (A) NEGATIVE mg/dL   Nitrite NEGATIVE NEGATIVE   Leukocytes, UA NEGATIVE NEGATIVE  Urine microscopic-add on     Status: Abnormal   Collection Time: 11/19/15  9:26 PM  Result Value Ref Range   Squamous Epithelial / LPF 0-5 (A) NONE SEEN   WBC, UA 0-5 0 - 5 WBC/hpf   RBC / HPF 6-30 0 - 5 RBC/hpf   Bacteria, UA FEW (A) NONE SEEN   Casts HYALINE CASTS (A) NEGATIVE  MRSA PCR Screening     Status: None    Collection Time: 11/19/15 11:28 PM  Result Value Ref Range   MRSA by PCR NEGATIVE NEGATIVE    Comment:        The GeneXpert MRSA Assay (FDA approved for NASAL specimens  only), is one component of a comprehensive MRSA colonization surveillance program. It is not intended to diagnose MRSA infection nor to guide or monitor treatment for MRSA infections.   Ethanol     Status: None   Collection Time: 11/19/15 11:35 PM  Result Value Ref Range   Alcohol, Ethyl (B) <5 <5 mg/dL    Comment:        LOWEST DETECTABLE LIMIT FOR SERUM ALCOHOL IS 5 mg/dL FOR MEDICAL PURPOSES ONLY   Brain natriuretic peptide     Status: None   Collection Time: 11/19/15 11:35 PM  Result Value Ref Range   B Natriuretic Peptide 80.0 0.0 - 100.0 pg/mL  Troponin I (q 6hr x 3)     Status: Abnormal   Collection Time: 11/19/15 11:35 PM  Result Value Ref Range   Troponin I 5.24 (HH) <0.031 ng/mL    Comment:        POSSIBLE MYOCARDIAL ISCHEMIA. SERIAL TESTING RECOMMENDED. CRITICAL RESULT CALLED TO, READ BACK BY AND VERIFIED WITH: P DAVIS,RN [id number] WILDERK   Lactic acid, plasma     Status: None   Collection Time: 11/19/15 11:35 PM  Result Value Ref Range   Lactic Acid, Venous 1.8 0.5 - 2.0 mmol/L  TSH     Status: Abnormal   Collection Time: 11/19/15 11:35 PM  Result Value Ref Range   TSH 5.260 (H) 0.350 - 4.500 uIU/mL  T4, free     Status: None   Collection Time: 11/19/15 11:35 PM  Result Value Ref Range   Free T4 0.99 0.61 - 1.12 ng/dL    Comment: (NOTE) Biotin ingestion may interfere with free T4 tests. If the results are inconsistent with the TSH level, previous test results, or the clinical presentation, then consider biotin interference. If needed, order repeat testing after stopping biotin.   Magnesium     Status: None   Collection Time: 11/19/15 11:35 PM  Result Value Ref Range   Magnesium 1.9 1.7 - 2.4 mg/dL  Glucose, capillary     Status: None   Collection Time: 11/20/15 12:05 AM    Result Value Ref Range   Glucose-Capillary 91 65 - 99 mg/dL  I-STAT 3, arterial blood gas (G3+)     Status: Abnormal   Collection Time: 11/20/15  4:10 AM  Result Value Ref Range   pH, Arterial 7.256 (L) 7.350 - 7.450   pCO2 arterial 46.0 (H) 35.0 - 45.0 mmHg   pO2, Arterial 175.0 (H) 80.0 - 100.0 mmHg   Bicarbonate 20.2 20.0 - 24.0 mEq/L   TCO2 22 0 - 100 mmol/L   O2 Saturation 99.0 %   Acid-base deficit 6.0 (H) 0.0 - 2.0 mmol/L   Patient temperature 38.0 C    Collection site RADIAL, ALLEN'S TEST ACCEPTABLE    Drawn by RT    Sample type ARTERIAL   Glucose, capillary     Status: Abnormal   Collection Time: 11/20/15  4:16 AM  Result Value Ref Range   Glucose-Capillary 133 (H) 65 - 99 mg/dL  Renal function panel     Status: Abnormal   Collection Time: 11/20/15  4:25 AM  Result Value Ref Range   Sodium 138 135 - 145 mmol/L   Potassium 3.9 3.5 - 5.1 mmol/L   Chloride 110 101 - 111 mmol/L   CO2 20 (L) 22 - 32 mmol/L   Glucose, Bld 131 (H) 65 - 99 mg/dL   BUN 18 6 - 20 mg/dL   Creatinine, Ser 0.94 0.44 - 1.00  mg/dL   Calcium 7.5 (L) 8.9 - 10.3 mg/dL   Phosphorus 2.8 2.5 - 4.6 mg/dL   Albumin 3.1 (L) 3.5 - 5.0 g/dL   GFR calc non Af Amer >60 >60 mL/min   GFR calc Af Amer >60 >60 mL/min    Comment: (NOTE) The eGFR has been calculated using the CKD EPI equation. This calculation has not been validated in all clinical situations. eGFR's persistently <60 mL/min signify possible Chronic Kidney Disease.    Anion gap 8 5 - 15  CBC with Differential/Platelet     Status: Abnormal   Collection Time: 11/20/15  4:25 AM  Result Value Ref Range   WBC 9.7 4.0 - 10.5 K/uL   RBC 3.69 (L) 3.87 - 5.11 MIL/uL   Hemoglobin 10.6 (L) 12.0 - 15.0 g/dL    Comment: DELTA CHECK NOTED REPEATED TO VERIFY    HCT 33.2 (L) 36.0 - 46.0 %   MCV 90.0 78.0 - 100.0 fL   MCH 28.5 26.0 - 34.0 pg   MCHC 31.6 30.0 - 36.0 g/dL   RDW 15.0 11.5 - 15.5 %   Platelets 320 150 - 400 K/uL   Neutrophils Relative %  73 %   Neutro Abs 7.1 1.7 - 7.7 K/uL   Lymphocytes Relative 21 %   Lymphs Abs 2.0 0.7 - 4.0 K/uL   Monocytes Relative 6 %   Monocytes Absolute 0.6 0.1 - 1.0 K/uL   Eosinophils Relative 0 %   Eosinophils Absolute 0.0 0.0 - 0.7 K/uL   Basophils Relative 0 %   Basophils Absolute 0.0 0.0 - 0.1 K/uL  Magnesium     Status: None   Collection Time: 11/20/15  4:25 AM  Result Value Ref Range   Magnesium 1.9 1.7 - 2.4 mg/dL  Triglycerides     Status: None   Collection Time: 11/20/15  4:25 AM  Result Value Ref Range   Triglycerides 82 <150 mg/dL  Troponin I (q 6hr x 3)     Status: Abnormal   Collection Time: 11/20/15  4:25 AM  Result Value Ref Range   Troponin I 12.28 (HH) <0.031 ng/mL    Comment:        POSSIBLE MYOCARDIAL ISCHEMIA. SERIAL TESTING RECOMMENDED. CRITICAL VALUE NOTED.  VALUE IS CONSISTENT WITH PREVIOUSLY REPORTED AND CALLED VALUE.   Lactic acid, plasma     Status: Abnormal   Collection Time: 11/20/15  4:25 AM  Result Value Ref Range   Lactic Acid, Venous 3.6 (HH) 0.5 - 2.0 mmol/L    Comment: CRITICAL RESULT CALLED TO, READ BACK BY AND VERIFIED WITH: P DAVIS,RN [id number] WILDERK   Protime-INR     Status: None   Collection Time: 11/20/15  4:25 AM  Result Value Ref Range   Prothrombin Time 15.0 11.6 - 15.2 seconds   INR 1.16 0.00 - 1.49  Phosphorus     Status: None   Collection Time: 11/20/15  4:25 AM  Result Value Ref Range   Phosphorus 2.9 2.5 - 4.6 mg/dL  Glucose, capillary     Status: Abnormal   Collection Time: 11/20/15  7:30 AM  Result Value Ref Range   Glucose-Capillary 144 (H) 65 - 99 mg/dL   Comment 1 Capillary Specimen     Imaging: Imaging results have been reviewed I have personally reviewed  Tele- NSR  Assessment/Plan:   1. Active Problems: 2.   Cardiac arrest ([place]) 3.   Time Spent Directly with Patient:  20 minutes  Length of Stay:  LOS:  1 day    Day #1 VF arrest, CPR, resuscitated, ROSC 16 min. VDRF intubated and sedated , artic  sun. EKG w/o acute changes. Trop + 12---> cycle. BP soft on Neo (max dose) and Levophed, 2D pending. Worry about CGS. If 2D nl (no focal WMA) will wait until she rewarms and her neuro status declares itself. If she has a Focal WMA may need cor angio to define her anatomy in light of BP issues.  I have reviewed the 2D echo which shows EF 40% with inferolateral WMA. In light of this as well as trop 12 and low BP on 2 pressors I think it's reasonable to do cor angio today to define anatomy. I have discussed with Dr Tamala Julian and have gotten verbal consent over the phone from her Sister who is POA.   Quay Burow 11/20/2015, 12:23 PM

## 2015-11-20 NOTE — Procedures (Signed)
Central Venous Catheter Insertion Procedure Note Brittany Mason [id number] 12-31-1950  Procedure: Insertion of Central Venous Catheter Indications: Drug and/or fluid administration  Procedure Details Consent: Risks of procedure as well as the alternatives and risks of each were explained to the (patient/caregiver).  Consent for procedure obtained. Time Out: Verified patient identification, verified procedure, site/side was marked, verified correct patient position, special equipment/implants available, medications/allergies/relevent history reviewed, required imaging and test results available.  Performed  Maximum sterile technique was used including antiseptics, cap, gloves, gown, hand hygiene, mask and sheet. Skin prep: Chlorhexidine; local anesthetic administered A antimicrobial bonded/coated triple lumen catheter was placed in the right internal jugular vein using the Seldinger technique. Ultrasound guidance used.Yes.   Catheter placed to 16 cm. Blood aspirated via all 3 ports and then flushed x 3. Line sutured x 2 and dressing applied.  Evaluation Blood flow good Complications: No apparent complications Patient did tolerate procedure well. Chest X-ray ordered to verify placement.  CXR: pending.  Georgann Housekeeper, AGACNP-BC [facility] Pulmonology/Critical Care Pager [phone] or [phone]  11/20/2015 12:58 PM

## 2015-11-20 NOTE — H&P (View-Only) (Signed)
Per Sood PRVC to PC at approx 1230. Per Eddie Dibbles, NP PC to [facility] (original settings) at approx 1305. Scheduled ABG for 1330 will now be completed at approx 1405. RT placed Bite block.

## 2015-11-20 NOTE — Progress Notes (Signed)
PCCM Attending Note:  Patient examined at bedside. Hypotension secondary to sedation. Back on Propofol drip after patient sat up in bed and attempted to reposition. Friend at bedside confirms she has chronic back pain. Reportedly still not tracking or following commands but did have her eyes open as she was pushing herself forward on the bed. Lightening sedation & utilizing prn Versed with decreased Fentanyl drip.   Sonia Baller Ashok Cordia, M.D. [facility] Pulmonary & Critical Care Pager:  [phone] After 3pm or if no response, call [phone] 1:52 AM 11/20/2015

## 2015-11-20 NOTE — Interval H&P Note (Signed)
History and Physical Interval Note:  11/20/2015 5:37 PM  [name]  has presented today for surgery, with the diagnosis of cp  The various methods of treatment have been discussed with the patient and family. After consideration of risks, benefits and other options for treatment, the patient has consented to  Procedure(s): Left Heart Cath and Coronary Angiography (N/A) as a surgical intervention .  [name] history has been reviewed, patient examined, no change in status, stable for surgery.  I have reviewed the patient's chart and labs.  Questions were answered to the patient's satisfaction.     Belva Crome III

## 2015-11-20 NOTE — Interval H&P Note (Signed)
Cath Lab Visit (complete for each Cath Lab visit)  Clinical Evaluation Leading to the Procedure:   ACS: Yes.    Non-ACS:    Anginal Classification: No Symptoms  Anti-ischemic medical therapy: Maximal Therapy (2 or more classes of medications)  Non-Invasive Test Results: No non-invasive testing performed  Prior CABG: No previous CABG      History and Physical Interval Note:  11/20/2015 5:37 PM  Brittany Mason  has presented today for surgery, with the diagnosis of cp  The various methods of treatment have been discussed with the patient and family. After consideration of risks, benefits and other options for treatment, the patient has consented to  Procedure(s): Left Heart Cath and Coronary Angiography (N/A) as a surgical intervention .  [name] history has been reviewed, patient examined, no change in status, stable for surgery.  I have reviewed the patient's chart and labs.  Questions were answered to the patient's satisfaction.     Belva Crome III

## 2015-11-20 NOTE — Procedures (Signed)
ELECTROENCEPHALOGRAM REPORT  Date of Study: 11/20/2015  Patient's Name: Brittany Mason MRN: [id number] Date of Birth: 03-Jul-1950  Referring Provider: Rene Kocher  Indication: 65 year old female status post cardiac arrest and witnessed seizure activity, now intubated, sedated and under cooling protocol  Medications: Propofol Fentanyl  Technical Summary: This is a multichannel digital EEG recording, using the international 10-20 placement system with electrodes applied with paste and impedances below 5000 ohms.    Description: The EEG background is symmetric with diffuse background slowing and suppression, without any discernible posterior dominant rhythm or reactivity.  No focal or generalized epileptiform discharges seen.  Stage II sleep is not seen.  Hyperventilation and photic stimulation were not performed  ECG revealed normal cardiac rate and rhythm.  Impression: This is an abnormal EEG due to diffuse slowing and suppression of the background.  This is indicative of diffuse cerebral dysfunction, which may be due to toxic-metabolic or infectious or hypoxic etiology, but also may be secondary to sedation.  Adam R. Tomi Likens, DO

## 2015-11-20 NOTE — Progress Notes (Signed)
CRITICAL VALUE ALERT  Critical value received: Lactic acid  Date of notification: 11/20/2015  Time of notification:  0520  Critical value read back:Yes.    Nurse who received alert:  Arnetha Courser   MD notified (1st Jayvyn Haselton):  [facility] notified

## 2015-11-20 NOTE — Progress Notes (Signed)
Per Sood PRVC to PC at approx 1230. Per Eddie Dibbles, NP PC to [facility] (original settings) at approx 1305. Scheduled ABG for 1330 will now be completed at approx 1405. RT placed Bite block.

## 2015-11-20 NOTE — Progress Notes (Signed)
[facility] Progress Note Patient Name: Brittany Mason DOB: 04/26/1951 MRN: [id number]   Date of Service  11/20/2015  HPI/Events of Note  Off sedation - not really purposeful.   eICU Interventions  Discussed with Dr. Ashok Cordia > will restart sedation and cool to 36 degrees C.     Intervention Category Major Interventions: Other:  Lysle Dingwall 11/20/2015, 2:47 AM

## 2015-11-21 ENCOUNTER — Inpatient Hospital Stay (HOSPITAL_COMMUNITY): Payer: 59

## 2015-11-21 ENCOUNTER — Encounter (HOSPITAL_COMMUNITY): Payer: Self-pay | Admitting: Interventional Cardiology

## 2015-11-21 LAB — BLOOD GAS, ARTERIAL
ACID-BASE DEFICIT: 5.7 mmol/L — AB (ref 0.0–2.0)
BICARBONATE: 17.8 meq/L — AB (ref 20.0–24.0)
DRAWN BY: 345601
FIO2: 0.4
O2 Saturation: 98.8 %
PEEP: 5 cmH2O
Patient temperature: 98.6
RATE: 24 resp/min
TCO2: 18.7 mmol/L (ref 0–100)
VT: 530 mL
pCO2 arterial: 27.4 mmHg — ABNORMAL LOW (ref 35.0–45.0)
pH, Arterial: 7.429 (ref 7.350–7.450)
pO2, Arterial: 139 mmHg — ABNORMAL HIGH (ref 80.0–100.0)

## 2015-11-21 LAB — BASIC METABOLIC PANEL
Anion gap: 8 (ref 5–15)
BUN: 11 mg/dL (ref 6–20)
CHLORIDE: 111 mmol/L (ref 101–111)
CO2: 19 mmol/L — AB (ref 22–32)
Calcium: 7.6 mg/dL — ABNORMAL LOW (ref 8.9–10.3)
Creatinine, Ser: 0.69 mg/dL (ref 0.44–1.00)
GFR calc non Af Amer: >60 mL/min (ref >60)
Glucose, Bld: 115 mg/dL — ABNORMAL HIGH (ref 65–99)
POTASSIUM: 3.4 mmol/L — AB (ref 3.5–5.1)
SODIUM: 138 mmol/L (ref 135–145)

## 2015-11-21 LAB — HEMOGLOBIN A1C
HEMOGLOBIN A1C: 5.7 % — AB (ref 4.8–5.6)
Mean Plasma Glucose: 117 mg/dL

## 2015-11-21 LAB — GLUCOSE, CAPILLARY
GLUCOSE-CAPILLARY: 117 mg/dL — AB (ref 65–99)
GLUCOSE-CAPILLARY: 112 mg/dL — AB (ref 65–99)
Glucose-Capillary: 107 mg/dL — ABNORMAL HIGH (ref 65–99)
Glucose-Capillary: 98 mg/dL (ref 65–99)
Glucose-Capillary: 69 mg/dL (ref 65–99)

## 2015-11-21 LAB — CBC
HCT: 32.3 % — ABNORMAL LOW (ref 36.0–46.0)
HEMOGLOBIN: 10.1 g/dL — AB (ref 12.0–15.0)
MCH: 27.3 pg (ref 26.0–34.0)
MCHC: 31.3 g/dL (ref 30.0–36.0)
MCV: 87.3 fL (ref 78.0–100.0)
PLATELETS: 228 10*3/uL (ref 150–400)
RBC: 3.7 MIL/uL — AB (ref 3.87–5.11)
RDW: 15.1 % (ref 11.5–15.5)
WBC: 5.2 10*3/uL (ref 4.0–10.5)

## 2015-11-21 LAB — TRIGLYCERIDES: TRIGLYCERIDES: 186 mg/dL — AB (ref <150)

## 2015-11-21 LAB — MAGNESIUM: MAGNESIUM: 1.9 mg/dL (ref 1.7–2.4)

## 2015-11-21 LAB — PROTIME-INR
INR: 1.24 (ref 0.00–1.49)
PROTHROMBIN TIME: 15.7 s — AB (ref 11.6–15.2)

## 2015-11-21 LAB — PHOSPHORUS: PHOSPHORUS: 1.6 mg/dL — AB (ref 2.5–4.6)

## 2015-11-21 LAB — TROPONIN I
TROPONIN I: 45.6 ng/mL — AB (ref <0.031)
TROPONIN I: 44.08 ng/mL — AB (ref <0.031)

## 2015-11-21 MED ORDER — POTASSIUM CHLORIDE 20 MEQ/15ML (10%) PO SOLN
40.0000 meq | Freq: Two times a day (BID) | ORAL | Status: AC
  Administered 2015-11-21 (×2): 40 meq
  Filled 2015-11-21 (×2): qty 30

## 2015-11-21 MED ORDER — ASPIRIN 325 MG PO TABS
325.0000 mg | ORAL_TABLET | Freq: Every day | ORAL | Status: DC
  Administered 2015-11-21 – 2015-11-25 (×5): 325 mg via ORAL
  Filled 2015-11-21 (×5): qty 1

## 2015-11-21 MED ORDER — LEVOTHYROXINE SODIUM 100 MCG IV SOLR
75.0000 ug | Freq: Every day | INTRAVENOUS | Status: DC
  Administered 2015-11-21 – 2015-11-24 (×4): 75 ug via INTRAVENOUS
  Filled 2015-11-21 (×4): qty 5

## 2015-11-21 MED ORDER — POTASSIUM CHLORIDE 20 MEQ/15ML (10%) PO SOLN: 40.0000 meq | Freq: Two times a day (BID) | ORAL | Status: DC

## 2015-11-21 MED ORDER — SODIUM CHLORIDE 0.9% FLUSH: 10.0000 mL | INTRAVENOUS | Status: DC | PRN

## 2015-11-21 MED ORDER — SODIUM CHLORIDE 0.9% FLUSH
10.0000 mL | Freq: Two times a day (BID) | INTRAVENOUS | Status: DC
  Administered 2015-11-21 – 2015-11-23 (×5): 10 mL

## 2015-11-21 MED ORDER — SODIUM CHLORIDE 0.9 % IV SOLN
INTRAVENOUS | Status: DC
  Administered 2015-11-22 – 2015-11-23 (×2): via INTRAVENOUS

## 2015-11-21 MED ORDER — FUROSEMIDE 10 MG/ML IJ SOLN
40.0000 mg | Freq: Two times a day (BID) | INTRAMUSCULAR | Status: DC
  Administered 2015-11-21 – 2015-11-24 (×6): 40 mg via INTRAVENOUS
  Filled 2015-11-21 (×6): qty 4

## 2015-11-21 MED ORDER — DEXTROSE 50 % IV SOLN
25.0000 mL | Freq: Once | INTRAVENOUS | Status: AC
  Administered 2015-11-21: 25 mL via INTRAVENOUS
  Filled 2015-11-21: qty 50

## 2015-11-21 MED ORDER — POTASSIUM CHLORIDE 20 MEQ/15ML (10%) PO SOLN
20.0000 meq | ORAL | Status: AC
  Administered 2015-11-21 (×2): 20 meq
  Filled 2015-11-21 (×3): qty 15

## 2015-11-21 NOTE — Progress Notes (Signed)
Subjective:  Intubated/ sedated/ s/p VF arrest/ resuscitated/ VDRF/ Artic Sun Day #2 rewarming   Objective:  Temp:  [91 F (32.8 C)-100.4 F (38 C)] 95.2 F (35.1 C) (06/15 0600) Pulse Rate:  [0-100] 59 (06/15 0700) Resp:  [0-67] 21 (06/15 0700) BP: (75-140)/(46-95) 87/58 mmHg (06/15 0700) SpO2:  [0 %-100 %] 100 % (06/15 0700) FiO2 (%):  [40 %-55 %] 40 % (06/15 0346) Weight:  [198 lb 13.7 oz (90.2 kg)-205 lb 7.9 oz (93.21 kg)] 205 lb 7.9 oz (93.21 kg) (06/15 0500) Weight change: 9 lb 13.7 oz (4.47 kg)  Intake/Output from previous day: 06/14 0701 - 06/15 0700 In: 3658.6 [I.V.:3558.6; NG/GT:50; IV Piggyback:50] Out: 1920 [Urine:1920]  Intake/Output from this shift:    Physical Exam: General appearance: Intubated and sedated Neck: no adenopathy, no carotid bruit, no JVD, supple, symmetrical, trachea midline and thyroid not enlarged, symmetric, no tenderness/mass/nodules Lungs: clear to auscultation bilaterally Heart: regular rate and rhythm, S1, S2 normal, no murmur, click, rub or gallop Extremities: extremities normal, atraumatic, no cyanosis or edema  Lab Results: Results for orders placed or performed during the hospital encounter of 11/19/15 (from the past 48 hour(s))  Triglycerides     Status: None   Collection Time: 11/19/15  8:35 PM  Result Value Ref Range   Triglycerides 105 <150 mg/dL  CBC     Status: Abnormal   Collection Time: 11/19/15  8:35 PM  Result Value Ref Range   WBC 11.2 (H) 4.0 - 10.5 K/uL   RBC 4.27 3.87 - 5.11 MIL/uL   Hemoglobin 11.9 (L) 12.0 - 15.0 g/dL   HCT 37.8 36.0 - 46.0 %   MCV 88.5 78.0 - 100.0 fL   MCH 27.9 26.0 - 34.0 pg   MCHC 31.5 30.0 - 36.0 g/dL   RDW 14.6 11.5 - 15.5 %   Platelets 367 150 - 400 K/uL  Differential     Status: Abnormal   Collection Time: 11/19/15  8:35 PM  Result Value Ref Range   Neutrophils Relative % 33 %   Lymphocytes Relative 61 %   Monocytes Relative 5 %   Eosinophils Relative 1 %   Basophils  Relative 0 %   Neutro Abs 3.7 1.7 - 7.7 K/uL   Lymphs Abs 6.8 (H) 0.7 - 4.0 K/uL   Monocytes Absolute 0.6 0.1 - 1.0 K/uL   Eosinophils Absolute 0.1 0.0 - 0.7 K/uL   Basophils Absolute 0.0 0.0 - 0.1 K/uL   WBC Morphology ABSOLUTE LYMPHOCYTOSIS   Protime-INR     Status: None   Collection Time: 11/19/15  8:35 PM  Result Value Ref Range   Prothrombin Time 14.4 11.6 - 15.2 seconds   INR 1.10 0.00 - 1.49  APTT     Status: None   Collection Time: 11/19/15  8:35 PM  Result Value Ref Range   aPTT 24 24 - 37 seconds  Comprehensive metabolic panel     Status: Abnormal   Collection Time: 11/19/15  8:35 PM  Result Value Ref Range   Sodium 139 135 - 145 mmol/L   Potassium 3.5 3.5 - 5.1 mmol/L   Chloride 107 101 - 111 mmol/L   CO2 18 (L) 22 - 32 mmol/L   Glucose, Bld 229 (H) 65 - 99 mg/dL   BUN 19 6 - 20 mg/dL   Creatinine, Ser 1.32 (H) 0.44 - 1.00 mg/dL   Calcium 9.1 8.9 - 10.3 mg/dL   Total Protein 6.5 6.5 - 8.1 g/dL  Albumin 3.8 3.5 - 5.0 g/dL   AST 202 (H) 15 - 41 U/L   ALT 96 (H) 14 - 54 U/L   Alkaline Phosphatase 55 38 - 126 U/L   Total Bilirubin 0.3 0.3 - 1.2 mg/dL   GFR calc non Af Amer 42 (L) >60 mL/min   GFR calc Af Amer 49 (L) >60 mL/min    Comment: (NOTE) The eGFR has been calculated using the CKD EPI equation. This calculation has not been validated in all clinical situations. eGFR's persistently <60 mL/min signify possible Chronic Kidney Disease.    Anion gap 14 5 - 15  Pathologist smear review     Status: None   Collection Time: 11/19/15  8:35 PM  Result Value Ref Range   Path Review ABSOLUTE LYMPHOCYTOSIS     Comment: Reviewed by Chrystie Nose. Saralyn Pilar, M.D. 11/20/15.   Lipid panel     Status: None   Collection Time: 11/19/15  8:36 PM  Result Value Ref Range   Cholesterol 181 0 - 200 mg/dL   Triglycerides 103 <150 mg/dL   HDL 66 >40 mg/dL   Total CHOL/HDL Ratio 2.7 RATIO   VLDL 21 0 - 40 mg/dL   LDL Cholesterol 94 0 - 99 mg/dL    Comment:        Total  Cholesterol/HDL:CHD Risk Coronary Heart Disease Risk Table                     Men   Women  1/2 Average Risk   3.4   3.3  Average Risk       5.0   4.4  2 X Average Risk   9.6   7.1  3 X Average Risk  23.4   11.0        Use the calculated Patient Ratio above and the CHD Risk Table to determine the patient's CHD Risk.        ATP III CLASSIFICATION (LDL):  <100     mg/dL   Optimal  100-129  mg/dL   Near or Above                    Optimal  130-159  mg/dL   Borderline  160-189  mg/dL   High  >190     mg/dL   Very High   I-stat troponin, ED     Status: Abnormal   Collection Time: 11/19/15  8:39 PM  Result Value Ref Range   Troponin i, poc 0.37 (HH) 0.00 - 0.08 ng/mL   Comment NOTIFIED PHYSICIAN    Comment 3            Comment: Due to the release kinetics of cTnI, a negative result within the first hours of the onset of symptoms does not rule out myocardial infarction with certainty. If myocardial infarction is still suspected, repeat the test at appropriate intervals.   I-Stat Chem 8, ED     Status: Abnormal   Collection Time: 11/19/15  8:41 PM  Result Value Ref Range   Sodium 141 135 - 145 mmol/L   Potassium 3.6 3.5 - 5.1 mmol/L   Chloride 104 101 - 111 mmol/L   BUN 23 (H) 6 - 20 mg/dL   Creatinine, Ser 1.20 (H) 0.44 - 1.00 mg/dL   Glucose, Bld 223 (H) 65 - 99 mg/dL   Calcium, Ion 1.11 (L) 1.13 - 1.30 mmol/L   TCO2 20 0 - 100 mmol/L   Hemoglobin 13.6 12.0 - 15.0  g/dL   HCT 40.0 36.0 - 46.0 %  I-Stat CG4 Lactic Acid, ED  (not at [facility])     Status: Abnormal   Collection Time: 11/19/15  9:04 PM  Result Value Ref Range   Lactic Acid, Venous 7.20 (HH) 0.5 - 2.0 mmol/L   Comment NOTIFIED PHYSICIAN   Type and screen     Status: None   Collection Time: 11/19/15  9:15 PM  Result Value Ref Range   ABO/RH(D) A POS    Antibody Screen NEG    Sample Expiration 11/22/2015   ABO/Rh     Status: None   Collection Time: 11/19/15  9:15 PM  Result Value Ref Range   ABO/RH(D) A POS     I-Stat arterial blood gas, ED ([facility], [facility])     Status: Abnormal   Collection Time: 11/19/15  9:16 PM  Result Value Ref Range   pH, Arterial 7.257 (L) 7.350 - 7.450   pCO2 arterial 45.1 (H) 35.0 - 45.0 mmHg   pO2, Arterial 240.0 (H) 80.0 - 100.0 mmHg   Bicarbonate 20.1 20.0 - 24.0 mEq/L   TCO2 21 0 - 100 mmol/L   O2 Saturation 100.0 %   Acid-base deficit 7.0 (H) 0.0 - 2.0 mmol/L   Patient temperature 98.6 F    Collection site RADIAL, ALLEN'S TEST ACCEPTABLE    Drawn by RT    Sample type ARTERIAL   Rapid urine drug screen (hospital performed)     Status: Abnormal   Collection Time: 11/19/15  9:26 PM  Result Value Ref Range   Opiates POSITIVE (A) NONE DETECTED   Cocaine NONE DETECTED NONE DETECTED   Benzodiazepines POSITIVE (A) NONE DETECTED   Amphetamines NONE DETECTED NONE DETECTED   Tetrahydrocannabinol NONE DETECTED NONE DETECTED   Barbiturates NONE DETECTED NONE DETECTED    Comment:        DRUG SCREEN FOR MEDICAL PURPOSES ONLY.  IF CONFIRMATION IS NEEDED FOR ANY PURPOSE, NOTIFY LAB WITHIN 5 DAYS.        LOWEST DETECTABLE LIMITS FOR URINE DRUG SCREEN Drug Class       Cutoff (ng/mL) Amphetamine      1000 Barbiturate      200 Benzodiazepine   034 Tricyclics       917 Opiates          300 Cocaine          300 THC              50   Urinalysis, Routine w reflex microscopic (not at [facility])     Status: Abnormal   Collection Time: 11/19/15  9:26 PM  Result Value Ref Range   Color, Urine STRAW (A) YELLOW   APPearance CLEAR CLEAR   Specific Gravity, Urine 1.014 1.005 - 1.030   pH 6.5 5.0 - 8.0   Glucose, UA 250 (A) NEGATIVE mg/dL   Hgb urine dipstick MODERATE (A) NEGATIVE   Bilirubin Urine NEGATIVE NEGATIVE   Ketones, ur NEGATIVE NEGATIVE mg/dL   Protein, ur 100 (A) NEGATIVE mg/dL   Nitrite NEGATIVE NEGATIVE   Leukocytes, UA NEGATIVE NEGATIVE  Urine microscopic-add on     Status: Abnormal   Collection Time: 11/19/15  9:26 PM  Result Value Ref Range   Squamous Epithelial /  LPF 0-5 (A) NONE SEEN   WBC, UA 0-5 0 - 5 WBC/hpf   RBC / HPF 6-30 0 - 5 RBC/hpf   Bacteria, UA FEW (A) NONE SEEN   Casts HYALINE CASTS (A) NEGATIVE  MRSA PCR Screening     Status: None   Collection Time: 11/19/15 11:28 PM  Result Value Ref Range   MRSA by PCR NEGATIVE NEGATIVE    Comment:        The GeneXpert MRSA Assay (FDA approved for NASAL specimens only), is one component of a comprehensive MRSA colonization surveillance program. It is not intended to diagnose MRSA infection nor to guide or monitor treatment for MRSA infections.   Ethanol     Status: None   Collection Time: 11/19/15 11:35 PM  Result Value Ref Range   Alcohol, Ethyl (B) <5 <5 mg/dL    Comment:        LOWEST DETECTABLE LIMIT FOR SERUM ALCOHOL IS 5 mg/dL FOR MEDICAL PURPOSES ONLY   Brain natriuretic peptide     Status: None   Collection Time: 11/19/15 11:35 PM  Result Value Ref Range   B Natriuretic Peptide 80.0 0.0 - 100.0 pg/mL  Troponin I (q 6hr x 3)     Status: Abnormal   Collection Time: 11/19/15 11:35 PM  Result Value Ref Range   Troponin I 5.24 (HH) <0.031 ng/mL    Comment:        POSSIBLE MYOCARDIAL ISCHEMIA. SERIAL TESTING RECOMMENDED. CRITICAL RESULT CALLED TO, READ BACK BY AND VERIFIED WITH: P DAVIS,RN [id number] WILDERK   Lactic acid, plasma     Status: None   Collection Time: 11/19/15 11:35 PM  Result Value Ref Range   Lactic Acid, Venous 1.8 0.5 - 2.0 mmol/L  TSH     Status: Abnormal   Collection Time: 11/19/15 11:35 PM  Result Value Ref Range   TSH 5.260 (H) 0.350 - 4.500 uIU/mL  T4, free     Status: None   Collection Time: 11/19/15 11:35 PM  Result Value Ref Range   Free T4 0.99 0.61 - 1.12 ng/dL    Comment: (NOTE) Biotin ingestion may interfere with free T4 tests. If the results are inconsistent with the TSH level, previous test results, or the clinical presentation, then consider biotin interference. If needed, order repeat testing after stopping biotin.   Magnesium      Status: None   Collection Time: 11/19/15 11:35 PM  Result Value Ref Range   Magnesium 1.9 1.7 - 2.4 mg/dL  Glucose, capillary     Status: None   Collection Time: 11/20/15 12:05 AM  Result Value Ref Range   Glucose-Capillary 91 65 - 99 mg/dL  I-STAT 3, arterial blood gas (G3+)     Status: Abnormal   Collection Time: 11/20/15  4:10 AM  Result Value Ref Range   pH, Arterial 7.256 (L) 7.350 - 7.450   pCO2 arterial 46.0 (H) 35.0 - 45.0 mmHg   pO2, Arterial 175.0 (H) 80.0 - 100.0 mmHg   Bicarbonate 20.2 20.0 - 24.0 mEq/L   TCO2 22 0 - 100 mmol/L   O2 Saturation 99.0 %   Acid-base deficit 6.0 (H) 0.0 - 2.0 mmol/L   Patient temperature 38.0 C    Collection site RADIAL, ALLEN'S TEST ACCEPTABLE    Drawn by RT    Sample type ARTERIAL   Glucose, capillary     Status: Abnormal   Collection Time: 11/20/15  4:16 AM  Result Value Ref Range   Glucose-Capillary 133 (H) 65 - 99 mg/dL  Renal function panel     Status: Abnormal   Collection Time: 11/20/15  4:25 AM  Result Value Ref Range   Sodium 138 135 - 145 mmol/L   Potassium 3.9  3.5 - 5.1 mmol/L   Chloride 110 101 - 111 mmol/L   CO2 20 (L) 22 - 32 mmol/L   Glucose, Bld 131 (H) 65 - 99 mg/dL   BUN 18 6 - 20 mg/dL   Creatinine, Ser 0.94 0.44 - 1.00 mg/dL   Calcium 7.5 (L) 8.9 - 10.3 mg/dL   Phosphorus 2.8 2.5 - 4.6 mg/dL   Albumin 3.1 (L) 3.5 - 5.0 g/dL   GFR calc non Af Amer >60 >60 mL/min   GFR calc Af Amer >60 >60 mL/min    Comment: (NOTE) The eGFR has been calculated using the CKD EPI equation. This calculation has not been validated in all clinical situations. eGFR's persistently <60 mL/min signify possible Chronic Kidney Disease.    Anion gap 8 5 - 15  CBC with Differential/Platelet     Status: Abnormal   Collection Time: 11/20/15  4:25 AM  Result Value Ref Range   WBC 9.7 4.0 - 10.5 K/uL   RBC 3.69 (L) 3.87 - 5.11 MIL/uL   Hemoglobin 10.6 (L) 12.0 - 15.0 g/dL    Comment: DELTA CHECK NOTED REPEATED TO VERIFY    HCT 33.2  (L) 36.0 - 46.0 %   MCV 90.0 78.0 - 100.0 fL   MCH 28.5 26.0 - 34.0 pg   MCHC 31.6 30.0 - 36.0 g/dL   RDW 15.0 11.5 - 15.5 %   Platelets 320 150 - 400 K/uL   Neutrophils Relative % 73 %   Neutro Abs 7.1 1.7 - 7.7 K/uL   Lymphocytes Relative 21 %   Lymphs Abs 2.0 0.7 - 4.0 K/uL   Monocytes Relative 6 %   Monocytes Absolute 0.6 0.1 - 1.0 K/uL   Eosinophils Relative 0 %   Eosinophils Absolute 0.0 0.0 - 0.7 K/uL   Basophils Relative 0 %   Basophils Absolute 0.0 0.0 - 0.1 K/uL  Magnesium     Status: None   Collection Time: 11/20/15  4:25 AM  Result Value Ref Range   Magnesium 1.9 1.7 - 2.4 mg/dL  Triglycerides     Status: None   Collection Time: 11/20/15  4:25 AM  Result Value Ref Range   Triglycerides 82 <150 mg/dL  Troponin I (q 6hr x 3)     Status: Abnormal   Collection Time: 11/20/15  4:25 AM  Result Value Ref Range   Troponin I 12.28 (HH) <0.031 ng/mL    Comment:        POSSIBLE MYOCARDIAL ISCHEMIA. SERIAL TESTING RECOMMENDED. CRITICAL VALUE NOTED.  VALUE IS CONSISTENT WITH PREVIOUSLY REPORTED AND CALLED VALUE.   Lactic acid, plasma     Status: Abnormal   Collection Time: 11/20/15  4:25 AM  Result Value Ref Range   Lactic Acid, Venous 3.6 (HH) 0.5 - 2.0 mmol/L    Comment: CRITICAL RESULT CALLED TO, READ BACK BY AND VERIFIED WITH: P DAVIS,RN [id number] WILDERK   Protime-INR     Status: None   Collection Time: 11/20/15  4:25 AM  Result Value Ref Range   Prothrombin Time 15.0 11.6 - 15.2 seconds   INR 1.16 0.00 - 1.49  Phosphorus     Status: None   Collection Time: 11/20/15  4:25 AM  Result Value Ref Range   Phosphorus 2.9 2.5 - 4.6 mg/dL  Glucose, capillary     Status: Abnormal   Collection Time: 11/20/15  7:30 AM  Result Value Ref Range   Glucose-Capillary 144 (H) 65 - 99 mg/dL   Comment 1 Capillary Specimen  Glucose, capillary     Status: Abnormal   Collection Time: 11/20/15 11:38 AM  Result Value Ref Range   Glucose-Capillary 135 (H) 65 - 99 mg/dL    Comment 1 Capillary Specimen   Troponin I (q 6hr x 3)     Status: Abnormal   Collection Time: 11/20/15  1:00 PM  Result Value Ref Range   Troponin I 25.78 (HH) <0.031 ng/mL    Comment:        POSSIBLE MYOCARDIAL ISCHEMIA. SERIAL TESTING RECOMMENDED. CRITICAL VALUE NOTED.  VALUE IS CONSISTENT WITH PREVIOUSLY REPORTED AND CALLED VALUE.   Lactic acid, plasma     Status: None   Collection Time: 11/20/15  1:00 PM  Result Value Ref Range   Lactic Acid, Venous 1.2 0.5 - 2.0 mmol/L  Heparin level (unfractionated)     Status: None   Collection Time: 11/20/15  1:00 PM  Result Value Ref Range   Heparin Unfractionated 0.31 0.30 - 0.70 IU/mL    Comment:        IF HEPARIN RESULTS ARE BELOW EXPECTED VALUES, AND PATIENT DOSAGE HAS BEEN CONFIRMED, SUGGEST FOLLOW UP TESTING OF ANTITHROMBIN III LEVELS.   I-STAT 3, arterial blood gas (G3+)     Status: Abnormal   Collection Time: 11/20/15  2:40 PM  Result Value Ref Range   pH, Arterial 7.157 (LL) 7.350 - 7.450   pCO2 arterial 55.1 (H) 35.0 - 45.0 mmHg   pO2, Arterial 119.0 (H) 80.0 - 100.0 mmHg   Bicarbonate 19.5 (L) 20.0 - 24.0 mEq/L   TCO2 21 0 - 100 mmol/L   O2 Saturation 97.0 %   Acid-base deficit 9.0 (H) 0.0 - 2.0 mmol/L   Patient temperature 37.1 C    Collection site RADIAL, ALLEN'S TEST ACCEPTABLE    Drawn by RT    Sample type ARTERIAL    Comment NOTIFIED PHYSICIAN   Glucose, capillary     Status: Abnormal   Collection Time: 11/20/15  3:28 PM  Result Value Ref Range   Glucose-Capillary 121 (H) 65 - 99 mg/dL   Comment 1 Venous Specimen   POCT Activated clotting time     Status: None   Collection Time: 11/20/15  6:00 PM  Result Value Ref Range   Activated Clotting Time 136 seconds  Glucose, capillary     Status: Abnormal   Collection Time: 11/20/15  8:13 PM  Result Value Ref Range   Glucose-Capillary 103 (H) 65 - 99 mg/dL   Comment 1 Venous Specimen    Comment 2 Notify RN   Basic metabolic panel     Status: Abnormal    Collection Time: 11/20/15  8:15 PM  Result Value Ref Range   Sodium 139 135 - 145 mmol/L   Potassium 4.9 3.5 - 5.1 mmol/L   Chloride 111 101 - 111 mmol/L   CO2 22 22 - 32 mmol/L   Glucose, Bld 106 (H) 65 - 99 mg/dL   BUN 11 6 - 20 mg/dL   Creatinine, Ser 0.74 0.44 - 1.00 mg/dL   Calcium 7.4 (L) 8.9 - 10.3 mg/dL   GFR calc non Af Amer >60 >60 mL/min   GFR calc Af Amer >60 >60 mL/min    Comment: (NOTE) The eGFR has been calculated using the CKD EPI equation. This calculation has not been validated in all clinical situations. eGFR's persistently <60 mL/min signify possible Chronic Kidney Disease.    Anion gap 6 5 - 15  Troponin I     Status: Abnormal  Collection Time: 11/20/15  8:15 PM  Result Value Ref Range   Troponin I 29.10 (HH) <0.031 ng/mL    Comment:        POSSIBLE MYOCARDIAL ISCHEMIA. SERIAL TESTING RECOMMENDED. CRITICAL VALUE NOTED.  VALUE IS CONSISTENT WITH PREVIOUSLY REPORTED AND CALLED VALUE.   Lactic acid, plasma     Status: None   Collection Time: 11/20/15 10:05 PM  Result Value Ref Range   Lactic Acid, Venous 1.8 0.5 - 2.0 mmol/L  Glucose, capillary     Status: Abnormal   Collection Time: 11/21/15 12:00 AM  Result Value Ref Range   Glucose-Capillary 118 (H) 65 - 99 mg/dL   Comment 1 Venous Specimen    Comment 2 Notify RN   Troponin I     Status: Abnormal   Collection Time: 11/21/15  3:00 AM  Result Value Ref Range   Troponin I 45.60 (HH) <0.031 ng/mL    Comment:        POSSIBLE MYOCARDIAL ISCHEMIA. SERIAL TESTING RECOMMENDED. CRITICAL VALUE NOTED.  VALUE IS CONSISTENT WITH PREVIOUSLY REPORTED AND CALLED VALUE.   Triglycerides     Status: Abnormal   Collection Time: 11/21/15  3:00 AM  Result Value Ref Range   Triglycerides 186 (H) <150 mg/dL  CBC     Status: Abnormal   Collection Time: 11/21/15  3:00 AM  Result Value Ref Range   WBC 5.2 4.0 - 10.5 K/uL   RBC 3.70 (L) 3.87 - 5.11 MIL/uL   Hemoglobin 10.1 (L) 12.0 - 15.0 g/dL   HCT 32.3 (L) 36.0 -  46.0 %   MCV 87.3 78.0 - 100.0 fL   MCH 27.3 26.0 - 34.0 pg   MCHC 31.3 30.0 - 36.0 g/dL   RDW 15.1 11.5 - 15.5 %   Platelets 228 150 - 400 K/uL  Basic metabolic panel     Status: Abnormal   Collection Time: 11/21/15  3:00 AM  Result Value Ref Range   Sodium 138 135 - 145 mmol/L   Potassium 3.4 (L) 3.5 - 5.1 mmol/L    Comment: DELTA CHECK NOTED   Chloride 111 101 - 111 mmol/L   CO2 19 (L) 22 - 32 mmol/L   Glucose, Bld 115 (H) 65 - 99 mg/dL   BUN 11 6 - 20 mg/dL   Creatinine, Ser 0.69 0.44 - 1.00 mg/dL   Calcium 7.6 (L) 8.9 - 10.3 mg/dL   GFR calc non Af Amer >60 >60 mL/min   GFR calc Af Amer >60 >60 mL/min    Comment: (NOTE) The eGFR has been calculated using the CKD EPI equation. This calculation has not been validated in all clinical situations. eGFR's persistently <60 mL/min signify possible Chronic Kidney Disease.    Anion gap 8 5 - 15  Magnesium     Status: None   Collection Time: 11/21/15  3:00 AM  Result Value Ref Range   Magnesium 1.9 1.7 - 2.4 mg/dL  Phosphorus     Status: Abnormal   Collection Time: 11/21/15  3:00 AM  Result Value Ref Range   Phosphorus 1.6 (L) 2.5 - 4.6 mg/dL  Troponin I     Status: Abnormal   Collection Time: 11/21/15  3:00 AM  Result Value Ref Range   Troponin I 44.08 (HH) <0.031 ng/mL    Comment:        POSSIBLE MYOCARDIAL ISCHEMIA. SERIAL TESTING RECOMMENDED. CRITICAL VALUE NOTED.  VALUE IS CONSISTENT WITH PREVIOUSLY REPORTED AND CALLED VALUE.   Glucose, capillary  Status: Abnormal   Collection Time: 11/21/15  3:05 AM  Result Value Ref Range   Glucose-Capillary 117 (H) 65 - 99 mg/dL   Comment 1 Venous Specimen    Comment 2 Notify RN   Arterial Blood Gas PRN     Status: Abnormal   Collection Time: 11/21/15  3:12 AM  Result Value Ref Range   FIO2 0.40    Delivery systems VENTILATOR    Mode PRESSURE REGULATED VOLUME CONTROL    VT 530 mL   LHR 24 resp/min   Peep/cpap 5.0 cm H20   pH, Arterial 7.429 7.350 - 7.450   pCO2  arterial 27.4 (L) 35.0 - 45.0 mmHg   pO2, Arterial 139 (H) 80.0 - 100.0 mmHg   Bicarbonate 17.8 (L) 20.0 - 24.0 mEq/L   TCO2 18.7 0 - 100 mmol/L   Acid-base deficit 5.7 (H) 0.0 - 2.0 mmol/L   O2 Saturation 98.8 %   Patient temperature 98.6    Collection site RIGHT RADIAL    Drawn by [id number]    Sample type ARTERIAL DRAW    Allens test (pass/fail) PASS PASS  Protime-INR     Status: Abnormal   Collection Time: 11/21/15  4:50 AM  Result Value Ref Range   Prothrombin Time 15.7 (H) 11.6 - 15.2 seconds   INR 1.24 0.00 - 1.49    Imaging: Imaging results have been reviewed- Progressive interstitial edema  C/W pulm edema (I personally reviewed)  Tele- NSR  Assessment/Plan:   1. Active Problems: 2.   Cardiac arrest ([place]) 3.   Cardiogenic shock ([place]) 4.   NSTEMI (non-ST elevated myocardial infarction) ([place]) 5.   Time Spent Directly with Patient:  20 minutes  Length of Stay:  LOS: 2 days    Day # 2 SCD/ VF arrest/ resuscitation/ VDRF/ Artic Sun. She is being rewarmed. Her BP has been low (SBP in 90s) currently off Neo but still requiring Levophed. S/P LHC by Dr. Tamala Julian revealing a total ND LCX with faint R---> L collat. EF 40-45%. LVEDP 40 mm Hg. Groin OK.  Trop 45. I/O + 4L. Would benefit from gentle diuresis but hesitant with pressor dependant soft BP/ CGS. The decision was not to intervene on LCX given > 12 hrs from event. 2D shows no MR. Major issue will be recovery of Neuro funct given ROSC 16 min. Will cont to follow with you.   Quay Burow 11/21/2015, 8:01 AM

## 2015-11-21 NOTE — Progress Notes (Signed)
[facility] Progress Note Patient Name: Brittany Mason DOB: [birth year] MRN: [id number]   Date of Service  11/21/2015  HPI/Events of Note  Re-warmed RN notes random muscle twitches but nothing rhythmic or recurrent  eICU Interventions  Monitor Stop all sedation     Intervention Category Minor Interventions: Routine modifications to care plan (e.g. PRN medications for pain, fever)  Simonne Maffucci 11/21/2015, 4:57 PM

## 2015-11-21 NOTE — Progress Notes (Signed)
Pt water temps low for past few hours. Pt core temp 37.6. MD notified to inquire about tylenol. No new orders at this time. Will treat when pt temp reaches 101.

## 2015-11-21 NOTE — Progress Notes (Signed)
   11/21/15 1100  Clinical Encounter Type  Visited With Patient and family together  Visit Type Spiritual support  Referral From Chaplain  Consult/Referral To [place]  CHP visit patient's life partner and an additional friend in room. CHP provided supportive presence, encouragement and prayer. CHP available to follow-up as needed. Roe Coombs  11/21/2015

## 2015-11-21 NOTE — Progress Notes (Signed)
Patient's water temps continue to be low and core temp is 38 degree.  [facility] notified & will continue to monitor. [place], Ardeth Sportsman

## 2015-11-21 NOTE — Progress Notes (Signed)
PULMONARY / CRITICAL CARE MEDICINE   Name: Brittany Mason MRN: [id number] DOB: [birth year]    ADMISSION DATE:  11/19/2015 CONSULTATION DATE:  11/19/2015  REFERRING MD:  Daneen Schick, M.D. / Cardiology  CHIEF COMPLAINT:  Cardiac Arrest  HISTORY OF PRESENT ILLNESS:  Per the electronic medical record patient had a cardiac arrest at home. CPR was performed by family within 3 minutes of the arrest. A total of 16-20 minutes of CPR was performed before return of spontaneous circulation. Initial rhythm was ventricular fibrillation which deteriorated to a pulseless electrical activity. Patient was intubated in the field by EMS. In the emergency department the patient was noted to have spontaneous movement but was not following commands but reportedly difficult to tell if purposeful. Cardiology evaluated the patient and code STEMI was canceled. Family friend confirms she heard the patient fall prior to 8 PM this evening. She reports she was "gasping" intermittently for air. She had no complaints that she had voiced prior to this acute event. Of note the patient did fly to [facility] on May 13 and returned the same day. The patient also had a three-hour drive which they report they did take intermittent stops to get out of the car.   SUBJECTIVE: unable as she is intubated and sedated and on NMB  VITAL SIGNS: BP 94/68 mmHg  Pulse 63  Temp(Src) 98.8 F (37.1 C) (Core (Comment))  Resp 17  Ht 5\' 9"  (1.753 m)  Wt 93.21 kg (205 lb 7.9 oz)  BMI 30.33 kg/m2  SpO2 100%  HEMODYNAMICS:    VENTILATOR SETTINGS: Vent Mode:  [-] PRVC FiO2 (%):  [40 %-50 %] 40 % Set Rate:  [18 bmp-24 bmp] 24 bmp Vt Set:  [530 mL] 530 mL PEEP:  [5 cmH20-8 cmH20] 5 cmH20 Plateau Pressure:  [17 cmH20-23 cmH20] 18 cmH20  INTAKE / OUTPUT: I/O last 3 completed shifts: In: 7454.5 [I.V.:7304.5; NG/GT:50; IV Piggyback:100] Out: [id number] [Urine:3295; Other:250]  PHYSICAL EXAMINATION: General:  Overweight female in NAD  Integument:   Warm & dry. No rash on exposed skin. No bruising on exposed skin. HEENT:  Moist mucus membranes. No scleral injection or icterus. Endotracheal tube in place. Cardiovascular:  Regular rhythm. Sinus brady on telemetry. No edema.  Pulmonary:  Good aeration & clear to auscultation bilaterally. Abdomen: Soft. hypoactive bowel sounds. Nondistended.  Musculoskeletal:  Normal bulk and tone. No joint deformity or effusion appreciated. Neurological:  Pupils pinpoint symmetric & reactive.  LABS:  BMET  Recent Labs Lab 11/20/15 0425 11/20/15 2015 11/21/15 0300  NA 138 139 138  K 3.9 4.9 3.4*  CL 110 111 111  CO2 20* 22 19*  BUN 18 11 11   CREATININE 0.94 0.74 0.69  GLUCOSE 131* 106* 115*    Electrolytes  Recent Labs Lab 11/19/15 2335 11/20/15 0425 11/20/15 2015 11/21/15 0300  CALCIUM  --  7.5* 7.4* 7.6*  MG 1.9 1.9  --  1.9  PHOS  --  2.9  2.8  --  1.6*    CBC  Recent Labs Lab 11/19/15 2035 11/19/15 2041 11/20/15 0425 11/21/15 0300  WBC 11.2*  --  9.7 5.2  HGB 11.9* 13.6 10.6* 10.1*  HCT 37.8 40.0 33.2* 32.3*  PLT 367  --  320 228    Coag's  Recent Labs Lab 11/19/15 2035 11/20/15 0425 11/21/15 0450  APTT 24  --   --   INR 1.10 1.16 1.24    Sepsis Markers  Recent Labs Lab 11/20/15 0425 11/20/15 1300 11/20/15 2205  LATICACIDVEN  3.6* 1.2 1.8    ABG  Recent Labs Lab 11/20/15 0410 11/20/15 1440 11/21/15 0312  PHART 7.256* 7.157* 7.429  PCO2ART 46.0* 55.1* 27.4*  PO2ART 175.0* 119.0* 139*    Liver Enzymes  Recent Labs Lab 11/19/15 2035 11/20/15 0425  AST 202*  --   ALT 96*  --   ALKPHOS 55  --   BILITOT 0.3  --   ALBUMIN 3.8 3.1*    Cardiac Enzymes  Recent Labs Lab 11/20/15 1300 11/20/15 2015 11/21/15 0300  TROPONINI 25.78* 29.10* 45.60*  44.08*    Glucose  Recent Labs Lab 11/20/15 1138 11/20/15 1528 11/20/15 2013 11/21/15 11/21/15 0305 11/21/15 0803  GLUCAP 135* 121* 103* 118* 117* 112*    Imaging Dg Chest Port  1 View  11/21/2015  CLINICAL DATA:  Shortness of breath. EXAM: PORTABLE CHEST 1 VIEW COMPARISON:  11/20/2015.  CT 11/19/2015. FINDINGS: Endotracheal tube, NG tube, right IJ line stable position. Cardiomegaly. Increasing bilateral airspace disease and pleural effusions. Findings suggest possibly congestive heart failure. Bilateral pneumonia cannot be excluded. No pneumothorax . IMPRESSION: 1. Lines and tubes in stable position. 2. Cardiomegaly with progressive diffuse bilateral airspace disease and bilateral pleural effusions suggesting congestive heart failure. Bilateral pneumonia cannot be excluded. Electronically Signed   By: Marcello Moores  Register   On: 11/21/2015 07:33   Dg Chest Port 1 View  11/20/2015  CLINICAL DATA:  Central line placement, intubated EXAM: PORTABLE CHEST 1 VIEW COMPARISON:  11/19/2015 FINDINGS: Slight worsening in aeration. There is right upper lobe and left perihilar airspace disease suspicious for multifocal pneumonia. Mild bilateral basilar atelectasis or infiltrate. Stable endotracheal and NG tube position. There is right IJ central line with tip in SVC. No pneumothorax. IMPRESSION: Slight worsening in aeration. Right upper lobe and left perihilar airspace disease suspicious for bilateral pneumonia. Mild basilar atelectasis or infiltrate. Stable endotracheal and NG tube position. Right IJ central line in place. No pneumothorax. Electronically Signed   By: Lahoma Crocker M.D.   On: 11/20/2015 13:20     STUDIES:  PORT CXR 6/13:   endotracheal tube in good position. Enteric tube below the diaphragm. Pulmonary vascular congestion with bilateral interstitial prominence. No pleural effusion appreciated. No focal opacity.  CT Head/Neck 6/13> No evidence of traumatic intracranial injury or fracture. No evidence of acute fracture or subluxation along the cervical spine. Mild degenerative change along the cervical spine. CTA CHEST 6/13> No evidence of significant pulmonary embolus. Diffuse  airspace disease and consolidation in both lungs compatible with multifocal pneumonia or aspiration. EEG 6/14 > diffuse slowing LHC 6/14 > non-dominant LCX occlusion with collaterals thought to be adequate. No intervention. LVEF 40-45%  MICROBIOLOGY: MRSA PCR 6/13 >>  ANTIBIOTICS: Rocephin 6/14 >  SIGNIFICANT EVENTS: 6/13 - Admit w/ out of hospital arrest 6/14 - LHC, dyssynchrony on vent with NMB initiated  LINES/TUBES: OETT 7.5 6/13 >>  OGT 6/13 >> Foley 6/13 >> PIV x2  DISCUSSION:  65 year old female with out of hospital arrest. History of diabetes mellitus and hyperlipidemia. 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 Recently taken off of Xarelto this year. Patient is having some purposeful movements therefore deferring cooling at this time. Awaiting further results from workup with CT of the head/neck and CT angiogram of the chest. Ultimately underwent normothermia protocol. [place] indicated LCX occlusion with adequate collaterals. LVEF 40-45%. No intervention was performed. Due to vent asynchrony and poor ventilation she was initiated on neuromuscular blockade.  ASSESSMENT / PLAN:  PULMONARY A: Acute Hypoxic  Respiratory Failure - Post arrest w/ pulmonary edema on port cxr. 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 Occurred while on hormone therapy. PE ruled out on CTA chest 6/13 Pulmonary edema  P:   Full Vent Support SBT & WUA as mental status allows NMB for vent synchrony > turned off this AM as no twitches on TO4 Consider diuresis once pressors off/weaning better.   CARDIOVASCULAR A:  Cardiogenic shock S/P V Fib/PEA Arrest - At least 16 minutes before ROSC. NSTEMI > trop still rising. LCX occlusion on cath, no intervention.  Acute systolic CHF - LVEF [id number] on LHC H/O MVP H/O Hyperlipidemia  P:  Cardiology following Monitor on telemetry Heparin drip per pharmacy protocol MAP goal > 88mmHg norepinephrine for MAP  goal Holding home Fenofibrate & Zocor  RENAL A:   Lactic Acidosis > resolved Mild NAG acidosis  P:   Monitoring UOP with Foley Trending electrolytes & renal function daily Replacing electrolytes as indicated Stat Magnesium  GASTROINTESTINAL A:   No acute issues.  P:   NPO Pepcid IV daily  HEMATOLOGIC A:   Leukocytosis - Mild. Likely reactive. Anemia - Mild.  P:  Trending cell counts daily w/ CBC Heparin drip for therapeutic anticoagulation per pharmacy protocol  INFECTIOUS A:   Possible aspiration  P:   Monitor fever & leukocytosis Rocephin for aspiration  ENDOCRINE A:   Hyperglycemia H/O Hypothyroidism (TSH elevated but T4 ok)    P:   Checking Hgb A1c Accu-Checks q4hr SSI per Low Dose Algorithm Restart synthroid with IV dosing.  NEUROLOGIC A:   Sedation on Ventilator H/O Restless Leg Syndrome H/O Headaches  P:   RASS goal: 0 to -1 Fentanyl gtt Propofol gtt Holding Lexapro, Flexeril, Relpax, Requip, & MSIR. Will attempt to wean sedation today once NMB worn off.    FAMILY  - Updates: Sister and roommate/friend updated at bedside 6/15 by [facility]  - Inter-disciplinary family meet or Palliative Care meeting due by:  6/20  Critical care time performed by APP 35 mins  Georgann Housekeeper, AGACNP-BC [facility] Pulmonology/Critical Care Pager [phone] or [phone]  11/21/2015 11:24 AM   Need paralytic yesterday.    RASS -5.  Basilar crackles.  HR regular.  Abd soft.  CXR with b/l effusions and edema.  Hb 10.1, Creatinine 0.69, Troponin 45.6.  Assessment/plan:  Acute respiratory failure, hypoxia. Acute pulmonary edema. - full vent support - f/u CXR - lasix on 6/15 >> maintain MAP > 65 with levophed  VF/PEA arrest. Acute systolic CHF. NSTEMI. - heparin gtt  Aspiration pneumonia. - rocephin  Acute anoxic encephalopathy. - RASS goal -4 to -5 for now  Updated pts family at bedside.  CC time by me independent of APP time 32  minutes.  Chesley Mires, MD [facility] Pulmonary/Critical Care 11/21/2015, 12:22 PM Pager:  [phone] After 3pm call: [phone]

## 2015-11-21 NOTE — Progress Notes (Signed)
[facility] ICU Electrolyte Replacement Protocol  Patient Name: Brittany Mason DOB: 26-Dec-1950 MRN: [id number]  Date of Service  11/21/2015   HPI/Events of Note    Recent Labs Lab 11/19/15 2035 11/19/15 2041 11/19/15 2335 11/20/15 0425 11/20/15 2015 11/21/15 0300  NA 139 141  --  138 139 138  K 3.5 3.6  --  3.9 4.9 3.4*  CL 107 104  --  110 111 111  CO2 18*  --   --  20* 22 19*  GLUCOSE 229* 223*  --  131* 106* 115*  BUN 19 23*  --  18 11 11   CREATININE 1.32* 1.20*  --  0.94 0.74 0.69  CALCIUM 9.1  --   --  7.5* 7.4* 7.6*  MG  --   --  1.9 1.9  --  1.9  PHOS  --   --   --  2.9  2.8  --  1.6*    Estimated Creatinine Clearance: 87.2 mL/min (by C-G formula based on Cr of 0.69).  Intake/Output      06/14 0701 - 06/15 0700   I.V. (mL/kg) 3147.9 (34.9)   NG/GT 50   IV Piggyback 50   Total Intake(mL/kg) 3247.9 (36)   Urine (mL/kg/hr) 1820 (0.8)   Total Output 1820   Net +1427.9        - I/O DETAILED x24h    Total I/O In: 532.6 [I.V.:532.6] Out: 520 [Urine:520] - I/O THIS SHIFT    ASSESSMENT   eICURN Interventions  K+ replaced using electrolyte protocol   ASSESSMENT: MAJOR ELECTROLYTE    Lorene Dy 11/21/2015, 4:54 AM

## 2015-11-22 ENCOUNTER — Inpatient Hospital Stay (HOSPITAL_COMMUNITY): Payer: 59

## 2015-11-22 DIAGNOSIS — J9601 Acute respiratory failure with hypoxia: Secondary | ICD-10-CM | POA: Diagnosis present

## 2015-11-22 LAB — CBC
HEMATOCRIT: 34.2 % — AB (ref 36.0–46.0)
Hemoglobin: 10.8 g/dL — ABNORMAL LOW (ref 12.0–15.0)
MCH: 27.9 pg (ref 26.0–34.0)
MCHC: 31.6 g/dL (ref 30.0–36.0)
MCV: 88.4 fL (ref 78.0–100.0)
PLATELETS: 261 10*3/uL (ref 150–400)
RBC: 3.87 MIL/uL (ref 3.87–5.11)
RDW: 15.3 % (ref 11.5–15.5)
WBC: 11.6 10*3/uL — ABNORMAL HIGH (ref 4.0–10.5)

## 2015-11-22 LAB — BASIC METABOLIC PANEL
ANION GAP: 8 (ref 5–15)
BUN: 8 mg/dL (ref 6–20)
CHLORIDE: 108 mmol/L (ref 101–111)
CO2: 24 mmol/L (ref 22–32)
Calcium: 8.1 mg/dL — ABNORMAL LOW (ref 8.9–10.3)
Creatinine, Ser: 0.77 mg/dL (ref 0.44–1.00)
GFR calc Af Amer: >60 mL/min (ref >60)
GFR calc non Af Amer: >60 mL/min (ref >60)
GLUCOSE: 108 mg/dL — AB (ref 65–99)
POTASSIUM: 4.5 mmol/L (ref 3.5–5.1)
Sodium: 140 mmol/L (ref 135–145)

## 2015-11-22 LAB — BLOOD GAS, ARTERIAL
ACID-BASE DEFICIT: 3.1 mmol/L — AB (ref 0.0–2.0)
Bicarbonate: 22.7 mEq/L (ref 20.0–24.0)
Drawn by: 29017
FIO2: 0.4
LHR: 20 {breaths}/min
MECHVT: 530 mL
O2 SAT: 98.2 %
PATIENT TEMPERATURE: 98.6
PCO2 ART: 50.4 mmHg — AB (ref 35.0–45.0)
PEEP/CPAP: 5 cmH2O
PO2 ART: 143 mmHg — AB (ref 80.0–100.0)
TCO2: 24.3 mmol/L (ref 0–100)
pH, Arterial: 7.276 — ABNORMAL LOW (ref 7.350–7.450)

## 2015-11-22 LAB — GLUCOSE, CAPILLARY
GLUCOSE-CAPILLARY: 127 mg/dL — AB (ref 65–99)
GLUCOSE-CAPILLARY: 81 mg/dL (ref 65–99)
GLUCOSE-CAPILLARY: 95 mg/dL (ref 65–99)
GLUCOSE-CAPILLARY: 89 mg/dL (ref 65–99)
GLUCOSE-CAPILLARY: 87 mg/dL (ref 65–99)
Glucose-Capillary: 74 mg/dL (ref 65–99)
Glucose-Capillary: 77 mg/dL (ref 65–99)

## 2015-11-22 MED ORDER — FENTANYL CITRATE (PF) 100 MCG/2ML IJ SOLN
50.0000 ug | INTRAMUSCULAR | Status: DC | PRN
  Administered 2015-11-22 – 2015-11-23 (×3): 50 ug via INTRAVENOUS
  Filled 2015-11-22 (×3): qty 2

## 2015-11-22 MED ORDER — ONDANSETRON HCL 4 MG/2ML IJ SOLN
4.0000 mg | Freq: Four times a day (QID) | INTRAMUSCULAR | Status: DC | PRN
  Administered 2015-11-22: 4 mg via INTRAVENOUS

## 2015-11-22 MED ORDER — CETYLPYRIDINIUM CHLORIDE 0.05 % MT LIQD
7.0000 mL | Freq: Two times a day (BID) | OROMUCOSAL | Status: DC
  Administered 2015-11-23 (×3): 7 mL via OROMUCOSAL

## 2015-11-22 MED ORDER — ONDANSETRON HCL 4 MG/2ML IJ SOLN
INTRAMUSCULAR | Status: AC
  Administered 2015-11-22: 4 mg via INTRAVENOUS
  Filled 2015-11-22: qty 2

## 2015-11-22 NOTE — Progress Notes (Signed)
Initial Nutrition Assessment   INTERVENTION:  If unable to advance diet, recommend placing Cortrak NGT and Initiating Jevity 1.2 via NGT @ goal rate of 65 ml/hr (1560 ml per day) to provide 1872 kcals, 87 gm protein, 1310 ml free water daily.  If diet is advanced, recommend providing Ensure Enlive or other appropriate nutritional supplement until PO intake is determined to be adequate.   NUTRITION DIAGNOSIS:   Predicted suboptimal nutrient intake related to lethargy/confusion as evidenced by NPO status.   GOAL:   Patient will meet greater than or equal to 90% of their needs   MONITOR:   Diet advancement, Skin, Weight trends, Labs  REASON FOR ASSESSMENT:   Ventilator    ASSESSMENT:   Per the electronic medical record patient had a cardiac arrest at home. CPR was performed by family within 3 minutes of the arrest. A total of 16-20 minutes of CPR was performed before return of spontaneous circulation. Initial rhythm was ventricular fibrillation which deteriorated to a pulseless electrical activity. Patient was intubated in the field by EMS.  RD drawn to patient chart due to ventilator status. Pt was extubated just prior to RD visit. Per RN, pt had episode of emesis due to gagging prior to extubation. Pt sleeping at time of visit. Per RN, pt is not yet ready for swallow evaluation; it's possible that patient may be started on tube feedings tomorrow.   Labs: low hemoglobin, low calcium  Diet Order:  Diet NPO time specified  Skin:  Reviewed, no issues  Last BM:  unknown  Height:   Ht Readings from Last 1 Encounters:  11/20/15 5\' 9"  (1.753 m)    Weight:   Wt Readings from Last 1 Encounters:  11/22/15 204 lb 5.9 oz (92.7 kg)    Ideal Body Weight:  65.9 kg  BMI:  Body mass index is 30.17 kg/(m^2).  Estimated Nutritional Needs:   Kcal:  [id number]  Protein:  80-95 grams  Fluid:  2.5 L/day  EDUCATION NEEDS:   No education needs identified at this time  [place], LDN Inpatient Clinical Dietitian Pager: [phone] After Hours Pager: [phone]

## 2015-11-22 NOTE — Clinical Documentation Improvement (Addendum)
Critical Care Medicine and/or Cardiology  Please document query responses in the progress notes and discharge summary, not on the CDI BPA from in [facility].  Please document if a condition below provides greater specificity regarding the patient's renal function this admission:  - Acute Renal Failure, reviewed  - Other condition  - Unable to clinically determine  Clinical Information: BUN/Creat on admission  19/1.32 BUN/Creat improved by 0.58 mg/dL within 24 hours of admission Bun/Creat improved by 0.63 mg/dL within 48 hours of admission Patient found down at home. Cardiogenic shock is documented this admission  BUN/Creat/GFR trend this admission       (white female) Component     Latest Ref Rng 11/19/2015 11/20/2015 11/21/2015 11/22/2015  BUN     6 - 20 mg/dL 19 11 11 8   Creatinine     0.44 - 1.00 mg/dL 1.32 (H) 0.74 0.69 0.77  EGFR (Non-African Amer.)     >60 mL/min 42 (L) >60 >60 >60    Please exercise your independent, professional judgment when responding. A specific answer is not anticipated or expected.  Thank You, Erling Conte  RN BSN CCDS [phone] Health Information Management [place]

## 2015-11-22 NOTE — Procedures (Signed)
Extubation Procedure Note  Patient Details:   Name: Brittany Mason DOB: [birth year] MRN: [id number]   Airway Documentation:     Evaluation  O2 sats: stable throughout Complications: No apparent complications Patient did tolerate procedure well. Bilateral Breath Sounds: Rhonchi   No  PT was extubated to 4L [place]  PT was not verbal  Sats are stable  RT to monitor   Charlestine Rookstool, Leonie Douglas 11/22/2015, 11:05 AM

## 2015-11-22 NOTE — Progress Notes (Signed)
   11/22/15 1500  Clinical Encounter Type  Visited With Family  Visit Type Follow-up  Referral From Chaplain  Consult/Referral To Chaplain  Spiritual Encounters  Spiritual Needs Prayer;Emotional  CHP visited with family/friends in waiting room. All were very encourage and noted prayers were working. CHP celebrated with family about measured progress and assured prayers would continue.  CHP available for follow-up as needed. Roe Coombs 11/22/2015

## 2015-11-22 NOTE — Consult Note (Addendum)
   [facility] Inpatient Consult   11/22/2015  Brittany Mason [birth year] [id number]   Patient screened for Link to [facility] Care Management program for [employer] employees/dependents with [facility] insurance. Chart reviewed. Noted just recently extubated. Will visit at a more appropriate time.   Marthenia Rolling, MSN-Ed, RN,BSN [facility] Liaison [phone]

## 2015-11-22 NOTE — Progress Notes (Signed)
PULMONARY / CRITICAL CARE MEDICINE   Name: Brittany Mason MRN: [id number] DOB: Oct 29, 1950    ADMISSION DATE:  11/19/2015 CONSULTATION DATE:  11/19/2015  REFERRING MD:  Daneen Schick, M.D. / Cardiology  CHIEF COMPLAINT:  Cardiac Arrest  HISTORY OF PRESENT ILLNESS:  Per the electronic medical record patient had a cardiac arrest at home. CPR was performed by family within 3 minutes of the arrest. A total of 16-20 minutes of CPR was performed before return of spontaneous circulation. Initial rhythm was ventricular fibrillation which deteriorated to a pulseless electrical activity. Patient was intubated in the field by EMS. In the emergency department the patient was noted to have spontaneous movement but was not following commands but reportedly difficult to tell if purposeful. Cardiology evaluated the patient and code STEMI was canceled. Family friend confirms she heard the patient fall prior to 8 PM this evening. She reports she was "gasping" intermittently for air. She had no complaints that she had voiced prior to this acute event. Of note the patient did fly to [facility] on May 13 and returned the same day. The patient also had a three-hour drive which they report they did take intermittent stops to get out of the car.   SUBJECTIVE:  More awake. No distress on SBT  VITAL SIGNS: BP 109/71 mmHg  Pulse 114  Temp(Src) 100 F (37.8 C) (Core (Comment))  Resp 17  Ht 5\' 9"  (1.753 m)  Wt 204 lb 5.9 oz (92.7 kg)  BMI 30.17 kg/m2  SpO2 96%  HEMODYNAMICS:    VENTILATOR SETTINGS: Vent Mode:  [-] PRVC FiO2 (%):  [40 %] 40 % Set Rate:  [20 bmp-24 bmp] 20 bmp Vt Set:  [530 mL] 530 mL PEEP:  [5 cmH20] 5 cmH20 Plateau Pressure:  [12 cmH20-28 cmH20] 12 cmH20  INTAKE / OUTPUT:  Intake/Output Summary (Last 24 hours) at 11/22/15 1019 Last data filed at 11/22/15 0800  Gross per 24 hour  Intake 791.42 ml  Output   3865 ml  Net -3073.58 ml    PHYSICAL EXAMINATION: General:  Overweight female in  NAD, looks comfortable on SBT Integument:  Warm & dry. No rash on exposed skin. No bruising on exposed skin. HEENT:  Moist mucus membranes. No scleral injection or icterus. Endotracheal tube in place. Cardiovascular:  Regular rhythm. Sinus brady on telemetry. No edema.  Pulmonary:  Good aeration & clear to auscultation bilaterally. Equal rise. F/Vt excellent  Abdomen: Soft. hypoactive bowel sounds. Nondistended.  Musculoskeletal:  Normal bulk and tone. No joint deformity or effusion appreciated. Neurological:  Pupils pinpoint symmetric & reactive.  LABS:  BMET  Recent Labs Lab 11/20/15 2015 11/21/15 0300 11/22/15 0330  NA 139 138 140  K 4.9 3.4* 4.5  CL 111 111 108  CO2 22 19* 24  BUN 11 11 8   CREATININE 0.74 0.69 0.77  GLUCOSE 106* 115* 108*    Electrolytes  Recent Labs Lab 11/19/15 2335 11/20/15 0425 11/20/15 2015 11/21/15 0300 11/22/15 0330  CALCIUM  --  7.5* 7.4* 7.6* 8.1*  MG 1.9 1.9  --  1.9  --   PHOS  --  2.9  2.8  --  1.6*  --     CBC  Recent Labs Lab 11/20/15 0425 11/21/15 0300 11/22/15 0330  WBC 9.7 5.2 11.6*  HGB 10.6* 10.1* 10.8*  HCT 33.2* 32.3* 34.2*  PLT 320 228 261    Coag's  Recent Labs Lab 11/19/15 2035 11/20/15 0425 11/21/15 0450  APTT 24  --   --  INR 1.10 1.16 1.24    Sepsis Markers  Recent Labs Lab 11/20/15 0425 11/20/15 1300 11/20/15 2205  LATICACIDVEN 3.6* 1.2 1.8    ABG  Recent Labs Lab 11/20/15 1440 11/21/15 0312 11/22/15 0405  PHART 7.157* 7.429 7.276*  PCO2ART 55.1* 27.4* 50.4*  PO2ART 119.0* 139* 143*    Liver Enzymes  Recent Labs Lab 11/19/15 2035 11/20/15 0425  AST 202*  --   ALT 96*  --   ALKPHOS 55  --   BILITOT 0.3  --   ALBUMIN 3.8 3.1*    Cardiac Enzymes  Recent Labs Lab 11/20/15 1300 11/20/15 2015 11/21/15 0300  TROPONINI 25.78* 29.10* 45.60*  44.08*    Glucose  Recent Labs Lab 11/21/15 1650 11/21/15 2104 11/21/15 2224 11/22/15 0002 11/22/15 0440  11/22/15 0809  GLUCAP 98 69 127* 74 81 77    Imaging Dg Chest Port 1 View  11/22/2015  CLINICAL DATA:  Intubation. EXAM: PORTABLE CHEST 1 VIEW COMPARISON:  11/21/2015. FINDINGS: Endotracheal tube, NG tube, right IJ line stable position. Mild cardiomegaly. Pulmonary venous congestion. Diffuse bilateral pulmonary infiltrates most consistent pulmonary edema. Slight interim clearing from prior exam. No pleural effusion or pneumothorax. IMPRESSION: 1. Lines and tubes in stable position. 2. Persistent cardiomegaly mild pulmonary venous congestion. Bilateral pulmonary infiltrates again noted, slightly improved prior exam. Findings consistent with slight improvement of congestive heart failure. Electronically Signed   By: Marcello Moores  Register   On: 11/22/2015 07:03  vascular congestion but remarkable improvement w/ lasix.   STUDIES:  PORT CXR 6/13:   endotracheal tube in good position. Enteric tube below the diaphragm. Pulmonary vascular congestion with bilateral interstitial prominence. No pleural effusion appreciated. No focal opacity.  CT Head/Neck 6/13> No evidence of traumatic intracranial injury or fracture. No evidence of acute fracture or subluxation along the cervical spine. Mild degenerative change along the cervical spine. CTA CHEST 6/13> No evidence of significant pulmonary embolus. Diffuse airspace disease and consolidation in both lungs compatible with multifocal pneumonia or aspiration. EEG 6/14 > diffuse slowing LHC 6/14 > non-dominant LCX occlusion with collaterals thought to be adequate. No intervention. LVEF 40-45%  MICROBIOLOGY: MRSA PCR 6/13 >>  ANTIBIOTICS: Rocephin 6/14 >  SIGNIFICANT EVENTS: 6/13 - Admit w/ out of hospital arrest 6/14 - LHC, dyssynchrony on vent with NMB initiated  LINES/TUBES: OETT 7.5 6/13 >>6/16 OGT 6/13 >> Foley 6/13 >> PIV x2  DISCUSSION:  65 year old female with out of hospital arrest. History of diabetes mellitus and hyperlipidemia. 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 Recently taken off of Xarelto this year.  [place] indicated LCX occlusion with adequate collaterals. LVEF 40-45%. No intervention was performed. Now s/p normothermia protocol. Course c/b presumed aspiration but may have been more edema than aspiration given remarkable improvement in CXR today after lasix. Ready for extubation. Major issue at this point will be her mental status and what degree of functional recovery she will have.   ASSESSMENT / PLAN:  PULMONARY A: Acute Hypoxic Respiratory Failure - Post arrest w/ pulmonary edema on port cxr. 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 Occurred while on hormone therapy. PE ruled out on CTA chest 6/13 Pulmonary edema-->much improved after lasix  Passed SBT P:   Extubate Aspiration precautions Wean O2 Mobilize   CARDIOVASCULAR A:  Cardiogenic shock S/P V Fib/PEA Arrest - At least 16 minutes before ROSC. NSTEMI > trop still rising. LCX occlusion on cath, no intervention.  Acute systolic CHF - LVEF  40-45% on LHC H/O MVP H/O Hyperlipidemia  P:  Cardiology following Monitor on telemetry Heparin drip per pharmacy protocol MAP goal > 8mmHg Holding home Fenofibrate & Zocor  RENAL A:   Lactic Acidosis > resolved Mild NAG acidosis-->resolved  P:   Monitoring UOP with Foley Trending electrolytes & renal function daily Replacing electrolytes as indicated  GASTROINTESTINAL A:   No acute issues.  P:   Start tubefeeds in next 24 hrs if MS won't support diet  Pepcid IV daily  HEMATOLOGIC A:   Leukocytosis - Mild. Likely reactive; but rising Anemia - Mild.  P:  Trending cell counts daily w/ CBC Heparin drip for therapeutic anticoagulation per pharmacy protocol  INFECTIOUS A:   Possible aspiration  P:   Monitor fever & leukocytosis Rocephin for aspiration  ENDOCRINE A:   Hyperglycemia H/O Hypothyroidism (TSH elevated but T4 ok)    P:    Accu-Checks q4hr SSI per Low Dose Algorithm Cont IV synthroid for now   NEUROLOGIC A:   Sedation on Ventilator r/o encephalopathy  H/O Restless Leg Syndrome H/O Headaches  P:   RASS goal: 0  Dc sedation Hold lexapro, Flexeril, Relpax, Requip, & MSIR.    FAMILY  - Updates: Sister and roommate/friend updated at bedside 6/15 by [place] family meet or Palliative Care meeting due by:  6/20   Erick Colace ACNP-BC [place] Pager # [phone] OR # [phone] if no answer  More alert, but not following commands.  Has good cough and gag.  Tolerated SBT >> extubated.  Non verbal after extubation.  Better air movement with scattered crackles.  HR regular.  Abd soft.  Assessment/plan:  Acute respiratory failure, hypoxia. - extubated  NSTEMI. Acute pulmonary edema. Acute systolic CHF. - per cardiology  Aspiration pneumonia. - rocephin  Anoxic encephalopathy >> some improvement. - monitor mental status after extubation  Updated pt's family at bedside.  CC time by me independent of APP time 32 minutes.  Chesley Mires, MD [facility] Pulmonary/Critical Care 11/22/2015, 11:14 AM Pager:  [phone] After 3pm call: [phone]

## 2015-11-22 NOTE — Progress Notes (Signed)
Pt asynchronous with the ventilator. MD notified. To continue titrating fentanyl. No new orders.

## 2015-11-22 NOTE — Progress Notes (Signed)
MD notified regarding abnormal ABG, Ca 8.1, and WBC 1.6. Increased from 5.2 24 hours previous. No new orders at this time.

## 2015-11-22 NOTE — Progress Notes (Signed)
Patient water temps on [employer] machine continue to remain low most of the time. Patient is extubated and mostly alert and appears agitated by pads/water temp. MD notified and [place] sun pads removed. Patient became more restful.

## 2015-11-22 NOTE — Progress Notes (Signed)
   11/22/15 1000  Clinical Encounter Type  Visited With Patient and family together  Visit Type Follow-up  Referral From Chaplain  Consult/Referral To [place] Prayer;Emotional  [facility] followed up from 11/21/15 visit, prayed with family and provided emotional support. Roe Coombs 11/22/2015

## 2015-11-22 NOTE — Progress Notes (Signed)
The patient is sedated on the ventilator. The cooling protocol is being weaned. Pressors have been discontinued. Cardiac status is stable at this time. We will continue to follow, hoping that her mental status will improve.  Daryel November, MD

## 2015-11-23 ENCOUNTER — Other Ambulatory Visit: Payer: Self-pay

## 2015-11-23 DIAGNOSIS — I214 Non-ST elevation (NSTEMI) myocardial infarction: Secondary | ICD-10-CM

## 2015-11-23 LAB — GLUCOSE, CAPILLARY
GLUCOSE-CAPILLARY: 81 mg/dL (ref 65–99)
GLUCOSE-CAPILLARY: 90 mg/dL (ref 65–99)
GLUCOSE-CAPILLARY: 93 mg/dL (ref 65–99)
Glucose-Capillary: 91 mg/dL (ref 65–99)
Glucose-Capillary: 93 mg/dL (ref 65–99)
Glucose-Capillary: 107 mg/dL — ABNORMAL HIGH (ref 65–99)

## 2015-11-23 LAB — CBC
HCT: 30.4 % — ABNORMAL LOW (ref 36.0–46.0)
HEMOGLOBIN: 9.5 g/dL — AB (ref 12.0–15.0)
MCH: 27.1 pg (ref 26.0–34.0)
MCHC: 31.3 g/dL (ref 30.0–36.0)
MCV: 86.9 fL (ref 78.0–100.0)
Platelets: 255 10*3/uL (ref 150–400)
RBC: 3.5 MIL/uL — AB (ref 3.87–5.11)
RDW: 14.9 % (ref 11.5–15.5)
WBC: 8 10*3/uL (ref 4.0–10.5)

## 2015-11-23 LAB — BASIC METABOLIC PANEL
ANION GAP: 11 (ref 5–15)
BUN: 15 mg/dL (ref 6–20)
CALCIUM: 8.2 mg/dL — AB (ref 8.9–10.3)
CHLORIDE: 101 mmol/L (ref 101–111)
CO2: 29 mmol/L (ref 22–32)
Creatinine, Ser: 0.79 mg/dL (ref 0.44–1.00)
GFR calc non Af Amer: >60 mL/min (ref >60)
Glucose, Bld: 101 mg/dL — ABNORMAL HIGH (ref 65–99)
Potassium: 3.2 mmol/L — ABNORMAL LOW (ref 3.5–5.1)
Sodium: 141 mmol/L (ref 135–145)

## 2015-11-23 MED ORDER — ROPINIROLE HCL 1 MG PO TABS
0.5000 mg | ORAL_TABLET | Freq: Every day | ORAL | Status: DC
  Administered 2015-11-24: 0.5 mg via ORAL
  Filled 2015-11-23: qty 1

## 2015-11-23 MED ORDER — INSULIN ASPART 100 UNIT/ML ~~LOC~~ SOLN: 0.0000 [IU] | Freq: Three times a day (TID) | SUBCUTANEOUS | Status: DC

## 2015-11-23 MED ORDER — METOPROLOL SUCCINATE ER 25 MG PO TB24
25.0000 mg | ORAL_TABLET | Freq: Two times a day (BID) | ORAL | Status: DC
  Administered 2015-11-23 – 2015-11-25 (×4): 25 mg via ORAL
  Filled 2015-11-23 (×4): qty 1

## 2015-11-23 NOTE — Progress Notes (Signed)
Patient and family informed ropinirole was ordered for 2200 for restless legs. Patient's sister, Lelan Pons stated that she gave the pt ropinirole. This RN informed the family that they should not have given the medication.

## 2015-11-23 NOTE — Progress Notes (Signed)
PULMONARY / CRITICAL CARE MEDICINE   Name: Brittany Mason MRN: [id number] DOB: 05-22-1951    ADMISSION DATE:  11/19/2015 CONSULTATION DATE:  11/19/2015  REFERRING MD:  Daneen Schick, M.D. / Cardiology  CHIEF COMPLAINT:  Cardiac Arrest  HISTORY OF PRESENT ILLNESS:  Per the electronic medical record patient had a cardiac arrest at home. CPR was performed by family within 3 minutes of the arrest. A total of 16-20 minutes of CPR was performed before return of spontaneous circulation. Initial rhythm was ventricular fibrillation which deteriorated to a pulseless electrical activity. Patient was intubated in the field by EMS. In the emergency department the patient was noted to have spontaneous movement but was not following commands but reportedly difficult to tell if purposeful. Cardiology evaluated the patient and code STEMI was canceled. Family friend confirms she heard the patient fall prior to 8 PM this evening. She reports she was "gasping" intermittently for air. She had no complaints that she had voiced prior to this acute event. Of note the patient did fly to [facility] on May 13 and returned the same day. The patient also had a three-hour drive which they report they did take intermittent stops to get out of the car.  SUBJECTIVE:  Extubated and doing well.  No events overnight.  VITAL SIGNS: BP 102/65 mmHg  Pulse 96  Temp(Src) 100.9 F (38.3 C) (Oral)  Resp 11  Ht 5\' 9"  (1.753 m)  Wt 88.4 kg (194 lb 14.2 oz)  BMI 28.77 kg/m2  SpO2 96%  HEMODYNAMICS:    VENTILATOR SETTINGS:    INTAKE / OUTPUT:  Intake/Output Summary (Last 24 hours) at 11/23/15 1131 Last data filed at 11/23/15 0900  Gross per 24 hour  Intake    330 ml  Output   3320 ml  Net  -2990 ml    PHYSICAL EXAMINATION: General:  Overweight female in NAD, comfortable on [place]. Integument:  Warm & dry. No rash on exposed skin. No bruising on exposed skin. HEENT:  Moist mucus membranes. No scleral injection or icterus.   Cardiovascular:  Regular rhythm. Sinus brady on telemetry. No edema.  Pulmonary:  Good aeration & clear to auscultation bilaterally. Equal rise. F/Vt excellent. Abdomen: Soft. hypoactive bowel sounds. Nondistended.  Musculoskeletal:  Normal bulk and tone. No joint deformity or effusion appreciated. Neurological:  Pupils pinpoint symmetric & reactive.  LABS:  BMET  Recent Labs Lab 11/20/15 2015 11/21/15 0300 11/22/15 0330  NA 139 138 140  K 4.9 3.4* 4.5  CL 111 111 108  CO2 22 19* 24  BUN 11 11 8   CREATININE 0.74 0.69 0.77  GLUCOSE 106* 115* 108*   Electrolytes  Recent Labs Lab 11/19/15 2335 11/20/15 0425 11/20/15 2015 11/21/15 0300 11/22/15 0330  CALCIUM  --  7.5* 7.4* 7.6* 8.1*  MG 1.9 1.9  --  1.9  --   PHOS  --  2.9  2.8  --  1.6*  --    CBC  Recent Labs Lab 11/20/15 0425 11/21/15 0300 11/22/15 0330  WBC 9.7 5.2 11.6*  HGB 10.6* 10.1* 10.8*  HCT 33.2* 32.3* 34.2*  PLT 320 228 261   Coag's  Recent Labs Lab 11/19/15 2035 11/20/15 0425 11/21/15 0450  APTT 24  --   --   INR 1.10 1.16 1.24   Sepsis Markers  Recent Labs Lab 11/20/15 0425 11/20/15 1300 11/20/15 2205  LATICACIDVEN 3.6* 1.2 1.8   ABG  Recent Labs Lab 11/20/15 1440 11/21/15 0312 11/22/15 0405  PHART 7.157* 7.429 7.276*  PCO2ART 55.1* 27.4* 50.4*  PO2ART 119.0* 139* 143*    Liver Enzymes  Recent Labs Lab 11/19/15 2035 11/20/15 0425  AST 202*  --   ALT 96*  --   ALKPHOS 55  --   BILITOT 0.3  --   ALBUMIN 3.8 3.1*    Cardiac Enzymes  Recent Labs Lab 11/20/15 1300 11/20/15 2015 11/21/15 0300  TROPONINI 25.78* 29.10* 45.60*  44.08*   Glucose  Recent Labs Lab 11/22/15 1214 11/22/15 1636 11/22/15 2037 11/23/15 0031 11/23/15 0441 11/23/15 0755  GLUCAP 95 89 87 81 93 91   Imaging No results found.vascular congestion but remarkable improvement w/ lasix.   STUDIES:  PORT CXR 6/13:   endotracheal tube in good position. Enteric tube below the  diaphragm. Pulmonary vascular congestion with bilateral interstitial prominence. No pleural effusion appreciated. No focal opacity.  CT Head/Neck 6/13> No evidence of traumatic intracranial injury or fracture. No evidence of acute fracture or subluxation along the cervical spine. Mild degenerative change along the cervical spine. CTA CHEST 6/13> No evidence of significant pulmonary embolus. Diffuse airspace disease and consolidation in both lungs compatible with multifocal pneumonia or aspiration. EEG 6/14 > diffuse slowing LHC 6/14 > non-dominant LCX occlusion with collaterals thought to be adequate. No intervention. LVEF 40-45%  MICROBIOLOGY: MRSA PCR 6/13 >>  ANTIBIOTICS: Rocephin 6/14 >  SIGNIFICANT EVENTS: 6/13 - Admit w/ out of hospital arrest 6/14 - LHC, dyssynchrony on vent with NMB initiated  LINES/TUBES: OETT 7.5 6/13 >>6/16 OGT 6/13 >> Foley 6/13 >> PIV x2  I reviewed CXR myself, improved aeration.  DISCUSSION:  65 year old female with out of hospital arrest. History of diabetes mellitus and hyperlipidemia. 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 Recently taken off of Xarelto this year.  [place] indicated LCX occlusion with adequate collaterals. LVEF 40-45%. No intervention was performed. Now s/p normothermia protocol. Course c/b presumed aspiration but may have been more edema than aspiration given remarkable improvement in CXR today after lasix. Ready for extubation. Major issue at this point will be her mental status and what degree of functional recovery she will have.   ASSESSMENT / PLAN:  PULMONARY A: Acute Hypoxic Respiratory Failure - Post arrest w/ pulmonary edema on port cxr. 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 Occurred while on hormone therapy. PE ruled out on CTA chest 6/13 Pulmonary edema-->much improved after lasix  Passed SBT P:   Titrate O2 for sat of 88-92%. Aspiration precautions Wean O2 Mobilize   Continue diureses.  CARDIOVASCULAR A:  Cardiogenic shock S/P V Fib/PEA Arrest - At least 16 minutes before ROSC. NSTEMI > trop still rising. LCX occlusion on cath, no intervention.  Acute systolic CHF - LVEF [id number] on LHC H/O MVP H/O Hyperlipidemia  P:  Cardiology following Monitor on telemetry Heparin drip per pharmacy protocol MAP goal > 19mmHg Restart home Zocor.  RENAL A:   Lactic Acidosis > resolved Mild NAG acidosis-->resolved  P:   Monitoring UOP with Foley Trending electrolytes & renal function daily Replacing electrolytes as indicated  GASTROINTESTINAL A:   No acute issues.  P:   Heart healthy diet. Pepcid IV daily  HEMATOLOGIC A:   Leukocytosis - Mild. Likely reactive; but rising Anemia - Mild.  P:  Trending cell counts daily w/ CBC Heparin drip for therapeutic anticoagulation per pharmacy protocol  INFECTIOUS A:   Possible aspiration  P:   Monitor fever & leukocytosis Rocephin for aspiration, finish an 8 day course.  ENDOCRINE A:   Hyperglycemia H/O Hypothyroidism (TSH elevated but T4 ok)    P:   Accu-Checks q4hr SSI per Low Dose Algorithm Cont IV synthroid for now   NEUROLOGIC A:   Sedation on Ventilator r/o encephalopathy  H/O Restless Leg Syndrome H/O Headaches  P:   RASS goal: 0  Dc sedation Hold lexapro, Flexeril, Relpax, Requip, & MSIR.  Discussed with bedside RN, D/C foley and central line, restart home medications and transfer to SDU and to [facility] service, with PCCM off 6/18.  Discussed with TRH-MD.  Rush Farmer, M.D. [facility] Pulmonary/Critical Care Medicine. Pager: [phone]. After hours pager: [phone].

## 2015-11-23 NOTE — Progress Notes (Signed)
Subjective:  Patient was extubated yesterday and is currently lying in bed is awake and is oriented to place but does not remember what happened to her.  She is able to follow commands and is conversant.  She is able to tell me that she works in [employer].  No complaints of chest pain or shortness of breath.  Objective:  Vital Signs in the last 24 hours: BP 102/65 mmHg  Pulse 96  Temp(Src) 99 F (37.2 C) (Oral)  Resp 11  Ht 5\' 9"  (1.753 m)  Wt 88.4 kg (194 lb 14.2 oz)  BMI 28.77 kg/m2  SpO2 96%  Physical Exam: Pleasant white female mildly obese lying in bed in no acute distress Lungs:  Clear Cardiac:  Regular rhythm, normal S1 and S2, no S3 Extremities:  No edema present  Intake/Output from previous day: 06/16 0701 - 06/17 0700 In: 489.4 [I.V.:339.4; IV Piggyback:150] Out: [id number] [Urine:3455]  Weight Filed Weights   11/21/15 0500 11/22/15 0300 11/23/15 0500  Weight: 93.21 kg (205 lb 7.9 oz) 92.7 kg (204 lb 5.9 oz) 88.4 kg (194 lb 14.2 oz)    Lab Results: Basic Metabolic Panel:  Recent Labs  11/21/15 0300 11/22/15 0330  NA 138 140  K 3.4* 4.5  CL 111 108  CO2 19* 24  GLUCOSE 115* 108*  BUN 11 8  CREATININE 0.69 0.77   CBC:  Recent Labs  11/21/15 0300 11/22/15 0330  WBC 5.2 11.6*  HGB 10.1* 10.8*  HCT 32.3* 34.2*  MCV 87.3 88.4  PLT 228 261   Cardiac Panel (last 3 results)  Recent Labs  11/20/15 1300 11/20/15 2015 11/21/15 0300  TROPONINI 25.78* 29.10* 45.60*  44.08*    Telemetry: Sinus rhythm  Assessment/Plan:  1.  Acute lateral myocardial infarction with cardiac arrest 2.  Out of hospital cardiac arrest with previous hypothermia protocol 3.  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  Anemia   Recommendations:  She has maintained sinus rhythm.  Has a fairly good neurologic recovery at this point.  We'll repeat EKG, advance activity and advance diet.  Need  to initiate beta blockers and cardiac rehabilitation.  Kerry Hough  MD [facility] Cardiology  11/23/2015, 11:46 AM

## 2015-11-24 DIAGNOSIS — F329 Major depressive disorder, single episode, unspecified: Secondary | ICD-10-CM

## 2015-11-24 DIAGNOSIS — R531 Weakness: Secondary | ICD-10-CM

## 2015-11-24 DIAGNOSIS — I5021 Acute systolic (congestive) heart failure: Secondary | ICD-10-CM

## 2015-11-24 LAB — BASIC METABOLIC PANEL
Anion gap: 9 (ref 5–15)
BUN: 14 mg/dL (ref 6–20)
CALCIUM: 8.1 mg/dL — AB (ref 8.9–10.3)
CO2: 32 mmol/L (ref 22–32)
CREATININE: 0.73 mg/dL (ref 0.44–1.00)
Chloride: 99 mmol/L — ABNORMAL LOW (ref 101–111)
GFR calc non Af Amer: >60 mL/min (ref >60)
Glucose, Bld: 101 mg/dL — ABNORMAL HIGH (ref 65–99)
Potassium: 3 mmol/L — ABNORMAL LOW (ref 3.5–5.1)
SODIUM: 140 mmol/L (ref 135–145)

## 2015-11-24 LAB — GLUCOSE, CAPILLARY
GLUCOSE-CAPILLARY: 112 mg/dL — AB (ref 65–99)
Glucose-Capillary: 95 mg/dL (ref 65–99)
Glucose-Capillary: 116 mg/dL — ABNORMAL HIGH (ref 65–99)

## 2015-11-24 MED ORDER — POTASSIUM CHLORIDE CRYS ER 20 MEQ PO TBCR
40.0000 meq | EXTENDED_RELEASE_TABLET | Freq: Every day | ORAL | Status: DC
  Administered 2015-11-25: 40 meq via ORAL
  Filled 2015-11-24: qty 2

## 2015-11-24 MED ORDER — ESCITALOPRAM OXALATE 20 MG PO TABS
20.0000 mg | ORAL_TABLET | Freq: Every day | ORAL | Status: DC
  Administered 2015-11-24 – 2015-11-25 (×2): 20 mg via ORAL
  Filled 2015-11-24: qty 1
  Filled 2015-11-24: qty 2

## 2015-11-24 MED ORDER — LEVOTHYROXINE SODIUM 75 MCG PO TABS
150.0000 ug | ORAL_TABLET | Freq: Every day | ORAL | Status: DC
  Administered 2015-11-25: 150 ug via ORAL
  Filled 2015-11-24: qty 2

## 2015-11-24 MED ORDER — CLOPIDOGREL BISULFATE 75 MG PO TABS
75.0000 mg | ORAL_TABLET | Freq: Every day | ORAL | Status: DC
  Administered 2015-11-24 – 2015-11-25 (×2): 75 mg via ORAL
  Filled 2015-11-24 (×2): qty 1

## 2015-11-24 MED ORDER — POTASSIUM CHLORIDE CRYS ER 20 MEQ PO TBCR
40.0000 meq | EXTENDED_RELEASE_TABLET | ORAL | Status: AC
  Administered 2015-11-24 (×2): 40 meq via ORAL
  Filled 2015-11-24 (×2): qty 2

## 2015-11-24 MED ORDER — FUROSEMIDE 40 MG PO TABS
40.0000 mg | ORAL_TABLET | Freq: Every day | ORAL | Status: DC
  Administered 2015-11-25: 40 mg via ORAL
  Filled 2015-11-24: qty 1

## 2015-11-24 MED ORDER — AMOXICILLIN-POT CLAVULANATE 875-125 MG PO TABS
1.0000 | ORAL_TABLET | Freq: Two times a day (BID) | ORAL | Status: DC
  Administered 2015-11-24 – 2015-11-25 (×3): 1 via ORAL
  Filled 2015-11-24 (×4): qty 1

## 2015-11-24 NOTE — Progress Notes (Addendum)
PROGRESS NOTE  Brittany Mason  [id number] DOB: Jan 14, 1951 DOA: 11/19/2015 PCP: Gennette Pac, MD  Brief Narrative:   The patient is a 65 year old female with history of pulmonary embolism on chronic anticoagulation, mitral valve prolapse, hypothyroidism, hypercholesterolemia, and restless leg syndrome who presented with cardiac arrest at home.  CPR was performed by family within 3 minutes of the arrest. A total of 16-20 minutes of CPR was performed before return of spontaneous circulation. Initial rhythm was ventricular fibrillation which deteriorated to a pulseless electrical activity. Patient was intubated in the field by EMS. In the emergency department the patient was noted to have spontaneous movement but was not following commands but reportedly difficult to tell if purposeful.  She underwent [place] sun protocol.  Cardiac catheterization on 6/14 demonstrated a 45% stenosed proximal RCA to distal RCA lesion, a 65% stenosis of the RPDA, and a 107 stenosis of the proximal circumflex to mid circumflex with "meager" collaterals.  No intervention was performed.  She was placed on plavix.  She was extubated on 6/16 and transitioned to the stepdown unit on 6/18.  Awaiting PT/OT and probable SNF placement.    Assessment & Plan:   Active Problems:   Cardiac arrest [facility])   Cardiogenic shock (HCC)   NSTEMI (non-ST elevated myocardial infarction) ([place])   Acute respiratory failure with hypoxia (HCC)   Cardiogenic shock S/P V Fib/PEA Arrest - At least 16 minutes before ROSC secondary to NSTEMI (CAD) -  Continue aspirin.  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 Occurred while on hormone therapy. -  A/c discontinued earlier this year, no longer on hormone replacement -  PE ruled out on CTA chest 6/13  Lactic Acidosis > resolved Mild NAG acidosis-->resolved AKI due to cardiogenic shock > resolved  Possible aspiration with leukocytosis and low grade fever -  D/c ceftriaxone and start augmentin  Hyperglycemia due to stress, A1c 5.7, resolved.  -  D/c CBG and SSI  Hypothyroidism (TSH elevated but T4 ok)  Restless Leg Syndrome and Headaches -  Resume lexapro -  Requip resumed by family -  Due to risk of oversedation, will not resume flexeril or MSIR. -  Due to CAD/angina, not a candidate for eletriptan any longer  Normocytic anemia due to acute illness, no obvious blood loss -  Repeat CBC as outpatient and further work up for anemia if persists  DVT prophylaxis:  lovenox Code Status:  full Family Communication:  Patient alone Disposition Plan:  Likely to SNF pending PT/OT evaluations   Consultants:   Cardiology  Critical care   Procedures:  PORT CXR 6/13: endotracheal tube in good position. Enteric tube below the diaphragm. Pulmonary vascular congestion with bilateral interstitial prominence. No pleural effusion appreciated. No focal opacity.  CT Head/Neck 6/13> No evidence of traumatic intracranial injury or fracture. No evidence of acute fracture or subluxation along the cervical spine. Mild degenerative change along the cervical spine. CTA CHEST 6/13> No evidence of significant pulmonary embolus. Diffuse airspace disease and consolidation in both lungs compatible with multifocal pneumonia  or aspiration. EEG 6/14 > diffuse slowing LHC 6/14 > non-dominant LCX occlusion with collaterals thought to be adequate. No intervention. LVEF 40-45%  Antimicrobials:   Ceftriaxone 6/14 > 6/18  Augmentin 6/18 through 6/21   Subjective:  Feels well, eating better.   Denies SOB or chest pains.  Feels weak and unsteady.  Repeatedly asked my name and then forgot.    Objective: Filed Vitals:   11/24/15 0600 11/24/15 0844 11/24/15 0935 11/24/15 1100  BP:  128/90 116/65 110/70  Pulse:  81 96 79  Temp:  97.6 F (36.4 C)  97.8 F (36.6 C)  TempSrc:  Oral  Oral  Resp:  21  18  Height:      Weight: 87.998 kg (194 lb)     SpO2:  99%  100%    Intake/Output Summary (Last 24 hours) at 11/24/15 1203 Last data filed at 11/24/15 0955  Gross per 24 hour  Intake 1281.5 ml  Output   2800 ml  Net -1518.5 ml   Filed Weights   11/22/15 0300 11/23/15 0500 11/24/15 0600  Weight: 92.7 kg (204 lb 5.9 oz) 88.4 kg (194 lb 14.2 oz) 87.998 kg (194 lb)    Examination:  General exam:  Adult female, nasal canula in place.  No acute distress.  HEENT:  NCAT, MMM Respiratory system: Clear to auscultation bilaterally Cardiovascular system: Regular rate and rhythm, normal S1/S2. No murmurs, rubs, gallops or clicks.  Warm extremities Gastrointestinal system: Normal active bowel sounds, soft, nondistended, nontender. MSK:  Normal tone and bulk, no lower extremity edema Neuro:  Grossly moves all extremities.  Poor Joliyah Lippens term memory, intermittently confused.      Data Reviewed: I have personally reviewed following labs and imaging studies  CBC:  Recent Labs Lab 11/19/15 2035 11/19/15 2041 11/20/15 0425 11/21/15 0300 11/22/15 0330 11/23/15 1351  WBC 11.2*  --  9.7 5.2 11.6* 8.0  NEUTROABS 3.7  --  7.1  --   --   --   HGB 11.9* 13.6 10.6* 10.1* 10.8* 9.5*  HCT 37.8 40.0 33.2* 32.3* 34.2* 30.4*  MCV 88.5  --  90.0 87.3 88.4 86.9  PLT 367  --  320 228 261 [id number]   Basic Metabolic Panel:  Recent Labs Lab 11/19/15 2335 11/20/15 0425 11/20/15 2015 11/21/15 0300 11/22/15 0330 11/23/15 1351 11/24/15 0521  NA  --  138 139 138 140 141 140  K  --  3.9 4.9 3.4* 4.5 3.2* 3.0*  CL  --  110 111 111 108 101 99*  CO2  --  20* 22 19* 24 29 32  GLUCOSE  --  131* 106*  115* 108* 101* 101*  BUN  --  18 11 11 8 15 14   CREATININE  --  0.94 0.74 0.69 0.77 0.79 0.73  CALCIUM  --  7.5* 7.4* 7.6* 8.1* 8.2* 8.1*  MG 1.9 1.9  --  1.9  --   --   --   PHOS  --  2.9  2.8  --  1.6*  --   --   --    GFR: Estimated Creatinine Clearance: 76.7 mL/min (by C-G formula based on Cr of 0.73). Liver Function Tests:  Recent Labs Lab 11/19/15 2035 11/20/15 0425  AST 202*  --   ALT 96*  --   ALKPHOS 55  --   BILITOT 0.3  --   PROT 6.5  --   ALBUMIN 3.8 3.1*   No results for input(s): LIPASE, AMYLASE in the last 168  hours. No results for input(s): AMMONIA in the last 168 hours. Coagulation Profile:  Recent Labs Lab 11/19/15 2035 11/20/15 0425 11/21/15 0450  INR 1.10 1.16 1.24   Cardiac Enzymes:  Recent Labs Lab 11/19/15 2335 11/20/15 0425 11/20/15 1300 11/20/15 2015 11/21/15 0300  TROPONINI 5.24* 12.28* 25.78* 29.10* 45.60*  44.08*   BNP (last 3 results) No results for input(s): PROBNP in the last 8760 hours. HbA1C: No results for input(s): HGBA1C in the last 72 hours. CBG:  Recent Labs Lab 11/23/15 0755 11/23/15 1125 11/23/15 1641 11/23/15 2210 11/24/15 0841  GLUCAP 91 90 93 107* 95   Lipid Profile: No results for input(s): CHOL, HDL, LDLCALC, TRIG, CHOLHDL, LDLDIRECT in the last 72 hours. Thyroid Function Tests: No results for input(s): TSH, T4TOTAL, FREET4, T3FREE, THYROIDAB in the last 72 hours. Anemia Panel: No results for input(s): VITAMINB12, FOLATE, FERRITIN, TIBC, IRON, RETICCTPCT in the last 72 hours. Urine analysis:    Component Value Date/Time   COLORURINE STRAW* 11/19/2015 2126   APPEARANCEUR CLEAR 11/19/2015 2126   LABSPEC 1.014 11/19/2015 2126   PHURINE 6.5 11/19/2015 2126   GLUCOSEU 250* 11/19/2015 2126   HGBUR MODERATE* 11/19/2015 2126   BILIRUBINUR NEGATIVE 11/19/2015 2126   KETONESUR NEGATIVE 11/19/2015 2126   PROTEINUR 100* 11/19/2015 2126   UROBILINOGEN 0.2 01/02/2014 1057   NITRITE NEGATIVE 11/19/2015 2126     LEUKOCYTESUR NEGATIVE 11/19/2015 2126   Sepsis Labs: @LABRCNTIP (procalcitonin:4,lacticidven:4)  ) Recent Results (from the past 240 hour(s))  MRSA PCR Screening     Status: None   Collection Time: 11/19/15 11:28 PM  Result Value Ref Range Status   MRSA by PCR NEGATIVE NEGATIVE Final    Comment:        The GeneXpert MRSA Assay (FDA approved for NASAL specimens only), is one component of a comprehensive MRSA colonization surveillance program. It is not intended to diagnose MRSA infection nor to guide or monitor treatment for MRSA infections.       Radiology Studies: No results found.   Scheduled Meds: . antiseptic oral rinse  7 mL Mouth Rinse BID  . aspirin  325 mg Oral Daily  . atorvastatin  80 mg Per NG tube q1800  . cefTRIAXone (ROCEPHIN)  IV  1 g Intravenous Q24H  . famotidine (PEPCID) IV  20 mg Intravenous Q12H  . heparin subcutaneous  5,000 Units Subcutaneous Q8H  . insulin aspart  0-9 Units Subcutaneous TID AC & HS  . levothyroxine  75 mcg Intravenous Daily  . metoprolol succinate  25 mg Oral BID WC  . potassium chloride  40 mEq Oral Q4H  . rOPINIRole  0.5 mg Oral QHS   Continuous Infusions: . sodium chloride Stopped (11/23/15 1317)     LOS: 5 days    Time spent: 30 min    Janece Canterbury, MD [facility] Pager [phone]  If 7PM-7AM, please contact night-coverage [email or web address] Password [facility] 11/24/2015, 12:03 PM

## 2015-11-24 NOTE — Progress Notes (Signed)
Subjective:  Now on the stepdown unit.  She denies chest pain or shortness of breath.  Complains of a very minimal amount of lower abdominal pain.  Denies PND, orthopnea or edema.  Objective:  Vital Signs in the last 24 hours: BP 116/65 mmHg  Pulse 96  Temp(Src) 97.6 F (36.4 C) (Oral)  Resp 21  Ht 5\' 3"  (1.6 m)  Wt 87.998 kg (194 lb)  BMI 34.37 kg/m2  SpO2 99%  Physical Exam: Pleasant white female mildly obese sitting up in chair at bedside in no acute distress  Lungs: Clear  Cardiac:  Regular rhythm, normal S1 and S2, no S3 Extremities:  No edema present Neuro: She is oriented, does asked to couple of questions twice  Intake/Output from previous day: 06/17 0701 - 06/18 0700 In: 1031.5 [P.O.:840; I.V.:41.5; IV Piggyback:150] Out: 2075 [Urine:2075]  Weight Filed Weights   11/22/15 0300 11/23/15 0500 11/24/15 0600  Weight: 92.7 kg (204 lb 5.9 oz) 88.4 kg (194 lb 14.2 oz) 87.998 kg (194 lb)    Lab Results: Basic Metabolic Panel:  Recent Labs  11/23/15 1351 11/24/15 0521  NA 141 140  K 3.2* 3.0*  CL 101 99*  CO2 29 32  GLUCOSE 101* 101*  BUN 15 14  CREATININE 0.79 0.73   CBC:  Recent Labs  11/22/15 0330 11/23/15 1351  WBC 11.6* 8.0  HGB 10.8* 9.5*  HCT 34.2* 30.4*  MCV 88.4 86.9  PLT 261 255    Telemetry: Sinus rhythm  Assessment/Plan:  1.  Acute lateral myocardial infarction with cardiac arrest 2.  Out of hospital cardiac arrest with previous hypothermia protocol 3.  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  Anemia 5.  Hypokalemia   Recommendations:  EKG yesterday showed improvement in ST depression, no Q waves are noted.  Need to replace potassium.  Ancef activity with cardiac rehabilitation.  Needs PT and OT evaluations.  She did ask some questions twice today making me think there may be a subtle degree of neurologic deficit.    Kerry Hough  MD  [facility]  11/24/2015, 9:40 AM

## 2015-11-24 NOTE — Evaluation (Signed)
Physical Therapy Evaluation Patient Details Name: Brittany Mason MRN: [id number] DOB: Jul 22, 1950 Today's Date: 11/24/2015   History of Present Illness  Pt is a 65 y/o F s/p acute lateral MI w/ cardiac arrest. She received CPR from her roommate and was intubated by EMS.  Pt's PMH includes DVT, PE, chronic back pain, restless leg syndrome.    Clinical Impression  Pt admitted with above diagnosis. Pt currently with functional limitations due to the deficits listed below (see PT Problem List). Brittany Mason presents w/ generalized weakness, poor safety awareness, and mild cognitive impairments.  She currently requires min guard for transfers and min assist for ambulation.  She will have 24/7 supervision available from her roommate at d/c.  Pt will benefit from skilled PT to increase their independence and safety with mobility to allow discharge to the venue listed below.      Follow Up Recommendations Home health PT;Supervision for mobility/OOB    Equipment Recommendations  None recommended by PT    Recommendations for Other Services OT consult     Precautions / Restrictions Precautions Precautions: Fall;Other (comment) Precaution Comments: monitor O2 Restrictions Weight Bearing Restrictions: No      Mobility  Bed Mobility               General bed mobility comments: Pt sitting in recliner chair upon PT arrival  Transfers Overall transfer level: Needs assistance Equipment used: Rolling walker (2 wheeled);None Transfers: Sit to/from Stand Sit to Stand: Min guard         General transfer comment: Instability noted upon standing without AD and RW introduced.  Close min guard for safety.  Poorly controlled descent to sit despite cues.  Ambulation/Gait Ambulation/Gait assistance: Min assist Ambulation Distance (Feet): 100 Feet Assistive device: Rolling walker (2 wheeled) Gait Pattern/deviations: Step-through pattern;Staggering left;Staggering right   Gait velocity  interpretation: Below normal speed for age/gender General Gait Details: Pt staggers slightly Lt/Rt requiring assist to steady and seems unaware when she begins ambulating outside/ to the side of RW.  1 standing rest break as SpO2 reading 87%; however poor waveform.  Stairs            Wheelchair Mobility    Modified Rankin (Stroke Patients Only)       Balance Overall balance assessment: Needs assistance Sitting-balance support: No upper extremity supported;Feet supported Sitting balance-Leahy Scale: Good     Standing balance support: No upper extremity supported;During functional activity Standing balance-Leahy Scale: Poor Standing balance comment: Relies on UE support                             Pertinent Vitals/Pain Pain Assessment: 0-10 Pain Score: 3  Pain Location: chest Pain Descriptors / Indicators: Aching;Sore Pain Intervention(s): Limited activity within patient's tolerance;Monitored during session;Patient requesting pain meds-RN notified    Home Living Family/patient expects to be discharged to:: Private residence Living Arrangements: Non-relatives/Friends (roommate) Available Help at Discharge: Family;Available 24 hours/day (roommate is a Pharmacist, hospital and is off for the summer) Type of Home: House Home Access: Stairs to enter Entrance Stairs-Rails: None Entrance Stairs-Number of Steps: 3 Home Layout: One level Home Equipment: Clinical cytogeneticist - 2 wheels;Wheelchair - manual;Cane - single point      Prior Function Level of Independence: Independent               Hand Dominance   Dominant Hand: Right    Extremity/Trunk Assessment   Upper Extremity Assessment: Defer to OT evaluation  Lower Extremity Assessment: Generalized weakness      Cervical / Trunk Assessment: Other exceptions  Communication   Communication: No difficulties  Cognition Arousal/Alertness: Awake/alert Behavior During Therapy: Flat affect Overall  Cognitive Status: Impaired/Different from baseline Area of Impairment: Problem solving;Safety/judgement;Memory     Memory: Decreased short-term memory   Safety/Judgement: Decreased awareness of safety;Decreased awareness of deficits   Problem Solving: Slow processing;Requires verbal cues      General Comments      Exercises        Assessment/Plan    PT Assessment Patient needs continued PT services  PT Diagnosis Difficulty walking;Acute pain   PT Problem List Decreased strength;Decreased activity tolerance;Decreased balance;Decreased mobility;Decreased cognition;Decreased knowledge of use of DME;Decreased safety awareness;Cardiopulmonary status limiting activity;Pain  PT Treatment Interventions DME instruction;Gait training;Stair training;Functional mobility training;Therapeutic activities;Therapeutic exercise;Balance training;Cognitive remediation;Patient/family education   PT Goals (Current goals can be found in the Care Plan section) Acute Rehab PT Goals Patient Stated Goal: to go home PT Goal Formulation: With patient/family Time For Goal Achievement: 12/08/15 Potential to Achieve Goals: Good    Frequency Min 3X/week   Barriers to discharge Inaccessible home environment Steps to enter home    Co-evaluation               End of Session Equipment Utilized During Treatment: Gait belt;Oxygen Activity Tolerance: Patient limited by fatigue Patient left: in chair;with call bell/phone within reach;with family/visitor present Nurse Communication: Mobility status;Other (comment);Patient requests pain meds (SpO2)         Time: [id number] PT Time Calculation (min) (ACUTE ONLY): 22 min   Charges:   PT Evaluation $PT Eval Low Complexity: 1 Procedure     PT G Codes:       Collie Siad PT, DPT  Pager: [phone] Phone: [phone] 11/24/2015, 12:01 PM

## 2015-11-25 ENCOUNTER — Encounter (HOSPITAL_COMMUNITY): Payer: Self-pay | Admitting: Internal Medicine

## 2015-11-25 ENCOUNTER — Telehealth: Payer: Self-pay

## 2015-11-25 DIAGNOSIS — N179 Acute kidney failure, unspecified: Secondary | ICD-10-CM | POA: Diagnosis present

## 2015-11-25 DIAGNOSIS — E872 Acidosis, unspecified: Secondary | ICD-10-CM | POA: Diagnosis present

## 2015-11-25 DIAGNOSIS — Z86711 Personal history of pulmonary embolism: Secondary | ICD-10-CM

## 2015-11-25 DIAGNOSIS — J690 Pneumonitis due to inhalation of food and vomit: Secondary | ICD-10-CM

## 2015-11-25 DIAGNOSIS — R739 Hyperglycemia, unspecified: Secondary | ICD-10-CM

## 2015-11-25 DIAGNOSIS — E039 Hypothyroidism, unspecified: Secondary | ICD-10-CM

## 2015-11-25 DIAGNOSIS — D649 Anemia, unspecified: Secondary | ICD-10-CM

## 2015-11-25 LAB — GLUCOSE, CAPILLARY: GLUCOSE-CAPILLARY: 89 mg/dL (ref 65–99)

## 2015-11-25 LAB — BASIC METABOLIC PANEL
ANION GAP: 8 (ref 5–15)
BUN: 10 mg/dL (ref 6–20)
CHLORIDE: 104 mmol/L (ref 101–111)
CO2: 29 mmol/L (ref 22–32)
Calcium: 8.4 mg/dL — ABNORMAL LOW (ref 8.9–10.3)
Creatinine, Ser: 0.68 mg/dL (ref 0.44–1.00)
Glucose, Bld: 109 mg/dL — ABNORMAL HIGH (ref 65–99)
POTASSIUM: 3.4 mmol/L — AB (ref 3.5–5.1)
SODIUM: 141 mmol/L (ref 135–145)

## 2015-11-25 LAB — MAGNESIUM: MAGNESIUM: 1.6 mg/dL — AB (ref 1.7–2.4)

## 2015-11-25 MED ORDER — POTASSIUM CHLORIDE 10 MEQ/100ML IV SOLN
10.0000 meq | INTRAVENOUS | Status: AC
  Administered 2015-11-25: 10 meq via INTRAVENOUS
  Filled 2015-11-25: qty 100

## 2015-11-25 MED ORDER — ATORVASTATIN CALCIUM 80 MG PO TABS: 80.0000 mg | ORAL_TABLET | Freq: Every day | ORAL | Status: DC

## 2015-11-25 MED ORDER — FUROSEMIDE 40 MG PO TABS: 40.0000 mg | ORAL_TABLET | Freq: Every day | ORAL | Status: DC

## 2015-11-25 MED ORDER — LISINOPRIL 2.5 MG PO TABS
2.5000 mg | ORAL_TABLET | Freq: Every day | ORAL | Status: DC
Start: 2015-11-25 — End: 2015-11-25
  Administered 2015-11-25: 2.5 mg via ORAL
  Filled 2015-11-25: qty 1

## 2015-11-25 MED ORDER — LISINOPRIL 2.5 MG PO TABS: 2.5000 mg | ORAL_TABLET | Freq: Every day | ORAL | Status: DC

## 2015-11-25 MED ORDER — POTASSIUM CHLORIDE CRYS ER 20 MEQ PO TBCR
40.0000 meq | EXTENDED_RELEASE_TABLET | Freq: Every day | ORAL | Status: DC
Start: 2015-11-25 — End: 2015-12-05

## 2015-11-25 MED ORDER — AMOXICILLIN-POT CLAVULANATE 875-125 MG PO TABS: 1.0000 | ORAL_TABLET | Freq: Two times a day (BID) | ORAL | Status: DC

## 2015-11-25 MED ORDER — NITROGLYCERIN 0.4 MG/SPRAY TL SOLN: 1.0000 | Status: DC | PRN

## 2015-11-25 MED ORDER — ASPIRIN 81 MG PO CHEW: 81.0000 mg | CHEWABLE_TABLET | Freq: Every day | ORAL | Status: DC

## 2015-11-25 MED ORDER — CLOPIDOGREL BISULFATE 75 MG PO TABS: 75.0000 mg | ORAL_TABLET | Freq: Every day | ORAL | Status: DC

## 2015-11-25 MED ORDER — BUTALBITAL-APAP-CAFFEINE 50-325-40 MG PO TABS: 1.0000 | ORAL_TABLET | Freq: Four times a day (QID) | ORAL | Status: AC | PRN

## 2015-11-25 MED ORDER — SPIRONOLACTONE 25 MG PO TABS: 25.0000 mg | ORAL_TABLET | Freq: Every day | ORAL | Status: DC

## 2015-11-25 MED ORDER — METOPROLOL SUCCINATE ER 25 MG PO TB24: 25.0000 mg | ORAL_TABLET | Freq: Two times a day (BID) | ORAL | Status: DC

## 2015-11-25 MED FILL — NITROGLYCERIN LINGUAL 0.4 M: 0.4 | 30 days supply | Qty: 12 | Fill #0

## 2015-11-25 MED FILL — KLOR-CON M20 TABLET: 20 | 30 days supply | Qty: 60 | Fill #0

## 2015-11-25 MED FILL — CLOPIDOGREL 75 MG TABLET: 75 | 30 days supply | Qty: 30 | Fill #0

## 2015-11-25 MED FILL — SPIRONOLACTONE 25 MG TABLET: 25 | 30 days supply | Qty: 30 | Fill #0

## 2015-11-25 MED FILL — LISINOPRIL 2.5 MG TABLET: 2.5 | 30 days supply | Qty: 30 | Fill #0

## 2015-11-25 MED FILL — ATORVASTATIN 80 MG TABLET: 80 | 30 days supply | Qty: 30 | Fill #0

## 2015-11-25 MED FILL — FUROSEMIDE 40 MG TABLET: 40 | 30 days supply | Qty: 30 | Fill #0

## 2015-11-25 MED FILL — BUTALB-ACETAMIN-CAFF 50-325: 50-325-40 | 3 days supply | Qty: 20 | Fill #0

## 2015-11-25 MED FILL — AMOX TR-K CLV 875-125 MG TA: 875-125 | 2 days supply | Qty: 3 | Fill #0

## 2015-11-25 MED FILL — METOPROLOL SUCC ER 25 MG TA: 25 | 30 days supply | Qty: 60 | Fill #0

## 2015-11-25 NOTE — Progress Notes (Signed)
11/25/2015 1400 Discharge AVS meds taken today and those due this evening reviewed.  Follow-up appointments and when to call md reviewed.  D/C IV and TELE.  Questions and concerns addressed.   D/C home per orders. [place]

## 2015-11-25 NOTE — Consult Note (Signed)
   [facility] CM Inpatient Consult   11/25/2015  DEEDRE GARTHE [birth year] [id number]   Came by to visit Ms. Sax on behalf of [employer] program for [employer] employees/dependents with [employer]. Visit made prior to discharge. Supplied benefits contact numbers and [employer] and packet with contact information. Discussed that she will receive post hospital discharge calls. Confirmed best contact number as [phone]. Appreciative of visit. Will make inpatient RNCM aware.   Marthenia Rolling, MSN-Ed, RN,BSN [facility] Liaison [phone]

## 2015-11-25 NOTE — Care Management Note (Signed)
Case Management Note Marvetta Gibbons RN, BSN Unit 2W-Case Manager [phone]  Patient Details  Name: MALLORY DOETSCH MRN: [id number] Date of Birth: [birth year]  Subjective/Objective:  Pt admitted with cardiac arrest/NSTEMI                Action/Plan: PTA pt lived at home was independent- has SO has home- plan to return home - recommendations per PT/OT for [facility] therapies- for both PT/OT-HH- orders placed- per pt she has all needed DME that include RW and shower chair- spoke with pt at bedside- choice offered for [facility] agencies in [facility]- per pt she has used [place] in past- would like to use them again- now known as [facility]- referral called to [facility] with [facility] for HHPT/OT-   Expected Discharge Date:    11/25/15              Expected Discharge Plan:  [place]  In-House Referral:     Discharge planning Services  CM Consult  Post Acute Care Choice:  Home Health Choice offered to:  Patient  DME Arranged:  N/A DME Agency:  NA  HH Arranged:  PT, OT HH Agency:  [facility]  Status of Service:  Completed, signed off  Medicare Important Message Given:    Date Medicare IM Given:    Medicare IM give by:    Date Additional Medicare IM Given:    Additional Medicare Important Message give by:     If discussed at [place] of Stay Meetings, dates discussed:    Discharge Disposition: Home/home health   Additional Comments:  Dawayne Patricia, RN 11/25/2015, 12:29 PM

## 2015-11-25 NOTE — Telephone Encounter (Signed)
TOC pt-appt with Brittany Mason 12-02-15 at [place]

## 2015-11-25 NOTE — Discharge Summary (Signed)
Physician Discharge Summary  Brittany Mason [id number] DOB: 05/21/1951 DOA: 11/19/2015  PCP: Gennette Pac, MD  Admit date: 11/19/2015 Discharge date: 11/25/2015   Recommendations for Outpatient Follow-Up:   1. Home health PT set up.  The patient will also F/U with cardiac rehabilitation. 2. Recommend CBC in 1 week.   Discharge Diagnosis:   Principal Problem:    Cardiac arrest [facility]) Active Problems:    Hypothyroidism    Cardiogenic shock ([place])    NSTEMI (non-ST elevated myocardial infarction) ([place])    Acute respiratory failure with hypoxia (HCC)    History of pulmonary embolism    Lactic acidosis    AKI (acute kidney injury) ([place])    Aspiration pneumonia (HCC)    Hyperglycemia    Normocytic anemia    Restless legs syndrome    Migraine headaches   Discharge disposition:  Home.    Discharge Condition: Improved.  Diet recommendation: Low sodium, heart healthy.    History of Present Illness:   Brittany Mason is an 65 y.o. female with history of pulmonary embolism on chronic anticoagulation, mitral valve prolapse, hypothyroidism, hypercholesterolemia, and restless leg syndrome who presented with cardiac arrest at home.   Hospital Course by Problem:   Possible problems:  Cardiogenic shock S/P V Fib/PEA Arrest - At least 16 minutes before ROSC secondary to NSTEMI (CAD) CPR was performed by family within 3 minutes of the arrest. A total of 16-20 minutes of CPR was performed before return of spontaneous circulation. Initial rhythm was ventricular fibrillation which deteriorated to a pulseless electrical activity. Patient was intubated in the field by EMS. In the emergency department the patient was noted to have spontaneous movement but was not following commands but reportedly difficult to tell if purposeful. She underwent [place] protocol. Cardiac catheterization on 6/14 demonstrated a 45% stenosed proximal RCA to distal RCA lesion, a 65%  stenosis of the RPDA, and a 107 stenosis of the proximal circumflex to mid circumflex with "meager" collaterals. No intervention was performed.Continue Lipitor, Toprol XL, aspirin and Plavix.  Active Problems: Acute Hypoxic Respiratory Failure  Likely secondary to acute systolic heart failure EF 40-45% 2/2 NSTEMI. Continue by mouth Lasix and potassium supplementation.Respiratory status stable.  H/O DVT/PE July 2015  Previously treated with Xarelto but stopped 2017. Occurred while on hormone therapy.Anti-coagulation discontinued earlier this year, no longer on hormone replacement. PE ruled out on CTA chest 11/19/15.  Lactic Acidosis  Resolved.  AKI  Was due to cardiogenic shock > resolved at discharged.  Possible aspiration with leukocytosis and low grade fever Continue Augmentin for 3 more doses..  Hyperglycemia due to stress A1c 5.7, resolved.   Hypothyroidism (TSH elevated but T4 ok)  Restless Leg Syndrome and Headaches Continue Lexapro and Requip. Due to CAD/angina, not a candidate for eletriptan any longer.  Normocytic anemia due to acute illness, no obvious blood loss Repeat CBC as outpatient and further work up for anemia if persists.   Medical Consultants:    None.   Discharge Exam:   Filed Vitals:   11/25/15 0800 11/25/15 1213  BP: 118/66 113/67  Pulse: 80 80  Temp:  98.4 F (36.9 C)  Resp:  17   Filed Vitals:   11/24/15 2201 11/25/15 0700 11/25/15 0800 11/25/15 1213  BP: 103/55 110/64 118/66 113/67  Pulse: 79 71 80 80  Temp: 98.5 F (36.9 C) 98.2 F (36.8 C)  98.4 F (36.9 C)  TempSrc: Oral Oral  Oral  Resp: 20 18  17   Height:  Weight:      SpO2: 97% 93%  100%    Gen:  NAD Cardiovascular:  RRR, No M/R/G Respiratory: Lungs CTAB Gastrointestinal: Abdomen soft, NT/ND with normal active bowel sounds. Extremities: No C/E/C   The results of significant diagnostics from this hospitalization (including imaging, microbiology, ancillary and  laboratory) are listed below for reference.     Procedures and Diagnostic Studies:   Dg Abd 1 View  11/20/2015  CLINICAL DATA:  Encounter for orogastric tube placement EXAM: ABDOMEN - 1 VIEW COMPARISON:  None. FINDINGS: Orogastric tube projects over the left upper quadrant in the region of the stomach. Primarily gasless abdomen otherwise. IMPRESSION: Orogastric tube projects over the stomach Electronically Signed   By: Skipper Cliche M.D.   On: 11/20/2015 07:59   Ct Head Wo Contrast  11/19/2015  CLINICAL DATA:  Status post cardiac arrest. Assess for head or cervical spine injury. Initial encounter. EXAM: CT HEAD WITHOUT CONTRAST CT CERVICAL SPINE WITHOUT CONTRAST TECHNIQUE: Multidetector CT imaging of the head and cervical spine was performed following the standard protocol without intravenous contrast. Multiplanar CT image reconstructions of the cervical spine were also generated. COMPARISON:  MRI of the cervical spine performed 05/31/2005 FINDINGS: CT HEAD FINDINGS There is no evidence of acute infarction, mass lesion, or intra- or extra-axial hemorrhage on CT. The posterior fossa, including the cerebellum, brainstem and fourth ventricle, is within normal limits. The third and lateral ventricles, and basal ganglia are unremarkable in appearance. The cerebral hemispheres are symmetric in appearance, with normal gray-white differentiation. No mass effect or midline shift is seen. There is no evidence of fracture; visualized osseous structures are unremarkable in appearance. The orbits are within normal limits. The paranasal sinuses and mastoid air cells are well-aerated. No significant soft tissue abnormalities are seen. CT CERVICAL SPINE FINDINGS There is no evidence of acute fracture or subluxation. There is minimal grade 1 anterolisthesis of C3 on C4, and mild multilevel disc space narrowing along the lower cervical spine, with scattered small anterior and posterior disc osteophyte complexes. Underlying  facet disease is noted. The thyroid gland is unremarkable in appearance. Atelectasis or possibly aspiration is noted at the lung apices bilaterally, more prominent on the right. Endotracheal and orogastric tubes are noted. No significant soft tissue abnormalities are seen. IMPRESSION: 1. No evidence of traumatic intracranial injury or fracture. 2. No evidence of acute fracture or subluxation along the cervical spine. 3. Mild degenerative change along the cervical spine. 4. Atelectasis or possibly aspiration noted at the lung apices bilaterally, more prominent on the right. Electronically Signed   By: Garald Balding M.D.   On: 11/19/2015 23:18   Ct Angio Chest Pe W/cm &/or Wo Cm  11/19/2015  CLINICAL DATA:  Post cardiac arrest. Coded for 16 minutes. History of pulmonary embolus and DVT. EXAM: CT ANGIOGRAPHY CHEST WITH CONTRAST TECHNIQUE: Multidetector CT imaging of the chest was performed using the standard protocol during bolus administration of intravenous contrast. Multiplanar CT image reconstructions and MIPs were obtained to evaluate the vascular anatomy. CONTRAST:  88 mL Isovue 370 COMPARISON:  04/18/2013 FINDINGS: Technically adequate study with good opacification of the central and segmental pulmonary arteries. No discrete focal filling defects are demonstrated. No evidence of significant pulmonary embolus. Normal heart size. Mild coronary artery calcification. No pericardial effusion. Normal caliber thoracic aorta. Endotracheal and enteric tubes are present. Esophagus is decompressed. No significant lymphadenopathy in the chest. Motion artifact limits evaluation of the lungs. There is dense consolidation in both lungs posteriorly with patchy  airspace disease throughout the remainder of the lungs. Changes could be due to aspiration or multifocal pneumonia. No pneumothorax. No pleural effusions. Included portions of the upper abdominal organs are grossly unremarkable. The sternum and visualized ribs appear  intact without acute displaced fracture identified. Review of the MIP images confirms the above findings. IMPRESSION: No evidence of significant pulmonary embolus. Diffuse airspace disease and consolidation in both lungs compatible with multifocal pneumonia or aspiration. Electronically Signed   By: Lucienne Capers M.D.   On: 11/19/2015 22:53   Ct Cervical Spine Wo Contrast  11/19/2015  CLINICAL DATA:  Status post cardiac arrest. Assess for head or cervical spine injury. Initial encounter. EXAM: CT HEAD WITHOUT CONTRAST CT CERVICAL SPINE WITHOUT CONTRAST TECHNIQUE: Multidetector CT imaging of the head and cervical spine was performed following the standard protocol without intravenous contrast. Multiplanar CT image reconstructions of the cervical spine were also generated. COMPARISON:  MRI of the cervical spine performed 05/31/2005 FINDINGS: CT HEAD FINDINGS There is no evidence of acute infarction, mass lesion, or intra- or extra-axial hemorrhage on CT. The posterior fossa, including the cerebellum, brainstem and fourth ventricle, is within normal limits. The third and lateral ventricles, and basal ganglia are unremarkable in appearance. The cerebral hemispheres are symmetric in appearance, with normal gray-white differentiation. No mass effect or midline shift is seen. There is no evidence of fracture; visualized osseous structures are unremarkable in appearance. The orbits are within normal limits. The paranasal sinuses and mastoid air cells are well-aerated. No significant soft tissue abnormalities are seen. CT CERVICAL SPINE FINDINGS There is no evidence of acute fracture or subluxation. There is minimal grade 1 anterolisthesis of C3 on C4, and mild multilevel disc space narrowing along the lower cervical spine, with scattered small anterior and posterior disc osteophyte complexes. Underlying facet disease is noted. The thyroid gland is unremarkable in appearance. Atelectasis or possibly aspiration is noted  at the lung apices bilaterally, more prominent on the right. Endotracheal and orogastric tubes are noted. No significant soft tissue abnormalities are seen. IMPRESSION: 1. No evidence of traumatic intracranial injury or fracture. 2. No evidence of acute fracture or subluxation along the cervical spine. 3. Mild degenerative change along the cervical spine. 4. Atelectasis or possibly aspiration noted at the lung apices bilaterally, more prominent on the right. Electronically Signed   By: Garald Balding M.D.   On: 11/19/2015 23:18   Dg Chest Port 1 View  11/20/2015  CLINICAL DATA:  Central line placement, intubated EXAM: PORTABLE CHEST 1 VIEW COMPARISON:  11/19/2015 FINDINGS: Slight worsening in aeration. There is right upper lobe and left perihilar airspace disease suspicious for multifocal pneumonia. Mild bilateral basilar atelectasis or infiltrate. Stable endotracheal and NG tube position. There is right IJ central line with tip in SVC. No pneumothorax. IMPRESSION: Slight worsening in aeration. Right upper lobe and left perihilar airspace disease suspicious for bilateral pneumonia. Mild basilar atelectasis or infiltrate. Stable endotracheal and NG tube position. Right IJ central line in place. No pneumothorax. Electronically Signed   By: Lahoma Crocker M.D.   On: 11/20/2015 13:20   Dg Chest Port 1 View  11/19/2015  CLINICAL DATA:  Post CPR.  History of mitral valve prolapse. EXAM: PORTABLE CHEST 1 VIEW COMPARISON:  Chest x-ray dated 08/21/2015. FINDINGS: Mild cardiomegaly is stable. Endotracheal tube is well positioned with tip approximately 2.5 cm above the carina. Enteric tube passes below the diaphragm. Central pulmonary vascular congestion and bilateral interstitial edema, most confluent within the right upper lung. No pleural  effusion or pneumothorax seen. Osseous structures about the chest are unremarkable. IMPRESSION: 1. Mild cardiomegaly with central pulmonary vascular congestion and bilateral interstitial  edema indicating mild volume overload and/or sequela of resuscitation efforts. Lungs otherwise clear. No pneumothorax. 2. Endotracheal tube well positioned with tip approximately 2.5 cm above the carina. Electronically Signed   By: Franki Cabot M.D.   On: 11/19/2015 21:09     Labs:   Basic Metabolic Panel:  Recent Labs Lab 11/19/15 2335 11/20/15 0425  11/21/15 0300 11/22/15 0330 11/23/15 1351 11/24/15 0521 11/25/15 0343  NA  --  138  < > 138 140 141 140 141  K  --  3.9  < > 3.4* 4.5 3.2* 3.0* 3.4*  CL  --  110  < > 111 108 101 99* 104  CO2  --  20*  < > 19* 24 29 32 29  GLUCOSE  --  131*  < > 115* 108* 101* 101* 109*  BUN  --  18  < > 11 8 15 14 10   CREATININE  --  0.94  < > 0.69 0.77 0.79 0.73 0.68  CALCIUM  --  7.5*  < > 7.6* 8.1* 8.2* 8.1* 8.4*  MG 1.9 1.9  --  1.9  --   --   --  1.6*  PHOS  --  2.9  2.8  --  1.6*  --   --   --   --   < > = values in this interval not displayed. GFR Estimated Creatinine Clearance: 76.7 mL/min (by C-G formula based on Cr of 0.68). Liver Function Tests:  Recent Labs Lab 11/19/15 2035 11/20/15 0425  AST 202*  --   ALT 96*  --   ALKPHOS 55  --   BILITOT 0.3  --   PROT 6.5  --   ALBUMIN 3.8 3.1*   Coagulation profile  Recent Labs Lab 11/19/15 2035 11/20/15 0425 11/21/15 0450  INR 1.10 1.16 1.24    CBC:  Recent Labs Lab 11/19/15 2035 11/19/15 2041 11/20/15 0425 11/21/15 0300 11/22/15 0330 11/23/15 1351  WBC 11.2*  --  9.7 5.2 11.6* 8.0  NEUTROABS 3.7  --  7.1  --   --   --   HGB 11.9* 13.6 10.6* 10.1* 10.8* 9.5*  HCT 37.8 40.0 33.2* 32.3* 34.2* 30.4*  MCV 88.5  --  90.0 87.3 88.4 86.9  PLT 367  --  320 228 261 255   Cardiac Enzymes:  Recent Labs Lab 11/19/15 2335 11/20/15 0425 11/20/15 1300 11/20/15 2015 11/21/15 0300  TROPONINI 5.24* 12.28* 25.78* 29.10* 45.60*  44.08*   CBG:  Recent Labs Lab 11/23/15 2210 11/24/15 0841 11/24/15 1151 11/24/15 2134 11/25/15 0658  GLUCAP 107* 95 112* 116* 89    Microbiology Recent Results (from the past 240 hour(s))  MRSA PCR Screening     Status: None   Collection Time: 11/19/15 11:28 PM  Result Value Ref Range Status   MRSA by PCR NEGATIVE NEGATIVE Final    Comment:        The GeneXpert MRSA Assay (FDA approved for NASAL specimens only), is one component of a comprehensive MRSA colonization surveillance program. It is not intended to diagnose MRSA infection nor to guide or monitor treatment for MRSA infections.      Discharge Instructions:       Discharge Instructions    (HEART FAILURE PATIENTS) Call MD:  Anytime you have any of the following symptoms: 1) 3 pound weight gain in 24 hours  or 5 pounds in 1 week 2) shortness of breath, with or without a dry hacking cough 3) swelling in the hands, feet or stomach 4) if you have to sleep on extra pillows at night in order to breathe.    Complete by:  As directed      AMB Referral to [facility]    Complete by:  As directed   Please assign [employer] member for toc. To discharge home today 11/25/15. Please call with questions. Marthenia Rolling, [place], [facility] [phone]  Reason for consult:  Please assign [employer] member for toc  Expected date of contact:  1-3 days (reserved for hospital discharges)     Amb Referral to Cardiac Rehabilitation    Complete by:  As directed   Diagnosis:  NSTEMI     Call MD for:  extreme fatigue    Complete by:  As directed      Call MD for:  persistant dizziness or light-headedness    Complete by:  As directed      Call MD for:  persistant nausea and vomiting    Complete by:  As directed      Call MD for:  severe uncontrolled pain    Complete by:  As directed      Diet - low sodium heart healthy    Complete by:  As directed      Face-to-face encounter (required for Medicare/Medicaid patients)    Complete by:  As directed   I RAMA,CHRISTINA certify that this patient is under my care and that I, or a nurse practitioner or  physician's assistant working with me, had a face-to-face encounter that meets the physician face-to-face encounter requirements with this patient on 11/25/2015. The encounter with the patient was in whole, or in part for the following medical condition(s) which is the primary reason for home health care (List medical condition): CAD s/p arrest, new CHF.  The encounter with the patient was in whole, or in part, for the following medical condition, which is the primary reason for home health care:  CAD s/p cardiac arrest, CHF  I certify that, based on my findings, the following services are medically necessary home health services:  Physical therapy  Reason for Medically Necessary Home Health Services:   Therapy- Instruction on use of Assistive Device for Ambulation on all Surfaces Therapy- Instruction on Safe use of Assistive Devices for ADLs Therapy- Home Adaptation to Facilitate Safety Therapy- Therapeutic Exercises to Increase Strength and Endurance    My clinical findings support the need for the above services:  Unable to leave home safely without assistance and/or assistive device  Further, I certify that my clinical findings support that this patient is homebound due to:  Unable to leave home safely without assistance     Home Health    Complete by:  As directed   To provide the following care/treatments:  PT     Increase activity slowly    Complete by:  As directed             Medication List    STOP taking these medications        eletriptan 40 MG tablet  Commonly known as:  RELPAX     simvastatin 40 MG tablet  Commonly known as:  ZOCOR      TAKE these medications        amoxicillin-clavulanate 875-125 MG tablet  Commonly known as:  AUGMENTIN  Take 1 tablet by mouth every 12 (twelve)  hours.     aspirin 81 MG tablet  Take 81 mg by mouth daily.     atorvastatin 80 MG tablet  Commonly known as:  LIPITOR  1 tablet (80 mg total) by Per NG tube route daily at 6 PM.      butalbital-acetaminophen-caffeine 50-325-40 MG tablet  Commonly known as:  FIORICET  Take 1-2 tablets by mouth every 6 (six) hours as needed for headache.     clopidogrel 75 MG tablet  Commonly known as:  PLAVIX  Take 1 tablet (75 mg total) by mouth daily.     cyclobenzaprine 5 MG tablet  Commonly known as:  FLEXERIL  Take 5 mg by mouth 3 (three) times daily.     diclofenac 75 MG EC tablet  Commonly known as:  VOLTAREN  Take 75 mg by mouth daily.     escitalopram 20 MG tablet  Commonly known as:  LEXAPRO  Take 20 mg by mouth every morning.     fenofibrate 160 MG tablet  Take 160 mg by mouth daily. Takes at bedtime     furosemide 40 MG tablet  Commonly known as:  LASIX  Take 1 tablet (40 mg total) by mouth daily.     levothyroxine 150 MCG tablet  Commonly known as:  SYNTHROID, LEVOTHROID  Take 150 mcg by mouth daily before breakfast.     lisinopril 2.5 MG tablet  Commonly known as:  PRINIVIL,ZESTRIL  Take 1 tablet (2.5 mg total) by mouth daily.     metoprolol succinate 25 MG 24 hr tablet  Commonly known as:  TOPROL-XL  Take 1 tablet (25 mg total) by mouth 2 (two) times daily with a meal.     morphine 30 MG tablet  Commonly known as:  MSIR  Take 30 mg by mouth every 12 (twelve) hours.     nitroGLYCERIN 0.4 MG/SPRAY spray  Commonly known as:  NITROLINGUAL  Place 1 spray under the tongue every 5 (five) minutes x 3 doses as needed for chest pain.     omega-3 acid ethyl esters 1 g capsule  Commonly known as:  LOVAZA  Take 2 g by mouth 2 (two) times daily.     OVER THE COUNTER MEDICATION  Take 1 capsule by mouth daily. "Relizen" herbal supplement for hormone replacement     potassium chloride SA 20 MEQ tablet  Commonly known as:  K-DUR,KLOR-CON  Take 2 tablets (40 mEq total) by mouth daily.     rOPINIRole 0.5 MG tablet  Commonly known as:  REQUIP  Take 0.5 mg by mouth every evening.     spironolactone 25 MG tablet  Commonly known as:  ALDACTONE  Take 1 tablet  (25 mg total) by mouth daily.       Follow-up Information    Follow up with Gennette Pac, MD. Go on 12/05/2015.   Specialty:  Family Medicine   Why:  APPOINTMENT TIME 12:45   Contact information:   [place] [address] [phone]       Follow up with Truitt Merle, NP On 12/02/2015.   Specialties:  Nurse Practitioner, Interventional Cardiology, Cardiology, Radiology   Why:  [place] on 12/02/2015 at 9:00AM. (Office Across from [place] [facility]).   Contact information:   [place] [place] [address] [phone]       Follow up with [facility].   Why:  HHPT/OT arranged- they will call you to set up  home visits (now known as [facility])   Contact information:   [place] [address] [phone]        Time coordinating discharge: 35 minutes.  Signed:  RAMA,CHRISTINA  Pager [phone] [facility] 11/25/2015, 4:23 PM

## 2015-11-25 NOTE — Progress Notes (Signed)
Realized pt is for d/c home so finished ed including ex gl, NTG. Voiced understanding, good comprehension. Roommate is present, nervous about this happening again.  LO:[id number] [place], ACSM 11:46 AM 11/25/2015

## 2015-11-25 NOTE — Progress Notes (Signed)
CARDIAC REHAB PHASE I   PRE:  Rate/Rhythm: 86 SR    BP: sitting 108/51    SaO2: 97 RA  MODE:  Ambulation: 500 ft   POST:  Rate/Rhythm: 102 ST    BP: sitting 119/64     SaO2: 96 RA  Pt sitting in recliner, had been up in room with roommate's supervision and RW. Pt able to stand and walk with RW. Used gait belt for safety but pt was steady, no LOB. Long distance without c/o. Return to recliner. Began ed with pt and roommate. Very receptive, pt asking appropriate questions. She is able to remember information today. Pt is eager to do CRPII and will send referral to [place]. Will f/u tomorrow. She should walk more today. [place], ACSM 11/25/2015 9:36 AM

## 2015-11-25 NOTE — Progress Notes (Signed)
Hospital Problem List     Active Problems:   Cardiac arrest [facility])   Cardiogenic shock ([place])   NSTEMI (non-ST elevated myocardial infarction) ([place])   Acute respiratory failure with hypoxia [facility])    Patient Profile:   Primary Cardiologist: Listed as New, but has been followed by Dr. Gwenlyn Found prior to admission.  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 Cath on 6/14 showing total occlusion of prox circumflex (conservative management favored at that time due to cooling protocol and further neurologic recovery).   Subjective   Denies any chest discomfort or shortness of breath overnight. Preparing to ambulate with cardiac rehab. Has multiple questions about her heart attack and these were answered during the encounter.  Inpatient Medications    . amoxicillin-clavulanate  1 tablet Oral Q12H  . aspirin  325 mg Oral Daily  . atorvastatin  80 mg Per NG tube q1800  . clopidogrel  75 mg Oral Daily  . escitalopram  20 mg Oral Daily  . furosemide  40 mg Oral Daily  . levothyroxine  150 mcg Oral QAC breakfast  . metoprolol succinate  25 mg Oral BID WC  . potassium chloride  40 mEq Oral Daily  . rOPINIRole  0.5 mg Oral QHS    Vital Signs    Filed Vitals:   11/24/15 1640 11/24/15 2201 11/25/15 0700 11/25/15 0800  BP: 115/69 103/55 110/64 118/66  Pulse: 82 79 71 80  Temp: 98.9 F (37.2 C) 98.5 F (36.9 C) 98.2 F (36.8 C)   TempSrc: Oral Oral Oral   Resp: 22 20 18    Height:      Weight:      SpO2: 99% 97% 93%     Intake/Output Summary (Last 24 hours) at 11/25/15 0830 Last data filed at 11/24/15 1300  Gross per 24 hour  Intake    770 ml  Output   1000 ml  Net   -230 ml   Filed Weights   11/22/15 0300 11/23/15 0500 11/24/15 0600  Weight: 204 lb 5.9 oz (92.7 kg) 194 lb 14.2 oz (88.4 kg) 194 lb (87.998 kg)    Physical Exam    General: Well  developed, well nourished, female appearing in no acute distress. Head: Normocephalic, atraumatic.  Neck: Supple without bruits, JVD not elevated. Lungs:  Resp regular and unlabored, CTA without wheezing or rales. Heart: RRR, S1, S2, no S3, S4, or murmur; no rub. Abdomen: Soft, non-tender, non-distended with normoactive bowel sounds. No hepatomegaly. No rebound/guarding. No obvious abdominal masses. Extremities: No clubbing, cyanosis, or edema. Distal pedal pulses are 2+ bilaterally. Neuro: Alert and oriented X 3. Moves all extremities spontaneously. Psych: Normal affect.  Labs    CBC  Recent Labs  11/23/15 1351  WBC 8.0  HGB 9.5*  HCT 30.4*  MCV 86.9  PLT 123456   Basic Metabolic Panel  Recent Labs  11/24/15 0521 11/25/15 0343  NA 140 141  K 3.0* 3.4*  CL 99* 104  CO2 32 29  GLUCOSE 101* 109*  BUN 14 10  CREATININE 0.73 0.68  CALCIUM 8.1* 8.4*  MG  --  1.6*     Telemetry    NSR, HR in 80's - 90's. No atopic events.  ECG    No new tracings.    Cardiac Studies and Radiology     Dg Chest Port 1 View: 11/22/2015  CLINICAL DATA:  Intubation. EXAM: PORTABLE CHEST 1 VIEW COMPARISON:  11/21/2015. FINDINGS: Endotracheal tube, NG tube, right IJ line stable position. Mild cardiomegaly. Pulmonary venous congestion. Diffuse bilateral pulmonary infiltrates most consistent pulmonary edema. Slight interim clearing from prior exam. No pleural effusion or pneumothorax. IMPRESSION: 1. Lines and tubes in stable position. 2. Persistent cardiomegaly mild pulmonary venous congestion. Bilateral pulmonary infiltrates again noted, slightly improved prior exam. Findings consistent with slight improvement of congestive heart failure. Electronically Signed   By: Marcello Moores  Register   On: 11/22/2015 07:03    Cardiac Catheterization: 11/20/2015 1. Prox RCA to Dist RCA lesion, 45% stenosed. 2. RPDA lesion, 65% stenosed. 3. Prox Cx to Mid Cx lesion, 100% stenosed.   Total occlusion (Acute vs  Chronic) of the proximal circumflex with collaterals from left to left and right to left. The collaterals appear to be meager however, the patient is on the cooling protocol and I suspect that collateralization will significantly improved once the patient is back to body temperature. If angina develops post arrest, recanalization of the circumflex coronary artery would be reasonable.  Diffuse moderate stenosis in a large PDA, up to 70% obstruction. Relatively small distribution LAD that reaches, but does not wrap around, the left ventricular apex. No significant obstruction is noted in the LAD.  Mid inferior wall moderate hypokinesis. Estimated ejection fraction 45-50%. Marked elevation in left ventricular end-diastolic pressure of 40 mmHg secondary to acute ischemia and cooling.   Recommendations:  Conservative medical management for the time being.  I would add Plavix to the patient's medical regimen.  Should the patient have a significant neurological recovery, angina/ongoing evidence of ischemia could be treated with circumflex PCI. We chose against PCI today given cooling, vasoconstriction related to cooling, and clinical stability at the present time without evidence of ongoing ischemia.   Echocardiogram: 11/20/2015 Study Conclusions  - Left ventricle: The cavity size was normal. There was mild focal  basal hypertrophy of the septum. Systolic function was moderately  reduced. The estimated ejection fraction was in the range of 35%  to 40%. Diffuse hypokinesis. There is akinesis of the  mid-apicalinferolateral and inferior myocardium. Doppler  parameters are consistent with abnormal left ventricular  relaxation (grade 1 diastolic dysfunction). - Aortic valve: Trileaflet; mildly thickened, mildly calcified  leaflets. There was mild regurgitation directed eccentrically in  the LVOT. - Aorta: Aortic root dimension: 38 mm (ED). - Ascending aorta: The ascending aorta was mildly  dilated. - Mitral valve: Calcified annulus. - Right ventricle: The cavity size was moderately dilated. Wall  thickness was normal. Systolic function was moderately reduced.  Assessment & Plan    1. V-fib Cardiac Arrest/ NSTEMI - presented to [facility] on 11/19/2015 for a V-fib cardiac arrest with 16 minutes of down-time. Cath on 6/14 showed total occlusion of prox circumflex (conservative management favored at that time due to cooling protocol and to assess for further neurologic recovery). She denies any anginal symptoms at this time. Appears neurologic recovery has improved (A&Ox3). Could consider re-look cath prior to discharge as the pCx was not intervened on due to her acute state at the time of initial cath. - cyclic troponin values peaked to 45.60.  - continue ASA, Plavix, statin, and BB. Currently on 325mg  ASA daily, consider reducing to 81mg  daily.   2. Acute Systolic CHF - echo this admission showed an EF of 35-40% with diffuse HK and akinesis of the mid-apical inferolateral and inferior myocardium.  - does not appear volume overloaded on physical  exam. - IV Lasix switched to PO. Continue PO Lasix and BB, low-dose ACE-I. Consider addition of Spironolactone prior to discharge if BP will allow.  3. Hypokalemia  - 3.4 this AM. Scheduled to be replaced.  Signed, Erma Heritage , PA-C 8:30 AM 11/25/2015 Pager: [phone]  I have examined the patient and reviewed assessment and plan and discussed with patient.  Agree with above as stated.  DOing well from a cardiac standpoint.  Walked with cardiac rehab.  HH PT recommended.  I spoke to Dr. Rockne Menghini, who will order this.  No angina.  WOuld not pursue relook cath.  Continue plavix. WOuld decrease to a baby aspirin. Hold off on spironolactone at this time. Reconsider as outpatient.    Brittany Mason

## 2015-11-25 NOTE — Progress Notes (Signed)
Occupational Therapy Evaluation Patient Details Name: FRICA LAHOOD MRN: [id number] DOB: 1950-09-17 Today's Date: 11/25/2015    History of Present Illness Pt is a 65 y/o F s/p acute lateral MI w/ cardiac arrest. She received CPR from her roommate and was intubated by EMS.  Pt's PMH includes DVT, PE, chronic back pain, restless leg syndrome.   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 Pt assessed with the [facility] Cognitive Assessment) and received a score of 24/30 (under 26 considered abnormal) - demonstrated difficulty with delayed recall and higher level executive cognitive and visuospatial skills. Recommend follow up with [facility]. Pt will most likely need to follow up with OT at the neuro outpt center after [facility] to facilitate safe return to work and driving when appropriate.     Follow Up Recommendations  Home health OT;Supervision/Assistance - 24 hour    Equipment Recommendations  None recommended by OT    Recommendations for Other Services       Precautions / Restrictions Precautions Precautions: Fall;Other (comment) Precaution Comments: monitor O2 Restrictions Weight Bearing Restrictions: No      Mobility Bed Mobility               General bed mobility comments: Pt sitting in recliner chair   Transfers Overall transfer level: Needs assistance Equipment used: Rolling walker (2 wheeled);None Transfers: Sit to/from Stand Sit to Stand: Min guard         General transfer comment: increased instability as pt is distracted    Balance     Sitting balance-Leahy Scale: Good       Standing balance-Leahy Scale: Poor                              ADL Overall ADL's : Needs assistance/impaired                                     Functional mobility during ADLs: Min guard General ADL Comments: Pt requries set up and overall S for bathing and dressing. Min guard assist for funcitonal ambulation for  ADL. Recommended to use a shower chair and have HHA when stepping over shower     Vision Vision Assessment?: No apparent visual deficits   Perception Perception Spatial deficits: unable to copy a cube    Praxis      Pertinent Vitals/Pain Pain Assessment: No/denies pain     Hand Dominance Right   Extremity/Trunk Assessment Upper Extremity Assessment Upper Extremity Assessment: Generalized weakness   Lower Extremity Assessment Lower Extremity Assessment: Generalized weakness   Cervical / Trunk Assessment Cervical / Trunk Assessment: Other exceptions Cervical / Trunk Exceptions: chronic back pain   Communication Communication Communication: No difficulties   Cognition Arousal/Alertness: Awake/alert Behavior During Therapy: Flat affect Overall Cognitive Status: Impaired/Different from baseline Area of Impairment: Problem solving;Safety/judgement;Memory;Attention;Orientation;Awareness Orientation Level: Disoriented to;Time Current Attention Level: Selective Memory: Decreased short-term memory   Safety/Judgement: Decreased awareness of safety;Decreased awareness of deficits Awareness: Emergent Problem Solving: Slow processing;Requires verbal cues General Comments: Assessed wtih the [facility] receiving a score of 24/30 demonstrting difficulty with visuospatial/executive functions, delayed recall and orientation   General Comments       Exercises       Shoulder Instructions      Home Living Family/patient expects to be discharged to:: Private residence Living Arrangements: Non-relatives/Friends (roommate) Available Help  at Discharge: Family;Available 24 hours/day (roommate is a Pharmacist, hospital and is off for the summer) Type of Home: House Home Access: Stairs to enter [employer] of Steps: 3 Entrance Stairs-Rails: None Home Layout: One level     Bathroom Shower/Tub: Occupational psychologist: Standard Bathroom Accessibility: Yes How Accessible:  Accessible via walker Home Equipment: Shower seat;Walker - 2 wheels;Wheelchair - manual;Cane - single point          Prior Functioning/Environment Level of Independence: Independent        Comments: works in Engineer, technical sales at [employer]    OT Diagnosis: Generalized weakness;Cognitive deficits;Acute pain   OT Problem List: Decreased strength;Decreased activity tolerance;Impaired balance (sitting and/or standing);Decreased cognition;Decreased safety awareness;Decreased knowledge of use of DME or AE;Cardiopulmonary status limiting activity;Obesity;Pain   OT Treatment/Interventions: Self-care/ADL training;Therapeutic exercise;Energy conservation;DME and/or AE instruction;Therapeutic activities;Cognitive remediation/compensation;Patient/family education;Balance training    OT Goals(Current goals can be found in the care plan section) Acute Rehab OT Goals Patient Stated Goal: to go home and go back to work OT Goal Formulation: All assessment and education complete, DC therapy (Continue with HHOT)  OT Frequency:     Barriers to D/C:            Co-evaluation              End of Session Equipment Utilized During Treatment: Gait belt Nurse Communication: Mobility status;Other (comment) (need for HHOT)  Activity Tolerance: Patient tolerated treatment well Patient left: in chair;with call bell/phone within reach;with nursing/sitter in room;with family/visitor present   Time: [id number] OT Time Calculation (min): 24 min Charges:  OT General Charges $OT Visit: 1 Procedure OT Evaluation $OT Eval Moderate Complexity: 1 Procedure OT Treatments $Therapeutic Activity: 8-22 mins G-Codes:    Raima Geathers,HILLARY 08-Dec-2015, 12:42 PM   [facility], OTR/L  [phone] 2015/12/08

## 2015-11-26 DIAGNOSIS — Z86718 Personal history of other venous thrombosis and embolism: Secondary | ICD-10-CM | POA: Diagnosis not present

## 2015-11-26 DIAGNOSIS — I214 Non-ST elevation (NSTEMI) myocardial infarction: Secondary | ICD-10-CM | POA: Diagnosis not present

## 2015-11-26 DIAGNOSIS — G43909 Migraine, unspecified, not intractable, without status migrainosus: Secondary | ICD-10-CM | POA: Diagnosis not present

## 2015-11-26 DIAGNOSIS — I5189 Other ill-defined heart diseases: Secondary | ICD-10-CM | POA: Diagnosis not present

## 2015-11-26 DIAGNOSIS — Z8674 Personal history of sudden cardiac arrest: Secondary | ICD-10-CM | POA: Diagnosis not present

## 2015-11-26 DIAGNOSIS — M1711 Unilateral primary osteoarthritis, right knee: Secondary | ICD-10-CM | POA: Diagnosis not present

## 2015-11-26 DIAGNOSIS — M549 Dorsalgia, unspecified: Secondary | ICD-10-CM | POA: Diagnosis not present

## 2015-11-26 DIAGNOSIS — Z86711 Personal history of pulmonary embolism: Secondary | ICD-10-CM | POA: Diagnosis not present

## 2015-11-26 DIAGNOSIS — E7800 Pure hypercholesterolemia, unspecified: Secondary | ICD-10-CM | POA: Diagnosis not present

## 2015-11-26 NOTE — Telephone Encounter (Signed)
Patient contacted regarding discharge from  [facility] on 11/25/15.  Patient understands to follow up with provider Truitt Merle NP on 12/02/15 at 9:00 am at [address] Patient understands discharge instructions? yes Patient understands medications and regiment? yes Patient understands to bring all medications to this visit? yes  Patient was wondering which doctor she would be following up with after her appointment with Truitt Merle NP. Patient requested Dr. Radford Pax, who consulted on patient in the hospital.

## 2015-11-27 ENCOUNTER — Other Ambulatory Visit: Payer: Self-pay | Admitting: *Deleted

## 2015-11-27 DIAGNOSIS — Z8674 Personal history of sudden cardiac arrest: Secondary | ICD-10-CM | POA: Diagnosis not present

## 2015-11-27 DIAGNOSIS — I5189 Other ill-defined heart diseases: Secondary | ICD-10-CM | POA: Diagnosis not present

## 2015-11-27 DIAGNOSIS — M549 Dorsalgia, unspecified: Secondary | ICD-10-CM | POA: Diagnosis not present

## 2015-11-27 DIAGNOSIS — Z86711 Personal history of pulmonary embolism: Secondary | ICD-10-CM | POA: Diagnosis not present

## 2015-11-27 DIAGNOSIS — M1711 Unilateral primary osteoarthritis, right knee: Secondary | ICD-10-CM | POA: Diagnosis not present

## 2015-11-27 DIAGNOSIS — G43909 Migraine, unspecified, not intractable, without status migrainosus: Secondary | ICD-10-CM | POA: Diagnosis not present

## 2015-11-27 DIAGNOSIS — E7800 Pure hypercholesterolemia, unspecified: Secondary | ICD-10-CM | POA: Diagnosis not present

## 2015-11-27 DIAGNOSIS — I214 Non-ST elevation (NSTEMI) myocardial infarction: Secondary | ICD-10-CM | POA: Diagnosis not present

## 2015-11-27 DIAGNOSIS — Z86718 Personal history of other venous thrombosis and embolism: Secondary | ICD-10-CM | POA: Diagnosis not present

## 2015-11-27 NOTE — Patient Outreach (Signed)
Brittany Mason) Care Management  11/27/2015  Brittany Mason July 08, 1950 [id number]   Subjective: Telephone call to patient's home number, no answer, left HIPAA compliant voicemail message, and requested call back.  Objective: Per chart review: Patient hospitalized 11/19/15 - 11/25/15 for cardiac arrest.   Patient has a history of: Hypothyroidism, Cardiogenic shock, NSTEMI (non-ST elevated myocardial infarction), Acute respiratory failure with hypoxia, pulmonary embolism, AKI (acute kidney injury,  Aspiration pneumonia, Hyperglycemia,  Normocytic anemia, Restless legs syndrome, acute systolic heart failure, and Migraine headaches.    Assessment: Received [facility] Transition of care referral on 11/25/15.   Transition of care follow up, pending patient contact.   Plan: RNCM will call patient for 2nd attempt outreach within 2 weeks, transition follow up,  if no return call from patient.  Brittany Mason H. Annia Friendly, BSN, [place] [facility] Telephonic CM Phone: [phone] Fax: [phone]

## 2015-11-29 DIAGNOSIS — I5189 Other ill-defined heart diseases: Secondary | ICD-10-CM | POA: Diagnosis not present

## 2015-11-29 DIAGNOSIS — Z86718 Personal history of other venous thrombosis and embolism: Secondary | ICD-10-CM | POA: Diagnosis not present

## 2015-11-29 DIAGNOSIS — M549 Dorsalgia, unspecified: Secondary | ICD-10-CM | POA: Diagnosis not present

## 2015-11-29 DIAGNOSIS — M1711 Unilateral primary osteoarthritis, right knee: Secondary | ICD-10-CM | POA: Diagnosis not present

## 2015-11-29 DIAGNOSIS — G43909 Migraine, unspecified, not intractable, without status migrainosus: Secondary | ICD-10-CM | POA: Diagnosis not present

## 2015-11-29 DIAGNOSIS — I214 Non-ST elevation (NSTEMI) myocardial infarction: Secondary | ICD-10-CM | POA: Diagnosis not present

## 2015-11-29 DIAGNOSIS — Z86711 Personal history of pulmonary embolism: Secondary | ICD-10-CM | POA: Diagnosis not present

## 2015-11-29 DIAGNOSIS — E7800 Pure hypercholesterolemia, unspecified: Secondary | ICD-10-CM | POA: Diagnosis not present

## 2015-11-29 DIAGNOSIS — Z8674 Personal history of sudden cardiac arrest: Secondary | ICD-10-CM | POA: Diagnosis not present

## 2015-12-02 ENCOUNTER — Ambulatory Visit (INDEPENDENT_AMBULATORY_CARE_PROVIDER_SITE_OTHER): Payer: 59 | Admitting: Nurse Practitioner

## 2015-12-02 ENCOUNTER — Encounter: Payer: Self-pay | Admitting: Nurse Practitioner

## 2015-12-02 ENCOUNTER — Other Ambulatory Visit: Payer: Self-pay | Admitting: *Deleted

## 2015-12-02 ENCOUNTER — Other Ambulatory Visit: Payer: Self-pay | Admitting: Nurse Practitioner

## 2015-12-02 ENCOUNTER — Encounter: Payer: Self-pay | Admitting: *Deleted

## 2015-12-02 VITALS — BP 96/60 | HR 59 | Ht 63.0 in | Wt 177.9 lb

## 2015-12-02 DIAGNOSIS — E7800 Pure hypercholesterolemia, unspecified: Secondary | ICD-10-CM | POA: Diagnosis not present

## 2015-12-02 DIAGNOSIS — I4901 Ventricular fibrillation: Secondary | ICD-10-CM | POA: Diagnosis not present

## 2015-12-02 DIAGNOSIS — M549 Dorsalgia, unspecified: Secondary | ICD-10-CM | POA: Diagnosis not present

## 2015-12-02 DIAGNOSIS — I469 Cardiac arrest, cause unspecified: Secondary | ICD-10-CM

## 2015-12-02 DIAGNOSIS — I214 Non-ST elevation (NSTEMI) myocardial infarction: Secondary | ICD-10-CM

## 2015-12-02 DIAGNOSIS — Z8674 Personal history of sudden cardiac arrest: Secondary | ICD-10-CM | POA: Diagnosis not present

## 2015-12-02 DIAGNOSIS — R570 Cardiogenic shock: Secondary | ICD-10-CM | POA: Diagnosis not present

## 2015-12-02 DIAGNOSIS — Z86718 Personal history of other venous thrombosis and embolism: Secondary | ICD-10-CM | POA: Diagnosis not present

## 2015-12-02 DIAGNOSIS — I5189 Other ill-defined heart diseases: Secondary | ICD-10-CM | POA: Diagnosis not present

## 2015-12-02 DIAGNOSIS — G43909 Migraine, unspecified, not intractable, without status migrainosus: Secondary | ICD-10-CM | POA: Diagnosis not present

## 2015-12-02 DIAGNOSIS — Z86711 Personal history of pulmonary embolism: Secondary | ICD-10-CM | POA: Diagnosis not present

## 2015-12-02 DIAGNOSIS — M1711 Unilateral primary osteoarthritis, right knee: Secondary | ICD-10-CM | POA: Diagnosis not present

## 2015-12-02 LAB — CBC
HCT: 35.5 % (ref 35.0–45.0)
Hemoglobin: 11.7 g/dL (ref 11.7–15.5)
MCH: 28.6 pg (ref 27.0–33.0)
MCHC: 33 g/dL (ref 32.0–36.0)
MCV: 86.8 fL (ref 80.0–100.0)
MPV: 9.6 fL (ref 7.5–12.5)
Platelets: 619 10*3/uL — ABNORMAL HIGH (ref 140–400)
RBC: 4.09 MIL/uL (ref 3.80–5.10)
RDW: 15.4 % — ABNORMAL HIGH (ref 11.0–15.0)
WBC: 7.8 10*3/uL (ref 3.8–10.8)

## 2015-12-02 LAB — HEPATIC FUNCTION PANEL
ALT: 58 U/L — ABNORMAL HIGH (ref 6–29)
AST: 32 U/L (ref 10–35)
Albumin: 4.3 g/dL (ref 3.6–5.1)
Alkaline Phosphatase: 62 U/L (ref 33–130)
Bilirubin, Direct: 0.1 mg/dL (ref <=0.2)
Indirect Bilirubin: 0.4 mg/dL (ref 0.2–1.2)
Total Bilirubin: 0.5 mg/dL (ref 0.2–1.2)
Total Protein: 7.2 g/dL (ref 6.1–8.1)

## 2015-12-02 LAB — APTT: aPTT: 27 s (ref 22–34)

## 2015-12-02 LAB — PROTIME-INR
INR: 1.1
Prothrombin Time: 12.1 s — ABNORMAL HIGH (ref 9.0–11.5)

## 2015-12-02 LAB — BASIC METABOLIC PANEL
BUN: 25 mg/dL (ref 7–25)
CO2: 28 mmol/L (ref 20–31)
Calcium: 9.5 mg/dL (ref 8.6–10.4)
Chloride: 97 mmol/L — ABNORMAL LOW (ref 98–110)
Creat: 1.06 mg/dL — ABNORMAL HIGH (ref 0.50–0.99)
Glucose, Bld: 102 mg/dL — ABNORMAL HIGH (ref 65–99)
Potassium: 5 mmol/L (ref 3.5–5.3)
Sodium: 135 mmol/L (ref 135–146)

## 2015-12-02 LAB — BRAIN NATRIURETIC PEPTIDE: Brain Natriuretic Peptide: 170.6 pg/mL — ABNORMAL HIGH (ref <100)

## 2015-12-02 MED ORDER — NITROGLYCERIN 0.4 MG SL SUBL: 0.4000 mg | SUBLINGUAL_TABLET | SUBLINGUAL | Status: DC | PRN

## 2015-12-02 MED FILL — NITROGLYCERIN 0.4 MG TAB SL: 0.4 | 13 days supply | Qty: 25 | Fill #0

## 2015-12-02 NOTE — Patient Instructions (Addendum)
We will be checking the following labs today - BMET, CBC, HPF, PT, PTT   Medication Instructions:    Continue with your current medicines. BUT  STOP Voltaren  I sent in a RX for NTG tablets    Testing/Procedures To Be Arranged:  Repeat cardiac catheterization with possible stent  Follow-Up:   See me on July 10th     Other Special Instructions:  Your provider has recommended a cardiac catherization  You are scheduled for a cardiac catheterization on Wednesday, June 28th at 8:30 Am with Dr. Martinique or associate.  Go to [facility] 2nd Floor Short Stay on Wednesday, June 28th at 6:30 AM.  Enter thru the [facility] entrance A No food or drink after midnight on Tuesday. You may take your medications with a sip of water on the day of your procedure.   Coronary Angiogram A coronary angiogram, also called coronary angiography, is an X-ray procedure used to look at the arteries in the heart. In this procedure, a dye (contrast dye) is injected through a long, hollow tube (catheter). The catheter is about the size of a piece of cooked spaghetti and is inserted through your groin, wrist, or arm. The dye is injected into each artery, and X-rays are then taken to show if there is a blockage in the arteries of your heart.  LET [facility] CARE PROVIDER KNOW ABOUT: Any allergies you have, including allergies to shellfish or contrast dye.  All medicines you are taking, including vitamins, herbs, eye drops, creams, and over-the-counter medicines.  Previous problems you or members of your family have had with the use of anesthetics.  Any blood disorders you have.  Previous surgeries you have had. History of kidney problems or failure.  Other medical conditions you have.  RISKS AND COMPLICATIONS  Generally, a coronary angiogram is a safe procedure. However, about 1 person out of 1000 can have problems that may include: Allergic reaction to the dye. Bleeding/bruising from the access  site or other locations. Kidney injury, especially in people with impaired kidney function. Stroke (rare). Heart attack (rare). Irregular rhythms (rare) Death (rare)  BEFORE THE PROCEDURE  Do not eat or drink anything after midnight the night before the procedure or as directed by your health care provider.  Ask your health care provider about changing or stopping your regular medicines. This is especially important if you are taking diabetes medicines or blood thinners.  PROCEDURE You may be given a medicine to help you relax (sedative) before the procedure. This medicine is given through an intravenous (IV) access tube that is inserted into one of your veins.  The area where the catheter will be inserted will be washed and shaved. This is usually done in the groin but may be done in the fold of your arm (near your elbow) or in the wrist.  A medicine will be given to numb the area where the catheter will be inserted (local anesthetic).  The health care provider will insert the catheter into an artery. The catheter will be guided by using a special type of X-ray (fluoroscopy) of the blood vessel being examined.  A special dye will then be injected into the catheter, and X-rays will be taken. The dye will help to show where any narrowing or blockages are located in the heart arteries.    AFTER THE PROCEDURE  If the procedure is done through the leg, you will be kept in bed lying flat for several hours. You will be instructed to  not bend or cross your legs. The insertion site will be checked frequently.  The pulse in your feet or wrist will be checked frequently.  Additional blood tests, X-rays, and an electrocardiogram may be done.     If you need a refill on your cardiac medications before your next appointment, please call your pharmacy.   Call the [place] office at [phone] if you have any questions, problems or concerns.

## 2015-12-02 NOTE — Progress Notes (Signed)
CARDIOLOGY OFFICE NOTE  Date:  12/02/2015    Brittany Mason Date of Birth: [birth year] Medical Record [id number]  PCP:  Gennette Pac, MD  Cardiologist:  Gwenlyn Found but wanting to switch to Dr. Radford Pax  Chief Complaint  Patient presents with  . Coronary Artery Disease  . Congestive Heart Failure    Post hospital visit - seen for Dr. Gwenlyn Found    History of Present Illness: Brittany Mason is a 65 y.o. female who presents today for a TOC visit. Seen for Dr. Gwenlyn Found.   She has a history of pulmonary embolism on prior chronic anticoagulation (in the setting of HRT), mitral valve prolapse, hypothyroidism, hypercholesterolemia, and restless leg syndrome who presented earlier this month with VF cardiac arrest at home - with 16 minutes of down-time. Cath on 6/14 showing total occlusion of prox circumflex (conservative management favored at that time due to cooling protocol and further neurologic recovery).   She was discharged 11/25/2015.     She was last seen here back in March with atypical chest pain - had a normal stress test back in March of 2017. Strong + FH for CAD.   Comes in today. Here with her "best friend". Her name is Brittany Mason. She notes she has done ok for the past week. Feels more fatigue than anything. No chest pain. Some shortness of breath. She is on NSAID for back pain - was not told to stop but pharmacy noted an interaction. She is wanting NTG tablets and not spray - she has not used. She is asking about when to go to cardiac rehab. Asking about PCI/repeat cath. Brittany Mason notes that there are still issues with short term memory but it is improving.  She is not staying by herself. Having issues with the paperwork with [employer]. She is wanting to switch from Dr. Gwenlyn Found to Dr. Radford Pax. Her weight is stable at home. No swelling. Some bruising in her chest from the "thumper" with EMS but otherwise no pain.   Past Medical History  Diagnosis Date  . Hypothyroidism   . Hypercholesterolemia    . RLS (restless legs syndrome)   . PONV (postoperative nausea and vomiting)   . Chronic back pain   . MVP (mitral valve prolapse)     asymptomatic  . Dyspnea on exertion   . PE (pulmonary embolism) Apr 18, 2013    tx. -using Xarelto now, no further lung problems, denies SOB on 01-02-14  . Headache(784.0)     migraines, 1-2 every 2 months  . Arthritis     osteoarthritis-knees. Chronic back pain  . DVT (deep venous thrombosis) ([place])     01-02-14 hx. left lower leg, resulted in Pulmonary emboli on hormone therapy  . Acute pulmonary embolism ([place]) 06/20/2015    Past Surgical History  Procedure Laterality Date  . Abdominal hysterectomy      TAH/BSO  . Knee surgery Left '76 AND '81    ACL....   . Colonoscopy  09/28/2011    Procedure: COLONOSCOPY;  Surgeon: Juanita Craver, MD;  Location: [facility];  Service: Endoscopy;  Laterality: N/A;  . Total knee arthroplasty Left 01/08/2014    Procedure: LEFT TOTAL KNEE ARTHROPLASTY;  Surgeon: Gearlean Alf, MD;  Location: [facility];  Service: Orthopedics;  Laterality: Left;  . Cardiac catheterization N/A 11/20/2015    Procedure: Left Heart Cath and Coronary Angiography;  Surgeon: Belva Crome, MD;  Location: [facility];  Service: Cardiovascular;  Laterality: N/A;  Medications: Current Outpatient Prescriptions  Medication Sig Dispense Refill  . aspirin 81 MG tablet Take 81 mg by mouth daily.    Marland Kitchen atorvastatin (LIPITOR) 80 MG tablet 1 tablet (80 mg total) by Per NG tube route daily at 6 PM. 30 tablet 2  . butalbital-acetaminophen-caffeine (FIORICET) 50-325-40 MG tablet Take 1-2 tablets by mouth every 6 (six) hours as needed for headache. 20 tablet 0  . clopidogrel (PLAVIX) 75 MG tablet Take 1 tablet (75 mg total) by mouth daily. 30 tablet 2  . cyclobenzaprine (FLEXERIL) 5 MG tablet Take 5 mg by mouth 3 (three) times daily.   1  . diclofenac (VOLTAREN) 75 MG EC tablet Take 75 mg by mouth daily.  3  . escitalopram (LEXAPRO) 20 MG tablet  Take 20 mg by mouth every morning.     . fenofibrate 160 MG tablet Take 160 mg by mouth daily. Takes at bedtime    . furosemide (LASIX) 40 MG tablet Take 1 tablet (40 mg total) by mouth daily. 30 tablet 2  . levothyroxine (SYNTHROID, LEVOTHROID) 150 MCG tablet Take 150 mcg by mouth daily before breakfast.     . lisinopril (PRINIVIL,ZESTRIL) 2.5 MG tablet Take 1 tablet (2.5 mg total) by mouth daily. 30 tablet 2  . metoprolol succinate (TOPROL-XL) 25 MG 24 hr tablet Take 1 tablet (25 mg total) by mouth 2 (two) times daily with a meal. 60 tablet 2  . morphine (MSIR) 30 MG tablet Take 30 mg by mouth every 12 (twelve) hours.    Marland Kitchen omega-3 acid ethyl esters (LOVAZA) 1 G capsule Take 2 g by mouth 2 (two) times daily.    Marland Kitchen OVER THE COUNTER MEDICATION Take 1 capsule by mouth daily. "Relizen" herbal supplement for hormone replacement    . potassium chloride SA (K-DUR,KLOR-CON) 20 MEQ tablet Take 2 tablets (40 mEq total) by mouth daily. 60 tablet 2  . rOPINIRole (REQUIP) 0.5 MG tablet Take 0.5 mg by mouth every evening.     Marland Kitchen spironolactone (ALDACTONE) 25 MG tablet Take 1 tablet (25 mg total) by mouth daily. 30 tablet 2  . nitroGLYCERIN (NITROSTAT) 0.4 MG SL tablet Place 1 tablet (0.4 mg total) under the tongue every 5 (five) minutes as needed for chest pain. 25 tablet 3   No current facility-administered medications for this visit.    Allergies: Allergies  Allergen Reactions  . Codeine Nausea And Vomiting    Takes hydrocodone; if takes doses too close together, states "violently throws up"  . Erythromycin Hives and Itching    Social History: The patient  reports that she has never smoked. She has never used smokeless tobacco. She reports that she drinks alcohol. She reports that she does not use illicit drugs.   Family History: [name] family history includes Breast cancer in her maternal aunt and paternal aunt; Cancer in her maternal aunt; Congestive Heart Failure in her mother; Coronary  artery disease in her father; Hypertension in her father and mother; Stroke in her brother.   Review of Systems: Please see the history of present illness.   Otherwise, the review of systems is positive for none.   All other systems are reviewed and negative.   Physical Exam: VS:  BP 96/60 mmHg  Pulse 59  Ht 5\' 3"  (1.6 m)  Wt 177 lb 13.9 oz (80.681 kg)  BMI 31.52 kg/m2 .  BMI Body mass index is 31.52 kg/(m^2).  Wt Readings from Last 3 Encounters:  12/02/15 177 lb 13.9 oz (80.681 kg)  11/24/15 194 lb (87.998 kg)  09/04/15 189 lb (85.73 kg)    General: Pleasant. She is alert. She is very appropriate and in no acute distress.  HEENT: Normal. Neck: Supple, no JVD, carotid bruits, or masses noted.  Cardiac: Regular rate and rhythm. No murmurs, rubs, or gallops. No edema. Chest with some mild bruising. Respiratory:  Lungs are clear to auscultation bilaterally with normal work of breathing.  GI: Soft and nontender.  MS: No deformity or atrophy. Gait and ROM intact. Skin: Warm and dry. Color is normal.  Neuro:  Strength and sensation are intact and no gross focal deficits noted.  Psych: Alert, appropriate and with normal affect.   LABORATORY DATA:  EKG:  EKG is ordered today. This demonstrates sinus bradycardia.  Lab Results  Component Value Date   WBC 8.0 11/23/2015   HGB 9.5* 11/23/2015   HCT 30.4* 11/23/2015   PLT 255 11/23/2015   GLUCOSE 109* 11/25/2015   CHOL 181 11/19/2015   TRIG 186* 11/21/2015   HDL 66 11/19/2015   LDLCALC 94 11/19/2015   ALT 96* 11/19/2015   AST 202* 11/19/2015   NA 141 11/25/2015   K 3.4* 11/25/2015   CL 104 11/25/2015   CREATININE 0.68 11/25/2015   BUN 10 11/25/2015   CO2 29 11/25/2015   TSH 5.260* 11/19/2015   INR 1.24 11/21/2015   HGBA1C 5.7* 11/19/2015    BNP (last 3 results)  Recent Labs  11/19/15 2335  BNP 80.0    ProBNP (last 3 results) No results for input(s): PROBNP in the last 8760 hours.    cyclic troponin values  peaked to 45.60.   Other Studies Reviewed Today: Cardiac Catheterization: 11/20/2015 1. Prox RCA to Dist RCA lesion, 45% stenosed. 2. RPDA lesion, 65% stenosed. 3. Prox Cx to Mid Cx lesion, 100% stenosed.   Total occlusion (Acute vs Chronic) of the proximal circumflex with collaterals from left to left and right to left. The collaterals appear to be meager however, the patient is on the cooling protocol and I suspect that collateralization will significantly improved once the patient is back to body temperature. If angina develops post arrest, recanalization of the circumflex coronary artery would be reasonable.  Diffuse moderate stenosis in a large PDA, up to 70% obstruction. Relatively small distribution LAD that reaches, but does not wrap around, the left ventricular apex. No significant obstruction is noted in the LAD.  Mid inferior wall moderate hypokinesis. Estimated ejection fraction 45-50%. Marked elevation in left ventricular end-diastolic pressure of 40 mmHg secondary to acute ischemia and cooling.   Recommendations:  Conservative medical management for the time being.  I would add Plavix to the patient's medical regimen.  Should the patient have a significant neurological recovery, angina/ongoing evidence of ischemia could be treated with circumflex PCI. We chose against PCI today given cooling, vasoconstriction related to cooling, and clinical stability at the present time without evidence of ongoing ischemia.   Echocardiogram: 11/20/2015 Study Conclusions  - Left ventricle: The cavity size was normal. There was mild focal  basal hypertrophy of the septum. Systolic function was moderately  reduced. The estimated ejection fraction was in the range of 35%  to 40%. Diffuse hypokinesis. There is akinesis of the  mid-apicalinferolateral and inferior myocardium. Doppler  parameters are consistent with abnormal left ventricular  relaxation (grade 1 diastolic  dysfunction). - Aortic valve: Trileaflet; mildly thickened, mildly calcified  leaflets. There was mild regurgitation directed eccentrically in  the LVOT. - Aorta: Aortic root dimension: 38 mm (  ED). - Ascending aorta: The ascending aorta was mildly dilated. - Mitral valve: Calcified annulus. - Right ventricle: The cavity size was moderately dilated. Wall  thickness was normal. Systolic function was moderately reduced.  Assessment/Plan: 1. V-fib Cardiac Arrest/ NSTEMI - with LV dysfunction -  Cath on 6/14 showed total occlusion of prox circumflex (conservative management favored at that time due to cooling protocol and to assess for further neurologic recovery).  Could consider re-look cath prior to discharge as the pCx was not intervened on due to her acute state at the time of initial cath. This was not performed prior to her discharge. She is doing ok clinically except for some mild shortness of breath - this was present prior to the arrest. Have discussed with Dr. Rayann Heman (DOD) - he would like to proceed on with setting her up for repeat cath and possible PCI to the LCX. Also offering Life Vest. Cardiac cath arranged for Wednesday with Dr. Martinique. Holding on cardiac rehab for now. I will see her back for her post hospital visit.   The patient understands that risks include but are not limited to stroke (1 in 1000), death (1 in 59), kidney failure [usually temporary] (1 in 500), bleeding (1 in 200), allergic reaction [possibly serious] (1 in 200), and agrees to proceed.   2. Acute Systolic CHF - echo this admission showed an EF of 35-40% with diffuse HK and akinesis of the mid-apical inferolateral and inferior myocardium. She is on good medical therapy - but needs to stop the NSAID. Ohiopyle lab today. I have discussed possible Life Vest - she is willing to wear if insurance accepts.  Order sent.   3. Hypokalemia  Rechecking her lab today.   4. HLD - now on statin therapy  5. Elevated  LFTs - needs rechecking - would avoid Tylenol now until recheck.   6. Short term memory issues - her friend that is with her today notes this is still an issue but improving. She has good supportive care around the clock.   Current medicines are reviewed with the patient today.  The patient does not have concerns regarding medicines other than what has been noted above.  The following changes have been made:  See above.  Labs/ tests ordered today include:    Orders Placed This Encounter  Procedures  . Brain natriuretic peptide  . Basic metabolic panel  . CBC  . Hepatic function panel  . Protime-INR  . APTT  . EKG 12-Lead     Disposition:   FU with me arranged for her post hospital. Will try to go ahead and arrange for new patient visit with Dr. Radford Pax who she is wanting to establish with going forward. I can Armed forces technical officer her with Dr. Radford Pax.   Patient is agreeable to this plan and will call if any problems develop in the interim.   Signed: Burtis Junes, RN, ANP-C 12/02/2015 10:25 AM  [facility] [address] [place], [place]  [address] Phone: [phone] Fax: [phone]

## 2015-12-02 NOTE — Patient Outreach (Signed)
[facility]) Care Management  12/02/2015  SHADEN GULDAN Jul 07, 1950 [id number]  Subjective: Telephone call to patient's home number, spoke with patient, and HIPAA verified.  Patient gave verbal authorization to speak with friend Franco Collet) and sister Sharmeen Kalmbach) regarding her healthcare needs as needed.  Discussed [facility] Care Management transition of care follow up and care coordination services.    Patient in agreement to complete transition of care follow up. Patient states she had an appointment with nurse practitioner earlier today, realizes her heart is in bad shape, and is going to have a cardiac catheterization done on 12/04/15.  States she was advised to stop taking Voltaren.  States she has an appointment with primary MD on 12/05/15.   States she is currently receiving home health physical therapy with [facility].  States she has requested occupational therapy not start until next week due to cardiac cath on 12/04/15.   Patient states she does not have any care coordination, disease management, pharmacy, or transition of care needs at this time.  Patient in agreement to receiving [facility] Care Management information and EMMI educational materials.    Objective: Per chart review: Patient hospitalized 11/19/15 - 11/25/15 for cardiac arrest. Patient has a history of: Hypothyroidism, Cardiogenic shock, NSTEMI (non-ST elevated myocardial infarction), Acute respiratory failure with hypoxia, pulmonary embolism, AKI (acute kidney injury, Aspiration pneumonia, Hyperglycemia, Normocytic anemia, Restless legs syndrome, acute systolic heart failure, and Migraine headaches.   Assessment: Received [facility] Transition of care referral on 11/25/15. Transition of care follow up completed.  No further Telephonic RNCM needs at this time.    Plan: RNCM will send patient successful outreach letter, [facility] pamphlet, magnet, [facility] [employer] handout, and EMMI Eating [employer] handout. RNCM will send  case closure due to no care management needs/ program follow up completion request to [facility] [place] [facility].     Dashia Caldeira H. Annia Friendly, BSN, [place] [facility] Telephonic CM Phone: [phone] Fax: [phone]

## 2015-12-03 ENCOUNTER — Other Ambulatory Visit: Payer: Self-pay | Admitting: *Deleted

## 2015-12-04 ENCOUNTER — Ambulatory Visit (HOSPITAL_COMMUNITY)
Admission: RE | Admit: 2015-12-04 | Discharge: 2015-12-05 | Disposition: A | Discharge status: Home / Self Care | Payer: 59 | Source: Ambulatory Visit | Attending: Cardiology | Admitting: Cardiology

## 2015-12-04 ENCOUNTER — Encounter (HOSPITAL_COMMUNITY): Payer: Self-pay | Admitting: Cardiology

## 2015-12-04 ENCOUNTER — Encounter (HOSPITAL_COMMUNITY)
Admission: RE | Disposition: A | Discharge status: Home / Self Care | Payer: Self-pay | Source: Ambulatory Visit | Attending: Cardiology

## 2015-12-04 DIAGNOSIS — E7800 Pure hypercholesterolemia, unspecified: Secondary | ICD-10-CM | POA: Diagnosis not present

## 2015-12-04 DIAGNOSIS — Z86711 Personal history of pulmonary embolism: Secondary | ICD-10-CM | POA: Diagnosis not present

## 2015-12-04 DIAGNOSIS — Z79899 Other long term (current) drug therapy: Secondary | ICD-10-CM | POA: Insufficient documentation

## 2015-12-04 DIAGNOSIS — E039 Hypothyroidism, unspecified: Secondary | ICD-10-CM | POA: Diagnosis not present

## 2015-12-04 DIAGNOSIS — I5022 Chronic systolic (congestive) heart failure: Secondary | ICD-10-CM | POA: Diagnosis not present

## 2015-12-04 DIAGNOSIS — Z8674 Personal history of sudden cardiac arrest: Secondary | ICD-10-CM | POA: Insufficient documentation

## 2015-12-04 DIAGNOSIS — I2510 Atherosclerotic heart disease of native coronary artery without angina pectoris: Secondary | ICD-10-CM | POA: Insufficient documentation

## 2015-12-04 DIAGNOSIS — I255 Ischemic cardiomyopathy: Secondary | ICD-10-CM | POA: Diagnosis present

## 2015-12-04 DIAGNOSIS — I252 Old myocardial infarction: Secondary | ICD-10-CM | POA: Insufficient documentation

## 2015-12-04 DIAGNOSIS — G2581 Restless legs syndrome: Secondary | ICD-10-CM | POA: Insufficient documentation

## 2015-12-04 DIAGNOSIS — Z7982 Long term (current) use of aspirin: Secondary | ICD-10-CM | POA: Diagnosis not present

## 2015-12-04 DIAGNOSIS — I959 Hypotension, unspecified: Secondary | ICD-10-CM | POA: Diagnosis not present

## 2015-12-04 DIAGNOSIS — Z96652 Presence of left artificial knee joint: Secondary | ICD-10-CM | POA: Diagnosis not present

## 2015-12-04 DIAGNOSIS — Z7901 Long term (current) use of anticoagulants: Secondary | ICD-10-CM | POA: Insufficient documentation

## 2015-12-04 DIAGNOSIS — Z79891 Long term (current) use of opiate analgesic: Secondary | ICD-10-CM | POA: Insufficient documentation

## 2015-12-04 DIAGNOSIS — I2582 Chronic total occlusion of coronary artery: Secondary | ICD-10-CM | POA: Insufficient documentation

## 2015-12-04 DIAGNOSIS — I110 Hypertensive heart disease with heart failure: Secondary | ICD-10-CM | POA: Insufficient documentation

## 2015-12-04 DIAGNOSIS — Z8249 Family history of ischemic heart disease and other diseases of the circulatory system: Secondary | ICD-10-CM | POA: Diagnosis not present

## 2015-12-04 DIAGNOSIS — I469 Cardiac arrest, cause unspecified: Secondary | ICD-10-CM | POA: Diagnosis present

## 2015-12-04 DIAGNOSIS — I214 Non-ST elevation (NSTEMI) myocardial infarction: Secondary | ICD-10-CM | POA: Diagnosis present

## 2015-12-04 HISTORY — DX: Basal cell carcinoma of skin of right upper limb, including shoulder: C44.612

## 2015-12-04 HISTORY — PX: CARDIAC CATHETERIZATION: SHX172

## 2015-12-04 HISTORY — DX: Migraine, unspecified, not intractable, without status migrainosus: G43.909

## 2015-12-04 HISTORY — DX: Sacrococcygeal disorders, not elsewhere classified: M53.3

## 2015-12-04 HISTORY — DX: Acute myocardial infarction, unspecified: I21.9

## 2015-12-04 HISTORY — DX: Other chronic pain: G89.29

## 2015-12-04 LAB — POCT ACTIVATED CLOTTING TIME: ACTIVATED CLOTTING TIME: 422 s

## 2015-12-04 SURGERY — LEFT HEART CATH AND CORONARY ANGIOGRAPHY

## 2015-12-04 MED ORDER — MORPHINE SULFATE 15 MG PO TABS
30.0000 mg | ORAL_TABLET | Freq: Two times a day (BID) | ORAL | Status: DC
  Administered 2015-12-05: 09:00:00 30 mg via ORAL
  Filled 2015-12-04 (×2): qty 2

## 2015-12-04 MED ORDER — BUTALBITAL-APAP-CAFFEINE 50-325-40 MG PO TABS: 1.0000 | ORAL_TABLET | Freq: Four times a day (QID) | ORAL | Status: DC | PRN

## 2015-12-04 MED ORDER — ROPINIROLE HCL 0.5 MG PO TABS
0.5000 mg | ORAL_TABLET | Freq: Every evening | ORAL | Status: DC
  Administered 2015-12-04: 21:00:00 0.5 mg via ORAL
  Filled 2015-12-04: qty 1

## 2015-12-04 MED ORDER — LIDOCAINE HCL (PF) 1 % IJ SOLN
INTRAMUSCULAR | Status: DC | PRN
  Administered 2015-12-04: 5 mL

## 2015-12-04 MED ORDER — SODIUM CHLORIDE 0.9% FLUSH
3.0000 mL | Freq: Two times a day (BID) | INTRAVENOUS | Status: DC
  Administered 2015-12-04: 21:00:00 3 mL via INTRAVENOUS

## 2015-12-04 MED ORDER — HEPARIN (PORCINE) IN NACL 2-0.9 UNIT/ML-% IJ SOLN
INTRAMUSCULAR | Status: AC
  Filled 2015-12-04: qty 1500

## 2015-12-04 MED ORDER — BIVALIRUDIN BOLUS VIA INFUSION - CUPID
INTRAVENOUS | Status: DC | PRN
  Administered 2015-12-04: 59.55 mg via INTRAVENOUS

## 2015-12-04 MED ORDER — NITROGLYCERIN 0.4 MG SL SUBL: 0.4000 mg | SUBLINGUAL_TABLET | SUBLINGUAL | Status: DC | PRN

## 2015-12-04 MED ORDER — IOPAMIDOL (ISOVUE-370) INJECTION 76%
INTRAVENOUS | Status: AC
  Filled 2015-12-04: qty 50

## 2015-12-04 MED ORDER — SODIUM CHLORIDE 0.9 % WEIGHT BASED INFUSION: 3.0000 mL/kg/h | INTRAVENOUS | Status: DC

## 2015-12-04 MED ORDER — ASPIRIN 81 MG PO CHEW
81.0000 mg | CHEWABLE_TABLET | ORAL | Status: AC
  Administered 2015-12-04: 81 mg via ORAL

## 2015-12-04 MED ORDER — HEART ATTACK BOUNCING BOOK
Freq: Once | Status: AC
  Administered 2015-12-04: 22:00:00
  Filled 2015-12-04: qty 1

## 2015-12-04 MED ORDER — ACETAMINOPHEN 325 MG PO TABS
650.0000 mg | ORAL_TABLET | ORAL | Status: DC | PRN
  Administered 2015-12-04: 17:00:00 650 mg via ORAL
  Filled 2015-12-04: qty 2

## 2015-12-04 MED ORDER — DIAZEPAM 5 MG PO TABS
ORAL_TABLET | ORAL | Status: AC
  Filled 2015-12-04: qty 1

## 2015-12-04 MED ORDER — FENTANYL CITRATE (PF) 100 MCG/2ML IJ SOLN
INTRAMUSCULAR | Status: AC
  Filled 2015-12-04: qty 2

## 2015-12-04 MED ORDER — ATORVASTATIN CALCIUM 80 MG PO TABS
80.0000 mg | ORAL_TABLET | Freq: Every day | ORAL | Status: DC
  Administered 2015-12-04: 80 mg via NASOGASTRIC
  Filled 2015-12-04: qty 1

## 2015-12-04 MED ORDER — ONDANSETRON HCL 4 MG/2ML IJ SOLN: 4.0000 mg | Freq: Four times a day (QID) | INTRAMUSCULAR | Status: DC | PRN

## 2015-12-04 MED ORDER — LIDOCAINE HCL (PF) 1 % IJ SOLN
INTRAMUSCULAR | Status: AC
  Filled 2015-12-04: qty 30

## 2015-12-04 MED ORDER — NITROGLYCERIN 1 MG/10 ML FOR IR/CATH LAB
INTRA_ARTERIAL | Status: AC
  Filled 2015-12-04: qty 10

## 2015-12-04 MED ORDER — ASPIRIN EC 81 MG PO TBEC
81.0000 mg | DELAYED_RELEASE_TABLET | Freq: Every day | ORAL | Status: DC
  Administered 2015-12-05: 09:00:00 81 mg via ORAL
  Filled 2015-12-04: qty 1

## 2015-12-04 MED ORDER — FENOFIBRATE 160 MG PO TABS
160.0000 mg | ORAL_TABLET | Freq: Every day | ORAL | Status: DC
  Administered 2015-12-04: 21:00:00 160 mg via ORAL
  Filled 2015-12-04: qty 1

## 2015-12-04 MED ORDER — VERAPAMIL HCL 2.5 MG/ML IV SOLN
INTRAVENOUS | Status: AC
  Filled 2015-12-04: qty 2

## 2015-12-04 MED ORDER — SODIUM CHLORIDE 0.9% FLUSH: 3.0000 mL | INTRAVENOUS | Status: DC | PRN

## 2015-12-04 MED ORDER — ASPIRIN 81 MG PO CHEW
CHEWABLE_TABLET | ORAL | Status: AC
  Filled 2015-12-04: qty 1

## 2015-12-04 MED ORDER — IOPAMIDOL (ISOVUE-370) INJECTION 76%
INTRAVENOUS | Status: DC | PRN
  Administered 2015-12-04: 220 mL via INTRA_ARTERIAL

## 2015-12-04 MED ORDER — LEVOTHYROXINE SODIUM 75 MCG PO TABS
150.0000 ug | ORAL_TABLET | Freq: Every day | ORAL | Status: DC
  Administered 2015-12-05: 07:00:00 150 ug via ORAL
  Filled 2015-12-04: qty 2

## 2015-12-04 MED ORDER — BIVALIRUDIN 250 MG IV SOLR
INTRAVENOUS | Status: AC
  Filled 2015-12-04: qty 250

## 2015-12-04 MED ORDER — FENTANYL CITRATE (PF) 100 MCG/2ML IJ SOLN
INTRAMUSCULAR | Status: DC | PRN
  Administered 2015-12-04 (×2): 25 ug via INTRAVENOUS

## 2015-12-04 MED ORDER — LISINOPRIL 5 MG PO TABS: 2.5000 mg | ORAL_TABLET | Freq: Every day | ORAL | Status: DC

## 2015-12-04 MED ORDER — SODIUM CHLORIDE 0.9 % IV SOLN: 250.0000 mL | INTRAVENOUS | Status: DC | PRN

## 2015-12-04 MED ORDER — MIDAZOLAM HCL 2 MG/2ML IJ SOLN
INTRAMUSCULAR | Status: DC | PRN
  Administered 2015-12-04 (×2): 1 mg via INTRAVENOUS

## 2015-12-04 MED ORDER — MIDAZOLAM HCL 2 MG/2ML IJ SOLN
INTRAMUSCULAR | Status: AC
  Filled 2015-12-04: qty 2

## 2015-12-04 MED ORDER — OMEGA-3-ACID ETHYL ESTERS 1 G PO CAPS
2.0000 g | ORAL_CAPSULE | Freq: Two times a day (BID) | ORAL | Status: DC
Start: 2015-12-04 — End: 2015-12-05
  Administered 2015-12-04 – 2015-12-05 (×2): 2 g via ORAL
  Filled 2015-12-04 (×2): qty 2

## 2015-12-04 MED ORDER — ANGIOPLASTY BOOK
Freq: Once | Status: AC
  Administered 2015-12-04: 22:00:00
  Filled 2015-12-04: qty 1

## 2015-12-04 MED ORDER — SPIRONOLACTONE 25 MG PO TABS: 25.0000 mg | ORAL_TABLET | Freq: Every day | ORAL | Status: DC

## 2015-12-04 MED ORDER — LIVING BETTER WITH HEART FAILURE BOOK
Freq: Once | Status: AC
  Administered 2015-12-04: 22:00:00

## 2015-12-04 MED ORDER — ESCITALOPRAM OXALATE 20 MG PO TABS
20.0000 mg | ORAL_TABLET | Freq: Every day | ORAL | Status: DC
  Administered 2015-12-05: 09:00:00 20 mg via ORAL
  Filled 2015-12-04: qty 1

## 2015-12-04 MED ORDER — DIAZEPAM 5 MG PO TABS
5.0000 mg | ORAL_TABLET | ORAL | Status: AC
  Administered 2015-12-04: 5 mg via ORAL

## 2015-12-04 MED ORDER — SODIUM CHLORIDE 0.9 % IV SOLN
INTRAVENOUS | Status: DC
  Administered 2015-12-04: 08:00:00 via INTRAVENOUS

## 2015-12-04 MED ORDER — HEPARIN SODIUM (PORCINE) 1000 UNIT/ML IJ SOLN
INTRAMUSCULAR | Status: AC
  Filled 2015-12-04: qty 1

## 2015-12-04 MED ORDER — HEPARIN (PORCINE) IN NACL 2-0.9 UNIT/ML-% IJ SOLN
INTRAMUSCULAR | Status: DC | PRN
  Administered 2015-12-04: 1500 mL

## 2015-12-04 MED ORDER — METOPROLOL SUCCINATE ER 25 MG PO TB24: 25.0000 mg | ORAL_TABLET | Freq: Two times a day (BID) | ORAL | Status: DC

## 2015-12-04 MED ORDER — IOPAMIDOL (ISOVUE-370) INJECTION 76%
INTRAVENOUS | Status: AC
  Filled 2015-12-04: qty 100

## 2015-12-04 MED ORDER — CLOPIDOGREL BISULFATE 75 MG PO TABS
75.0000 mg | ORAL_TABLET | Freq: Every day | ORAL | Status: DC
  Administered 2015-12-05: 75 mg via ORAL
  Filled 2015-12-04: qty 1

## 2015-12-04 MED ORDER — SODIUM CHLORIDE 0.9 % IV SOLN
250.0000 mg | INTRAVENOUS | Status: DC | PRN
  Administered 2015-12-04: 1.75 mg/kg/h via INTRAVENOUS

## 2015-12-04 MED ORDER — SODIUM CHLORIDE 0.9 % IV BOLUS (SEPSIS)
250.0000 mL | Freq: Once | INTRAVENOUS | Status: AC
  Administered 2015-12-04: 250 mL via INTRAVENOUS

## 2015-12-04 MED ORDER — VERAPAMIL HCL 2.5 MG/ML IV SOLN
INTRAVENOUS | Status: DC | PRN
  Administered 2015-12-04: 09:00:00 via INTRA_ARTERIAL

## 2015-12-04 MED ORDER — CYCLOBENZAPRINE HCL 10 MG PO TABS: 5.0000 mg | ORAL_TABLET | Freq: Three times a day (TID) | ORAL | Status: DC | PRN

## 2015-12-04 SURGICAL SUPPLY — 20 items
BALLN EMERGE MR 2.5X12 (BALLOONS) ×3
BALLOON EMERGE MR 2.5X12 (BALLOONS) IMPLANT
CATH INFINITI 5 FR JL3.5 (CATHETERS) ×1 IMPLANT
CATH INFINITI 5FR ANG PIGTAIL (CATHETERS) ×1 IMPLANT
CATH INFINITI JR4 5F (CATHETERS) ×1 IMPLANT
DEVICE RAD COMP TR BAND LRG (VASCULAR PRODUCTS) ×1 IMPLANT
GLIDESHEATH SLEND SS 6F .021 (SHEATH) ×1 IMPLANT
GUIDE CATH RUNWAY 6FR CLS3.5 (CATHETERS) ×1 IMPLANT
KIT ENCORE 26 ADVANTAGE (KITS) ×1 IMPLANT
KIT HEART LEFT (KITS) ×3 IMPLANT
PACK CARDIAC CATHETERIZATION (CUSTOM PROCEDURE TRAY) ×3 IMPLANT
STENT PROMUS PREM MR 3.0X20 (Permanent Stent) ×1 IMPLANT
SYR MEDRAD MARK V 150ML (SYRINGE) ×3 IMPLANT
TRANSDUCER W/STOPCOCK (MISCELLANEOUS) ×3 IMPLANT
TUBING CIL FLEX 10 FLL-RA (TUBING) ×3 IMPLANT
TUBING CONTRAST HIGH PRESS 20 (MISCELLANEOUS) ×1 IMPLANT
WIRE ASAHI FIELDER XT 190CM (WIRE) ×1 IMPLANT
WIRE ASAHI PROWATER 180CM (WIRE) ×1 IMPLANT
WIRE PT2 MS 185 (WIRE) ×1 IMPLANT
WIRE SAFE-T 1.5MM-J .035X260CM (WIRE) ×1 IMPLANT

## 2015-12-04 NOTE — Progress Notes (Signed)
Patient hypotensive but asymptomatic. She has received a liter of IVF today. Will hold aldactone, metoprolol, and lisinopril and monitor.  Rayaan Garguilo Martinique MD, [facility]

## 2015-12-04 NOTE — H&P (View-Only) (Signed)
CARDIOLOGY OFFICE NOTE  Date:  12/02/2015    Romona Mason Date of Birth: 11/21/1950 Medical Record [id number]  PCP:  Gennette Pac, MD  Cardiologist:  Gwenlyn Found but wanting to switch to Dr. Radford Pax  Chief Complaint  Patient presents with  . Coronary Artery Disease  . Congestive Heart Failure    Post hospital visit - seen for Dr. Gwenlyn Found    History of Present Illness: Brittany Mason is a 65 y.o. female who presents today for a TOC visit. Seen for Dr. Gwenlyn Found.   She has a history of pulmonary embolism on prior chronic anticoagulation (in the setting of HRT), mitral valve prolapse, hypothyroidism, hypercholesterolemia, and restless leg syndrome who presented earlier this month with VF cardiac arrest at home - with 16 minutes of down-time. Cath on 6/14 showing total occlusion of prox circumflex (conservative management favored at that time due to cooling protocol and further neurologic recovery).   She was discharged 11/25/2015.     She was last seen here back in March with atypical chest pain - had a normal stress test back in March of 2017. Strong + FH for CAD.   Comes in today. Here with her "best friend". Her name is Brittany Mason. She notes she has done ok for the past week. Feels more fatigue than anything. No chest pain. Some shortness of breath. She is on NSAID for back pain - was not told to stop but pharmacy noted an interaction. She is wanting NTG tablets and not spray - she has not used. She is asking about when to go to cardiac rehab. Asking about PCI/repeat cath. Brittany Mason notes that there are still issues with short term memory but it is improving.  She is not staying by herself. Having issues with the paperwork with [employer]. She is wanting to switch from Dr. Gwenlyn Found to Dr. Radford Pax. Her weight is stable at home. No swelling. Some bruising in her chest from the "thumper" with EMS but otherwise no pain.   Past Medical History  Diagnosis Date  . Hypothyroidism   . Hypercholesterolemia    . RLS (restless legs syndrome)   . PONV (postoperative nausea and vomiting)   . Chronic back pain   . MVP (mitral valve prolapse)     asymptomatic  . Dyspnea on exertion   . PE (pulmonary embolism) Apr 18, 2013    tx. -using Xarelto now, no further lung problems, denies SOB on 01-02-14  . Headache(784.0)     migraines, 1-2 every 2 months  . Arthritis     osteoarthritis-knees. Chronic back pain  . DVT (deep venous thrombosis) ([place])     01-02-14 hx. left lower leg, resulted in Pulmonary emboli on hormone therapy  . Acute pulmonary embolism ([place]) 06/20/2015    Past Surgical History  Procedure Laterality Date  . Abdominal hysterectomy      TAH/BSO  . Knee surgery Left '76 AND '81    ACL....   . Colonoscopy  09/28/2011    Procedure: COLONOSCOPY;  Surgeon: Juanita Craver, MD;  Location: [facility];  Service: Endoscopy;  Laterality: N/A;  . Total knee arthroplasty Left 01/08/2014    Procedure: LEFT TOTAL KNEE ARTHROPLASTY;  Surgeon: Gearlean Alf, MD;  Location: [facility];  Service: Orthopedics;  Laterality: Left;  . Cardiac catheterization N/A 11/20/2015    Procedure: Left Heart Cath and Coronary Angiography;  Surgeon: Belva Crome, MD;  Location: [place] [facility];  Service: Cardiovascular;  Laterality: N/A;  Medications: Current Outpatient Prescriptions  Medication Sig Dispense Refill  . aspirin 81 MG tablet Take 81 mg by mouth daily.    Brittany Mason atorvastatin (LIPITOR) 80 MG tablet 1 tablet (80 mg total) by Per NG tube route daily at 6 PM. 30 tablet 2  . butalbital-acetaminophen-caffeine (FIORICET) 50-325-40 MG tablet Take 1-2 tablets by mouth every 6 (six) hours as needed for headache. 20 tablet 0  . clopidogrel (PLAVIX) 75 MG tablet Take 1 tablet (75 mg total) by mouth daily. 30 tablet 2  . cyclobenzaprine (FLEXERIL) 5 MG tablet Take 5 mg by mouth 3 (three) times daily.   1  . diclofenac (VOLTAREN) 75 MG EC tablet Take 75 mg by mouth daily.  3  . escitalopram (LEXAPRO) 20 MG tablet  Take 20 mg by mouth every morning.     . fenofibrate 160 MG tablet Take 160 mg by mouth daily. Takes at bedtime    . furosemide (LASIX) 40 MG tablet Take 1 tablet (40 mg total) by mouth daily. 30 tablet 2  . levothyroxine (SYNTHROID, LEVOTHROID) 150 MCG tablet Take 150 mcg by mouth daily before breakfast.     . lisinopril (PRINIVIL,ZESTRIL) 2.5 MG tablet Take 1 tablet (2.5 mg total) by mouth daily. 30 tablet 2  . metoprolol succinate (TOPROL-XL) 25 MG 24 hr tablet Take 1 tablet (25 mg total) by mouth 2 (two) times daily with a meal. 60 tablet 2  . morphine (MSIR) 30 MG tablet Take 30 mg by mouth every 12 (twelve) hours.    Brittany Mason omega-3 acid ethyl esters (LOVAZA) 1 G capsule Take 2 g by mouth 2 (two) times daily.    Brittany Mason OVER THE COUNTER MEDICATION Take 1 capsule by mouth daily. "Relizen" herbal supplement for hormone replacement    . potassium chloride SA (K-DUR,KLOR-CON) 20 MEQ tablet Take 2 tablets (40 mEq total) by mouth daily. 60 tablet 2  . rOPINIRole (REQUIP) 0.5 MG tablet Take 0.5 mg by mouth every evening.     Brittany Mason spironolactone (ALDACTONE) 25 MG tablet Take 1 tablet (25 mg total) by mouth daily. 30 tablet 2  . nitroGLYCERIN (NITROSTAT) 0.4 MG SL tablet Place 1 tablet (0.4 mg total) under the tongue every 5 (five) minutes as needed for chest pain. 25 tablet 3   No current facility-administered medications for this visit.    Allergies: Allergies  Allergen Reactions  . Codeine Nausea And Vomiting    Takes hydrocodone; if takes doses too close together, states "violently throws up"  . Erythromycin Hives and Itching    Social History: The patient  reports that she has never smoked. She has never used smokeless tobacco. She reports that she drinks alcohol. She reports that she does not use illicit drugs.   Family History: [name] family history includes Breast cancer in her maternal aunt and paternal aunt; Cancer in her maternal aunt; Congestive Heart Failure in her mother; Coronary  artery disease in her father; Hypertension in her father and mother; Stroke in her brother.   Review of Systems: Please see the history of present illness.   Otherwise, the review of systems is positive for none.   All other systems are reviewed and negative.   Physical Exam: VS:  BP 96/60 mmHg  Pulse 59  Ht 5\' 3"  (1.6 m)  Wt 177 lb 13.9 oz (80.681 kg)  BMI 31.52 kg/m2 .  BMI Body mass index is 31.52 kg/(m^2).  Wt Readings from Last 3 Encounters:  12/02/15 177 lb 13.9 oz (80.681 kg)  11/24/15 194 lb (87.998 kg)  09/04/15 189 lb (85.73 kg)    General: Pleasant. She is alert. She is very appropriate and in no acute distress.  HEENT: Normal. Neck: Supple, no JVD, carotid bruits, or masses noted.  Cardiac: Regular rate and rhythm. No murmurs, rubs, or gallops. No edema. Chest with some mild bruising. Respiratory:  Lungs are clear to auscultation bilaterally with normal work of breathing.  GI: Soft and nontender.  MS: No deformity or atrophy. Gait and ROM intact. Skin: Warm and dry. Color is normal.  Neuro:  Strength and sensation are intact and no gross focal deficits noted.  Psych: Alert, appropriate and with normal affect.   LABORATORY DATA:  EKG:  EKG is ordered today. This demonstrates sinus bradycardia.  Lab Results  Component Value Date   WBC 8.0 11/23/2015   HGB 9.5* 11/23/2015   HCT 30.4* 11/23/2015   PLT 255 11/23/2015   GLUCOSE 109* 11/25/2015   CHOL 181 11/19/2015   TRIG 186* 11/21/2015   HDL 66 11/19/2015   LDLCALC 94 11/19/2015   ALT 96* 11/19/2015   AST 202* 11/19/2015   NA 141 11/25/2015   K 3.4* 11/25/2015   CL 104 11/25/2015   CREATININE 0.68 11/25/2015   BUN 10 11/25/2015   CO2 29 11/25/2015   TSH 5.260* 11/19/2015   INR 1.24 11/21/2015   HGBA1C 5.7* 11/19/2015    BNP (last 3 results)  Recent Labs  11/19/15 2335  BNP 80.0    ProBNP (last 3 results) No results for input(s): PROBNP in the last 8760 hours.    cyclic troponin values  peaked to 45.60.   Other Studies Reviewed Today: Cardiac Catheterization: 11/20/2015 1. Prox RCA to Dist RCA lesion, 45% stenosed. 2. RPDA lesion, 65% stenosed. 3. Prox Cx to Mid Cx lesion, 100% stenosed.   Total occlusion (Acute vs Chronic) of the proximal circumflex with collaterals from left to left and right to left. The collaterals appear to be meager however, the patient is on the cooling protocol and I suspect that collateralization will significantly improved once the patient is back to body temperature. If angina develops post arrest, recanalization of the circumflex coronary artery would be reasonable.  Diffuse moderate stenosis in a large PDA, up to 70% obstruction. Relatively small distribution LAD that reaches, but does not wrap around, the left ventricular apex. No significant obstruction is noted in the LAD.  Mid inferior wall moderate hypokinesis. Estimated ejection fraction 45-50%. Marked elevation in left ventricular end-diastolic pressure of 40 mmHg secondary to acute ischemia and cooling.   Recommendations:  Conservative medical management for the time being.  I would add Plavix to the patient's medical regimen.  Should the patient have a significant neurological recovery, angina/ongoing evidence of ischemia could be treated with circumflex PCI. We chose against PCI today given cooling, vasoconstriction related to cooling, and clinical stability at the present time without evidence of ongoing ischemia.   Echocardiogram: 11/20/2015 Study Conclusions  - Left ventricle: The cavity size was normal. There was mild focal  basal hypertrophy of the septum. Systolic function was moderately  reduced. The estimated ejection fraction was in the range of 35%  to 40%. Diffuse hypokinesis. There is akinesis of the  mid-apicalinferolateral and inferior myocardium. Doppler  parameters are consistent with abnormal left ventricular  relaxation (grade 1 diastolic  dysfunction). - Aortic valve: Trileaflet; mildly thickened, mildly calcified  leaflets. There was mild regurgitation directed eccentrically in  the LVOT. - Aorta: Aortic root dimension: 38 mm (  ED). - Ascending aorta: The ascending aorta was mildly dilated. - Mitral valve: Calcified annulus. - Right ventricle: The cavity size was moderately dilated. Wall  thickness was normal. Systolic function was moderately reduced.  Assessment/Plan: 1. V-fib Cardiac Arrest/ NSTEMI - with LV dysfunction -  Cath on 6/14 showed total occlusion of prox circumflex (conservative management favored at that time due to cooling protocol and to assess for further neurologic recovery).  Could consider re-look cath prior to discharge as the pCx was not intervened on due to her acute state at the time of initial cath. This was not performed prior to her discharge. She is doing ok clinically except for some mild shortness of breath - this was present prior to the arrest. Have discussed with Dr. Rayann Heman (DOD) - he would like to proceed on with setting her up for repeat cath and possible PCI to the LCX. Also offering Life Vest. Cardiac cath arranged for Wednesday with Dr. [place]. Holding on cardiac rehab for now. I will see her back for her post hospital visit.   The patient understands that risks include but are not limited to stroke (1 in 1000), death (1 in 36), kidney failure [usually temporary] (1 in 500), bleeding (1 in 200), allergic reaction [possibly serious] (1 in 200), and agrees to proceed.   2. Acute Systolic CHF - echo this admission showed an EF of 35-40% with diffuse HK and akinesis of the mid-apical inferolateral and inferior myocardium. She is on good medical therapy - but needs to stop the NSAID. National lab today. I have discussed possible Life Vest - she is willing to wear if insurance accepts.  Order sent.   3. Hypokalemia  Rechecking her lab today.   4. HLD - now on statin therapy  5. Elevated  LFTs - needs rechecking - would avoid Tylenol now until recheck.   6. Short term memory issues - her friend that is with her today notes this is still an issue but improving. She has good supportive care around the clock.   Current medicines are reviewed with the patient today.  The patient does not have concerns regarding medicines other than what has been noted above.  The following changes have been made:  See above.  Labs/ tests ordered today include:    Orders Placed This Encounter  Procedures  . Brain natriuretic peptide  . Basic metabolic panel  . CBC  . Hepatic function panel  . Protime-INR  . APTT  . EKG 12-Lead     Disposition:   FU with me arranged for her post hospital. Will try to go ahead and arrange for new patient visit with Dr. Radford Pax who she is wanting to establish with going forward. I can Armed forces technical officer her with Dr. Radford Pax.   Patient is agreeable to this plan and will call if any problems develop in the interim.   Signed: Burtis Junes, RN, ANP-C 12/02/2015 10:25 AM  [facility] [address] [place], [place]  [address] Phone: [phone] Fax: [phone]

## 2015-12-04 NOTE — Progress Notes (Signed)
TR BAND REMOVAL  LOCATION:    right radial  DEFLATED PER PROTOCOL:    Yes.    TIME BAND OFF / DRESSING APPLIED:    1430   SITE UPON ARRIVAL:    Level 0  SITE AFTER BAND REMOVAL:    Level 0  CIRCULATION SENSATION AND MOVEMENT:    Within Normal Limits   Yes.    COMMENTS:   Rechecked site at 1500 and frequently during shift with no change in assessment noted. Patient compliant with instructions given and extremity maintained on pillow with no twisting or flexing of wrist and patient not using extremity. Family assisting with meals and when needed.

## 2015-12-04 NOTE — Care Management Note (Signed)
Case Management Note  Patient Details  Name: Brittany Mason MRN: [id number] Date of Birth: 25-Oct-1950  Subjective/Objective:                 Patient DC's ~1wk ago for MI. Active with [facility] for [facility], no resumption orders needed. On ASA and Plavix, no changes in meds anticipated.   Action/Plan:  No CM needs identified at this time.  Expected Discharge Date:                  Expected Discharge Plan:  [place] (Active with [facility] Go PTA, no resumption orders needed)  In-House Referral:     Discharge planning Services  CM Consult  Post Acute Care Choice:  NA Choice offered to:     DME Arranged:    DME Agency:     HH Arranged:    HH Agency:     Status of Service:  Completed, signed off  If discussed at [employer] of Stay Meetings, dates discussed:    Additional Comments:  Carles Collet, RN 12/04/2015, 11:19 AM

## 2015-12-04 NOTE — Interval H&P Note (Signed)
History and Physical Interval Note:  12/04/2015 8:29 AM  Brittany Mason  has presented today for surgery, with the diagnosis of cad  The various methods of treatment have been discussed with the patient and family. After consideration of risks, benefits and other options for treatment, the patient has consented to  Procedure(s): Left Heart Cath and Coronary Angiography (N/A) as a surgical intervention .  [name] history has been reviewed, patient examined, no change in status, stable for surgery.  I have reviewed the patient's chart and labs.  Questions were answered to the patient's satisfaction.   Cath Lab Visit (complete for each Cath Lab visit)  Clinical Evaluation Leading to the Procedure:   ACS: Yes.    Non-ACS:    Anginal Classification: CCS I  Anti-ischemic medical therapy: Minimal Therapy (1 class of medications)  Non-Invasive Test Results: No non-invasive testing performed  Prior CABG: No previous CABG        Collier [facility] 12/04/2015 8:29 AM

## 2015-12-05 DIAGNOSIS — I255 Ischemic cardiomyopathy: Secondary | ICD-10-CM

## 2015-12-05 DIAGNOSIS — G2581 Restless legs syndrome: Secondary | ICD-10-CM | POA: Diagnosis not present

## 2015-12-05 DIAGNOSIS — I5022 Chronic systolic (congestive) heart failure: Secondary | ICD-10-CM | POA: Diagnosis not present

## 2015-12-05 DIAGNOSIS — I2510 Atherosclerotic heart disease of native coronary artery without angina pectoris: Secondary | ICD-10-CM | POA: Diagnosis not present

## 2015-12-05 DIAGNOSIS — E7800 Pure hypercholesterolemia, unspecified: Secondary | ICD-10-CM | POA: Diagnosis not present

## 2015-12-05 DIAGNOSIS — I214 Non-ST elevation (NSTEMI) myocardial infarction: Secondary | ICD-10-CM

## 2015-12-05 DIAGNOSIS — I2582 Chronic total occlusion of coronary artery: Secondary | ICD-10-CM | POA: Diagnosis not present

## 2015-12-05 DIAGNOSIS — E039 Hypothyroidism, unspecified: Secondary | ICD-10-CM | POA: Diagnosis not present

## 2015-12-05 DIAGNOSIS — I110 Hypertensive heart disease with heart failure: Secondary | ICD-10-CM | POA: Diagnosis not present

## 2015-12-05 DIAGNOSIS — I959 Hypotension, unspecified: Secondary | ICD-10-CM | POA: Diagnosis not present

## 2015-12-05 LAB — BASIC METABOLIC PANEL
ANION GAP: 5 (ref 5–15)
BUN: 14 mg/dL (ref 6–20)
CALCIUM: 9.2 mg/dL (ref 8.9–10.3)
CO2: 28 mmol/L (ref 22–32)
Chloride: 105 mmol/L (ref 101–111)
Creatinine, Ser: 0.8 mg/dL (ref 0.44–1.00)
GFR calc Af Amer: >60 mL/min (ref >60)
Glucose, Bld: 101 mg/dL — ABNORMAL HIGH (ref 65–99)
POTASSIUM: 4.3 mmol/L (ref 3.5–5.1)
Sodium: 138 mmol/L (ref 135–145)

## 2015-12-05 LAB — CBC
HEMATOCRIT: 35.5 % — AB (ref 36.0–46.0)
Hemoglobin: 11.2 g/dL — ABNORMAL LOW (ref 12.0–15.0)
MCH: 28.5 pg (ref 26.0–34.0)
MCHC: 31.5 g/dL (ref 30.0–36.0)
MCV: 90.3 fL (ref 78.0–100.0)
Platelets: 527 10*3/uL — ABNORMAL HIGH (ref 150–400)
RBC: 3.93 MIL/uL (ref 3.87–5.11)
RDW: 15.8 % — AB (ref 11.5–15.5)
WBC: 4.5 10*3/uL (ref 4.0–10.5)

## 2015-12-05 MED FILL — Nitroglycerin IV Soln 100 MCG/ML in D5W: INTRA_ARTERIAL | Qty: 10 | Status: AC

## 2015-12-05 MED FILL — Heparin Sodium (Porcine) Inj 1000 Unit/ML: INTRAMUSCULAR | Qty: 10 | Status: AC

## 2015-12-05 NOTE — Care Management Note (Signed)
Case Management Note  Patient Details  Name: Brittany Mason MRN: [id number] Date of Birth: July 25, 1950  Subjective/Objective:    Pt admitted with cardiomyopathy                Action/Plan:  PTA independent from home.  Pt already active with [facility] for PT,OT.  Resumption order written and [facility] agency informed of both admit and discharge.  Resumption referral accepted by agency.  NO other CM needs   Expected Discharge Date:                  Expected Discharge Plan:   (Active with [facility] Go PTA, no resumption orders needed)  In-House Referral:     Discharge planning Services  CM Consult  Post Acute Care Choice:  Resumption of Svcs/PTA Provider Choice offered to:  Patient  DME Arranged:    DME Agency:     HH Arranged:  PT, OT [place] Agency:  [facility] (now [facility])  Status of Service:  Completed, signed off  If discussed at [place] of Stay Meetings, dates discussed:    Additional Comments:  Maryclare Labrador, RN 12/05/2015, 9:19 AM

## 2015-12-05 NOTE — Discharge Summary (Signed)
Discharge Summary    Patient ID: Brittany Mason,  MRN: [id number], DOB/AGE: 1950-12-09 65 y.o.  Admit date: 12/04/2015 Discharge date: 12/05/2015  Primary Care Provider: Gennette Pac Primary Cardiologist: Dr. Johnsie Cancel (patient previous belong to Dr. Gwenlyn Found, had an appt with Dr. Radford Pax, but changed her mind and wish to followup with Dr. Johnsie Cancel)  Discharge Diagnoses    Principal Problem:   Cardiomyopathy, ischemic Active Problems:   Hypercholesterolemia   Cardiac arrest [facility])   NSTEMI (non-ST elevated myocardial infarction) ([place])   CAD in native artery   CAD (coronary artery disease), native coronary artery   Allergies Allergies  Allergen Reactions  . Codeine Nausea And Vomiting    Takes hydrocodone; if takes doses too close together, states "violently throws up"  . Erythromycin Hives and Itching    Diagnostic Studies/Procedures    Cath 12/04/2015 Procedures    Coronary Stent Intervention   Left Heart Cath and Coronary Angiography    Conclusion     Prox RCA to Mid RCA lesion, 30% stenosed.  RPDA lesion, 60% stenosed.  Prox Cx to Mid Cx lesion, 100% stenosed.  There is mild left ventricular systolic dysfunction.  Prox Cx lesion, 99% stenosed. Post intervention, there is a 0% residual stenosis.  1. Single vessel obstructive CAD 2. Mild LV dysfunction. 3. Low LVEDP 4. Successful stenting of the proximal LCx into a large bifurcating OM1. The distal LCx is still occluded with right to left collaterals. This branch is relatively small.   Plan: DAPT for one year. Will stop Lasix. Given improvement in EF would not recommend Life Vest at this point. Anticipate DC in am.   Left Ventricle The left ventricular size is normal. There is mild left ventricular systolic dysfunction. The left ventricular ejection fraction is 45-50% by visual estimate. There are wall motion abnormalities in the left ventricle. There are segmental wall motion abnormalities in the left  ventricle.   _____________   History of Present Illness     Brittany Mason is a 65 yo female with PMH of PE on prior anticoagulation (in the setting of HRT), hypothyroidism, hyperlipidemia, RLS who had admission in 11/2015 with VF arrest. Cath on 6/14 showing total occlusion of prox circumflex (conservative management favored at that time due to cooling protocol and further neurologic recovery). In the clinic on 12/02/2015, after reviewing the previous cath report, was decided to have outpatient cardiac catheterization    Hospital Course     Patient presented for outpatient cardiac catheterization on 11/26/2015 and had a successful DES to proximal left circumflex (STENT PROMUS PREM MR 3.0X20). She has residual 60% RPDA lesion, 100% proximal to mid left circumflex occlusion, 30% proximal to mid RCA lesion. Given successful procedure and improved EF of 45%, it was felt the patient does not need any LifeVest at this time. Patient was seen on the following morning on 12/05/2015, at which time she denies any chest discomfort or shortness of breath. She is deemed stable for discharge from cardiology perspective. Emphasis has been placed on compliance with dual antiplatelet therapy. We will reorder outpatient PT/OT for this patient who recently had a cardiac arrest. Of note, patient originally was to set up with Dr. Radford Pax, however she had has not seen Dr. Radford Pax yet. She has seen by Dr. Johnsie Cancel during this hospitalization and wished to have Dr. Johnsie Cancel to be her primary cardiologist.  Note, due to hypotension with SBP into 70s and improved EF, her lisinopril, Toprol XL, spironolactone and lasix has been  discontinued. _____________  Discharge Vitals Blood pressure 116/47, pulse 81, temperature 97.9 F (36.6 C), temperature source Oral, resp. rate 18, height 5\' 3"  (1.6 m), weight 174 lb 2.6 oz (79 kg), SpO2 100 %.  Filed Weights   12/04/15 0657 12/05/15 0659  Weight: 175 lb (79.379 kg) 174 lb 2.6 oz (79 kg)      Labs & Radiologic Studies     CBC  Recent Labs  12/02/15 1058 12/05/15 0634  WBC 7.8 4.5  HGB 11.7 11.2*  HCT 35.5 35.5*  MCV 86.8 90.3  PLT 619* 123456*   Basic Metabolic Panel  Recent Labs  12/02/15 1058 12/05/15 0634  NA 135 138  K 5.0 4.3  CL 97* 105  CO2 28 28  GLUCOSE 102* 101*  BUN 25 14  CREATININE 1.06* 0.80  CALCIUM 9.5 9.2   Liver Function Tests  Recent Labs  12/02/15 1058  AST 32  ALT 58*  ALKPHOS 62  BILITOT 0.5  PROT 7.2  ALBUMIN 4.3    Dg Abd 1 View  11/20/2015  CLINICAL DATA:  Encounter for orogastric tube placement EXAM: ABDOMEN - 1 VIEW COMPARISON:  None. FINDINGS: Orogastric tube projects over the left upper quadrant in the region of the stomach. Primarily gasless abdomen otherwise. IMPRESSION: Orogastric tube projects over the stomach Electronically Signed   By: Skipper Cliche M.D.   On: 11/20/2015 07:59   Ct Head Wo Contrast  11/19/2015  CLINICAL DATA:  Status post cardiac arrest. Assess for head or cervical spine injury. Initial encounter. EXAM: CT HEAD WITHOUT CONTRAST CT CERVICAL SPINE WITHOUT CONTRAST TECHNIQUE: Multidetector CT imaging of the head and cervical spine was performed following the standard protocol without intravenous contrast. Multiplanar CT image reconstructions of the cervical spine were also generated. COMPARISON:  MRI of the cervical spine performed 05/31/2005 FINDINGS: CT HEAD FINDINGS There is no evidence of acute infarction, mass lesion, or intra- or extra-axial hemorrhage on CT. The posterior fossa, including the cerebellum, brainstem and fourth ventricle, is within normal limits. The third and lateral ventricles, and basal ganglia are unremarkable in appearance. The cerebral hemispheres are symmetric in appearance, with normal gray-white differentiation. No mass effect or midline shift is seen. There is no evidence of fracture; visualized osseous structures are unremarkable in appearance. The orbits are within normal  limits. The paranasal sinuses and mastoid air cells are well-aerated. No significant soft tissue abnormalities are seen. CT CERVICAL SPINE FINDINGS There is no evidence of acute fracture or subluxation. There is minimal grade 1 anterolisthesis of C3 on C4, and mild multilevel disc space narrowing along the lower cervical spine, with scattered small anterior and posterior disc osteophyte complexes. Underlying facet disease is noted. The thyroid gland is unremarkable in appearance. Atelectasis or possibly aspiration is noted at the lung apices bilaterally, more prominent on the right. Endotracheal and orogastric tubes are noted. No significant soft tissue abnormalities are seen. IMPRESSION: 1. No evidence of traumatic intracranial injury or fracture. 2. No evidence of acute fracture or subluxation along the cervical spine. 3. Mild degenerative change along the cervical spine. 4. Atelectasis or possibly aspiration noted at the lung apices bilaterally, more prominent on the right. Electronically Signed   By: Garald Balding M.D.   On: 11/19/2015 23:18   Ct Angio Chest Pe W/cm &/or Wo Cm  11/19/2015  CLINICAL DATA:  Post cardiac arrest. Coded for 16 minutes. History of pulmonary embolus and DVT. EXAM: CT ANGIOGRAPHY CHEST WITH CONTRAST TECHNIQUE: Multidetector CT imaging of the chest  was performed using the standard protocol during bolus administration of intravenous contrast. Multiplanar CT image reconstructions and MIPs were obtained to evaluate the vascular anatomy. CONTRAST:  88 mL Isovue 370 COMPARISON:  04/18/2013 FINDINGS: Technically adequate study with good opacification of the central and segmental pulmonary arteries. No discrete focal filling defects are demonstrated. No evidence of significant pulmonary embolus. Normal heart size. Mild coronary artery calcification. No pericardial effusion. Normal caliber thoracic aorta. Endotracheal and enteric tubes are present. Esophagus is decompressed. No significant  lymphadenopathy in the chest. Motion artifact limits evaluation of the lungs. There is dense consolidation in both lungs posteriorly with patchy airspace disease throughout the remainder of the lungs. Changes could be due to aspiration or multifocal pneumonia. No pneumothorax. No pleural effusions. Included portions of the upper abdominal organs are grossly unremarkable. The sternum and visualized ribs appear intact without acute displaced fracture identified. Review of the MIP images confirms the above findings. IMPRESSION: No evidence of significant pulmonary embolus. Diffuse airspace disease and consolidation in both lungs compatible with multifocal pneumonia or aspiration. Electronically Signed   By: Lucienne Capers M.D.   On: 11/19/2015 22:53   Ct Cervical Spine Wo Contrast  11/19/2015  CLINICAL DATA:  Status post cardiac arrest. Assess for head or cervical spine injury. Initial encounter. EXAM: CT HEAD WITHOUT CONTRAST CT CERVICAL SPINE WITHOUT CONTRAST TECHNIQUE: Multidetector CT imaging of the head and cervical spine was performed following the standard protocol without intravenous contrast. Multiplanar CT image reconstructions of the cervical spine were also generated. COMPARISON:  MRI of the cervical spine performed 05/31/2005 FINDINGS: CT HEAD FINDINGS There is no evidence of acute infarction, mass lesion, or intra- or extra-axial hemorrhage on CT. The posterior fossa, including the cerebellum, brainstem and fourth ventricle, is within normal limits. The third and lateral ventricles, and basal ganglia are unremarkable in appearance. The cerebral hemispheres are symmetric in appearance, with normal gray-white differentiation. No mass effect or midline shift is seen. There is no evidence of fracture; visualized osseous structures are unremarkable in appearance. The orbits are within normal limits. The paranasal sinuses and mastoid air cells are well-aerated. No significant soft tissue abnormalities are  seen. CT CERVICAL SPINE FINDINGS There is no evidence of acute fracture or subluxation. There is minimal grade 1 anterolisthesis of C3 on C4, and mild multilevel disc space narrowing along the lower cervical spine, with scattered small anterior and posterior disc osteophyte complexes. Underlying facet disease is noted. The thyroid gland is unremarkable in appearance. Atelectasis or possibly aspiration is noted at the lung apices bilaterally, more prominent on the right. Endotracheal and orogastric tubes are noted. No significant soft tissue abnormalities are seen. IMPRESSION: 1. No evidence of traumatic intracranial injury or fracture. 2. No evidence of acute fracture or subluxation along the cervical spine. 3. Mild degenerative change along the cervical spine. 4. Atelectasis or possibly aspiration noted at the lung apices bilaterally, more prominent on the right. Electronically Signed   By: Garald Balding M.D.   On: 11/19/2015 23:18   Dg Chest Port 1 View  11/22/2015  CLINICAL DATA:  Intubation. EXAM: PORTABLE CHEST 1 VIEW COMPARISON:  11/21/2015. FINDINGS: Endotracheal tube, NG tube, right IJ line stable position. Mild cardiomegaly. Pulmonary venous congestion. Diffuse bilateral pulmonary infiltrates most consistent pulmonary edema. Slight interim clearing from prior exam. No pleural effusion or pneumothorax. IMPRESSION: 1. Lines and tubes in stable position. 2. Persistent cardiomegaly mild pulmonary venous congestion. Bilateral pulmonary infiltrates again noted, slightly improved prior exam. Findings consistent with slight  improvement of congestive heart failure. Electronically Signed   By: [place]   On: 11/22/2015 07:03   Dg Chest Port 1 View  11/21/2015  CLINICAL DATA:  Shortness of breath. EXAM: PORTABLE CHEST 1 VIEW COMPARISON:  11/20/2015.  CT 11/19/2015. FINDINGS: Endotracheal tube, NG tube, right IJ line stable position. Cardiomegaly. Increasing bilateral airspace disease and pleural  effusions. Findings suggest possibly congestive heart failure. Bilateral pneumonia cannot be excluded. No pneumothorax . IMPRESSION: 1. Lines and tubes in stable position. 2. Cardiomegaly with progressive diffuse bilateral airspace disease and bilateral pleural effusions suggesting congestive heart failure. Bilateral pneumonia cannot be excluded. Electronically Signed   By: Marcello Moores  Register   On: 11/21/2015 07:33   Dg Chest Port 1 View  11/20/2015  CLINICAL DATA:  Central line placement, intubated EXAM: PORTABLE CHEST 1 VIEW COMPARISON:  11/19/2015 FINDINGS: Slight worsening in aeration. There is right upper lobe and left perihilar airspace disease suspicious for multifocal pneumonia. Mild bilateral basilar atelectasis or infiltrate. Stable endotracheal and NG tube position. There is right IJ central line with tip in SVC. No pneumothorax. IMPRESSION: Slight worsening in aeration. Right upper lobe and left perihilar airspace disease suspicious for bilateral pneumonia. Mild basilar atelectasis or infiltrate. Stable endotracheal and NG tube position. Right IJ central line in place. No pneumothorax. Electronically Signed   By: Lahoma Crocker M.D.   On: 11/20/2015 13:20   Dg Chest Port 1 View  11/19/2015  CLINICAL DATA:  Post CPR.  History of mitral valve prolapse. EXAM: PORTABLE CHEST 1 VIEW COMPARISON:  Chest x-ray dated 08/21/2015. FINDINGS: Mild cardiomegaly is stable. Endotracheal tube is well positioned with tip approximately 2.5 cm above the carina. Enteric tube passes below the diaphragm. Central pulmonary vascular congestion and bilateral interstitial edema, most confluent within the right upper lung. No pleural effusion or pneumothorax seen. Osseous structures about the chest are unremarkable. IMPRESSION: 1. Mild cardiomegaly with central pulmonary vascular congestion and bilateral interstitial edema indicating mild volume overload and/or sequela of resuscitation efforts. Lungs otherwise clear. No  pneumothorax. 2. Endotracheal tube well positioned with tip approximately 2.5 cm above the carina. Electronically Signed   By: Franki Cabot M.D.   On: 11/19/2015 21:09    Disposition   Pt is being discharged home today in good condition.  Follow-up Plans & Appointments    Follow-up Information    Follow up with Truitt Merle, NP On 12/16/2015.   Specialties:  Nurse Practitioner, Interventional Cardiology, Cardiology, Radiology   Why:  9:00AM. Cardiology followup   Contact information:   [address] [place] [address] [place] [address] [phone]       Follow up with Jenkins Rouge, MD On 02/19/2016.   Specialty:  Cardiology   Why:  10:45AM. Cardiology visit, establish with Dr. Nolen Mu information:   [id number] [place] [address] [phone]      Discharge Instructions    AMB Referral to Cardiac Rehabilitation - Phase II    Complete by:  As directed   Diagnosis:  Coronary Stents           Discharge Medications   Current Discharge Medication List    CONTINUE these medications which have NOT CHANGED   Details  aspirin 81 MG tablet Take 81 mg by mouth daily.    atorvastatin (LIPITOR) 80 MG tablet 1 tablet (80 mg total) by Per NG tube route daily at 6 PM. Qty: 30 tablet, Refills: 2    butalbital-acetaminophen-caffeine (FIORICET) 50-325-40 MG  tablet Take 1-2 tablets by mouth every 6 (six) hours as needed for headache. Qty: 20 tablet, Refills: 0    clopidogrel (PLAVIX) 75 MG tablet Take 1 tablet (75 mg total) by mouth daily. Qty: 30 tablet, Refills: 2    escitalopram (LEXAPRO) 20 MG tablet Take 20 mg by mouth every morning.     fenofibrate 160 MG tablet Take 160 mg by mouth daily. Takes at bedtime    levothyroxine (SYNTHROID, LEVOTHROID) 150 MCG tablet Take 150 mcg by mouth daily before breakfast.     morphine (MSIR) 30 MG tablet Take 30 mg by mouth every 12 (twelve) hours.    omega-3 acid ethyl esters (LOVAZA) 1 G capsule Take  2 g by mouth 2 (two) times daily.    potassium chloride SA (K-DUR,KLOR-CON) 20 MEQ tablet Take 2 tablets (40 mEq total) by mouth daily. Qty: 60 tablet, Refills: 2    rOPINIRole (REQUIP) 0.5 MG tablet Take 0.5 mg by mouth every evening.     cyclobenzaprine (FLEXERIL) 5 MG tablet Take 5 mg by mouth 3 (three) times daily.  Refills: 1    nitroGLYCERIN (NITROSTAT) 0.4 MG SL tablet Place 1 tablet (0.4 mg total) under the tongue every 5 (five) minutes as needed for chest pain. Qty: 25 tablet, Refills: 3    OVER THE COUNTER MEDICATION Take 1 capsule by mouth daily. "Relizen" herbal supplement for hormone replacement      STOP taking these medications     lisinopril (PRINIVIL,ZESTRIL) 2.5 MG tablet      metoprolol succinate (TOPROL-XL) 25 MG 24 hr tablet      spironolactone (ALDACTONE) 25 MG tablet      furosemide (LASIX) 40 MG tablet          Aspirin prescribed at discharge?  Yes High Intensity Statin Prescribed? (Lipitor 40-80mg  or Crestor 20-40mg ): Yes Beta Blocker Prescribed? No: low BP For EF 40% or less, Was ACEI/ARB Prescribed? No: low BP ADP Receptor Inhibitor Prescribed? (i.e. Plavix etc.-Includes Medically Managed Patients): Yes For EF <40%, Aldosterone Inhibitor Prescribed? No: low BP Was EF assessed during THIS hospitalization? Yes Was Cardiac Rehab II ordered? (Included Medically managed Patients): Yes   Outstanding Labs/Studies   None  Duration of Discharge Encounter   Greater than 30 minutes including physician time.  Signed, Almyra Deforest PA-C 12/05/2015, 8:25 AM

## 2015-12-05 NOTE — Discharge Instructions (Signed)
No lifting over 5 lbs for 1 week. No sexual activity for 1 week. Keep procedure site clean & dry. If you notice increased pain, swelling, bleeding or pus, call/return!  You may shower, but no soaking baths/hot tubs/pools for 1 week.   STOP taking these medications     lisinopril (PRINIVIL,ZESTRIL) 2.5 MG tablet      metoprolol succinate (TOPROL-XL) 25 MG 24 hr tablet      spironolactone (ALDACTONE) 25 MG tablet      furosemide (LASIX) 40 MG tablet                  potassium chloride SA (K-DUR,KLOR-CON) 20 MEQ tablet

## 2015-12-05 NOTE — Progress Notes (Signed)
CARDIAC REHAB PHASE I   PRE:  Rate/Rhythm: 64 SR    BP: sitting 99/42    SaO2:   MODE:  Ambulation: 450 ft   POST:  Rate/Rhythm: 84 SR    BP: sitting 116/47     SaO2:   Tolerated well, denied problems. Would have walked longer but PA came. BP stable. Ed reviewed/completed. Pt is easily confused by meds. Will update CRPII as pt is already in process (has referral). Encouraged pt to continue to take BP x2 a day and increase ex at home.  [place], ACSM 12/05/2015 8:41 AM

## 2015-12-05 NOTE — Progress Notes (Signed)
Patient ID: Brittany Mason, female   DOB: April 06, 1951, 65 y.o.   MRN: [id number]    Subjective:  Denies SSCP, palpitations or Dyspnea Needs script / note to continue PT/OT at home   Objective:  Filed Vitals:   12/04/15 1420 12/04/15 1630 12/04/15 1955 12/05/15 0659  BP: 78/34 82/46 90/39  103/34  Pulse:  51 62 50  Temp:  98 F (36.7 C) 97.9 F (36.6 C) 97.8 F (36.6 C)  TempSrc:  Oral Oral Oral  Resp: 11 13 14 11   Height:      Weight:    174 lb 2.6 oz (79 kg)  SpO2:  94% 95% 100%    Intake/Output from previous day:  Intake/Output Summary (Last 24 hours) at 12/05/15 0744 Last data filed at 12/04/15 1955  Gross per 24 hour  Intake   1803 ml  Output   2100 ml  Net   -297 ml    Physical Exam: Affect appropriate Healthy:  appears stated age HEENT: normal Neck supple with no adenopathy JVP normal no bruits no thyromegaly Lungs clear with no wheezing and good diaphragmatic motion Heart:  S1/S2 no murmur, no rub, gallop or click PMI normal Abdomen: benighn, BS positve, no tenderness, no AAA no bruit.  No HSM or HJR Distal pulses intact with no bruits No edema Neuro non-focal Skin warm and dry No muscular weakness Right radial A   Lab Results: Basic Metabolic Panel:  Recent Labs  12/02/15 1058  NA 135  K 5.0  CL 97*  CO2 28  GLUCOSE 102*  BUN 25  CREATININE 1.06*  CALCIUM 9.5   Liver Function Tests:  Recent Labs  12/02/15 1058  AST 32  ALT 58*  ALKPHOS 62  BILITOT 0.5  PROT 7.2  ALBUMIN 4.3   CBC:  Recent Labs  12/02/15 1058  WBC 7.8  HGB 11.7  HCT 35.5  MCV 86.8  PLT 619*    Imaging: No results found.  Cardiac Studies:  ECG:  SR normal    Telemetry:  NSR 12/05/2015   Echo:   Medications:   . aspirin EC  81 mg Oral Daily  . atorvastatin  80 mg Per NG tube q1800  . clopidogrel  75 mg Oral Daily  . escitalopram  20 mg Oral Daily  . fenofibrate  160 mg Oral QHS  . levothyroxine  150 mcg Oral QAC breakfast  . morphine  30 mg  Oral Q12H  . omega-3 acid ethyl esters  2 g Oral BID  . rOPINIRole  0.5 mg Oral QPM  . sodium chloride flush  3 mL Intravenous Q12H       Assessment/Plan:  CAD:  Post stenting of circumflex with occluded distal vessel and right to left collaterals DAT for a year Chol:  On statin BP:  Has been low EF better d/c beta blocker , diuretic and ACE    Needs script order for PT/OT   D/C home this am   Jenkins Rouge 12/05/2015, 7:44 AM

## 2015-12-06 ENCOUNTER — Telehealth: Payer: Self-pay | Admitting: Pulmonary Disease

## 2015-12-06 DIAGNOSIS — M549 Dorsalgia, unspecified: Secondary | ICD-10-CM | POA: Diagnosis not present

## 2015-12-06 DIAGNOSIS — I214 Non-ST elevation (NSTEMI) myocardial infarction: Secondary | ICD-10-CM | POA: Diagnosis not present

## 2015-12-06 DIAGNOSIS — Z86718 Personal history of other venous thrombosis and embolism: Secondary | ICD-10-CM | POA: Diagnosis not present

## 2015-12-06 DIAGNOSIS — E7800 Pure hypercholesterolemia, unspecified: Secondary | ICD-10-CM | POA: Diagnosis not present

## 2015-12-06 DIAGNOSIS — G43909 Migraine, unspecified, not intractable, without status migrainosus: Secondary | ICD-10-CM | POA: Diagnosis not present

## 2015-12-06 DIAGNOSIS — M1711 Unilateral primary osteoarthritis, right knee: Secondary | ICD-10-CM | POA: Diagnosis not present

## 2015-12-06 DIAGNOSIS — I5189 Other ill-defined heart diseases: Secondary | ICD-10-CM | POA: Diagnosis not present

## 2015-12-06 DIAGNOSIS — Z86711 Personal history of pulmonary embolism: Secondary | ICD-10-CM | POA: Diagnosis not present

## 2015-12-06 DIAGNOSIS — Z8674 Personal history of sudden cardiac arrest: Secondary | ICD-10-CM | POA: Diagnosis not present

## 2015-12-06 NOTE — Telephone Encounter (Signed)
Called spoke with [place] at [place]. She states that needs the icd-10 code and dates of appointments for this month. I explained to her that we have not seen the pt since 06/20/15. She voiced understanding and had no further questions.

## 2015-12-09 ENCOUNTER — Telehealth: Payer: Self-pay | Admitting: Cardiology

## 2015-12-09 DIAGNOSIS — G43909 Migraine, unspecified, not intractable, without status migrainosus: Secondary | ICD-10-CM | POA: Diagnosis not present

## 2015-12-09 DIAGNOSIS — E7800 Pure hypercholesterolemia, unspecified: Secondary | ICD-10-CM | POA: Diagnosis not present

## 2015-12-09 DIAGNOSIS — Z86711 Personal history of pulmonary embolism: Secondary | ICD-10-CM | POA: Diagnosis not present

## 2015-12-09 DIAGNOSIS — Z8674 Personal history of sudden cardiac arrest: Secondary | ICD-10-CM | POA: Diagnosis not present

## 2015-12-09 DIAGNOSIS — M549 Dorsalgia, unspecified: Secondary | ICD-10-CM | POA: Diagnosis not present

## 2015-12-09 DIAGNOSIS — I5189 Other ill-defined heart diseases: Secondary | ICD-10-CM | POA: Diagnosis not present

## 2015-12-09 DIAGNOSIS — M1711 Unilateral primary osteoarthritis, right knee: Secondary | ICD-10-CM | POA: Diagnosis not present

## 2015-12-09 DIAGNOSIS — I214 Non-ST elevation (NSTEMI) myocardial infarction: Secondary | ICD-10-CM | POA: Diagnosis not present

## 2015-12-09 DIAGNOSIS — Z86718 Personal history of other venous thrombosis and embolism: Secondary | ICD-10-CM | POA: Diagnosis not present

## 2015-12-09 NOTE — Telephone Encounter (Signed)
Patient brought [facility] Attending Provider Statement and [employer] FMLA forms for Dr Martinique to complete and sign.  Received signed AUTH/Checks/[employer] forms/[employer] FMLA forms/  Sent to [employer] @ [employer] to process.  Sent via [place] on 12/09/15. lp

## 2015-12-11 DIAGNOSIS — E782 Mixed hyperlipidemia: Secondary | ICD-10-CM | POA: Diagnosis not present

## 2015-12-11 DIAGNOSIS — I5189 Other ill-defined heart diseases: Secondary | ICD-10-CM | POA: Diagnosis not present

## 2015-12-11 DIAGNOSIS — G43909 Migraine, unspecified, not intractable, without status migrainosus: Secondary | ICD-10-CM | POA: Diagnosis not present

## 2015-12-11 DIAGNOSIS — I214 Non-ST elevation (NSTEMI) myocardial infarction: Secondary | ICD-10-CM | POA: Diagnosis not present

## 2015-12-11 DIAGNOSIS — D649 Anemia, unspecified: Secondary | ICD-10-CM | POA: Diagnosis not present

## 2015-12-11 DIAGNOSIS — M1711 Unilateral primary osteoarthritis, right knee: Secondary | ICD-10-CM | POA: Diagnosis not present

## 2015-12-11 DIAGNOSIS — Z86718 Personal history of other venous thrombosis and embolism: Secondary | ICD-10-CM | POA: Diagnosis not present

## 2015-12-11 DIAGNOSIS — Z86711 Personal history of pulmonary embolism: Secondary | ICD-10-CM | POA: Diagnosis not present

## 2015-12-11 DIAGNOSIS — Z79899 Other long term (current) drug therapy: Secondary | ICD-10-CM | POA: Diagnosis not present

## 2015-12-11 DIAGNOSIS — M549 Dorsalgia, unspecified: Secondary | ICD-10-CM | POA: Diagnosis not present

## 2015-12-11 DIAGNOSIS — E7800 Pure hypercholesterolemia, unspecified: Secondary | ICD-10-CM | POA: Diagnosis not present

## 2015-12-11 DIAGNOSIS — Z8674 Personal history of sudden cardiac arrest: Secondary | ICD-10-CM | POA: Diagnosis not present

## 2015-12-11 DIAGNOSIS — I252 Old myocardial infarction: Secondary | ICD-10-CM | POA: Diagnosis not present

## 2015-12-11 MED FILL — PANTOPRAZOLE SOD DR 40 MG T: 40 | 30 days supply | Qty: 30 | Fill #0

## 2015-12-12 ENCOUNTER — Telehealth: Payer: Self-pay | Admitting: Cardiology

## 2015-12-12 ENCOUNTER — Encounter: Payer: Self-pay | Admitting: Nurse Practitioner

## 2015-12-12 NOTE — Telephone Encounter (Signed)
Received FMLA Forms and [employer] Attending Provider Statement back from [facility] for Dr Martinique to review, complete and sign.  Forms given to Elly Modena, RN for Dr Martinique. lp

## 2015-12-13 DIAGNOSIS — M549 Dorsalgia, unspecified: Secondary | ICD-10-CM | POA: Diagnosis not present

## 2015-12-13 DIAGNOSIS — I214 Non-ST elevation (NSTEMI) myocardial infarction: Secondary | ICD-10-CM | POA: Diagnosis not present

## 2015-12-13 DIAGNOSIS — E7800 Pure hypercholesterolemia, unspecified: Secondary | ICD-10-CM | POA: Diagnosis not present

## 2015-12-13 DIAGNOSIS — M1711 Unilateral primary osteoarthritis, right knee: Secondary | ICD-10-CM | POA: Diagnosis not present

## 2015-12-13 DIAGNOSIS — I5189 Other ill-defined heart diseases: Secondary | ICD-10-CM | POA: Diagnosis not present

## 2015-12-13 DIAGNOSIS — Z86718 Personal history of other venous thrombosis and embolism: Secondary | ICD-10-CM | POA: Diagnosis not present

## 2015-12-13 DIAGNOSIS — Z86711 Personal history of pulmonary embolism: Secondary | ICD-10-CM | POA: Diagnosis not present

## 2015-12-13 DIAGNOSIS — G43909 Migraine, unspecified, not intractable, without status migrainosus: Secondary | ICD-10-CM | POA: Diagnosis not present

## 2015-12-13 DIAGNOSIS — Z8674 Personal history of sudden cardiac arrest: Secondary | ICD-10-CM | POA: Diagnosis not present

## 2015-12-16 ENCOUNTER — Telehealth: Payer: Self-pay | Admitting: Cardiology

## 2015-12-16 ENCOUNTER — Encounter: Payer: Self-pay | Admitting: Nurse Practitioner

## 2015-12-16 ENCOUNTER — Ambulatory Visit (INDEPENDENT_AMBULATORY_CARE_PROVIDER_SITE_OTHER): Payer: 59 | Admitting: Nurse Practitioner

## 2015-12-16 VITALS — BP 108/70 | HR 68 | Ht 63.0 in | Wt 178.2 lb

## 2015-12-16 DIAGNOSIS — I5189 Other ill-defined heart diseases: Secondary | ICD-10-CM | POA: Diagnosis not present

## 2015-12-16 DIAGNOSIS — M1711 Unilateral primary osteoarthritis, right knee: Secondary | ICD-10-CM | POA: Diagnosis not present

## 2015-12-16 DIAGNOSIS — Z86718 Personal history of other venous thrombosis and embolism: Secondary | ICD-10-CM | POA: Diagnosis not present

## 2015-12-16 DIAGNOSIS — Z8674 Personal history of sudden cardiac arrest: Secondary | ICD-10-CM | POA: Diagnosis not present

## 2015-12-16 DIAGNOSIS — E7800 Pure hypercholesterolemia, unspecified: Secondary | ICD-10-CM | POA: Diagnosis not present

## 2015-12-16 DIAGNOSIS — G43909 Migraine, unspecified, not intractable, without status migrainosus: Secondary | ICD-10-CM | POA: Diagnosis not present

## 2015-12-16 DIAGNOSIS — I469 Cardiac arrest, cause unspecified: Secondary | ICD-10-CM | POA: Diagnosis not present

## 2015-12-16 DIAGNOSIS — Z86711 Personal history of pulmonary embolism: Secondary | ICD-10-CM | POA: Diagnosis not present

## 2015-12-16 DIAGNOSIS — Z955 Presence of coronary angioplasty implant and graft: Secondary | ICD-10-CM | POA: Diagnosis not present

## 2015-12-16 DIAGNOSIS — I255 Ischemic cardiomyopathy: Secondary | ICD-10-CM | POA: Diagnosis not present

## 2015-12-16 DIAGNOSIS — I214 Non-ST elevation (NSTEMI) myocardial infarction: Secondary | ICD-10-CM | POA: Diagnosis not present

## 2015-12-16 DIAGNOSIS — E785 Hyperlipidemia, unspecified: Secondary | ICD-10-CM

## 2015-12-16 DIAGNOSIS — M549 Dorsalgia, unspecified: Secondary | ICD-10-CM | POA: Diagnosis not present

## 2015-12-16 MED ORDER — CLOPIDOGREL BISULFATE 75 MG PO TABS: 75.0000 mg | ORAL_TABLET | Freq: Every day | ORAL | Status: DC

## 2015-12-16 MED FILL — CLOPIDOGREL 75 MG TABLET: 75 | 90 days supply | Qty: 90 | Fill #0

## 2015-12-16 NOTE — Patient Instructions (Addendum)
We will be checking the following labs today - NONE  Medication Instructions:    Continue with your current medicines.     Testing/Procedures To Be Arranged:  N/A  Follow-Up:  See me in 4 weeks with fasting labs; see Dr. Johnsie Cancel in September as planned   Other Special Instructions:   No driving for 6 months    If you need a refill on your cardiac medications before your next appointment, please call your pharmacy.   Call the [place] office at [phone] if you have any questions, problems or concerns.

## 2015-12-16 NOTE — Telephone Encounter (Signed)
Received Signed FMLA and Attending Provider Statement back from Dr Martinique.  Notified patient forms were ready and signed.  Faxed  FMLA to [facility] [employer] and Attending Physician Statement to [facility].  Made copy for patient and prepared for pick up. lp

## 2015-12-16 NOTE — Progress Notes (Signed)
CARDIOLOGY OFFICE NOTE  Date:  12/16/2015    Brittany Mason Date of Birth: September 24, 1950 Medical Record #[id number]  PCP:  Gennette Pac, MD  Cardiologist:  Gillian Shields (Patient wishes to switch from [facility] to [facility])    Chief Complaint  Patient presents with  . Coronary Artery Disease    Post PCI visit - seen for Dr. Radford Pax    History of Present Illness: Brittany Mason is a 65 y.o. female who presents today for a post PCI visit. Seen for Dr. Johnsie Cancel. She is switching from Dr. Gwenlyn Found.   She has a history of pulmonary embolism on prior chronic anticoagulation (in the setting of HRT), mitral valve prolapse, hypothyroidism, hypercholesterolemia, and restless leg syndrome who presented earlier this month with VF cardiac arrest at home - with 16 minutes of down-time. Cath on 6/14 showing total occlusion of prox circumflex (conservative management favored at that time due to cooling protocol and further neurologic recovery). She was seen here back in March with atypical chest pain - had a normal stress test back in March of 2017. Strong + FH for CAD.  She was discharged 11/25/2015.    I saw her post hospital. Referred on for her PCI. See below. EF has improved. LifeVest stopped.   Comes in today. Here with her "best friend". Her name is Brittany Mason. She now wishes to switch from Dr. Gwenlyn Found to Dr. Johnsie Cancel. She met Dr. Johnsie Cancel during this last admission. She feels like she is doing well. Has finished up with home PT. Very interested in going to Cardiac Rehab. No chest pain. Not short of breath. Not dizzy or lightheaded. No swelling. Her only issues are driving and an aching sensation she has going down the outside of the right thigh - constant - started the day before her PCI - did not say anything about this to anyone. Saw her PCP last week and did not mention but did have her labs - those are reviewed. She has had chronic back issues - she is on chronic narcotics for this - sees ortho.  Brittany Mason notes that she seemed to "fall pretty hard" at the time of her arrest. Also on Requip for restless leg. No longer on her Voltaren. Off Lasix. Asking about returning to work. Asking about driving as well. Her memory continues to improve on a daily basis. She has better concentration. Walking about 15 minutes twice a day without issue.        Past Medical History  Diagnosis Date  . Hypothyroidism   . Hypercholesterolemia   . RLS (restless legs syndrome)   . PONV (postoperative nausea and vomiting)   . Chronic SI joint pain   . MVP (mitral valve prolapse)     asymptomatic  . Dyspnea on exertion   . PE (pulmonary embolism) Apr 18, 2013    tx. -using Xarelto now, no further lung problems, denies SOB on 01-02-14  . DVT (deep venous thrombosis) ([facility]) 04/2013    left lower leg, resulted in Pulmonary emboli on hormone therapy  . Acute pulmonary embolism ([facility]) 06/20/2015  . Myocardial infarction ([facility]) 11/19/2015    cardiac arrest  . Migraine     "< once/month" (12/04/2015)  . Arthritis     osteoarthritis-knees. Chronic back pain  . Basal cell carcinoma of right shoulder     "burned off"    Past Surgical History  Procedure Laterality Date  . Anterior cruciate ligament repair Left 1976; 1981    "open"  .  Colonoscopy  09/28/2011    Procedure: COLONOSCOPY;  Surgeon: Juanita Craver, MD;  Location: [facility];  Service: Endoscopy;  Laterality: N/A;  . Total knee arthroplasty Left 01/08/2014    Procedure: LEFT TOTAL KNEE ARTHROPLASTY;  Surgeon: Gearlean Alf, MD;  Location: [facility] OR[facility];  Service: Orthopedics;  Laterality: Left;  . Joint replacement    . Cardiac catheterization N/A 11/20/2015    Procedure: Left Heart Cath and Coronary Angiography;  Surgeon: Belva Crome, MD;  Location: [facility];  Service: Cardiovascular;  Laterality: N/A;  . Cardiac catheterization N/A 12/04/2015    Procedure: Left Heart Cath and Coronary Angiography;  Surgeon: Peter M Martinique, MD;  Location: [facility];  Service: Cardiovascular;  Laterality: N/A;  . Cardiac catheterization  12/04/2015    Procedure: Coronary Stent Intervention;  Surgeon: Peter M Martinique, MD;  Location: [facility];  Service: Cardiovascular;;  . Total abdominal hysterectomy  1998     Medications: Current Outpatient Prescriptions  Medication Sig Dispense Refill  . aspirin 81 MG tablet Take 81 mg by mouth daily.    Marland Kitchen atorvastatin (LIPITOR) 80 MG tablet 1 tablet (80 mg total) by Per NG tube route daily at 6 PM. 30 tablet 2  . butalbital-acetaminophen-caffeine (FIORICET) 50-325-40 MG tablet Take 1-2 tablets by mouth every 6 (six) hours as needed for headache. 20 tablet 0  . clopidogrel (PLAVIX) 75 MG tablet Take 1 tablet (75 mg total) by mouth daily. 90 tablet 3  . cyclobenzaprine (FLEXERIL) 5 MG tablet Take 5 mg by mouth 3 (three) times daily.   1  . escitalopram (LEXAPRO) 20 MG tablet Take 20 mg by mouth every morning.     . fenofibrate 160 MG tablet Take 160 mg by mouth daily. Takes at bedtime    . levothyroxine (SYNTHROID, LEVOTHROID) 150 MCG tablet Take 150 mcg by mouth daily before breakfast.     . morphine (MSIR) 30 MG tablet Take 30 mg by mouth every 12 (twelve) hours.    . nitroGLYCERIN (NITROSTAT) 0.4 MG SL tablet Place 1 tablet (0.4 mg total) under the tongue every 5 (five) minutes as needed for chest pain. 25 tablet 3  . omega-3 acid ethyl esters (LOVAZA) 1 G capsule Take 2 g by mouth 2 (two) times daily.    Marland Kitchen OVER THE COUNTER MEDICATION Take 1 capsule by mouth daily. "Relizen" herbal supplement for hormone replacement    . pantoprazole (PROTONIX) 40 MG tablet Take 40 mg by mouth daily.    Marland Kitchen rOPINIRole (REQUIP) 0.5 MG tablet Take 0.5 mg by mouth every evening.      No current facility-administered medications for this visit.    Allergies: Allergies  Allergen Reactions  . Codeine Nausea And Vomiting    Takes hydrocodone; if takes doses too close together, states "violently throws up"  .  Erythromycin Hives and Itching    Social History: The patient  reports that she has never smoked. She has never used smokeless tobacco. She reports that she drinks alcohol. She reports that she does not use illicit drugs.   Family History: [name] family history includes Breast cancer in her maternal aunt and paternal aunt; Cancer in her maternal aunt; Congestive Heart Failure in her mother; Coronary artery disease in her father; Hypertension in her father and mother; Stroke in her brother.   Review of Systems: Please see the history of present illness.   Otherwise, the review of systems is positive for none.   All  other systems are reviewed and negative.   Physical Exam: VS:  BP 108/70 mmHg  Pulse 68  Ht 5' 3"  (1.6 m)  Wt 178 lb 3.2 oz (80.831 kg)  BMI 31.57 kg/m2 .  BMI Body mass index is 31.57 kg/(m^2).  Wt Readings from Last 3 Encounters:  12/16/15 178 lb 3.2 oz (80.831 kg)  12/05/15 174 lb 2.6 oz (79 kg)  12/02/15 177 lb 13.9 oz (80.681 kg)    General: Pleasant. Well developed, well nourished and in no acute distress.  HEENT: Normal. Neck: Supple, no JVD, carotid bruits, or masses noted.  Cardiac: Regular rate and rhythm. No murmurs, rubs, or gallops. No edema.  Respiratory:  Lungs are clear to auscultation bilaterally with normal work of breathing.  GI: Soft and nontender.  MS: No deformity or atrophy. Gait and ROM intact. Skin: Warm and dry. Color is normal.  Neuro:  Strength and sensation are intact and no gross focal deficits noted.  Psych: Alert, appropriate and with normal affect. Right wrist is ok.    LABORATORY DATA:  EKG:  EKG is not ordered today.  Lab Results  Component Value Date   WBC 4.5 12/05/2015   HGB 11.2* 12/05/2015   HCT 35.5* 12/05/2015   PLT 527* 12/05/2015   GLUCOSE 101* 12/05/2015   CHOL 181 11/19/2015   TRIG 186* 11/21/2015   HDL 66 11/19/2015   LDLCALC 94 11/19/2015   ALT 58* 12/02/2015   AST 32 12/02/2015   NA 138 12/05/2015    K 4.3 12/05/2015   CL 105 12/05/2015   CREATININE 0.80 12/05/2015   BUN 14 12/05/2015   CO2 28 12/05/2015   TSH 5.260* 11/19/2015   INR 1.1 12/02/2015   HGBA1C 5.7* 11/19/2015    BNP (last 3 results)  Recent Labs  11/19/15 2335 12/02/15 1058  BNP 80.0 170.6*    ProBNP (last 3 results) No results for input(s): PROBNP in the last 8760 hours.   Other Studies Reviewed Today:  Cardiac Cath/PCI Conclusion from 12/04/15     Prox RCA to Mid RCA lesion, 30% stenosed.  RPDA lesion, 60% stenosed.  Prox Cx to Mid Cx lesion, 100% stenosed.  There is mild left ventricular systolic dysfunction.  Prox Cx lesion, 99% stenosed. Post intervention, there is a 0% residual stenosis.  1. Single vessel obstructive CAD 2. Mild LV dysfunction. 3. Low LVEDP 4. Successful stenting of the proximal LCx into a large bifurcating OM1. The distal LCx is still occluded with right to left collaterals. This branch is relatively small.   Plan: DAPT for one year. Will stop Lasix. Given improvement in EF would not recommend Life Vest at this point. Anticipate DC in am.       Cardiac Catheterization: 11/20/2015  Prox RCA to Dist RCA lesion, 45% stenosed.  RPDA lesion, 65% stenosed.  Prox Cx to Mid Cx lesion, 100% stenosed.   Total occlusion (Acute vs Chronic) of the proximal circumflex with collaterals from left to left and right to left. The collaterals appear to be meager however, the patient is on the cooling protocol and I suspect that collateralization will significantly improved once the patient is back to body temperature. If angina develops post arrest, recanalization of the circumflex coronary artery would be reasonable.  Diffuse moderate stenosis in a large PDA, up to 70% obstruction. Relatively small distribution LAD that reaches, but does not wrap around, the left ventricular apex. No significant obstruction is noted in the LAD.  Mid inferior wall moderate hypokinesis. Estimated  ejection fraction 45-50%. Marked elevation in left ventricular end-diastolic pressure of 40 mmHg secondary to acute ischemia and cooling.   Recommendations:  Conservative medical management for the time being.  I would add Plavix to the patient's medical regimen.  Should the patient have a significant neurological recovery, angina/ongoing evidence of ischemia could be treated with circumflex PCI. We chose against PCI today given cooling, vasoconstriction related to cooling, and clinical stability at the present time without evidence of ongoing ischemia.   Echocardiogram: 11/20/2015 Study Conclusions  - Left ventricle: The cavity size was normal. There was mild focal  basal hypertrophy of the septum. Systolic function was moderately  reduced. The estimated ejection fraction was in the range of 35%  to 40%. Diffuse hypokinesis. There is akinesis of the  mid-apicalinferolateral and inferior myocardium. Doppler  parameters are consistent with abnormal left ventricular  relaxation (grade 1 diastolic dysfunction). - Aortic valve: Trileaflet; mildly thickened, mildly calcified  leaflets. There was mild regurgitation directed eccentrically in  the LVOT. - Aorta: Aortic root dimension: 38 mm (ED). - Ascending aorta: The ascending aorta was mildly dilated. - Mitral valve: Calcified annulus. - Right ventricle: The cavity size was moderately dilated. Wall  thickness was normal. Systolic function was moderately reduced.  Assessment/Plan: 1. V-fib Cardiac Arrest/ NSTEMI - with LV dysfunction - Cath on 6/14 showed total occlusion of prox circumflex (conservative management favored at that time due to cooling protocol and to assess for further neurologic recovery). She is now s/p PCI to the LCX. Mild LV dysfunction noted at time of repeat cath. She is doing great. Refer to cardiac rehab. Consider returning to work in about a month. Not able to drive for 6 months post arrest.   2.  Ischemic CM - mild LV dysfunction noted on repeat cath. Life Vest is off. Doing very well clinically. Would consider repeat echo 3 months post PCI to recheck.   3. Hypokalemia - potassium 4.5 on recent lab by PCP  4. HLD - now on statin therapy - needs fasting labs on return OV   5. Elevated LFTs - improved on recheck. Normal by recent labs from PCP.   6. Short term memory issues - greatly improved.   7. Right thigh discomfort - started prior to this most recent PCI. Has back issues - discussed with Dr. Rayann Heman - would let her see her orthopedist/PCP.  She is on chronic narcotics. No gross deficits noted.   Current medicines are reviewed with the patient today.  The patient does not have concerns regarding medicines other than what has been noted above.  The following changes have been made:  See above.  Labs/ tests ordered today include:   No orders of the defined types were placed in this encounter.     Disposition:   FU with me in 4  weeks with fasting labs and she has follow up with Dr. Johnsie Cancel in September.   Patient is agreeable to this plan and will call if any problems develop in the interim.   Signed: Burtis Junes, RN, ANP-C 12/16/2015 9:46 AM  [facility] [address] [place],   [address] Phone: [phone] Fax: [phone]

## 2015-12-18 ENCOUNTER — Telehealth: Payer: Self-pay | Admitting: *Deleted

## 2015-12-18 ENCOUNTER — Telehealth: Payer: Self-pay | Admitting: Nurse Practitioner

## 2015-12-18 DIAGNOSIS — G43909 Migraine, unspecified, not intractable, without status migrainosus: Secondary | ICD-10-CM | POA: Diagnosis not present

## 2015-12-18 DIAGNOSIS — M549 Dorsalgia, unspecified: Secondary | ICD-10-CM | POA: Diagnosis not present

## 2015-12-18 DIAGNOSIS — Z8674 Personal history of sudden cardiac arrest: Secondary | ICD-10-CM | POA: Diagnosis not present

## 2015-12-18 DIAGNOSIS — E7800 Pure hypercholesterolemia, unspecified: Secondary | ICD-10-CM | POA: Diagnosis not present

## 2015-12-18 DIAGNOSIS — I214 Non-ST elevation (NSTEMI) myocardial infarction: Secondary | ICD-10-CM | POA: Diagnosis not present

## 2015-12-18 DIAGNOSIS — M1711 Unilateral primary osteoarthritis, right knee: Secondary | ICD-10-CM | POA: Diagnosis not present

## 2015-12-18 DIAGNOSIS — Z86711 Personal history of pulmonary embolism: Secondary | ICD-10-CM | POA: Diagnosis not present

## 2015-12-18 DIAGNOSIS — I5189 Other ill-defined heart diseases: Secondary | ICD-10-CM | POA: Diagnosis not present

## 2015-12-18 DIAGNOSIS — Z86718 Personal history of other venous thrombosis and embolism: Secondary | ICD-10-CM | POA: Diagnosis not present

## 2015-12-18 MED FILL — ATORVASTATIN 80 MG TABLET: 80 | 90 days supply | Qty: 90 | Fill #0

## 2015-12-18 NOTE — Telephone Encounter (Signed)
DONE by me.

## 2015-12-18 NOTE — Telephone Encounter (Signed)
Left note for pt @  front desk, for pt  to be out of work till 8/14 till pt comes back in for ov.

## 2015-12-18 NOTE — Telephone Encounter (Signed)
New Message:  She need a note on a script pad,stating that her leave is approved thru 01-20-16 Also put on there,she has an appointment on 01-20-16. All this needs to be dated and signed by Dr Martinique please. Any questions please call,call when this is ready and she will pick it up.

## 2015-12-20 DIAGNOSIS — Z86718 Personal history of other venous thrombosis and embolism: Secondary | ICD-10-CM | POA: Diagnosis not present

## 2015-12-20 DIAGNOSIS — Z86711 Personal history of pulmonary embolism: Secondary | ICD-10-CM | POA: Diagnosis not present

## 2015-12-20 DIAGNOSIS — I5189 Other ill-defined heart diseases: Secondary | ICD-10-CM | POA: Diagnosis not present

## 2015-12-20 DIAGNOSIS — G43909 Migraine, unspecified, not intractable, without status migrainosus: Secondary | ICD-10-CM | POA: Diagnosis not present

## 2015-12-20 DIAGNOSIS — E7800 Pure hypercholesterolemia, unspecified: Secondary | ICD-10-CM | POA: Diagnosis not present

## 2015-12-20 DIAGNOSIS — I214 Non-ST elevation (NSTEMI) myocardial infarction: Secondary | ICD-10-CM | POA: Diagnosis not present

## 2015-12-20 DIAGNOSIS — M1711 Unilateral primary osteoarthritis, right knee: Secondary | ICD-10-CM | POA: Diagnosis not present

## 2015-12-20 DIAGNOSIS — M549 Dorsalgia, unspecified: Secondary | ICD-10-CM | POA: Diagnosis not present

## 2015-12-20 DIAGNOSIS — Z8674 Personal history of sudden cardiac arrest: Secondary | ICD-10-CM | POA: Diagnosis not present

## 2015-12-23 MED FILL — SYNTHROID 150 MCG TABLET: 150 | 90 days supply | Qty: 90 | Fill #0

## 2015-12-23 MED FILL — OMEGA-3 ETHYL ESTERS 1 GM C: 1 | 90 days supply | Qty: 360 | Fill #1

## 2015-12-23 MED FILL — rOPINIRole HCL 0.5 MG TABS: 0.5 | 90 days supply | Qty: 90 | Fill #1

## 2015-12-24 ENCOUNTER — Telehealth: Payer: Self-pay | Admitting: Nurse Practitioner

## 2015-12-24 DIAGNOSIS — I214 Non-ST elevation (NSTEMI) myocardial infarction: Secondary | ICD-10-CM | POA: Diagnosis not present

## 2015-12-24 DIAGNOSIS — E7800 Pure hypercholesterolemia, unspecified: Secondary | ICD-10-CM | POA: Diagnosis not present

## 2015-12-24 DIAGNOSIS — Z8674 Personal history of sudden cardiac arrest: Secondary | ICD-10-CM | POA: Diagnosis not present

## 2015-12-24 DIAGNOSIS — I5189 Other ill-defined heart diseases: Secondary | ICD-10-CM | POA: Diagnosis not present

## 2015-12-24 DIAGNOSIS — M549 Dorsalgia, unspecified: Secondary | ICD-10-CM | POA: Diagnosis not present

## 2015-12-24 DIAGNOSIS — M1711 Unilateral primary osteoarthritis, right knee: Secondary | ICD-10-CM | POA: Diagnosis not present

## 2015-12-24 DIAGNOSIS — G43909 Migraine, unspecified, not intractable, without status migrainosus: Secondary | ICD-10-CM | POA: Diagnosis not present

## 2015-12-24 DIAGNOSIS — Z86718 Personal history of other venous thrombosis and embolism: Secondary | ICD-10-CM | POA: Diagnosis not present

## 2015-12-24 DIAGNOSIS — Z86711 Personal history of pulmonary embolism: Secondary | ICD-10-CM | POA: Diagnosis not present

## 2015-12-24 NOTE — Telephone Encounter (Signed)
Mechele Claude is calling because Mrs. Rolfson has a 2lbs weight gain and wanting to know should she take Lasik and her bp tends to be 110/60 and 110/60 at rest . Please call   Thanks

## 2015-12-24 NOTE — Telephone Encounter (Signed)
Mechele Claude from [facility] calling stating Brittany Mason's wt was up 2 lbs from Friday to Sat.  States she ate out at [employer] and know what she ate to cause the wt gain.  Her normal wt is 175 and today wt was 176.2.  No SOB, no edema in ankles.  Lasix was stopped due to low BP.  BP today was 110/60 HR 60. Spoke w/[name],NP who states to call tomorrow with weight and if still elevated may give her dose of Lasix but for her to watch her sodium intake. Notified Mechele Claude to call with wt tomorrow and to limit her sodium intake.  She verbalizes understanding and will call back with wt on Wed 7/19.

## 2015-12-26 DIAGNOSIS — E782 Mixed hyperlipidemia: Secondary | ICD-10-CM | POA: Diagnosis not present

## 2015-12-26 DIAGNOSIS — I252 Old myocardial infarction: Secondary | ICD-10-CM | POA: Diagnosis not present

## 2015-12-26 DIAGNOSIS — D649 Anemia, unspecified: Secondary | ICD-10-CM | POA: Diagnosis not present

## 2015-12-26 DIAGNOSIS — Z79899 Other long term (current) drug therapy: Secondary | ICD-10-CM | POA: Diagnosis not present

## 2015-12-26 DIAGNOSIS — Z8674 Personal history of sudden cardiac arrest: Secondary | ICD-10-CM | POA: Diagnosis not present

## 2015-12-27 MED FILL — MORPHINE SULF ER 30 MG TAB: 30 | 30 days supply | Qty: 60 | Fill #0

## 2016-01-02 DIAGNOSIS — Z760 Encounter for issue of repeat prescription: Secondary | ICD-10-CM | POA: Diagnosis not present

## 2016-01-06 MED FILL — PANTOPRAZOLE SOD DR 40 MG T: 40 | 30 days supply | Qty: 30 | Fill #1

## 2016-01-06 MED FILL — AMOXICILLIN 500 MG CAPSULE: 500 | 3 days supply | Qty: 12 | Fill #0

## 2016-01-09 ENCOUNTER — Encounter (HOSPITAL_COMMUNITY)
Admission: RE | Admit: 2016-01-09 | Discharge: 2016-01-09 | Disposition: A | Discharge status: Home / Self Care | Payer: 59 | Source: Ambulatory Visit | Attending: Cardiovascular Disease | Admitting: Cardiovascular Disease

## 2016-01-09 ENCOUNTER — Encounter (HOSPITAL_COMMUNITY): Payer: Self-pay

## 2016-01-09 VITALS — BP 104/60 | HR 67 | Ht 63.5 in | Wt 174.6 lb

## 2016-01-09 DIAGNOSIS — Z955 Presence of coronary angioplasty implant and graft: Secondary | ICD-10-CM

## 2016-01-09 DIAGNOSIS — I214 Non-ST elevation (NSTEMI) myocardial infarction: Secondary | ICD-10-CM | POA: Insufficient documentation

## 2016-01-09 NOTE — Progress Notes (Signed)
Cardiac Individual Treatment Plan  Patient Details  Name: Brittany Mason MRN: [id number] Date of Birth: January 11, 1951 Referring Provider:   Flowsheet Row CARDIAC REHAB PHASE II ORIENTATION from 01/09/2016 in [place]  Referring Provider  Jenkins Rouge, MD      Initial Encounter Date:  [place] PHASE II ORIENTATION from 01/09/2016 in [place]  Date  01/09/16  Referring Provider  Jenkins Rouge, MD      Visit Diagnosis: 11/19/15 V fib arrrest; NSTEMI (non-ST elevated myocardial infarction) ([place])  12/04/15 Status post coronary artery stent placement  Patient's Home Medications on Admission:  Current Outpatient Prescriptions:  .  aspirin 81 MG tablet, Take 81 mg by mouth daily., Disp: , Rfl:  .  atorvastatin (LIPITOR) 80 MG tablet, 1 tablet (80 mg total) by Per NG tube route daily at 6 PM., Disp: 30 tablet, Rfl: 2 .  butalbital-acetaminophen-caffeine (FIORICET) 50-325-40 MG tablet, Take 1-2 tablets by mouth every 6 (six) hours as needed for headache., Disp: 20 tablet, Rfl: 0 .  clopidogrel (PLAVIX) 75 MG tablet, Take 1 tablet (75 mg total) by mouth daily., Disp: 90 tablet, Rfl: 3 .  cyclobenzaprine (FLEXERIL) 5 MG tablet, Take 5 mg by mouth 3 (three) times daily. , Disp: , Rfl: 1 .  escitalopram (LEXAPRO) 20 MG tablet, Take 20 mg by mouth every morning. , Disp: , Rfl:  .  fenofibrate 160 MG tablet, Take 160 mg by mouth daily. Takes at bedtime, Disp: , Rfl:  .  levothyroxine (SYNTHROID, LEVOTHROID) 150 MCG tablet, Take 150 mcg by mouth daily before breakfast. , Disp: , Rfl:  .  morphine (MSIR) 30 MG tablet, Take 30 mg by mouth every 12 (twelve) hours., Disp: , Rfl:  .  nitroGLYCERIN (NITROSTAT) 0.4 MG SL tablet, Place 1 tablet (0.4 mg total) under the tongue every 5 (five) minutes as needed for chest pain., Disp: 25 tablet, Rfl: 3 .  omega-3 acid ethyl esters (LOVAZA) 1 G capsule, Take 2 g by mouth 2 (two)  times daily., Disp: , Rfl:  .  OVER THE COUNTER MEDICATION, Take 2 capsules by mouth daily. "Relizen" herbal supplement for hormone replacement , Disp: , Rfl:  .  pantoprazole (PROTONIX) 40 MG tablet, Take 40 mg by mouth daily., Disp: , Rfl:  .  rOPINIRole (REQUIP) 0.5 MG tablet, Take 0.5 mg by mouth every evening. , Disp: , Rfl:   Past Medical History: Past Medical History:  Diagnosis Date  . Acute pulmonary embolism ([place]) 06/20/2015  . Arthritis    osteoarthritis-knees. Chronic back pain  . Basal cell carcinoma of right shoulder    "burned off"  . Chronic SI joint pain   . DVT (deep venous thrombosis) ([place]) 04/2013   left lower leg, resulted in Pulmonary emboli on hormone therapy  . Dyspnea on exertion   . Hypercholesterolemia   . Hypothyroidism   . Migraine    "< once/month" (12/04/2015)  . MVP (mitral valve prolapse)    asymptomatic  . Myocardial infarction ([place]) 11/19/2015   cardiac arrest  . PE (pulmonary embolism) Apr 18, 2013   tx. -using Xarelto now, no further lung problems, denies SOB on 01-02-14  . PONV (postoperative nausea and vomiting)   . RLS (restless legs syndrome)     Tobacco Use: History  Smoking Status  . Never Smoker  Smokeless Tobacco  . Never Used    Labs: Recent Review Flowsheet Data    Labs for ITP  Cardiac and Pulmonary Rehab Latest Ref Rng & Units 11/20/2015 11/20/2015 11/20/2015 11/21/2015 11/22/2015   Cholestrol 0 - 200 mg/dL - - - - -   LDLCALC 0 - 99 mg/dL - - - - -   HDL >40 mg/dL - - - - -   Trlycerides <150 mg/dL - 82 - 186(H) -   Hemoglobin A1c 4.8 - 5.6 % - - - - -   PHART 7.350 - 7.450 7.256(L) - 7.157(LL) 7.429 7.276(L)   PCO2ART 35.0 - 45.0 mmHg 46.0(H) - 55.1(H) 27.4(L) 50.4(H)   HCO3 20.0 - 24.0 mEq/L 20.2 - 19.5(L) 17.8(L) 22.7   TCO2 0 - 100 mmol/L 22 - 21 18.7 24.3   ACIDBASEDEF 0.0 - 2.0 mmol/L 6.0(H) - 9.0(H) 5.7(H) 3.1(H)   O2SAT % 99.0 - 97.0 98.8 98.2      Capillary Blood Glucose: Lab Results  Component Value Date    GLUCAP 89 11/25/2015   GLUCAP 116 (H) 11/24/2015   GLUCAP 112 (H) 11/24/2015   GLUCAP 95 11/24/2015   GLUCAP 107 (H) 11/23/2015     Exercise Target Goals: Date: 01/09/16  Exercise Program Goal: Individual exercise prescription set with THRR, safety & activity barriers. Participant demonstrates ability to understand and report RPE using BORG scale, to self-measure pulse accurately, and to acknowledge the importance of the exercise prescription.  Exercise Prescription Goal: Starting with aerobic activity 30 plus minutes a day, 3 days per week for initial exercise prescription. Provide home exercise prescription and guidelines that participant acknowledges understanding prior to discharge.  Activity Barriers & Risk Stratification:     Activity Barriers & Cardiac Risk Stratification - 01/09/16 1004      Activity Barriers & Cardiac Risk Stratification   Activity Barriers Back Problems;Left Knee Replacement   Cardiac Risk Stratification High      6 Minute Walk:     6 Minute Walk    Row Name 01/09/16 1342         6 Minute Walk   Phase Initial     Distance 1603 feet     Walk Time 6 minutes     # of Rest Breaks 0     MPH 3.03     METS 3.83     RPE 12     VO2 Peak 13.41     Symptoms No     Resting HR 67 bpm     Resting BP 104/60     Max Ex. HR 110 bpm     Max Ex. BP 142/78     2 Minute Post BP 100/60        Initial Exercise Prescription:     Initial Exercise Prescription - 01/09/16 1300      Date of Initial Exercise RX and Referring Provider   Date 01/09/16   Referring Provider Jenkins Rouge, MD     Treadmill   MPH 2.6   Grade 1   Minutes 10   METs 3.35     Bike   Level 1   Minutes 10   METs 3.36     NuStep   Level 3   Minutes 10   METs 2     Prescription Details   Frequency (times per week) 3   Duration Progress to 30 minutes of continuous aerobic without signs/symptoms of physical distress     Intensity   THRR 40-80% of Max Heartrate 62-123    Ratings of Perceived Exertion 11-13   Perceived Dyspnea 0-4     Progression  Progression Continue to progress workloads to maintain intensity without signs/symptoms of physical distress.     Resistance Training   Training Prescription Yes   Weight 2lbs   Reps 10-12      Perform Capillary Blood Glucose checks as needed.  Exercise Prescription Changes:   Exercise Comments:   Discharge Exercise Prescription (Final Exercise Prescription Changes):   Nutrition:  Target Goals: Understanding of nutrition guidelines, daily intake of sodium 1500mg , cholesterol 200mg , calories 30% from fat and 7% or less from saturated fats, daily to have 5 or more servings of fruits and vegetables.  Biometrics:     Pre Biometrics - 01/09/16 1442      Pre Biometrics   Height 5' 3.5" (1.613 m)   Weight 174 lb 9.7 oz (79.2 kg)   Waist Circumference 41 inches   Hip Circumference 45 inches   Waist to Hip Ratio 0.91 %   BMI (Calculated) 30.5   Grip Strength 33 kg   Flexibility 13 in   Single Leg Stand 30 seconds       Nutrition Therapy Plan and Nutrition Goals:   Nutrition Discharge: Nutrition Scores:   Nutrition Goals Re-Evaluation:   Psychosocial: Target Goals: Acknowledge presence or absence of depression, maximize coping skills, provide positive support system. Participant is able to verbalize types and ability to use techniques and skills needed for reducing stress and depression.  Initial Review & Psychosocial Screening:   Quality of Life Scores:     Quality of Life - 01/09/16 1419      Quality of Life Scores   Health/Function Pre 23.67 %   Socioeconomic Pre 27.57 %   Psych/Spiritual Pre 23.36 %   Family Pre 28 %   GLOBAL Pre 24.86 %      PHQ-9: Recent Review Flowsheet Data    There is no flowsheet data to display.      Psychosocial Evaluation and Intervention:   Psychosocial Re-Evaluation:   Vocational Rehabilitation: Provide vocational rehab  assistance to qualifying candidates.   Vocational Rehab Evaluation & Intervention:     Vocational Rehab - 01/09/16 1546      Initial Vocational Rehab Evaluation & Intervention   Assessment shows need for Vocational Rehabilitation No  Pt feels she will be able to return back to work without any difficulty.      Education: Education Goals: Education classes will be provided on a weekly basis, covering required topics. Participant will state understanding/return demonstration of topics presented.  Learning Barriers/Preferences:     Learning Barriers/Preferences - 01/09/16 1445      Learning Barriers/Preferences   Learning Barriers Sight   Learning Preferences Skilled Demonstration;Pictoral;Video      Education Topics: Count Your Pulse:  -Group instruction provided by verbal instruction, demonstration, patient participation and written materials to support subject.  Instructors address importance of being able to find your pulse and how to count your pulse when at home without a heart monitor.  Patients get hands on experience counting their pulse with staff help and individually.   Heart Attack, Angina, and Risk Factor Modification:  -Group instruction provided by verbal instruction, video, and written materials to support subject.  Instructors address signs and symptoms of angina and heart attacks.    Also discuss risk factors for heart disease and how to make changes to improve heart health risk factors.   Functional Fitness:  -Group instruction provided by verbal instruction, demonstration, patient participation, and written materials to support subject.  Instructors address safety measures for doing things around  the house.  Discuss how to get up and down off the floor, how to pick things up properly, how to safely get out of a chair without assistance, and balance training.   Meditation and Mindfulness:  -Group instruction provided by verbal instruction, patient  participation, and written materials to support subject.  Instructor addresses importance of mindfulness and meditation practice to help reduce stress and improve awareness.  Instructor also leads participants through a meditation exercise.    Stretching for Flexibility and Mobility:  -Group instruction provided by verbal instruction, patient participation, and written materials to support subject.  Instructors lead participants through series of stretches that are designed to increase flexibility thus improving mobility.  These stretches are additional exercise for major muscle groups that are typically performed during regular warm up and cool down.   Hands Only CPR Anytime:  -Group instruction provided by verbal instruction, video, patient participation and written materials to support subject.  Instructors co-teach with AHA video for hands only CPR.  Participants get hands on experience with mannequins.   Nutrition I class: Heart Healthy Eating:  -Group instruction provided by PowerPoint slides, verbal discussion, and written materials to support subject matter. The instructor gives an explanation and review of the Therapeutic Lifestyle Changes diet recommendations, which includes a discussion on lipid goals, dietary fat, sodium, fiber, plant stanol/sterol esters, sugar, and the components of a well-balanced, healthy diet.   Nutrition II class: Lifestyle Skills:  -Group instruction provided by PowerPoint slides, verbal discussion, and written materials to support subject matter. The instructor gives an explanation and review of label reading, grocery shopping for heart health, heart healthy recipe modifications, and ways to make healthier choices when eating out.   Diabetes Question & Answer:  -Group instruction provided by PowerPoint slides, verbal discussion, and written materials to support subject matter. The instructor gives an explanation and review of diabetes co-morbidities, pre- and  post-prandial blood glucose goals, pre-exercise blood glucose goals, signs, symptoms, and treatment of hypoglycemia and hyperglycemia, and foot care basics.   Diabetes Blitz:  -Group instruction provided by PowerPoint slides, verbal discussion, and written materials to support subject matter. The instructor gives an explanation and review of the physiology behind type 1 and type 2 diabetes, diabetes medications and rational behind using different medications, pre- and post-prandial blood glucose recommendations and Hemoglobin A1c goals, diabetes diet, and exercise including blood glucose guidelines for exercising safely.    Portion Distortion:  -Group instruction provided by PowerPoint slides, verbal discussion, written materials, and food models to support subject matter. The instructor gives an explanation of serving size versus portion size, changes in portions sizes over the last 20 years, and what consists of a serving from each food group.   Stress Management:  -Group instruction provided by verbal instruction, video, and written materials to support subject matter.  Instructors review role of stress in heart disease and how to cope with stress positively.     Exercising on Your Own:  -Group instruction provided by verbal instruction, power point, and written materials to support subject.  Instructors discuss benefits of exercise, components of exercise, frequency and intensity of exercise, and end points for exercise.  Also discuss use of nitroglycerin and activating EMS.  Review options of places to exercise outside of rehab.  Review guidelines for sex with heart disease.   Cardiac Drugs I:  -Group instruction provided by verbal instruction and written materials to support subject.  Instructor reviews cardiac drug classes: antiplatelets, anticoagulants, beta blockers, and statins.  Instructor discusses reasons, side effects, and lifestyle considerations for each drug class.   Cardiac  Drugs II:  -Group instruction provided by verbal instruction and written materials to support subject.  Instructor reviews cardiac drug classes: angiotensin converting enzyme inhibitors (ACE-I), angiotensin II receptor blockers (ARBs), nitrates, and calcium channel blockers.  Instructor discusses reasons, side effects, and lifestyle considerations for each drug class.   Anatomy and Physiology of the Circulatory System:  -Group instruction provided by verbal instruction, video, and written materials to support subject.  Reviews functional anatomy of heart, how it relates to various diagnoses, and what role the heart plays in the overall system.   Knowledge Questionnaire Score:     Knowledge Questionnaire Score - 01/09/16 1443      Knowledge Questionnaire Score   Pre Score 23/24      Core Components/Risk Factors/Patient Goals at Admission:     Personal Goals and Risk Factors at Admission - 01/09/16 1004      Core Components/Risk Factors/Patient Goals on Admission    Weight Management Yes;Weight Loss   Intervention Weight Management: Develop a combined nutrition and exercise program designed to reach desired caloric intake, while maintaining appropriate intake of nutrient and fiber, sodium and fats, and appropriate energy expenditure required for the weight goal.;Weight Management: Provide education and appropriate resources to help participant work on and attain dietary goals.;Weight Management/Obesity: Establish reasonable short term and long term weight goals.   Expected Outcomes Short Term: Continue to assess and modify interventions until short term weight is achieved;Weight Maintenance: Understanding of the daily nutrition guidelines, which includes 25-35% calories from fat, 7% or less cal from saturated fats, less than 200mg  cholesterol, less than 1.5gm of sodium, & 5 or more servings of fruits and vegetables daily;Long Term: Adherence to nutrition and physical activity/exercise program  aimed toward attainment of established weight goal;Weight Loss: Understanding of general recommendations for a balanced deficit meal plan, which promotes 1-2 lb weight loss per week and includes a negative energy balance of [phone] kcal/d;Understanding recommendations for meals to include 15-35% energy as protein, 25-35% energy from fat, 35-60% energy from carbohydrates, less than 200mg  of dietary cholesterol, 20-35 gm of total fiber daily;Understanding of distribution of calorie intake throughout the day with the consumption of 4-5 meals/snacks   Increase Strength and Stamina Yes   Intervention Provide advice, education, support and counseling about physical activity/exercise needs.;Develop an individualized exercise prescription for aerobic and resistive training based on initial evaluation findings, risk stratification, comorbidities and participant's personal goals.   Expected Outcomes Achievement of increased cardiorespiratory fitness and enhanced flexibility, muscular endurance and strength shown through measurements of functional capacity and personal statement of participant.   Improve shortness of breath with ADL's Yes   Intervention Provide education, individualized exercise plan and daily activity instruction to help decrease symptoms of SOB with activities of daily living.   Expected Outcomes Short Term: Achieves a reduction of symptoms when performing activities of daily living.   Hypertension Yes   Intervention Provide education on lifestyle modifcations including regular physical activity/exercise, weight management, moderate sodium restriction and increased consumption of fresh fruit, vegetables, and low fat dairy, alcohol moderation, and smoking cessation.;Monitor prescription use compliance.   Expected Outcomes Short Term: Continued assessment and intervention until BP is < 140/37mm HG in hypertensive participants. < 130/52mm HG in hypertensive participants with diabetes, heart failure or  chronic kidney disease.;Long Term: Maintenance of blood pressure at goal levels.   Lipids Yes   Intervention Provide education and support for participant  on nutrition & aerobic/resistive exercise along with prescribed medications to achieve LDL 70mg , HDL >40mg .   Expected Outcomes Long Term: Cholesterol controlled with medications as prescribed, with individualized exercise RX and with personalized nutrition plan. Value goals: LDL < 70mg , HDL > 40 mg.;Short Term: Participant states understanding of desired cholesterol values and is compliant with medications prescribed. Participant is following exercise prescription and nutrition guidelines.   Stress Yes   Intervention Offer individual and/or small group education and counseling on adjustment to heart disease, stress management and health-related lifestyle change. Teach and support self-help strategies.;Refer participants experiencing significant psychosocial distress to appropriate mental health specialists for further evaluation and treatment. When possible, include family members and significant others in education/counseling sessions.   Expected Outcomes Short Term: Participant demonstrates changes in health-related behavior, relaxation and other stress management skills, ability to obtain effective social support, and compliance with psychotropic medications if prescribed.;Long Term: Emotional wellbeing is indicated by absence of clinically significant psychosocial distress or social isolation.   Personal Goal Other Yes   Personal Goal short: increase stamina, be able to walk for 30 minutes without rest breaks or fatigue  long: get back to [place], improve confidence and know that heart is strong enough for activities/exercise   Intervention Provide education on cardiac disease, and provide exercise programming to improve cardiovascular fitness and confidence   Expected Outcomes Pt will be able to return to [facility] and be able to walk for 30  minutes without rest break or extreme fatigue.      Core Components/Risk Factors/Patient Goals Review:    Core Components/Risk Factors/Patient Goals at Discharge (Final Review):    ITP Comments:     ITP Comments    Row Name 01/09/16 1001           ITP Comments Dr. Fransico Him, Medical Director          Comments: Pt in today for cardiac rehab orientation from 0800 - 1130.  As a part of cardiac rehab orientation, pt completed 6 minute walk test.  Pt tolerated well with no complaints of cp or sob.  Monitor showed Sr with no noted ectopy.  Pt is excited to get started with cardiac rehab. Cherre Huger, BSN

## 2016-01-09 NOTE — Progress Notes (Signed)
Cardiac Rehab Medication Review by a Pharmacist  Does the patient  feel that his/her medications are working for him/her?  yes  Has the patient been experiencing any side effects to the medications prescribed?  Yes, leg pain with lipitor  Does the patient measure his/her own blood pressure or blood glucose at home?  yes   Does the patient have any problems obtaining medications due to transportation or finances?   no  Understanding of regimen: good Understanding of indications: good Potential of compliance: good    Pharmacist comments: Brittany Mason is a 65 yo female who present to cardiac rehab without assistance. Patient complains of severe leg pain that keeps her up at night. Patient advised to speak with physician about changing her statin.    Brittany Mason D Pharmacy Resident 01/09/2016 9:22 AM

## 2016-01-13 ENCOUNTER — Encounter (HOSPITAL_COMMUNITY)
Admission: RE | Admit: 2016-01-13 | Discharge: 2016-01-13 | Disposition: A | Discharge status: Home / Self Care | Payer: 59 | Source: Ambulatory Visit | Attending: Cardiovascular Disease | Admitting: Cardiovascular Disease

## 2016-01-13 VITALS — Ht 63.5 in | Wt 174.6 lb

## 2016-01-13 DIAGNOSIS — I214 Non-ST elevation (NSTEMI) myocardial infarction: Secondary | ICD-10-CM | POA: Diagnosis not present

## 2016-01-13 DIAGNOSIS — Z955 Presence of coronary angioplasty implant and graft: Secondary | ICD-10-CM

## 2016-01-13 MED FILL — FENOFIBRATE 160 MG TABLET: 160 | 90 days supply | Qty: 90 | Fill #1

## 2016-01-13 NOTE — Progress Notes (Signed)
Daily Session Note  Patient Details  Name: Brittany Mason MRN: [id number] Date of Birth: 03/07/1951 Referring Provider:   Flowsheet Row CARDIAC REHAB PHASE II ORIENTATION from 01/09/2016 in [place]  Referring Provider  Jenkins Rouge, MD      Encounter Date: 01/13/2016  Check In:     Session Check In - 01/13/16 1514      Check-In   Location [facility]   Staff Present Seward Carol, MS, ACSM CEP, Exercise Physiologist;Tane Biegler, RN, BSN;Amber Fair, MS, ACSM RCEP, Exercise Physiologist   Supervising physician immediately available to respond to emergencies [facility] immediately available   Physician(s) Dr. Nehemiah Settle   Medication changes reported     No   Fall or balance concerns reported    No   Warm-up and Cool-down Performed as group-led instruction   Resistance Training Performed Yes   VAD Patient? No     Pain Assessment   Currently in Pain? No/denies   Multiple Pain Sites No      Capillary Blood Glucose: No results found for this or any previous visit (from the past 24 hour(s)).   Goals Met:  Exercise tolerated well  Goals Unmet:  Not Applicable  Comments: Pt started cardiac rehab today.  Pt tolerated light exercise. VSS, telemetry-Sinus Rhythm with PVC's , asymptomatic.  Medication list reconciled. Brittany Mason was switched to Zocor by Dr Rex Kras due to complaints of myalgia.  Pt denies barriers to medicaiton compliance.  PSYCHOSOCIAL ASSESSMENT:  PHQ-0. Pt exhibits positive coping skills, hopeful outlook with supportive family. No psychosocial needs identified at this time, no psychosocial interventions necessary.    Pt enjoys Kayaking and reading.  Brittany did have some difficulty on the airdyne due to her previous knee surgery. Once Anabelen was switched to the nustep she tolerated exercise better.  Pt oriented to exercise equipment and routine.    Understanding verbalized.Brittany Pall, RN,BSN 01/13/2016 4:26  PM   Dr. Fransico Him is Medical Director for Cardiac Rehab at [facility].

## 2016-01-15 ENCOUNTER — Encounter (HOSPITAL_COMMUNITY)
Admission: RE | Admit: 2016-01-15 | Discharge: 2016-01-15 | Disposition: A | Discharge status: Home / Self Care | Payer: 59 | Source: Ambulatory Visit | Attending: Cardiovascular Disease | Admitting: Cardiovascular Disease

## 2016-01-15 ENCOUNTER — Telehealth: Payer: Self-pay | Admitting: Physician Assistant

## 2016-01-15 DIAGNOSIS — I214 Non-ST elevation (NSTEMI) myocardial infarction: Secondary | ICD-10-CM

## 2016-01-15 DIAGNOSIS — Z955 Presence of coronary angioplasty implant and graft: Secondary | ICD-10-CM

## 2016-01-15 NOTE — Telephone Encounter (Signed)
Paged by cardiac rehab as patient had some PVCs, strips reviewed PVCs and short runs of bigeminy but no NSVT. Would observe for now. Ideally she should be on BB, however her BP is borderline.  Hilbert Corrigan PA Pager: [phone]

## 2016-01-15 NOTE — Progress Notes (Signed)
Intermittent frequent PVC's noted today at cardiac rehab on the nustep. Patient asymptomatic. Blood pressure 122/70, heart rate 97. PVc's improved after moving to the arm ergometer. Recheck blood pressure 98/60. Patient encouraged to drink water. Telemetry rhythm Sinus. Loma Sousa [facility] paged and notified. Hal reviewed today's ECG tracing. Hal recommended that Miss Roussin increase her fluid intake. No other orders received. Post exercise blood pressure 09/60. Patient was given Gatorade. Recheck blood pressure 106/70 sitting. Standing blood pressure 118/75. Patient had no other questions upon exit from cardiac rehab. Will continue to monitor the patient throughout  the program.Phyllis Whitefield Venetia Maxon, RN,BSN 01/15/2016 5:21 PM

## 2016-01-17 ENCOUNTER — Encounter (HOSPITAL_COMMUNITY)
Admission: RE | Admit: 2016-01-17 | Discharge: 2016-01-17 | Disposition: A | Discharge status: Home / Self Care | Payer: 59 | Source: Ambulatory Visit | Attending: Cardiovascular Disease | Admitting: Cardiovascular Disease

## 2016-01-17 DIAGNOSIS — I214 Non-ST elevation (NSTEMI) myocardial infarction: Secondary | ICD-10-CM

## 2016-01-17 DIAGNOSIS — Z955 Presence of coronary angioplasty implant and graft: Secondary | ICD-10-CM

## 2016-01-20 ENCOUNTER — Ambulatory Visit (INDEPENDENT_AMBULATORY_CARE_PROVIDER_SITE_OTHER): Payer: 59 | Admitting: Nurse Practitioner

## 2016-01-20 ENCOUNTER — Encounter: Payer: Self-pay | Admitting: Nurse Practitioner

## 2016-01-20 ENCOUNTER — Encounter (HOSPITAL_COMMUNITY)
Admission: RE | Admit: 2016-01-20 | Discharge: 2016-01-20 | Disposition: A | Discharge status: Home / Self Care | Payer: 59 | Source: Ambulatory Visit | Attending: Cardiovascular Disease | Admitting: Cardiovascular Disease

## 2016-01-20 VITALS — BP 100/70 | HR 63 | Ht 63.0 in | Wt 172.1 lb

## 2016-01-20 DIAGNOSIS — I255 Ischemic cardiomyopathy: Secondary | ICD-10-CM | POA: Diagnosis not present

## 2016-01-20 DIAGNOSIS — Z955 Presence of coronary angioplasty implant and graft: Secondary | ICD-10-CM

## 2016-01-20 DIAGNOSIS — I469 Cardiac arrest, cause unspecified: Secondary | ICD-10-CM | POA: Diagnosis not present

## 2016-01-20 DIAGNOSIS — E785 Hyperlipidemia, unspecified: Secondary | ICD-10-CM

## 2016-01-20 DIAGNOSIS — I214 Non-ST elevation (NSTEMI) myocardial infarction: Secondary | ICD-10-CM

## 2016-01-20 LAB — BASIC METABOLIC PANEL
BUN: 17 mg/dL (ref 7–25)
CO2: 30 mmol/L (ref 20–31)
Calcium: 10 mg/dL (ref 8.6–10.4)
Chloride: 104 mmol/L (ref 98–110)
Creat: 0.84 mg/dL (ref 0.50–0.99)
Glucose, Bld: 96 mg/dL (ref 65–99)
Potassium: 4.3 mmol/L (ref 3.5–5.3)
Sodium: 141 mmol/L (ref 135–146)

## 2016-01-20 LAB — MAGNESIUM: Magnesium: 2 mg/dL (ref 1.5–2.5)

## 2016-01-20 LAB — TSH: TSH: 0.39 mIU/L — ABNORMAL LOW

## 2016-01-20 MED ORDER — ROSUVASTATIN CALCIUM 10 MG PO TABS: 10.0000 mg | ORAL_TABLET | Freq: Every day | ORAL | 3 refills | Status: DC

## 2016-01-20 MED ORDER — METOPROLOL TARTRATE 25 MG PO TABS: 12.5000 mg | ORAL_TABLET | Freq: Two times a day (BID) | ORAL | 3 refills | Status: DC

## 2016-01-20 MED FILL — ROSUVASTATIN CALCIUM 10 MG: 10 | 90 days supply | Qty: 90 | Fill #0

## 2016-01-20 MED FILL — METOPROLOL TARTRATE 25 MG T: 25 | 90 days supply | Qty: 90 | Fill #0

## 2016-01-20 NOTE — Progress Notes (Signed)
CARDIOLOGY OFFICE NOTE  Date:  01/20/2016    [name] Date of Birth: Feb 25, 1951 Medical Record [id number]  PCP:  Gennette Pac, MD  Cardiologist:  Gillian Shields    Chief Complaint  Patient presents with  . Coronary Artery Disease    Follow up visit - seen for Dr. Johnsie Cancel    History of Present Illness: Brittany Mason is a 65 y.o. female who presents today for a follow up visit. Seen for Dr. Johnsie Cancel. She is switching from Dr. Gwenlyn Found.   She has a history of pulmonary embolism on prior chronic anticoagulation (in the setting of HRT), mitral valve prolapse, hypothyroidism, hypercholesterolemia, and restless leg syndrome who presented in June 2017 with VF cardiac arrest at home - with 16 minutes of down-time. Cath on 6/14 showing total occlusion of prox circumflex (conservative management favored at that time due to cooling protocol and further neurologic recovery). She was not seen by EP. She had been seen here back in March with atypical chest pain - had a normal stress test back in March of 2017. Strong + FH for CAD.  She was discharged 11/25/2015.    I have seen her back several times - got her referred back for early PCI of the LCX by Dr. Martinique which was successful. Her memory had improved. She is not to drive for 6 months. Is in cardiac rehab now.   Phone call last week from rehab - some PVCs noted - she is not on beta blocker due to soft BP. EF has improved. Life Vest was discontinued.   Comes in today. Here with her "best friend". Her name is Brittany Mason.  She is doing well. No chest pain. Not short of breath. Not lightheaded or dizzy. Has had some PVCs noted in cardiac rehab. Not symptomatic. Her statin has been changed.           Past Medical History:  Diagnosis Date  . Acute pulmonary embolism ([place]) 06/20/2015  . Arthritis    osteoarthritis-knees. Chronic back pain  . Basal cell carcinoma of right shoulder    "burned off"  . Chronic SI joint pain     . DVT (deep venous thrombosis) ([place]) 04/2013   left lower leg, resulted in Pulmonary emboli on hormone therapy  . Dyspnea on exertion   . Hypercholesterolemia   . Hypothyroidism   . Migraine    "< once/month" (12/04/2015)  . MVP (mitral valve prolapse)    asymptomatic  . Myocardial infarction ([place]) 11/19/2015   cardiac arrest  . PE (pulmonary embolism) Apr 18, 2013   tx. -using Xarelto now, no further lung problems, denies SOB on 01-02-14  . PONV (postoperative nausea and vomiting)   . RLS (restless legs syndrome)     Past Surgical History:  Procedure Laterality Date  . ANTERIOR CRUCIATE LIGAMENT REPAIR Left 1976; 1981   "open"  . CARDIAC CATHETERIZATION N/A 11/20/2015   Procedure: Left Heart Cath and Coronary Angiography;  Surgeon: Belva Crome, MD;  Location: [facility];  Service: Cardiovascular;  Laterality: N/A;  . CARDIAC CATHETERIZATION N/A 12/04/2015   Procedure: Left Heart Cath and Coronary Angiography;  Surgeon: Peter M Martinique, MD;  Location: [facility];  Service: Cardiovascular;  Laterality: N/A;  . CARDIAC CATHETERIZATION  12/04/2015   Procedure: Coronary Stent Intervention;  Surgeon: Peter M Martinique, MD;  Location: [facility];  Service: Cardiovascular;;  . COLONOSCOPY  09/28/2011   Procedure: COLONOSCOPY;  Surgeon: Mar Daring  Collene Mares, MD;  Location: [facility];  Service: Endoscopy;  Laterality: N/A;  . JOINT REPLACEMENT    . TOTAL ABDOMINAL HYSTERECTOMY  1998  . TOTAL KNEE ARTHROPLASTY Left 01/08/2014   Procedure: LEFT TOTAL KNEE ARTHROPLASTY;  Surgeon: Gearlean Alf, MD;  Location: [facility];  Service: Orthopedics;  Laterality: Left;     Medications: Current Outpatient Prescriptions  Medication Sig Dispense Refill  . aspirin 81 MG tablet Take 81 mg by mouth daily.    . butalbital-acetaminophen-caffeine (FIORICET) 50-325-40 MG tablet Take 1-2 tablets by mouth every 6 (six) hours as needed for headache. 20 tablet 0  . clopidogrel (PLAVIX) 75 MG tablet  Take 1 tablet (75 mg total) by mouth daily. 90 tablet 3  . cyclobenzaprine (FLEXERIL) 5 MG tablet Take 5 mg by mouth 3 (three) times daily.   1  . escitalopram (LEXAPRO) 20 MG tablet Take 20 mg by mouth every morning.     . fenofibrate 160 MG tablet Take 160 mg by mouth daily. Takes at bedtime    . levothyroxine (SYNTHROID, LEVOTHROID) 150 MCG tablet Take 150 mcg by mouth daily before breakfast.     . morphine (MSIR) 30 MG tablet Take 30 mg by mouth every 12 (twelve) hours.    . nitroGLYCERIN (NITROSTAT) 0.4 MG SL tablet Place 1 tablet (0.4 mg total) under the tongue every 5 (five) minutes as needed for chest pain. 25 tablet 3  . omega-3 acid ethyl esters (LOVAZA) 1 G capsule Take 2 g by mouth 2 (two) times daily.    Marland Kitchen OVER THE COUNTER MEDICATION Take 2 capsules by mouth daily. "Relizen" herbal supplement for hormone replacement     . pantoprazole (PROTONIX) 40 MG tablet Take 40 mg by mouth daily.    Marland Kitchen rOPINIRole (REQUIP) 0.5 MG tablet Take 0.5 mg by mouth every evening.     . rosuvastatin (CRESTOR) 10 MG tablet Take 1 tablet (10 mg total) by mouth daily. 90 tablet 3   No current facility-administered medications for this visit.     Allergies: Allergies  Allergen Reactions  . Codeine Nausea And Vomiting    Takes hydrocodone; if takes doses too close together, states "violently throws up"  . Erythromycin Hives and Itching    Social History: The patient  reports that she has never smoked. She has never used smokeless tobacco. She reports that she drinks alcohol. She reports that she does not use drugs.   Family History: [name] family history includes Breast cancer in her maternal aunt and paternal aunt; Cancer in her maternal aunt; Congestive Heart Failure in her mother; Coronary artery disease in her father; Hypertension in her father and mother; Stroke in her brother.   Review of Systems: Please see the history of present illness.   Otherwise, the review of systems is positive  for none.   All other systems are reviewed and negative.   Physical Exam: VS:  BP 100/70   Pulse 63   Ht 5\' 3"  (1.6 m)   Wt 172 lb 1.9 oz (78.1 kg)   SpO2 96% Comment: at rest  BMI 30.49 kg/m  .  BMI Body mass index is 30.49 kg/m.  Wt Readings from Last 3 Encounters:  01/20/16 172 lb 1.9 oz (78.1 kg)  01/13/16 174 lb 9.7 oz (79.2 kg)  01/09/16 174 lb 9.7 oz (79.2 kg)    General: Pleasant. Well developed, well nourished and in no acute distress.   HEENT: Normal.  Neck: Supple, no JVD, carotid bruits, or  masses noted.  Cardiac: Regular rate and rhythm. No murmurs, rubs, or gallops. No edema.  Respiratory:  Lungs are clear to auscultation bilaterally with normal work of breathing.  GI: Soft and nontender.  MS: No deformity or atrophy. Gait and ROM intact.  Skin: Warm and dry. Color is normal.  Neuro:  Strength and sensation are intact and no gross focal deficits noted.  Psych: Alert, appropriate and with normal affect.   LABORATORY DATA:  EKG:  EKG is not ordered today.  Lab Results  Component Value Date   WBC 4.5 12/05/2015   HGB 11.2 (L) 12/05/2015   HCT 35.5 (L) 12/05/2015   PLT 527 (H) 12/05/2015   GLUCOSE 101 (H) 12/05/2015   CHOL 181 11/19/2015   TRIG 186 (H) 11/21/2015   HDL 66 11/19/2015   LDLCALC 94 11/19/2015   ALT 58 (H) 12/02/2015   AST 32 12/02/2015   NA 138 12/05/2015   K 4.3 12/05/2015   CL 105 12/05/2015   CREATININE 0.80 12/05/2015   BUN 14 12/05/2015   CO2 28 12/05/2015   TSH 5.260 (H) 11/19/2015   INR 1.1 12/02/2015   HGBA1C 5.7 (H) 11/19/2015    BNP (last 3 results)  Recent Labs  11/19/15 2335 12/02/15 1058  BNP 80.0 170.6*    ProBNP (last 3 results) No results for input(s): PROBNP in the last 8760 hours.   Other Studies Reviewed Today:  Cardiac Cath/PCI Conclusion from 12/04/15     Prox RCA to Mid RCA lesion, 30% stenosed.  RPDA lesion, 60% stenosed.  Prox Cx to Mid Cx lesion, 100% stenosed.  There is mild left  ventricular systolic dysfunction.  Prox Cx lesion, 99% stenosed. Post intervention, there is a 0% residual stenosis.  1. Single vessel obstructive CAD 2. Mild LV dysfunction. 3. Low LVEDP 4. Successful stenting of the proximal LCx into a large bifurcating OM1. The distal LCx is still occluded with right to left collaterals. This branch is relatively small.   Plan: DAPT for one year. Will stop Lasix. Given improvement in EF would not recommend Life Vest at this point. Anticipate DC in am.      Cardiac Catheterization: 11/20/2015  Prox RCA to Dist RCA lesion, 45% stenosed.  RPDA lesion, 65% stenosed.  Prox Cx to Mid Cx lesion, 100% stenosed.   Total occlusion (Acute vs Chronic) of the proximal circumflex with collaterals from left to left and right to left. The collaterals appear to be meager however, the patient is on the cooling protocol and I suspect that collateralization will significantly improved once the patient is back to body temperature. If angina develops post arrest, recanalization of the circumflex coronary artery would be reasonable.  Diffuse moderate stenosis in a large PDA, up to 70% obstruction. Relatively small distribution LAD that reaches, but does not wrap around, the left ventricular apex. No significant obstruction is noted in the LAD.  Mid inferior wall moderate hypokinesis. Estimated ejection fraction 45-50%. Marked elevation in left ventricular end-diastolic pressure of 40 mmHg secondary to acute ischemia and cooling.   Recommendations:  Conservative medical management for the time being.  I would add Plavix to the patient's medical regimen.  Should the patient have a significant neurological recovery, angina/ongoing evidence of ischemia could be treated with circumflex PCI. We chose against PCI today given cooling, vasoconstriction related to cooling, and clinical stability at the present time without evidence of ongoing  ischemia.   Echocardiogram: 11/20/2015 Study Conclusions  - Left ventricle: The cavity size was  normal. There was mild focal  basal hypertrophy of the septum. Systolic function was moderately  reduced. The estimated ejection fraction was in the range of 35%  to 40%. Diffuse hypokinesis. There is akinesis of the  mid-apicalinferolateral and inferior myocardium. Doppler  parameters are consistent with abnormal left ventricular  relaxation (grade 1 diastolic dysfunction). - Aortic valve: Trileaflet; mildly thickened, mildly calcified  leaflets. There was mild regurgitation directed eccentrically in  the LVOT. - Aorta: Aortic root dimension: 38 mm (ED). - Ascending aorta: The ascending aorta was mildly dilated. - Mitral valve: Calcified annulus. - Right ventricle: The cavity size was moderately dilated. Wall  thickness was normal. Systolic function was moderately reduced.  Assessment/Plan: 1. V-fib Cardiac Arrest/ NSTEMI - with LV dysfunction - Cath on 6/14 showed total occlusion of prox circumflex (conservative management favored at that time due to cooling protocol and to assess for further neurologic recovery). She is now s/p PCI to the LCX. Mild LV dysfunction noted at time of repeat cath with EF of 45 to 50%. Has been referred to cardiac rehab.  Not able to drive for 6 months post arrest - this was reiterated again today after discussion with Dr. Curt Bears here in the office today. Will return to work next Monday - with restriction for time to go to cardiac rehab which may entail needing to work from home.   2. Ischemic CM - mild LV dysfunction noted on repeat cath. Life Vest is off. Doing very well clinically. Would consider repeat echo 3 months post PCI to recheck. Trying to add low dose beta blocker.   3. Hypokalemia - potassium 4.5 on recent lab by PCP - rechecking today.   4. HLD - now on statin therapy - back on Zocor due to aching with Lipitor - would favor trying  Crestor instead.   5. Elevated LFTs - improved on last recheck.    6. Short term memory issues - greatly improved.   7. PVCs - discussed with Dr. Curt Bears - her arrest was felt to be related to CAD/acute MI - will try to add low dose beta blocker. [place] lab. Plan for repeat echo. EF has improved.   Will allow her to return to work. Lab today.   Current medicines are reviewed with the patient today.  The patient does not have concerns regarding medicines other than what has been noted above.  The following changes have been made:  See above.  Labs/ tests ordered today include:    Orders Placed This Encounter  Procedures  . Basic metabolic panel  . TSH  . Magnesium     Disposition:   FU with Dr. Johnsie Cancel in September.   Patient is agreeable to this plan and will call if any problems develop in the interim.   Signed: Burtis Junes, RN, ANP-C 01/20/2016 9:46 AM  [facility] [address] [place], [place]  [address] Phone: [phone] Fax: [phone]

## 2016-01-20 NOTE — Progress Notes (Signed)
Reviewed home exercise guidelines with patient including endpoints, temperature precautions, target heart rate and rate of perceived exertion. Pt is walking 20-30 minutes on the days she doesn't come to cardiac rehab as her mode of home exercise. Pt also plans to look into using the fitness room at work. Pt voices understanding of instructions given. Sol Passer, MS, ACSM CCEP

## 2016-01-20 NOTE — Patient Instructions (Addendum)
We will be checking the following labs today - BMET, MG, TSH  Medication Instructions:    Continue with your current medicines. BUT  I am stopping the Zocor  I am adding Crestor 10 mg a day - this is at the drug store  I am adding Metoprolol 12.5 mg to take twice a day - this is a beta blocker to help with the PVCs and hopefully not lower your blood pressure.    Testing/Procedures To Be Arranged:  N/A  Follow-Up:   See Dr. Johnsie Cancel next month as planned.     Other Special Instructions:   N/A    If you need a refill on your cardiac medications before your next appointment, please call your pharmacy.   Call the [place] office at [phone] if you have any questions, problems or concerns.

## 2016-01-21 NOTE — Progress Notes (Signed)
Cardiac Individual Treatment Plan  Patient Details  Name: Brittany Mason MRN: [id number] Date of Birth: Dec 14, 1950 Referring Provider:   Flowsheet Row CARDIAC REHAB PHASE II ORIENTATION from 01/09/2016 in [place]  Referring Provider  Jenkins Rouge, MD      Initial Encounter Date:  [place] PHASE II ORIENTATION from 01/09/2016 in [facility]  Date  01/09/16  Referring Provider  Jenkins Rouge, MD      Visit Diagnosis: 11/19/15 V fib arrrest; NSTEMI (non-ST elevated myocardial infarction) ([place])  12/04/15 Status post coronary artery stent placement  Patient's Home Medications on Admission:  Current Outpatient Prescriptions:  .  aspirin 81 MG tablet, Take 81 mg by mouth daily., Disp: , Rfl:  .  butalbital-acetaminophen-caffeine (FIORICET) 50-325-40 MG tablet, Take 1-2 tablets by mouth every 6 (six) hours as needed for headache., Disp: 20 tablet, Rfl: 0 .  clopidogrel (PLAVIX) 75 MG tablet, Take 1 tablet (75 mg total) by mouth daily., Disp: 90 tablet, Rfl: 3 .  cyclobenzaprine (FLEXERIL) 5 MG tablet, Take 5 mg by mouth 3 (three) times daily. , Disp: , Rfl: 1 .  escitalopram (LEXAPRO) 20 MG tablet, Take 20 mg by mouth every morning. , Disp: , Rfl:  .  fenofibrate 160 MG tablet, Take 160 mg by mouth daily. Takes at bedtime, Disp: , Rfl:  .  levothyroxine (SYNTHROID, LEVOTHROID) 150 MCG tablet, Take 150 mcg by mouth daily before breakfast. , Disp: , Rfl:  .  metoprolol tartrate (LOPRESSOR) 25 MG tablet, Take 0.5 tablets (12.5 mg total) by mouth 2 (two) times daily., Disp: 180 tablet, Rfl: 3 .  morphine (MSIR) 30 MG tablet, Take 30 mg by mouth every 12 (twelve) hours., Disp: , Rfl:  .  nitroGLYCERIN (NITROSTAT) 0.4 MG SL tablet, Place 1 tablet (0.4 mg total) under the tongue every 5 (five) minutes as needed for chest pain., Disp: 25 tablet, Rfl: 3 .  omega-3 acid ethyl esters (LOVAZA) 1 G capsule, Take 2 g by  mouth 2 (two) times daily., Disp: , Rfl:  .  OVER THE COUNTER MEDICATION, Take 2 capsules by mouth daily. "Relizen" herbal supplement for hormone replacement , Disp: , Rfl:  .  pantoprazole (PROTONIX) 40 MG tablet, Take 40 mg by mouth daily., Disp: , Rfl:  .  rOPINIRole (REQUIP) 0.5 MG tablet, Take 0.5 mg by mouth every evening. , Disp: , Rfl:  .  rosuvastatin (CRESTOR) 10 MG tablet, Take 1 tablet (10 mg total) by mouth daily., Disp: 90 tablet, Rfl: 3  Past Medical History: Past Medical History:  Diagnosis Date  . Acute pulmonary embolism ([place]) 06/20/2015  . Arthritis    osteoarthritis-knees. Chronic back pain  . Basal cell carcinoma of right shoulder    "burned off"  . Chronic SI joint pain   . DVT (deep venous thrombosis) ([place]) 04/2013   left lower leg, resulted in Pulmonary emboli on hormone therapy  . Dyspnea on exertion   . Hypercholesterolemia   . Hypothyroidism   . Migraine    "< once/month" (12/04/2015)  . MVP (mitral valve prolapse)    asymptomatic  . Myocardial infarction ([place]) 11/19/2015   cardiac arrest  . PE (pulmonary embolism) Apr 18, 2013   tx. -using Xarelto now, no further lung problems, denies SOB on 01-02-14  . PONV (postoperative nausea and vomiting)   . RLS (restless legs syndrome)     Tobacco Use: History  Smoking Status  . Never Smoker  Smokeless Tobacco  . Never Used    Labs: Recent Review Flowsheet Data    Labs for ITP Cardiac and Pulmonary Rehab Latest Ref Rng & Units 11/20/2015 11/20/2015 11/20/2015 11/21/2015 11/22/2015   Cholestrol 0 - 200 mg/dL - - - - -   LDLCALC 0 - 99 mg/dL - - - - -   HDL >40 mg/dL - - - - -   Trlycerides <150 mg/dL - 82 - 186(H) -   Hemoglobin A1c 4.8 - 5.6 % - - - - -   PHART 7.350 - 7.450 7.256(L) - 7.157(LL) 7.429 7.276(L)   PCO2ART 35.0 - 45.0 mmHg 46.0(H) - 55.1(H) 27.4(L) 50.4(H)   HCO3 20.0 - 24.0 mEq/L 20.2 - 19.5(L) 17.8(L) 22.7   TCO2 0 - 100 mmol/L 22 - 21 18.7 24.3   ACIDBASEDEF 0.0 - 2.0 mmol/L 6.0(H) -  9.0(H) 5.7(H) 3.1(H)   O2SAT % 99.0 - 97.0 98.8 98.2      Capillary Blood Glucose: Lab Results  Component Value Date   GLUCAP 89 11/25/2015   GLUCAP 116 (H) 11/24/2015   GLUCAP 112 (H) 11/24/2015   GLUCAP 95 11/24/2015   GLUCAP 107 (H) 11/23/2015     Exercise Target Goals:    Exercise Program Goal: Individual exercise prescription set with THRR, safety & activity barriers. Participant demonstrates ability to understand and report RPE using BORG scale, to self-measure pulse accurately, and to acknowledge the importance of the exercise prescription.  Exercise Prescription Goal: Starting with aerobic activity 30 plus minutes a day, 3 days per week for initial exercise prescription. Provide home exercise prescription and guidelines that participant acknowledges understanding prior to discharge.  Activity Barriers & Risk Stratification:     Activity Barriers & Cardiac Risk Stratification - 01/09/16 1004      Activity Barriers & Cardiac Risk Stratification   Activity Barriers Back Problems;Left Knee Replacement   Cardiac Risk Stratification High      6 Minute Walk:     6 Minute Walk    Row Name 01/09/16 1342         6 Minute Walk   Phase Initial     Distance 1603 feet     Walk Time 6 minutes     # of Rest Breaks 0     MPH 3.03     METS 3.83     RPE 12     VO2 Peak 13.41     Symptoms No     Resting HR 67 bpm     Resting BP 104/60     Max Ex. HR 110 bpm     Max Ex. BP 142/78     2 Minute Post BP 100/60        Initial Exercise Prescription:     Initial Exercise Prescription - 01/09/16 1300      Date of Initial Exercise RX and Referring Provider   Date 01/09/16   Referring Provider Jenkins Rouge, MD     Treadmill   MPH 2.6   Grade 1   Minutes 10   METs 3.35     Bike   Level 0.7   Minutes 10   METs 2.65     NuStep   Level 3   Minutes 10   METs 2     Prescription Details   Frequency (times per week) 3   Duration Progress to 30 minutes of  continuous aerobic without signs/symptoms of physical distress     Intensity   THRR 40-80% of Max Heartrate  [id number]   Ratings of Perceived Exertion 11-13   Perceived Dyspnea 0-4     Progression   Progression Continue to progress workloads to maintain intensity without signs/symptoms of physical distress.     Resistance Training   Training Prescription Yes   Weight 2lbs   Reps 10-12      Perform Capillary Blood Glucose checks as needed.  Exercise Prescription Changes:      Exercise Prescription Changes    Row Name 01/24/16 0700             Exercise Review   Progression Yes         Response to Exercise   Blood Pressure (Admit) 100/60       Blood Pressure (Exercise) 120/60       Blood Pressure (Exit) 104/64       Heart Rate (Admit) 73 bpm       Heart Rate (Exercise) 117 bpm       Heart Rate (Exit) 94 bpm       Rating of Perceived Exertion (Exercise) 11       Comments Reviewed home exercise guidelines on 01/20/16.       Duration Progress to 30 minutes of continuous aerobic without signs/symptoms of physical distress       Intensity THRR unchanged         Progression   Progression Continue to progress workloads to maintain intensity without signs/symptoms of physical distress.       Average METs 3         Resistance Training   Training Prescription Yes       Weight 2lbs       Reps 10-12         Interval Training   Interval Training No         Treadmill   MPH 2.6       Grade 1       Minutes 10       METs 3.35         Bike   Level -       Minutes -       METs -         NuStep   Level 3       Minutes 10       METs 3         Arm Ergometer   Level 2       Minutes 10       METs 2.1         Home Exercise Plan   Plans to continue exercise at Home       Frequency Add 4 additional days to program exercise sessions.          Exercise Comments:      Exercise Comments    Row Name 01/20/16 1726           Exercise Comments Doing well with  exercise. Reviewed home exercise guidelines.          Discharge Exercise Prescription (Final Exercise Prescription Changes):     Exercise Prescription Changes - 01/24/16 0700      Exercise Review   Progression Yes     Response to Exercise   Blood Pressure (Admit) 100/60   Blood Pressure (Exercise) 120/60   Blood Pressure (Exit) 104/64   Heart Rate (Admit) 73 bpm   Heart Rate (Exercise) 117 bpm   Heart Rate (Exit) 94 bpm   Rating of Perceived Exertion (Exercise) 11  Comments Reviewed home exercise guidelines on 01/20/16.   Duration Progress to 30 minutes of continuous aerobic without signs/symptoms of physical distress   Intensity THRR unchanged     Progression   Progression Continue to progress workloads to maintain intensity without signs/symptoms of physical distress.   Average METs 3     Resistance Training   Training Prescription Yes   Weight 2lbs   Reps 10-12     Interval Training   Interval Training No     Treadmill   MPH 2.6   Grade 1   Minutes 10   METs 3.35     Bike   Level --   Minutes --   METs --     NuStep   Level 3   Minutes 10   METs 3     Arm Ergometer   Level 2   Minutes 10   METs 2.1     Home Exercise Plan   Plans to continue exercise at Home   Frequency Add 4 additional days to program exercise sessions.      Nutrition:  Target Goals: Understanding of nutrition guidelines, daily intake of sodium 1500mg , cholesterol 200mg , calories 30% from fat and 7% or less from saturated fats, daily to have 5 or more servings of fruits and vegetables.  Biometrics:     Pre Biometrics - 01/13/16 1645      Pre Biometrics   Height 5' 3.5" (1.613 m)   Weight 174 lb 9.7 oz (79.2 kg)   Waist Circumference 41 inches   Hip Circumference 45 inches   Waist to Hip Ratio 0.91 %   BMI (Calculated) 30.5   Triceps Skinfold 28 mm   % Body Fat 42.3 %   Grip Strength 33 kg   Flexibility 13 in   Single Leg Stand 30 seconds       Nutrition  Therapy Plan and Nutrition Goals:     Nutrition Therapy & Goals - 01/10/16 1403      Nutrition Therapy   Diet Therapeutic Lifestyle Changes     Personal Nutrition Goals   Personal Goal #1 1-2 lb wt loss/week to a wt loss goal of 6-24 lb at graduation from [facility], educate and counsel regarding individualized specific dietary modifications aiming towards targeted core components such as weight, hypertension, lipid management, diabetes, heart failure and other comorbidities.   Expected Outcomes Short Term Goal: Understand basic principles of dietary content, such as calories, fat, sodium, cholesterol and nutrients.;Long Term Goal: Adherence to prescribed nutrition plan.      Nutrition Discharge: Nutrition Scores:     Nutrition Assessments - 01/10/16 1402      MEDFICTS Scores   Pre Score 33  will verify score with pt      Nutrition Goals Re-Evaluation:   Psychosocial: Target Goals: Acknowledge presence or absence of depression, maximize coping skills, provide positive support system. Participant is able to verbalize types and ability to use techniques and skills needed for reducing stress and depression.  Initial Review & Psychosocial Screening:     Initial Psych Review & Screening - 01/13/16 1627      Family Dynamics   Good Support System? Yes     Barriers   Psychosocial barriers to participate in program There are no identifiable barriers or psychosocial needs.     Screening Interventions   Interventions Encouraged to exercise      Quality of Life Scores:  Quality of Life - 01/09/16 1419      Quality of Life Scores   Health/Function Pre 23.67 %   Socioeconomic Pre 27.57 %   Psych/Spiritual Pre 23.36 %   Family Pre 28 %   GLOBAL Pre 24.86 %      PHQ-9: Recent Review Flowsheet Data    There is no flowsheet data to display.      Psychosocial Evaluation and Intervention:   Psychosocial  Re-Evaluation:     Psychosocial Re-Evaluation    [place] Name 01/22/16 1704             Psychosocial Re-Evaluation   Interventions Encouraged to attend Cardiac Rehabilitation for the exercise       Continued Psychosocial Services Needed No          Vocational Rehabilitation: Provide vocational rehab assistance to qualifying candidates.   Vocational Rehab Evaluation & Intervention:     Vocational Rehab - 01/09/16 1546      Initial Vocational Rehab Evaluation & Intervention   Assessment shows need for Vocational Rehabilitation No  Pt feels she will be able to return back to work without any difficulty.      Education: Education Goals: Education classes will be provided on a weekly basis, covering required topics. Participant will state understanding/return demonstration of topics presented.  Learning Barriers/Preferences:     Learning Barriers/Preferences - 01/09/16 1445      Learning Barriers/Preferences   Learning Barriers Sight   Learning Preferences Skilled Demonstration;Pictoral;Video      Education Topics: Count Your Pulse:  -Group instruction provided by verbal instruction, demonstration, patient participation and written materials to support subject.  Instructors address importance of being able to find your pulse and how to count your pulse when at home without a heart monitor.  Patients get hands on experience counting their pulse with staff help and individually.   Heart Attack, Angina, and Risk Factor Modification:  -Group instruction provided by verbal instruction, video, and written materials to support subject.  Instructors address signs and symptoms of angina and heart attacks.    Also discuss risk factors for heart disease and how to make changes to improve heart health risk factors.   Functional Fitness:  -Group instruction provided by verbal instruction, demonstration, patient participation, and written materials to support subject.  Instructors  address safety measures for doing things around the house.  Discuss how to get up and down off the floor, how to pick things up properly, how to safely get out of a chair without assistance, and balance training.   Meditation and Mindfulness:  -Group instruction provided by verbal instruction, patient participation, and written materials to support subject.  Instructor addresses importance of mindfulness and meditation practice to help reduce stress and improve awareness.  Instructor also leads participants through a meditation exercise.    Stretching for Flexibility and Mobility:  -Group instruction provided by verbal instruction, patient participation, and written materials to support subject.  Instructors lead participants through series of stretches that are designed to increase flexibility thus improving mobility.  These stretches are additional exercise for major muscle groups that are typically performed during regular warm up and cool down.   Hands Only CPR Anytime:  -Group instruction provided by verbal instruction, video, patient participation and written materials to support subject.  Instructors co-teach with AHA video for hands only CPR.  Participants get hands on experience with mannequins.   Nutrition I class: Heart Healthy Eating:  -Group instruction provided by PowerPoint slides, verbal discussion, and  written materials to support subject matter. The instructor gives an explanation and review of the Therapeutic Lifestyle Changes diet recommendations, which includes a discussion on lipid goals, dietary fat, sodium, fiber, plant stanol/sterol esters, sugar, and the components of a well-balanced, healthy diet.   Nutrition II class: Lifestyle Skills:  -Group instruction provided by PowerPoint slides, verbal discussion, and written materials to support subject matter. The instructor gives an explanation and review of label reading, grocery shopping for heart health, heart healthy  recipe modifications, and ways to make healthier choices when eating out.   Diabetes Question & Answer:  -Group instruction provided by PowerPoint slides, verbal discussion, and written materials to support subject matter. The instructor gives an explanation and review of diabetes co-morbidities, pre- and post-prandial blood glucose goals, pre-exercise blood glucose goals, signs, symptoms, and treatment of hypoglycemia and hyperglycemia, and foot care basics.   Diabetes Blitz:  -Group instruction provided by PowerPoint slides, verbal discussion, and written materials to support subject matter. The instructor gives an explanation and review of the physiology behind type 1 and type 2 diabetes, diabetes medications and rational behind using different medications, pre- and post-prandial blood glucose recommendations and Hemoglobin A1c goals, diabetes diet, and exercise including blood glucose guidelines for exercising safely.    Portion Distortion:  -Group instruction provided by PowerPoint slides, verbal discussion, written materials, and food models to support subject matter. The instructor gives an explanation of serving size versus portion size, changes in portions sizes over the last 20 years, and what consists of a serving from each food group. Flowsheet Row CARDIAC REHAB PHASE II EXERCISE from 01/15/2016 in [facility]  Date  01/15/16  Educator  RD  Instruction Review Code  2- meets goals/outcomes      Stress Management:  -Group instruction provided by verbal instruction, video, and written materials to support subject matter.  Instructors review role of stress in heart disease and how to cope with stress positively.     Exercising on Your Own:  -Group instruction provided by verbal instruction, power point, and written materials to support subject.  Instructors discuss benefits of exercise, components of exercise, frequency and intensity of exercise, and end  points for exercise.  Also discuss use of nitroglycerin and activating EMS.  Review options of places to exercise outside of rehab.  Review guidelines for sex with heart disease.   Cardiac Drugs I:  -Group instruction provided by verbal instruction and written materials to support subject.  Instructor reviews cardiac drug classes: antiplatelets, anticoagulants, beta blockers, and statins.  Instructor discusses reasons, side effects, and lifestyle considerations for each drug class.   Cardiac Drugs II:  -Group instruction provided by verbal instruction and written materials to support subject.  Instructor reviews cardiac drug classes: angiotensin converting enzyme inhibitors (ACE-I), angiotensin II receptor blockers (ARBs), nitrates, and calcium channel blockers.  Instructor discusses reasons, side effects, and lifestyle considerations for each drug class.   Anatomy and Physiology of the Circulatory System:  -Group instruction provided by verbal instruction, video, and written materials to support subject.  Reviews functional anatomy of heart, how it relates to various diagnoses, and what role the heart plays in the overall system.   Knowledge Questionnaire Score:     Knowledge Questionnaire Score - 01/09/16 1443      Knowledge Questionnaire Score   Pre Score 23/24      Core Components/Risk Factors/Patient Goals at Admission:     Personal Goals and Risk Factors at Admission - 01/09/16  1004      Core Components/Risk Factors/Patient Goals on Admission    Weight Management Yes;Weight Loss   Intervention Weight Management: Develop a combined nutrition and exercise program designed to reach desired caloric intake, while maintaining appropriate intake of nutrient and fiber, sodium and fats, and appropriate energy expenditure required for the weight goal.;Weight Management: Provide education and appropriate resources to help participant work on and attain dietary goals.;Weight  Management/Obesity: Establish reasonable short term and long term weight goals.   Expected Outcomes Short Term: Continue to assess and modify interventions until short term weight is achieved;Weight Maintenance: Understanding of the daily nutrition guidelines, which includes 25-35% calories from fat, 7% or less cal from saturated fats, less than 200mg  cholesterol, less than 1.5gm of sodium, & 5 or more servings of fruits and vegetables daily;Long Term: Adherence to nutrition and physical activity/exercise program aimed toward attainment of established weight goal;Weight Loss: Understanding of general recommendations for a balanced deficit meal plan, which promotes 1-2 lb weight loss per week and includes a negative energy balance of [phone] kcal/d;Understanding recommendations for meals to include 15-35% energy as protein, 25-35% energy from fat, 35-60% energy from carbohydrates, less than 200mg  of dietary cholesterol, 20-35 gm of total fiber daily;Understanding of distribution of calorie intake throughout the day with the consumption of 4-5 meals/snacks   Increase Strength and Stamina Yes   Intervention Provide advice, education, support and counseling about physical activity/exercise needs.;Develop an individualized exercise prescription for aerobic and resistive training based on initial evaluation findings, risk stratification, comorbidities and participant's personal goals.   Expected Outcomes Achievement of increased cardiorespiratory fitness and enhanced flexibility, muscular endurance and strength shown through measurements of functional capacity and personal statement of participant.   Improve shortness of breath with ADL's Yes   Intervention Provide education, individualized exercise plan and daily activity instruction to help decrease symptoms of SOB with activities of daily living.   Expected Outcomes Short Term: Achieves a reduction of symptoms when performing activities of daily living.    Hypertension Yes   Intervention Provide education on lifestyle modifcations including regular physical activity/exercise, weight management, moderate sodium restriction and increased consumption of fresh fruit, vegetables, and low fat dairy, alcohol moderation, and smoking cessation.;Monitor prescription use compliance.   Expected Outcomes Short Term: Continued assessment and intervention until BP is < 140/23mm HG in hypertensive participants. < 130/16mm HG in hypertensive participants with diabetes, heart failure or chronic kidney disease.;Long Term: Maintenance of blood pressure at goal levels.   Lipids Yes   Intervention Provide education and support for participant on nutrition & aerobic/resistive exercise along with prescribed medications to achieve LDL 70mg , HDL >40mg .   Expected Outcomes Long Term: Cholesterol controlled with medications as prescribed, with individualized exercise RX and with personalized nutrition plan. Value goals: LDL < 70mg , HDL > 40 mg.;Short Term: Participant states understanding of desired cholesterol values and is compliant with medications prescribed. Participant is following exercise prescription and nutrition guidelines.   Stress Yes   Intervention Offer individual and/or small group education and counseling on adjustment to heart disease, stress management and health-related lifestyle change. Teach and support self-help strategies.;Refer participants experiencing significant psychosocial distress to appropriate mental health specialists for further evaluation and treatment. When possible, include family members and significant others in education/counseling sessions.   Expected Outcomes Short Term: Participant demonstrates changes in health-related behavior, relaxation and other stress management skills, ability to obtain effective social support, and compliance with psychotropic medications if prescribed.;Long Term: Emotional wellbeing is indicated by absence  of  clinically significant psychosocial distress or social isolation.   Personal Goal Other Yes   Personal Goal short: increase stamina, be able to walk for 30 minutes without rest breaks or fatigue  long: get back to [place], improve confidence and know that heart is strong enough for activities/exercise   Intervention Provide education on cardiac disease, and provide exercise programming to improve cardiovascular fitness and confidence   Expected Outcomes Pt will be able to return to [facility] and be able to walk for 30 minutes without rest break or extreme fatigue.      Core Components/Risk Factors/Patient Goals Review:      Goals and Risk Factor Review    Row Name 01/20/16 1727             Core Components/Risk Factors/Patient Goals Review   Personal Goals Review Other       Review Patient is walking 20-30 minutes without rest breaks.       Expected Outcomes Maintain walking program to increase cardiorespiratory fitness and being able to walk 30 minutes without rest breaks.          Core Components/Risk Factors/Patient Goals at Discharge (Final Review):      Goals and Risk Factor Review - 01/20/16 1727      Core Components/Risk Factors/Patient Goals Review   Personal Goals Review Other   Review Patient is walking 20-30 minutes without rest breaks.   Expected Outcomes Maintain walking program to increase cardiorespiratory fitness and being able to walk 30 minutes without rest breaks.      ITP Comments:     ITP Comments    Row Name 01/09/16 1001           ITP Comments Dr. Fransico Him, Medical Director          Comments: Pt is making expected progress toward personal goals after completing 5 sessions.Zen is doing good with weight loss. Recommend continued exercise and life style modification education including  stress management and relaxation techniques to decrease cardiac risk profile. Will monitor blood pressures closely  .Barnet Pall, RN,BSN 01/24/2016 9:02  AM

## 2016-01-22 ENCOUNTER — Encounter (HOSPITAL_COMMUNITY)
Admission: RE | Admit: 2016-01-22 | Discharge: 2016-01-22 | Disposition: A | Discharge status: Home / Self Care | Payer: 59 | Source: Ambulatory Visit | Attending: Cardiovascular Disease | Admitting: Cardiovascular Disease

## 2016-01-22 DIAGNOSIS — I214 Non-ST elevation (NSTEMI) myocardial infarction: Secondary | ICD-10-CM

## 2016-01-22 DIAGNOSIS — Z955 Presence of coronary angioplasty implant and graft: Secondary | ICD-10-CM

## 2016-01-22 NOTE — Progress Notes (Signed)
3 beats of nonsustained SVT this afternoon at cardiac rehab. Patient asymptomatic. ECG tracings shown to Danna Hefty NP. Exit blood pressure 94/62. Patient was given Gatorade during exercise. Will continue to monitor the patient throughout  the program.Brittany Mason Venetia Maxon, RN,BSN 01/22/2016 5:41 PM

## 2016-01-23 ENCOUNTER — Encounter: Payer: Self-pay | Admitting: Cardiovascular Disease

## 2016-01-23 ENCOUNTER — Encounter: Payer: Self-pay | Admitting: Nurse Practitioner

## 2016-01-24 ENCOUNTER — Encounter (HOSPITAL_COMMUNITY)
Admission: RE | Admit: 2016-01-24 | Discharge: 2016-01-24 | Disposition: A | Discharge status: Home / Self Care | Payer: 59 | Source: Ambulatory Visit | Attending: Cardiovascular Disease | Admitting: Cardiovascular Disease

## 2016-01-24 DIAGNOSIS — I214 Non-ST elevation (NSTEMI) myocardial infarction: Secondary | ICD-10-CM

## 2016-01-24 DIAGNOSIS — Z955 Presence of coronary angioplasty implant and graft: Secondary | ICD-10-CM

## 2016-01-27 ENCOUNTER — Encounter (HOSPITAL_COMMUNITY)
Admission: RE | Admit: 2016-01-27 | Discharge: 2016-01-27 | Disposition: A | Discharge status: Home / Self Care | Payer: 59 | Source: Ambulatory Visit | Attending: Cardiovascular Disease | Admitting: Cardiovascular Disease

## 2016-01-27 DIAGNOSIS — Z955 Presence of coronary angioplasty implant and graft: Secondary | ICD-10-CM

## 2016-01-27 DIAGNOSIS — I214 Non-ST elevation (NSTEMI) myocardial infarction: Secondary | ICD-10-CM | POA: Diagnosis not present

## 2016-01-28 MED FILL — ESCITALOPRAM 20 MG TABLET: 20 | 90 days supply | Qty: 90 | Fill #0

## 2016-01-29 ENCOUNTER — Encounter (HOSPITAL_COMMUNITY)
Admission: RE | Admit: 2016-01-29 | Discharge: 2016-01-29 | Disposition: A | Discharge status: Home / Self Care | Payer: 59 | Source: Ambulatory Visit | Attending: Cardiovascular Disease | Admitting: Cardiovascular Disease

## 2016-01-29 DIAGNOSIS — Z955 Presence of coronary angioplasty implant and graft: Secondary | ICD-10-CM

## 2016-01-29 DIAGNOSIS — I214 Non-ST elevation (NSTEMI) myocardial infarction: Secondary | ICD-10-CM | POA: Diagnosis not present

## 2016-01-31 ENCOUNTER — Encounter (HOSPITAL_COMMUNITY)
Admission: RE | Admit: 2016-01-31 | Discharge: 2016-01-31 | Disposition: A | Discharge status: Home / Self Care | Payer: 59 | Source: Ambulatory Visit | Attending: Cardiovascular Disease | Admitting: Cardiovascular Disease

## 2016-01-31 DIAGNOSIS — I214 Non-ST elevation (NSTEMI) myocardial infarction: Secondary | ICD-10-CM | POA: Diagnosis not present

## 2016-02-03 ENCOUNTER — Encounter (HOSPITAL_COMMUNITY)
Admission: RE | Admit: 2016-02-03 | Discharge: 2016-02-03 | Disposition: A | Discharge status: Home / Self Care | Payer: 59 | Source: Ambulatory Visit | Attending: Cardiovascular Disease | Admitting: Cardiovascular Disease

## 2016-02-03 DIAGNOSIS — I214 Non-ST elevation (NSTEMI) myocardial infarction: Secondary | ICD-10-CM

## 2016-02-03 DIAGNOSIS — Z955 Presence of coronary angioplasty implant and graft: Secondary | ICD-10-CM

## 2016-02-05 ENCOUNTER — Encounter (HOSPITAL_COMMUNITY)
Admission: RE | Admit: 2016-02-05 | Discharge: 2016-02-05 | Disposition: A | Discharge status: Home / Self Care | Payer: 59 | Source: Ambulatory Visit | Attending: Cardiovascular Disease | Admitting: Cardiovascular Disease

## 2016-02-05 DIAGNOSIS — I214 Non-ST elevation (NSTEMI) myocardial infarction: Secondary | ICD-10-CM | POA: Diagnosis not present

## 2016-02-05 DIAGNOSIS — Z955 Presence of coronary angioplasty implant and graft: Secondary | ICD-10-CM

## 2016-02-07 ENCOUNTER — Telehealth: Payer: Self-pay | Admitting: Nurse Practitioner

## 2016-02-07 ENCOUNTER — Telehealth: Payer: Self-pay | Admitting: *Deleted

## 2016-02-07 ENCOUNTER — Other Ambulatory Visit: Payer: Self-pay | Admitting: *Deleted

## 2016-02-07 ENCOUNTER — Encounter (HOSPITAL_COMMUNITY)
Admission: RE | Admit: 2016-02-07 | Discharge: 2016-02-07 | Disposition: A | Discharge status: Home / Self Care | Payer: 59 | Source: Ambulatory Visit | Attending: Cardiovascular Disease | Admitting: Cardiovascular Disease

## 2016-02-07 DIAGNOSIS — I214 Non-ST elevation (NSTEMI) myocardial infarction: Secondary | ICD-10-CM | POA: Diagnosis not present

## 2016-02-07 DIAGNOSIS — Z955 Presence of coronary angioplasty implant and graft: Secondary | ICD-10-CM

## 2016-02-07 MED ORDER — ROSUVASTATIN CALCIUM 10 MG PO TABS: 5.0000 mg | ORAL_TABLET | ORAL | 3 refills | Status: DC

## 2016-02-07 MED ORDER — ROSUVASTATIN CALCIUM 10 MG PO TABS: 10.0000 mg | ORAL_TABLET | ORAL | 3 refills | Status: DC

## 2016-02-07 NOTE — Telephone Encounter (Deleted)
S/w pt is agreeable to treatment plan.  Will hold crestor

## 2016-02-07 NOTE — Telephone Encounter (Signed)
New  Message        Pt c/o medication issue:  1. Name of Medication: generic crestor 2. How are you currently taking this medication (dosage and times per day)? 10mg  daily 3. Are you having a reaction (difficulty breathing--STAT)? no  4. What is your medication issue? Having muscle ache while on crestor ----same as lipitor.  Is there something else she can take?  Or, can she go back on zocor?

## 2016-02-07 NOTE — Telephone Encounter (Signed)
Let's hold Crestor for one week - then would she be willing to try Crestor 5 mg but just taking Monday-Wednesday-Friday?

## 2016-02-07 NOTE — Telephone Encounter (Signed)
[facility]end[facility] from [facility] left a msg on the refill vm requesting clarification on the rosuvastatin script. Please call [phone]. Thanks, MI

## 2016-02-07 NOTE — Telephone Encounter (Signed)
Hold crestor (10) mg for one week than crestor (5mg  ) Monday, Wednesday and Friday.  Medication list updated.  Pt agreeable to treatment plan.

## 2016-02-11 NOTE — Telephone Encounter (Signed)
Wasn't able to get through on phone will try later

## 2016-02-11 NOTE — Telephone Encounter (Signed)
lvm on [facility] for clarification of crestor (10 mg ) one half tablet (0.5)mg Monday, Wednesday, and Friday only.

## 2016-02-12 ENCOUNTER — Encounter (HOSPITAL_COMMUNITY)
Admission: RE | Admit: 2016-02-12 | Discharge: 2016-02-12 | Disposition: A | Discharge status: Home / Self Care | Payer: 59 | Source: Ambulatory Visit | Attending: Cardiovascular Disease | Admitting: Cardiovascular Disease

## 2016-02-12 DIAGNOSIS — I214 Non-ST elevation (NSTEMI) myocardial infarction: Secondary | ICD-10-CM

## 2016-02-12 DIAGNOSIS — Z955 Presence of coronary angioplasty implant and graft: Secondary | ICD-10-CM

## 2016-02-14 ENCOUNTER — Encounter (HOSPITAL_COMMUNITY)
Admission: RE | Admit: 2016-02-14 | Discharge: 2016-02-14 | Disposition: A | Discharge status: Home / Self Care | Payer: 59 | Source: Ambulatory Visit | Attending: Cardiovascular Disease | Admitting: Cardiovascular Disease

## 2016-02-14 ENCOUNTER — Encounter: Payer: Self-pay | Admitting: Cardiovascular Disease

## 2016-02-14 ENCOUNTER — Ambulatory Visit: Payer: 59 | Admitting: Cardiology

## 2016-02-14 DIAGNOSIS — Z955 Presence of coronary angioplasty implant and graft: Secondary | ICD-10-CM

## 2016-02-14 DIAGNOSIS — I214 Non-ST elevation (NSTEMI) myocardial infarction: Secondary | ICD-10-CM | POA: Diagnosis not present

## 2016-02-17 ENCOUNTER — Ambulatory Visit: Payer: 59 | Admitting: Cardiovascular Disease

## 2016-02-17 ENCOUNTER — Encounter (HOSPITAL_COMMUNITY)
Admission: RE | Admit: 2016-02-17 | Discharge: 2016-02-17 | Disposition: A | Discharge status: Home / Self Care | Payer: 59 | Source: Ambulatory Visit | Attending: Cardiovascular Disease | Admitting: Cardiovascular Disease

## 2016-02-17 DIAGNOSIS — Z955 Presence of coronary angioplasty implant and graft: Secondary | ICD-10-CM

## 2016-02-17 DIAGNOSIS — I214 Non-ST elevation (NSTEMI) myocardial infarction: Secondary | ICD-10-CM

## 2016-02-18 NOTE — Progress Notes (Signed)
CARDIOLOGY OFFICE NOTE  Date:  03/02/2016    Brittany Mason Date of Birth: 1951-02-14 Medical Record [id number]  PCP:  Gennette Pac, MD  Cardiologist:  Gillian Shields    Chief Complaint  Patient presents with  . Cardiomyopathy  . Muscle Pain    from statin    History of Present Illness: Brittany Mason is a 65 y.o. female who presents today for a follow up visit.  Previous  Patient of Dr Gwenlyn Found  She has a history of pulmonary embolism on prior chronic anticoagulation (in the setting of HRT), mitral valve prolapse, hypothyroidism, hypercholesterolemia, and restless leg syndrome who presented in June 2017 with VF cardiac arrest at home - with 16 minutes of down-time. Cath on 6/14 showing total occlusion of prox circumflex (conservative management favored at that time due to cooling protocol and further neurologic recovery). She was not seen by EP. She had been seen here back in March with atypical chest pain - had a normal stress test back in March of 2017. Strong + FH for CAD.  She was discharged 11/25/2015.  Subsequently had stenting of the totally occluded proximal circumflex by Dr Martinique On 12/04/15 with continued occlusion of the distal vessel fed by collaterals.       Comes in today. Here with her "best friend". Her name is Brittany Mason.  She is doing well. No chest pain. Not short of breath. Not lightheaded or dizzy. Has had some PVCs noted in cardiac rehab. Not symptomatic. Her statin has been changed.           Past Medical History:  Diagnosis Date  . Acute pulmonary embolism ([place]) 06/20/2015  . Arthritis    osteoarthritis-knees. Chronic back pain  . Basal cell carcinoma of right shoulder    "burned off"  . Chronic SI joint pain   . DVT (deep venous thrombosis) ([place]) 04/2013   left lower leg, resulted in Pulmonary emboli on hormone therapy  . Dyspnea on exertion   . Hypercholesterolemia   . Hypothyroidism   . Migraine    "< once/month"  (12/04/2015)  . MVP (mitral valve prolapse)    asymptomatic  . Myocardial infarction ([place]) 11/19/2015   cardiac arrest  . PE (pulmonary embolism) Apr 18, 2013   tx. -using Xarelto now, no further lung problems, denies SOB on 01-02-14  . PONV (postoperative nausea and vomiting)   . RLS (restless legs syndrome)     Past Surgical History:  Procedure Laterality Date  . ANTERIOR CRUCIATE LIGAMENT REPAIR Left 1976; 1981   "open"  . CARDIAC CATHETERIZATION N/A 11/20/2015   Procedure: Left Heart Cath and Coronary Angiography;  Surgeon: Belva Crome, MD;  Location: [facility];  Service: Cardiovascular;  Laterality: N/A;  . CARDIAC CATHETERIZATION N/A 12/04/2015   Procedure: Left Heart Cath and Coronary Angiography;  Surgeon: Sidi Dzikowski M Martinique, MD;  Location: [facility];  Service: Cardiovascular;  Laterality: N/A;  . CARDIAC CATHETERIZATION  12/04/2015   Procedure: Coronary Stent Intervention;  Surgeon: Kirk Sampley M Martinique, MD;  Location: [facility];  Service: Cardiovascular;;  . COLONOSCOPY  09/28/2011   Procedure: COLONOSCOPY;  Surgeon: Juanita Craver, MD;  Location: [facility];  Service: Endoscopy;  Laterality: N/A;  . JOINT REPLACEMENT    . TOTAL ABDOMINAL HYSTERECTOMY  1998  . TOTAL KNEE ARTHROPLASTY Left 01/08/2014   Procedure: LEFT TOTAL KNEE ARTHROPLASTY;  Surgeon: Gearlean Alf, MD;  Location: [facility];  Service: Orthopedics;  Laterality:  Left;     Medications: Current Outpatient Prescriptions  Medication Sig Dispense Refill  . aspirin 81 MG tablet Take 81 mg by mouth daily.    . butalbital-acetaminophen-caffeine (FIORICET) 50-325-40 MG tablet Take 1-2 tablets by mouth every 6 (six) hours as needed for headache. 20 tablet 0  . clopidogrel (PLAVIX) 75 MG tablet Take 1 tablet (75 mg total) by mouth daily. 90 tablet 3  . cyclobenzaprine (FLEXERIL) 5 MG tablet Take 5 mg by mouth 3 (three) times daily as needed for muscle spasms.    Marland Kitchen escitalopram (LEXAPRO) 20 MG tablet Take 20  mg by mouth every morning.     . fenofibrate 160 MG tablet Take 160 mg by mouth daily. Takes at bedtime    . levothyroxine (SYNTHROID, LEVOTHROID) 150 MCG tablet Take 150 mcg by mouth daily before breakfast.     . metoprolol tartrate (LOPRESSOR) 25 MG tablet Take 0.5 tablets (12.5 mg total) by mouth 2 (two) times daily. 180 tablet 3  . morphine (MSIR) 30 MG tablet Take 30 mg by mouth every 12 (twelve) hours.    . nitroGLYCERIN (NITROSTAT) 0.4 MG SL tablet Place 1 tablet (0.4 mg total) under the tongue every 5 (five) minutes as needed for chest pain. 25 tablet 3  . omega-3 acid ethyl esters (LOVAZA) 1 G capsule Take 2 g by mouth 2 (two) times daily.    Marland Kitchen OVER THE COUNTER MEDICATION Take 2 capsules by mouth daily. "Relizen" herbal supplement for hormone replacement     . ranitidine (ZANTAC) 150 MG tablet Take 150 mg by mouth daily.    Marland Kitchen rOPINIRole (REQUIP) 0.5 MG tablet Take 0.5 mg by mouth every evening.     . rosuvastatin (CRESTOR) 10 MG tablet Take 0.5 tablets (5 mg total) by mouth as directed. Take Monday, Wednesday, and Friday only 90 tablet 3   No current facility-administered medications for this visit.     Allergies: Allergies  Allergen Reactions  . Codeine Nausea And Vomiting    Takes hydrocodone; if takes doses too close together, states "violently throws up"  . Erythromycin Hives and Itching    Social History: The patient  reports that she has never smoked. She has never used smokeless tobacco. She reports that she drinks alcohol. She reports that she does not use drugs.   Family History: [name] family history includes Breast cancer in her maternal aunt and paternal aunt; Cancer in her maternal aunt; Congestive Heart Failure in her mother; Coronary artery disease in her father; Hypertension in her father and mother; Stroke in her brother.   Review of Systems: Please see the history of present illness.   Otherwise, the review of systems is positive for none.   All other  systems are reviewed and negative.   Physical Exam: VS:  BP 130/64   Pulse (!) 51   Ht 5\' 3"  (1.6 m)   Wt 77.6 kg (171 lb 1.9 oz)   SpO2 96%   BMI 30.31 kg/m  .  BMI Body mass index is 30.31 kg/m.  Wt Readings from Last 3 Encounters:  03/02/16 77.6 kg (171 lb 1.9 oz)  01/20/16 78.1 kg (172 lb 1.9 oz)  01/13/16 79.2 kg (174 lb 9.7 oz)    General: Pleasant. Well developed, well nourished and in no acute distress.   HEENT: Normal.  Neck: Supple, no JVD, carotid bruits, or masses noted.  Cardiac: Regular rate and rhythm. No murmurs, rubs, or gallops. No edema.  Respiratory:  Lungs are clear  to auscultation bilaterally with normal work of breathing.  GI: Soft and nontender.  MS: No deformity or atrophy. Gait and ROM intact.  Skin: Warm and dry. Color is normal.  Neuro:  Strength and sensation are intact and no gross focal deficits noted.  Psych: Alert, appropriate and with normal affect.   LABORATORY DATA:  EKG:  EKG is not ordered today.  Lab Results  Component Value Date   WBC 4.5 12/05/2015   HGB 11.2 (L) 12/05/2015   HCT 35.5 (L) 12/05/2015   PLT 527 (H) 12/05/2015   GLUCOSE 96 01/20/2016   CHOL 181 11/19/2015   TRIG 186 (H) 11/21/2015   HDL 66 11/19/2015   LDLCALC 94 11/19/2015   ALT 58 (H) 12/02/2015   AST 32 12/02/2015   NA 141 01/20/2016   K 4.3 01/20/2016   CL 104 01/20/2016   CREATININE 0.84 01/20/2016   BUN 17 01/20/2016   CO2 30 01/20/2016   TSH 0.39 (L) 01/20/2016   INR 1.1 12/02/2015   HGBA1C 5.7 (H) 11/19/2015    BNP (last 3 results)  Recent Labs  11/19/15 2335 12/02/15 1058  BNP 80.0 170.6*    ProBNP (last 3 results) No results for input(s): PROBNP in the last 8760 hours.   Other Studies Reviewed Today:  Cardiac Cath/PCI Conclusion from 12/04/15     Prox RCA to Mid RCA lesion, 30% stenosed.  RPDA lesion, 60% stenosed.  Prox Cx to Mid Cx lesion, 100% stenosed.  There is mild left ventricular systolic dysfunction.  Prox Cx  lesion, 99% stenosed. Post intervention, there is a 0% residual stenosis.  1. Single vessel obstructive CAD 2. Mild LV dysfunction. 3. Low LVEDP 4. Successful stenting of the proximal LCx into a large bifurcating OM1. The distal LCx is still occluded with right to left collaterals. This branch is relatively small.   Plan: DAPT for one year. Will stop Lasix. Given improvement in EF would not recommend Life Vest at this point. Anticipate DC in am.      Cardiac Catheterization: 11/20/2015  Prox RCA to Dist RCA lesion, 45% stenosed.  RPDA lesion, 65% stenosed.  Prox Cx to Mid Cx lesion, 100% stenosed.   Total occlusion (Acute vs Chronic) of the proximal circumflex with collaterals from left to left and right to left. The collaterals appear to be meager however, the patient is on the cooling protocol and I suspect that collateralization will significantly improved once the patient is back to body temperature. If angina develops post arrest, recanalization of the circumflex coronary artery would be reasonable.  Diffuse moderate stenosis in a large PDA, up to 70% obstruction. Relatively small distribution LAD that reaches, but does not wrap around, the left ventricular apex. No significant obstruction is noted in the LAD.  Mid inferior wall moderate hypokinesis. Estimated ejection fraction 45-50%. Marked elevation in left ventricular end-diastolic pressure of 40 mmHg secondary to acute ischemia and cooling.   Recommendations:  Conservative medical management for the time being.  I would add Plavix to the patient's medical regimen.  Should the patient have a significant neurological recovery, angina/ongoing evidence of ischemia could be treated with circumflex PCI. We chose against PCI today given cooling, vasoconstriction related to cooling, and clinical stability at the present time without evidence of ongoing ischemia.   Echocardiogram: 11/20/2015 Study Conclusions  -  Left ventricle: The cavity size was normal. There was mild focal  basal hypertrophy of the septum. Systolic function was moderately  reduced. The estimated ejection fraction was  in the range of 35%  to 40%. Diffuse hypokinesis. There is akinesis of the  mid-apicalinferolateral and inferior myocardium. Doppler  parameters are consistent with abnormal left ventricular  relaxation (grade 1 diastolic dysfunction). - Aortic valve: Trileaflet; mildly thickened, mildly calcified  leaflets. There was mild regurgitation directed eccentrically in  the LVOT. - Aorta: Aortic root dimension: 38 mm (ED). - Ascending aorta: The ascending aorta was mildly dilated. - Mitral valve: Calcified annulus. - Right ventricle: The cavity size was moderately dilated. Wall  thickness was normal. Systolic function was moderately reduced.  Assessment/Plan: 1. V-fib Cardiac Arrest/ NSTEMI - with LV dysfunction - Cath on 6/14 showed total occlusion of prox circumflex (conservative management favored at that time due to cooling protocol and to assess for further neurologic recovery). She is now s/p PCI to the LCX note indicated distal circumflex still occluded with collaterals Proximal vessel was stented into a large bifurcating OM1 . Mild LV dysfunction noted at time of repeat cath with EF of 45 to 50%.   2. Ischemic CM - mild LV dysfunction noted on repeat cath. Life Vest is off. Doing very well clinically.repeat MRI to assess EF 3 months post intervention which was done 12/04/15  3. Hypokalemia - potassium 4.5 on recent lab by PCP - rechecking today.   4. HLD - cant tolerate crestor or lipitor has tolerated zocor before will start zocor And she will take CoQ labs in 3 months   5. Elevated LFTs - improved on last recheck.    6. Short term memory issues - greatly improved.   7. PVCs - discussed with Dr. Curt Bears - her arrest was felt to be related to CAD/acute MI - will try to add low dose beta blocker.  [facility] lab. Plan for repeat echo. EF has improved.    Ok to drive labs in 3 months on zocor   Current medicines are reviewed with the patient today.  The patient does not have concerns regarding medicines other than what has been noted above.  The following changes have been made:  See above.  Labs/ tests ordered today include:  Echo in December    No orders of the defined types were placed in this encounter.    Jenkins Rouge

## 2016-02-19 ENCOUNTER — Encounter (HOSPITAL_COMMUNITY)
Admission: RE | Admit: 2016-02-19 | Discharge: 2016-02-19 | Disposition: A | Discharge status: Home / Self Care | Payer: 59 | Source: Ambulatory Visit | Attending: Cardiovascular Disease | Admitting: Cardiovascular Disease

## 2016-02-19 ENCOUNTER — Ambulatory Visit: Payer: 59 | Admitting: Cardiovascular Disease

## 2016-02-19 DIAGNOSIS — I214 Non-ST elevation (NSTEMI) myocardial infarction: Secondary | ICD-10-CM

## 2016-02-19 DIAGNOSIS — Z955 Presence of coronary angioplasty implant and graft: Secondary | ICD-10-CM

## 2016-02-19 NOTE — Progress Notes (Signed)
Cardiac Individual Treatment Plan  Patient Details  Name: Brittany Mason MRN: [id number] Date of Birth: 07/13/1950 Referring Provider:   Flowsheet Row CARDIAC REHAB PHASE II ORIENTATION from 01/09/2016 in [place]  Referring Provider  Jenkins Rouge, MD      Initial Encounter Date:  [place] PHASE II ORIENTATION from 01/09/2016 in [facility]  Date  01/09/16  Referring Provider  Jenkins Rouge, MD      Visit Diagnosis: 11/19/15 V fib arrrest; NSTEMI (non-ST elevated myocardial infarction) ([place])  12/04/15 Status post coronary artery stent placement  Patient's Home Medications on Admission:  Current Outpatient Prescriptions:  .  aspirin 81 MG tablet, Take 81 mg by mouth daily., Disp: , Rfl:  .  butalbital-acetaminophen-caffeine (FIORICET) 50-325-40 MG tablet, Take 1-2 tablets by mouth every 6 (six) hours as needed for headache., Disp: 20 tablet, Rfl: 0 .  clopidogrel (PLAVIX) 75 MG tablet, Take 1 tablet (75 mg total) by mouth daily., Disp: 90 tablet, Rfl: 3 .  cyclobenzaprine (FLEXERIL) 5 MG tablet, Take 5 mg by mouth 3 (three) times daily. , Disp: , Rfl: 1 .  escitalopram (LEXAPRO) 20 MG tablet, Take 20 mg by mouth every morning. , Disp: , Rfl:  .  fenofibrate 160 MG tablet, Take 160 mg by mouth daily. Takes at bedtime, Disp: , Rfl:  .  levothyroxine (SYNTHROID, LEVOTHROID) 150 MCG tablet, Take 150 mcg by mouth daily before breakfast. , Disp: , Rfl:  .  metoprolol tartrate (LOPRESSOR) 25 MG tablet, Take 0.5 tablets (12.5 mg total) by mouth 2 (two) times daily., Disp: 180 tablet, Rfl: 3 .  morphine (MSIR) 30 MG tablet, Take 30 mg by mouth every 12 (twelve) hours., Disp: , Rfl:  .  nitroGLYCERIN (NITROSTAT) 0.4 MG SL tablet, Place 1 tablet (0.4 mg total) under the tongue every 5 (five) minutes as needed for chest pain., Disp: 25 tablet, Rfl: 3 .  omega-3 acid ethyl esters (LOVAZA) 1 G capsule, Take 2 g by  mouth 2 (two) times daily., Disp: , Rfl:  .  OVER THE COUNTER MEDICATION, Take 2 capsules by mouth daily. "Relizen" herbal supplement for hormone replacement , Disp: , Rfl:  .  pantoprazole (PROTONIX) 40 MG tablet, Take 40 mg by mouth daily., Disp: , Rfl:  .  rOPINIRole (REQUIP) 0.5 MG tablet, Take 0.5 mg by mouth every evening. , Disp: , Rfl:  .  rosuvastatin (CRESTOR) 10 MG tablet, Take 0.5 tablets (5 mg total) by mouth as directed. Take Monday, Wednesday, and Friday only, Disp: 90 tablet, Rfl: 3  Past Medical History: Past Medical History:  Diagnosis Date  . Acute pulmonary embolism ([place]) 06/20/2015  . Arthritis    osteoarthritis-knees. Chronic back pain  . Basal cell carcinoma of right shoulder    "burned off"  . Chronic SI joint pain   . DVT (deep venous thrombosis) ([place]) 04/2013   left lower leg, resulted in Pulmonary emboli on hormone therapy  . Dyspnea on exertion   . Hypercholesterolemia   . Hypothyroidism   . Migraine    "< once/month" (12/04/2015)  . MVP (mitral valve prolapse)    asymptomatic  . Myocardial infarction ([place]) 11/19/2015   cardiac arrest  . PE (pulmonary embolism) Apr 18, 2013   tx. -using Xarelto now, no further lung problems, denies SOB on 01-02-14  . PONV (postoperative nausea and vomiting)   . RLS (restless legs syndrome)     Tobacco Use: History  Smoking Status  . Never Smoker  Smokeless Tobacco  . Never Used    Labs: Recent Review Flowsheet Data    Labs for ITP Cardiac and Pulmonary Rehab Latest Ref Rng & Units 11/20/2015 11/20/2015 11/20/2015 11/21/2015 11/22/2015   Cholestrol 0 - 200 mg/dL - - - - -   LDLCALC 0 - 99 mg/dL - - - - -   HDL >40 mg/dL - - - - -   Trlycerides <150 mg/dL - 82 - 186(H) -   Hemoglobin A1c 4.8 - 5.6 % - - - - -   PHART 7.350 - 7.450 7.256(L) - 7.157(LL) 7.429 7.276(L)   PCO2ART 35.0 - 45.0 mmHg 46.0(H) - 55.1(H) 27.4(L) 50.4(H)   HCO3 20.0 - 24.0 mEq/L 20.2 - 19.5(L) 17.8(L) 22.7   TCO2 0 - 100 mmol/L 22 - 21 18.7  24.3   ACIDBASEDEF 0.0 - 2.0 mmol/L 6.0(H) - 9.0(H) 5.7(H) 3.1(H)   O2SAT % 99.0 - 97.0 98.8 98.2      Capillary Blood Glucose: Lab Results  Component Value Date   GLUCAP 89 11/25/2015   GLUCAP 116 (H) 11/24/2015   GLUCAP 112 (H) 11/24/2015   GLUCAP 95 11/24/2015   GLUCAP 107 (H) 11/23/2015     Exercise Target Goals:    Exercise Program Goal: Individual exercise prescription set with THRR, safety & activity barriers. Participant demonstrates ability to understand and report RPE using BORG scale, to self-measure pulse accurately, and to acknowledge the importance of the exercise prescription.  Exercise Prescription Goal: Starting with aerobic activity 30 plus minutes a day, 3 days per week for initial exercise prescription. Provide home exercise prescription and guidelines that participant acknowledges understanding prior to discharge.  Activity Barriers & Risk Stratification:     Activity Barriers & Cardiac Risk Stratification - 01/09/16 1004      Activity Barriers & Cardiac Risk Stratification   Activity Barriers Back Problems;Left Knee Replacement   Cardiac Risk Stratification High      6 Minute Walk:     6 Minute Walk    Row Name 01/09/16 1342         6 Minute Walk   Phase Initial     Distance 1603 feet     Walk Time 6 minutes     # of Rest Breaks 0     MPH 3.03     METS 3.83     RPE 12     VO2 Peak 13.41     Symptoms No     Resting HR 67 bpm     Resting BP 104/60     Max Ex. HR 110 bpm     Max Ex. BP 142/78     2 Minute Post BP 100/60        Initial Exercise Prescription:     Initial Exercise Prescription - 01/09/16 1300      Date of Initial Exercise RX and Referring Provider   Date 01/09/16   Referring Provider Jenkins Rouge, MD     Treadmill   MPH 2.6   Grade 1   Minutes 10   METs 3.35     Bike   Level 0.7   Minutes 10   METs 2.65     NuStep   Level 3   Minutes 10   METs 2     Prescription Details   Frequency (times per  week) 3   Duration Progress to 30 minutes of continuous aerobic without signs/symptoms of physical distress     Intensity  THRR 40-80% of Max Heartrate 63-126   Ratings of Perceived Exertion 11-13   Perceived Dyspnea 0-4     Progression   Progression Continue to progress workloads to maintain intensity without signs/symptoms of physical distress.     Resistance Training   Training Prescription Yes   Weight 2lbs   Reps 10-12      Perform Capillary Blood Glucose checks as needed.  Exercise Prescription Changes:      Exercise Prescription Changes    Row Name 01/24/16 0700 02/17/16 0900           Exercise Review   Progression Yes Yes        Response to Exercise   Blood Pressure (Admit) 100/60 108/58      Blood Pressure (Exercise) 120/60 138/70      Blood Pressure (Exit) 104/64 112/60      Heart Rate (Admit) 73 bpm 68 bpm      Heart Rate (Exercise) 117 bpm 108 bpm      Heart Rate (Exit) 94 bpm 68 bpm      Rating of Perceived Exertion (Exercise) 11 13      Comments Reviewed home exercise guidelines on 01/20/16. Reviewed home exercise guidelines on 01/20/16.      Duration Progress to 30 minutes of continuous aerobic without signs/symptoms of physical distress Progress to 30 minutes of continuous aerobic without signs/symptoms of physical distress      Intensity THRR unchanged THRR unchanged        Progression   Progression Continue to progress workloads to maintain intensity without signs/symptoms of physical distress. Continue to progress workloads to maintain intensity without signs/symptoms of physical distress.      Average METs 3 3.4        Resistance Training   Training Prescription Yes Yes      Weight 2lbs 4lbs      Reps 10-12 10-12        Interval Training   Interval Training No No        Treadmill   MPH 2.6 2.8      Grade 1 2      Minutes 10 10      METs 3.35 3.91        Bike   Level -  -      Minutes -  -      METs -  -        NuStep   Level 3 5       Minutes 10 10      METs 3 3.5        Arm Ergometer   Level 2 2      Minutes 10 10      METs 2.1 2.66        Home Exercise Plan   Plans to continue exercise at [address] 4 additional days to program exercise sessions. Add 4 additional days to program exercise sessions.         Exercise Comments:      Exercise Comments    Row Name 01/20/16 1726 02/14/16 1518         Exercise Comments Doing well with exercise. Reviewed home exercise guidelines. Ary is walking and riding stationary bike 30 minutes, 2 days per week. Pt would like to try elliptical later in the program.         Discharge Exercise Prescription (Final Exercise Prescription Changes):     Exercise Prescription Changes -  02/17/16 0900      Exercise Review   Progression Yes     Response to Exercise   Blood Pressure (Admit) 108/58   Blood Pressure (Exercise) 138/70   Blood Pressure (Exit) 112/60   Heart Rate (Admit) 68 bpm   Heart Rate (Exercise) 108 bpm   Heart Rate (Exit) 68 bpm   Rating of Perceived Exertion (Exercise) 13   Comments Reviewed home exercise guidelines on 01/20/16.   Duration Progress to 30 minutes of continuous aerobic without signs/symptoms of physical distress   Intensity THRR unchanged     Progression   Progression Continue to progress workloads to maintain intensity without signs/symptoms of physical distress.   Average METs 3.4     Resistance Training   Training Prescription Yes   Weight 4lbs   Reps 10-12     Interval Training   Interval Training No     Treadmill   MPH 2.8   Grade 2   Minutes 10   METs 3.91     NuStep   Level 5   Minutes 10   METs 3.5     Arm Ergometer   Level 2   Minutes 10   METs 2.66     Home Exercise Plan   Plans to continue exercise at Home   Frequency Add 4 additional days to program exercise sessions.      Nutrition:  Target Goals: Understanding of nutrition guidelines, daily intake of sodium 1500mg ,  cholesterol 200mg , calories 30% from fat and 7% or less from saturated fats, daily to have 5 or more servings of fruits and vegetables.  Biometrics:     Pre Biometrics - 01/13/16 1645      Pre Biometrics   Height 5' 3.5" (1.613 m)   Weight 174 lb 9.7 oz (79.2 kg)   Waist Circumference 41 inches   Hip Circumference 45 inches   Waist to Hip Ratio 0.91 %   BMI (Calculated) 30.5   Triceps Skinfold 28 mm   % Body Fat 42.3 %   Grip Strength 33 kg   Flexibility 13 in   Single Leg Stand 30 seconds       Nutrition Therapy Plan and Nutrition Goals:     Nutrition Therapy & Goals - 01/10/16 1403      Nutrition Therapy   Diet Therapeutic Lifestyle Changes     Personal Nutrition Goals   Personal Goal #1 1-2 lb wt loss/week to a wt loss goal of 6-24 lb at graduation from [facility], educate and counsel regarding individualized specific dietary modifications aiming towards targeted core components such as weight, hypertension, lipid management, diabetes, heart failure and other comorbidities.   Expected Outcomes Short Term Goal: Understand basic principles of dietary content, such as calories, fat, sodium, cholesterol and nutrients.;Long Term Goal: Adherence to prescribed nutrition plan.      Nutrition Discharge: Nutrition Scores:     Nutrition Assessments - 01/10/16 1402      MEDFICTS Scores   Pre Score 33  will verify score with pt      Nutrition Goals Re-Evaluation:   Psychosocial: Target Goals: Acknowledge presence or absence of depression, maximize coping skills, provide positive support system. Participant is able to verbalize types and ability to use techniques and skills needed for reducing stress and depression.  Initial Review & Psychosocial Screening:     Initial Psych Review & Screening - 01/13/16 1627      Family Dynamics  Good Support System? Yes     Barriers   Psychosocial barriers to participate in  program There are no identifiable barriers or psychosocial needs.     Screening Interventions   Interventions Encouraged to exercise      Quality of Life Scores:     Quality of Life - 01/09/16 1419      Quality of Life Scores   Health/Function Pre 23.67 %   Socioeconomic Pre 27.57 %   Psych/Spiritual Pre 23.36 %   Family Pre 28 %   GLOBAL Pre 24.86 %      PHQ-9: Recent Review Flowsheet Data    There is no flowsheet data to display.      Psychosocial Evaluation and Intervention:   Psychosocial Re-Evaluation:     Psychosocial Re-Evaluation    [place] Name 01/22/16 1704 02/19/16 1808           Psychosocial Re-Evaluation   Interventions Encouraged to attend Cardiac Rehabilitation for the exercise Encouraged to attend Cardiac Rehabilitation for the exercise      Continued Psychosocial Services Needed No No         Vocational Rehabilitation: Provide vocational rehab assistance to qualifying candidates.   Vocational Rehab Evaluation & Intervention:     Vocational Rehab - 01/09/16 1546      Initial Vocational Rehab Evaluation & Intervention   Assessment shows need for Vocational Rehabilitation No  Pt feels she will be able to return back to work without any difficulty.      Education: Education Goals: Education classes will be provided on a weekly basis, covering required topics. Participant will state understanding/return demonstration of topics presented.  Learning Barriers/Preferences:     Learning Barriers/Preferences - 01/09/16 1445      Learning Barriers/Preferences   Learning Barriers Sight   Learning Preferences Skilled Demonstration;Pictoral;Video      Education Topics: Count Your Pulse:  -Group instruction provided by verbal instruction, demonstration, patient participation and written materials to support subject.  Instructors address importance of being able to find your pulse and how to count your pulse when at home without a heart monitor.   Patients get hands on experience counting their pulse with staff help and individually. Flowsheet Row CARDIAC REHAB PHASE II EXERCISE from 02/19/2016 in [facility]  Date  02/07/16  Educator  Barnet Pall, RN  Instruction Review Code  2- meets goals/outcomes      Heart Attack, Angina, and Risk Factor Modification:  -Group instruction provided by verbal instruction, video, and written materials to support subject.  Instructors address signs and symptoms of angina and heart attacks.    Also discuss risk factors for heart disease and how to make changes to improve heart health risk factors. Flowsheet Row CARDIAC REHAB PHASE II EXERCISE from 02/19/2016 in [facility]  Date  01/29/16  Educator  RN  Instruction Review Code  2- meets goals/outcomes      Functional Fitness:  -Group instruction provided by verbal instruction, demonstration, patient participation, and written materials to support subject.  Instructors address safety measures for doing things around the house.  Discuss how to get up and down off the floor, how to pick things up properly, how to safely get out of a chair without assistance, and balance training. Flowsheet Row CARDIAC REHAB PHASE II EXERCISE from 02/19/2016 in [facility]  Date  01/24/16  Educator  EP  Instruction Review Code  2- meets goals/outcomes  Meditation and Mindfulness:  -Group instruction provided by verbal instruction, patient participation, and written materials to support subject.  Instructor addresses importance of mindfulness and meditation practice to help reduce stress and improve awareness.  Instructor also leads participants through a meditation exercise.  Flowsheet Row CARDIAC REHAB PHASE II EXERCISE from 02/19/2016 in [facility]  Date  02/05/16  Educator  Jeanella Craze  Instruction Review Code  2- meets goals/outcomes       Stretching for Flexibility and Mobility:  -Group instruction provided by verbal instruction, patient participation, and written materials to support subject.  Instructors lead participants through series of stretches that are designed to increase flexibility thus improving mobility.  These stretches are additional exercise for major muscle groups that are typically performed during regular warm up and cool down.   Hands Only CPR Anytime:  -Group instruction provided by verbal instruction, video, patient participation and written materials to support subject.  Instructors co-teach with AHA video for hands only CPR.  Participants get hands on experience with mannequins.   Nutrition I class: Heart Healthy Eating:  -Group instruction provided by PowerPoint slides, verbal discussion, and written materials to support subject matter. The instructor gives an explanation and review of the Therapeutic Lifestyle Changes diet recommendations, which includes a discussion on lipid goals, dietary fat, sodium, fiber, plant stanol/sterol esters, sugar, and the components of a well-balanced, healthy diet.   Nutrition II class: Lifestyle Skills:  -Group instruction provided by PowerPoint slides, verbal discussion, and written materials to support subject matter. The instructor gives an explanation and review of label reading, grocery shopping for heart health, heart healthy recipe modifications, and ways to make healthier choices when eating out.   Diabetes Question & Answer:  -Group instruction provided by PowerPoint slides, verbal discussion, and written materials to support subject matter. The instructor gives an explanation and review of diabetes co-morbidities, pre- and post-prandial blood glucose goals, pre-exercise blood glucose goals, signs, symptoms, and treatment of hypoglycemia and hyperglycemia, and foot care basics.   Diabetes Blitz:  -Group instruction provided by PowerPoint slides, verbal  discussion, and written materials to support subject matter. The instructor gives an explanation and review of the physiology behind type 1 and type 2 diabetes, diabetes medications and rational behind using different medications, pre- and post-prandial blood glucose recommendations and Hemoglobin A1c goals, diabetes diet, and exercise including blood glucose guidelines for exercising safely.    Portion Distortion:  -Group instruction provided by PowerPoint slides, verbal discussion, written materials, and food models to support subject matter. The instructor gives an explanation of serving size versus portion size, changes in portions sizes over the last 20 years, and what consists of a serving from each food group. Flowsheet Row CARDIAC REHAB PHASE II EXERCISE from 02/19/2016 in [facility]  Date  01/15/16  Educator  RD  Instruction Review Code  2- meets goals/outcomes      Stress Management:  -Group instruction provided by verbal instruction, video, and written materials to support subject matter.  Instructors review role of stress in heart disease and how to cope with stress positively.     Exercising on Your Own:  -Group instruction provided by verbal instruction, power point, and written materials to support subject.  Instructors discuss benefits of exercise, components of exercise, frequency and intensity of exercise, and end points for exercise.  Also discuss use of nitroglycerin and activating EMS.  Review options of places to exercise outside of rehab.  Review  guidelines for sex with heart disease.   Cardiac Drugs I:  -Group instruction provided by verbal instruction and written materials to support subject.  Instructor reviews cardiac drug classes: antiplatelets, anticoagulants, beta blockers, and statins.  Instructor discusses reasons, side effects, and lifestyle considerations for each drug class. Flowsheet Row CARDIAC REHAB PHASE II EXERCISE from  02/19/2016 in [place] [facility]  Date  01/22/16  Educator  pharmd   Instruction Review Code  2- meets goals/outcomes      Cardiac Drugs II:  -Group instruction provided by verbal instruction and written materials to support subject.  Instructor reviews cardiac drug classes: angiotensin converting enzyme inhibitors (ACE-I), angiotensin II receptor blockers (ARBs), nitrates, and calcium channel blockers.  Instructor discusses reasons, side effects, and lifestyle considerations for each drug class. Flowsheet Row CARDIAC REHAB PHASE II EXERCISE from 02/19/2016 in [facility]  Date  02/19/16  Educator  Pharm D  Instruction Review Code  2- meets goals/outcomes      Anatomy and Physiology of the Circulatory System:  -Group instruction provided by verbal instruction, video, and written materials to support subject.  Reviews functional anatomy of heart, how it relates to various diagnoses, and what role the heart plays in the overall system. Flowsheet Row CARDIAC REHAB PHASE II EXERCISE from 02/19/2016 in [facility] [facility]  Date  02/12/16  Instruction Review Code  2- meets goals/outcomes      Knowledge Questionnaire Score:     Knowledge Questionnaire Score - 01/09/16 1443      Knowledge Questionnaire Score   Pre Score 23/24      Core Components/Risk Factors/Patient Goals at Admission:     Personal Goals and Risk Factors at Admission - 01/09/16 1004      Core Components/Risk Factors/Patient Goals on Admission    Weight Management Yes;Weight Loss   Intervention Weight Management: Develop a combined nutrition and exercise program designed to reach desired caloric intake, while maintaining appropriate intake of nutrient and fiber, sodium and fats, and appropriate energy expenditure required for the weight goal.;Weight Management: Provide education and appropriate resources to help participant work on and attain  dietary goals.;Weight Management/Obesity: Establish reasonable short term and long term weight goals.   Expected Outcomes Short Term: Continue to assess and modify interventions until short term weight is achieved;Weight Maintenance: Understanding of the daily nutrition guidelines, which includes 25-35% calories from fat, 7% or less cal from saturated fats, less than 200mg  cholesterol, less than 1.5gm of sodium, & 5 or more servings of fruits and vegetables daily;Long Term: Adherence to nutrition and physical activity/exercise program aimed toward attainment of established weight goal;Weight Loss: Understanding of general recommendations for a balanced deficit meal plan, which promotes 1-2 lb weight loss per week and includes a negative energy balance of [phone] kcal/d;Understanding recommendations for meals to include 15-35% energy as protein, 25-35% energy from fat, 35-60% energy from carbohydrates, less than 200mg  of dietary cholesterol, 20-35 gm of total fiber daily;Understanding of distribution of calorie intake throughout the day with the consumption of 4-5 meals/snacks   Increase Strength and Stamina Yes   Intervention Provide advice, education, support and counseling about physical activity/exercise needs.;Develop an individualized exercise prescription for aerobic and resistive training based on initial evaluation findings, risk stratification, comorbidities and participant's personal goals.   Expected Outcomes Achievement of increased cardiorespiratory fitness and enhanced flexibility, muscular endurance and strength shown through measurements of functional capacity and personal statement of participant.   Improve shortness  of breath with ADL's Yes   Intervention Provide education, individualized exercise plan and daily activity instruction to help decrease symptoms of SOB with activities of daily living.   Expected Outcomes Short Term: Achieves a reduction of symptoms when performing activities of  daily living.   Hypertension Yes   Intervention Provide education on lifestyle modifcations including regular physical activity/exercise, weight management, moderate sodium restriction and increased consumption of fresh fruit, vegetables, and low fat dairy, alcohol moderation, and smoking cessation.;Monitor prescription use compliance.   Expected Outcomes Short Term: Continued assessment and intervention until BP is < 140/67mm HG in hypertensive participants. < 130/63mm HG in hypertensive participants with diabetes, heart failure or chronic kidney disease.;Long Term: Maintenance of blood pressure at goal levels.   Lipids Yes   Intervention Provide education and support for participant on nutrition & aerobic/resistive exercise along with prescribed medications to achieve LDL 70mg , HDL >40mg .   Expected Outcomes Long Term: Cholesterol controlled with medications as prescribed, with individualized exercise RX and with personalized nutrition plan. Value goals: LDL < 70mg , HDL > 40 mg.;Short Term: Participant states understanding of desired cholesterol values and is compliant with medications prescribed. Participant is following exercise prescription and nutrition guidelines.   Stress Yes   Intervention Offer individual and/or small group education and counseling on adjustment to heart disease, stress management and health-related lifestyle change. Teach and support self-help strategies.;Refer participants experiencing significant psychosocial distress to appropriate mental health specialists for further evaluation and treatment. When possible, include family members and significant others in education/counseling sessions.   Expected Outcomes Short Term: Participant demonstrates changes in health-related behavior, relaxation and other stress management skills, ability to obtain effective social support, and compliance with psychotropic medications if prescribed.;Long Term: Emotional wellbeing is indicated by  absence of clinically significant psychosocial distress or social isolation.   Personal Goal Other Yes   Personal Goal short: increase stamina, be able to walk for 30 minutes without rest breaks or fatigue  long: get back to [place], improve confidence and know that heart is strong enough for activities/exercise   Intervention Provide education on cardiac disease, and provide exercise programming to improve cardiovascular fitness and confidence   Expected Outcomes Pt will be able to return to [facility] and be able to walk for 30 minutes without rest break or extreme fatigue.      Core Components/Risk Factors/Patient Goals Review:      Goals and Risk Factor Review    Row Name 01/20/16 1727 02/14/16 1520           Core Components/Risk Factors/Patient Goals Review   Personal Goals Review Other Other      Review Patient is walking 20-30 minutes without rest breaks. Patient is walking and riding stationary bike 30 minutes without rest breaks.      Expected Outcomes Maintain walking program to increase cardiorespiratory fitness and being able to walk 30 minutes without rest breaks. Maintain walking program to increase cardiorespiratory fitness and being able to walk 30 minutes without rest breaks.         Core Components/Risk Factors/Patient Goals at Discharge (Final Review):      Goals and Risk Factor Review - 02/14/16 1520      Core Components/Risk Factors/Patient Goals Review   Personal Goals Review Other   Review Patient is walking and riding stationary bike 30 minutes without rest breaks.   Expected Outcomes Maintain walking program to increase cardiorespiratory fitness and being able to walk 30 minutes without rest breaks.  ITP Comments:     ITP Comments    Row Name 01/09/16 1001           ITP Comments Dr. Fransico Him, Medical Director          Comments: Pt is making expected progress toward personal goals after completing 16 sessions. Recommend continued  exercise and life style modification education including  stress management and relaxation techniques to decrease cardiac risk profile. Hazely is enjoying participating in phase 2 cardiac rehab.Barnet Pall, RN,BSN 02/21/2016 11:37 AM

## 2016-02-21 ENCOUNTER — Encounter (HOSPITAL_COMMUNITY)
Admission: RE | Admit: 2016-02-21 | Discharge: 2016-02-21 | Disposition: A | Discharge status: Home / Self Care | Payer: 59 | Source: Ambulatory Visit | Attending: Cardiovascular Disease | Admitting: Cardiovascular Disease

## 2016-02-21 DIAGNOSIS — I214 Non-ST elevation (NSTEMI) myocardial infarction: Secondary | ICD-10-CM

## 2016-02-21 DIAGNOSIS — Z955 Presence of coronary angioplasty implant and graft: Secondary | ICD-10-CM

## 2016-02-21 NOTE — Progress Notes (Signed)
Brittany Mason 65 y.o. female Nutrition Note Spoke with pt.  Nutrition Survey reviewed with pt. Pt is following Step 2 of the Therapeutic Lifestyle Changes diet. Pt expressed understanding of the information reviewed. Pt aware of nutrition education classes offered and plans on attending nutrition classes once she's done with rehab.  Wt Readings from Last 3 Encounters:  01/20/16 172 lb 1.9 oz (78.1 kg)  01/13/16 174 lb 9.7 oz (79.2 kg)  01/09/16 174 lb 9.7 oz (79.2 kg)   Nutrition Diagnosis ? Food-and nutrition-related knowledge deficit related to lack of exposure to information as related to diagnosis of: ? CVD ? Obesity related to excessive energy intake as evidenced by a BMI of 30.5 Nutrition Intervention ? Benefits of adopting Therapeutic Lifestyle Changes discussed when [employer] reviewed. ? Pt to attend the Portion Distortion class ? Pt to attend the  ? Nutrition I class                        ? Nutrition II class ? Pt given handouts for: ? Nutrition I class ? Nutrition II class ? Continue client-centered nutrition education by RD, as part of interdisciplinary care.  Goal(s) ? Pt to identify food quantities necessary to achieve weight loss of 6-24 lb (2.7-10.9 kg) at graduation from cardiac rehab.   Monitor and Evaluate progress toward nutrition goal with team.  Derek Mound, M.Ed, RD, LDN, CDE 02/21/2016 4:15 PM

## 2016-02-24 ENCOUNTER — Encounter (HOSPITAL_COMMUNITY)
Admission: RE | Admit: 2016-02-24 | Discharge: 2016-02-24 | Disposition: A | Discharge status: Home / Self Care | Payer: 59 | Source: Ambulatory Visit | Attending: Cardiovascular Disease | Admitting: Cardiovascular Disease

## 2016-02-24 DIAGNOSIS — I214 Non-ST elevation (NSTEMI) myocardial infarction: Secondary | ICD-10-CM

## 2016-02-24 DIAGNOSIS — Z955 Presence of coronary angioplasty implant and graft: Secondary | ICD-10-CM

## 2016-02-26 ENCOUNTER — Encounter (HOSPITAL_COMMUNITY)
Admission: RE | Admit: 2016-02-26 | Discharge: 2016-02-26 | Disposition: A | Discharge status: Home / Self Care | Payer: 59 | Source: Ambulatory Visit | Attending: Cardiovascular Disease | Admitting: Cardiovascular Disease

## 2016-02-26 DIAGNOSIS — I214 Non-ST elevation (NSTEMI) myocardial infarction: Secondary | ICD-10-CM | POA: Diagnosis not present

## 2016-02-26 DIAGNOSIS — Z955 Presence of coronary angioplasty implant and graft: Secondary | ICD-10-CM

## 2016-02-28 ENCOUNTER — Encounter (HOSPITAL_COMMUNITY)
Admission: RE | Admit: 2016-02-28 | Discharge: 2016-02-28 | Disposition: A | Discharge status: Home / Self Care | Payer: 59 | Source: Ambulatory Visit | Attending: Cardiovascular Disease | Admitting: Cardiovascular Disease

## 2016-02-28 DIAGNOSIS — I214 Non-ST elevation (NSTEMI) myocardial infarction: Secondary | ICD-10-CM | POA: Diagnosis not present

## 2016-03-02 ENCOUNTER — Encounter: Payer: Self-pay | Admitting: Cardiovascular Disease

## 2016-03-02 ENCOUNTER — Encounter (HOSPITAL_COMMUNITY)
Admission: RE | Admit: 2016-03-02 | Discharge: 2016-03-02 | Disposition: A | Discharge status: Home / Self Care | Payer: 59 | Source: Ambulatory Visit | Attending: Cardiovascular Disease | Admitting: Cardiovascular Disease

## 2016-03-02 ENCOUNTER — Ambulatory Visit (INDEPENDENT_AMBULATORY_CARE_PROVIDER_SITE_OTHER): Payer: 59 | Admitting: Cardiovascular Disease

## 2016-03-02 VITALS — BP 130/64 | HR 51 | Ht 63.0 in | Wt 171.1 lb

## 2016-03-02 DIAGNOSIS — I214 Non-ST elevation (NSTEMI) myocardial infarction: Secondary | ICD-10-CM | POA: Diagnosis not present

## 2016-03-02 DIAGNOSIS — Z79899 Other long term (current) drug therapy: Secondary | ICD-10-CM | POA: Diagnosis not present

## 2016-03-02 DIAGNOSIS — I255 Ischemic cardiomyopathy: Secondary | ICD-10-CM

## 2016-03-02 DIAGNOSIS — Z955 Presence of coronary angioplasty implant and graft: Secondary | ICD-10-CM

## 2016-03-02 MED ORDER — SIMVASTATIN 20 MG PO TABS: 20.0000 mg | ORAL_TABLET | Freq: Every day | ORAL | 11 refills | Status: DC

## 2016-03-02 MED FILL — SIMVASTATIN 20 MG TABLET: 20 | 90 days supply | Qty: 90 | Fill #0

## 2016-03-02 NOTE — Patient Instructions (Addendum)
Medication Instructions:  Your physician has recommended you make the following change in your medication:  1-STOP Crestor 2-START Zocor 20 mg by mouth daily  Labwork: Your physician recommends that you return for lab work in: 3 months for a fasting lipid and liver panel.  Testing/Procedures: Your physician has requested that you have an echocardiogram in December. Echocardiography is a painless test that uses sound waves to create images of your heart. It provides your doctor with information about the size and shape of your heart and how well your heart's chambers and valves are working. This procedure takes approximately one hour. There are no restrictions for this procedure.  Follow-Up: Your physician wants you to follow-up in: 6 months with Dr. Johnsie Cancel. You will receive a reminder letter in the mail two months in advance. If you don't receive a letter, please call our office to schedule the follow-up appointment.   If you need a refill on your cardiac medications before your next appointment, please call your pharmacy.

## 2016-03-04 ENCOUNTER — Encounter (HOSPITAL_COMMUNITY)
Admission: RE | Admit: 2016-03-04 | Discharge: 2016-03-04 | Disposition: A | Discharge status: Home / Self Care | Payer: 59 | Source: Ambulatory Visit | Attending: Cardiovascular Disease | Admitting: Cardiovascular Disease

## 2016-03-04 DIAGNOSIS — I214 Non-ST elevation (NSTEMI) myocardial infarction: Secondary | ICD-10-CM

## 2016-03-04 DIAGNOSIS — Z955 Presence of coronary angioplasty implant and graft: Secondary | ICD-10-CM

## 2016-03-05 DIAGNOSIS — L910 Hypertrophic scar: Secondary | ICD-10-CM | POA: Diagnosis not present

## 2016-03-05 DIAGNOSIS — Z85828 Personal history of other malignant neoplasm of skin: Secondary | ICD-10-CM | POA: Diagnosis not present

## 2016-03-06 ENCOUNTER — Encounter (HOSPITAL_COMMUNITY)
Admission: RE | Admit: 2016-03-06 | Discharge: 2016-03-06 | Disposition: A | Discharge status: Home / Self Care | Payer: 59 | Source: Ambulatory Visit | Attending: Cardiovascular Disease | Admitting: Cardiovascular Disease

## 2016-03-06 DIAGNOSIS — I214 Non-ST elevation (NSTEMI) myocardial infarction: Secondary | ICD-10-CM | POA: Diagnosis not present

## 2016-03-06 DIAGNOSIS — Z955 Presence of coronary angioplasty implant and graft: Secondary | ICD-10-CM

## 2016-03-09 ENCOUNTER — Encounter (HOSPITAL_COMMUNITY)
Admission: RE | Admit: 2016-03-09 | Discharge: 2016-03-09 | Disposition: A | Discharge status: Home / Self Care | Payer: 59 | Source: Ambulatory Visit | Attending: Cardiovascular Disease | Admitting: Cardiovascular Disease

## 2016-03-09 DIAGNOSIS — I214 Non-ST elevation (NSTEMI) myocardial infarction: Secondary | ICD-10-CM | POA: Diagnosis not present

## 2016-03-09 DIAGNOSIS — Z955 Presence of coronary angioplasty implant and graft: Secondary | ICD-10-CM

## 2016-03-11 ENCOUNTER — Encounter (HOSPITAL_COMMUNITY)
Admission: RE | Admit: 2016-03-11 | Discharge: 2016-03-11 | Disposition: A | Discharge status: Home / Self Care | Payer: 59 | Source: Ambulatory Visit | Attending: Cardiovascular Disease | Admitting: Cardiovascular Disease

## 2016-03-11 DIAGNOSIS — I214 Non-ST elevation (NSTEMI) myocardial infarction: Secondary | ICD-10-CM | POA: Diagnosis not present

## 2016-03-11 DIAGNOSIS — Z955 Presence of coronary angioplasty implant and graft: Secondary | ICD-10-CM

## 2016-03-11 MED FILL — CLOPIDOGREL 75 MG TABLET: 75 | 90 days supply | Qty: 90 | Fill #1

## 2016-03-13 ENCOUNTER — Encounter (HOSPITAL_COMMUNITY)
Admission: RE | Admit: 2016-03-13 | Discharge: 2016-03-13 | Disposition: A | Discharge status: Home / Self Care | Payer: 59 | Source: Ambulatory Visit | Attending: Cardiovascular Disease | Admitting: Cardiovascular Disease

## 2016-03-13 DIAGNOSIS — Z955 Presence of coronary angioplasty implant and graft: Secondary | ICD-10-CM

## 2016-03-13 DIAGNOSIS — I214 Non-ST elevation (NSTEMI) myocardial infarction: Secondary | ICD-10-CM | POA: Diagnosis not present

## 2016-03-13 MED FILL — MORPHINE SULF ER 30 MG TAB: 30 | 30 days supply | Qty: 60 | Fill #0

## 2016-03-16 ENCOUNTER — Encounter (HOSPITAL_COMMUNITY)
Admission: RE | Admit: 2016-03-16 | Discharge: 2016-03-16 | Disposition: A | Discharge status: Home / Self Care | Payer: 59 | Source: Ambulatory Visit | Attending: Cardiovascular Disease | Admitting: Cardiovascular Disease

## 2016-03-16 DIAGNOSIS — Z955 Presence of coronary angioplasty implant and graft: Secondary | ICD-10-CM

## 2016-03-16 DIAGNOSIS — I214 Non-ST elevation (NSTEMI) myocardial infarction: Secondary | ICD-10-CM | POA: Diagnosis not present

## 2016-03-16 MED FILL — rOPINIRole HCL 0.5 MG TABS: 0.5 | 90 days supply | Qty: 90 | Fill #2

## 2016-03-18 ENCOUNTER — Encounter (HOSPITAL_COMMUNITY)
Admission: RE | Admit: 2016-03-18 | Discharge: 2016-03-18 | Disposition: A | Discharge status: Home / Self Care | Payer: 59 | Source: Ambulatory Visit | Attending: Cardiovascular Disease | Admitting: Cardiovascular Disease

## 2016-03-18 DIAGNOSIS — I214 Non-ST elevation (NSTEMI) myocardial infarction: Secondary | ICD-10-CM | POA: Diagnosis not present

## 2016-03-18 DIAGNOSIS — Z955 Presence of coronary angioplasty implant and graft: Secondary | ICD-10-CM

## 2016-03-19 ENCOUNTER — Telehealth (HOSPITAL_COMMUNITY): Payer: Self-pay | Admitting: *Deleted

## 2016-03-19 NOTE — Progress Notes (Signed)
Cardiac Individual Treatment Plan  Patient Details  Name: CYNTHIE GARMON MRN: [id number] Date of Birth: 02-Feb-1951 Referring Provider:   Flowsheet Row CARDIAC REHAB PHASE II ORIENTATION from 01/09/2016 in [place]  Referring Provider  Jenkins Rouge, MD      Initial Encounter Date:  [place] PHASE II ORIENTATION from 01/09/2016 in [place]  Date  01/09/16  Referring Provider  Jenkins Rouge, MD      Visit Diagnosis: 11/19/15 V fib arrrest; NSTEMI (non-ST elevated myocardial infarction) ([place])  12/04/15 Status post coronary artery stent placement  Patient's Home Medications on Admission:  Current Outpatient Prescriptions:  .  aspirin 81 MG tablet, Take 81 mg by mouth daily., Disp: , Rfl:  .  butalbital-acetaminophen-caffeine (FIORICET) 50-325-40 MG tablet, Take 1-2 tablets by mouth every 6 (six) hours as needed for headache., Disp: 20 tablet, Rfl: 0 .  clopidogrel (PLAVIX) 75 MG tablet, Take 1 tablet (75 mg total) by mouth daily., Disp: 90 tablet, Rfl: 3 .  cyclobenzaprine (FLEXERIL) 5 MG tablet, Take 5 mg by mouth 3 (three) times daily as needed for muscle spasms., Disp: , Rfl:  .  escitalopram (LEXAPRO) 20 MG tablet, Take 20 mg by mouth every morning. , Disp: , Rfl:  .  fenofibrate 160 MG tablet, Take 160 mg by mouth daily. Takes at bedtime, Disp: , Rfl:  .  levothyroxine (SYNTHROID, LEVOTHROID) 150 MCG tablet, Take 150 mcg by mouth daily before breakfast. , Disp: , Rfl:  .  metoprolol tartrate (LOPRESSOR) 25 MG tablet, Take 0.5 tablets (12.5 mg total) by mouth 2 (two) times daily., Disp: 180 tablet, Rfl: 3 .  morphine (MSIR) 30 MG tablet, Take 30 mg by mouth every 12 (twelve) hours., Disp: , Rfl:  .  nitroGLYCERIN (NITROSTAT) 0.4 MG SL tablet, Place 1 tablet (0.4 mg total) under the tongue every 5 (five) minutes as needed for chest pain., Disp: 25 tablet, Rfl: 3 .  omega-3 acid ethyl esters (LOVAZA) 1 G  capsule, Take 2 g by mouth 2 (two) times daily., Disp: , Rfl:  .  OVER THE COUNTER MEDICATION, Take 2 capsules by mouth daily. "Relizen" herbal supplement for hormone replacement , Disp: , Rfl:  .  ranitidine (ZANTAC) 150 MG tablet, Take 150 mg by mouth daily., Disp: , Rfl:  .  rOPINIRole (REQUIP) 0.5 MG tablet, Take 0.5 mg by mouth every evening. , Disp: , Rfl:  .  simvastatin (ZOCOR) 20 MG tablet, Take 1 tablet (20 mg total) by mouth at bedtime., Disp: 30 tablet, Rfl: 11  Past Medical History: Past Medical History:  Diagnosis Date  . Acute pulmonary embolism ([place]) 06/20/2015  . Arthritis    osteoarthritis-knees. Chronic back pain  . Basal cell carcinoma of right shoulder    "burned off"  . Chronic SI joint pain   . DVT (deep venous thrombosis) ([place]) 04/2013   left lower leg, resulted in Pulmonary emboli on hormone therapy  . Dyspnea on exertion   . Hypercholesterolemia   . Hypothyroidism   . Migraine    "< once/month" (12/04/2015)  . MVP (mitral valve prolapse)    asymptomatic  . Myocardial infarction 11/19/2015   cardiac arrest  . PE (pulmonary embolism) Apr 18, 2013   tx. -using Xarelto now, no further lung problems, denies SOB on 01-02-14  . PONV (postoperative nausea and vomiting)   . RLS (restless legs syndrome)     Tobacco Use: History  Smoking Status  .  Never Smoker  Smokeless Tobacco  . Never Used    Labs: Recent Review Flowsheet Data    Labs for ITP Cardiac and Pulmonary Rehab Latest Ref Rng & Units 11/20/2015 11/20/2015 11/20/2015 11/21/2015 11/22/2015   Cholestrol 0 - 200 mg/dL - - - - -   LDLCALC 0 - 99 mg/dL - - - - -   HDL >40 mg/dL - - - - -   Trlycerides <150 mg/dL - 82 - 186(H) -   Hemoglobin A1c 4.8 - 5.6 % - - - - -   PHART 7.350 - 7.450 7.256(L) - 7.157(LL) 7.429 7.276(L)   PCO2ART 35.0 - 45.0 mmHg 46.0(H) - 55.1(H) 27.4(L) 50.4(H)   HCO3 20.0 - 24.0 mEq/L 20.2 - 19.5(L) 17.8(L) 22.7   TCO2 0 - 100 mmol/L 22 - 21 18.7 24.3   ACIDBASEDEF 0.0 - 2.0  mmol/L 6.0(H) - 9.0(H) 5.7(H) 3.1(H)   O2SAT % 99.0 - 97.0 98.8 98.2      Capillary Blood Glucose: Lab Results  Component Value Date   GLUCAP 89 11/25/2015   GLUCAP 116 (H) 11/24/2015   GLUCAP 112 (H) 11/24/2015   GLUCAP 95 11/24/2015   GLUCAP 107 (H) 11/23/2015     Exercise Target Goals:    Exercise Program Goal: Individual exercise prescription set with THRR, safety & activity barriers. Participant demonstrates ability to understand and report RPE using BORG scale, to self-measure pulse accurately, and to acknowledge the importance of the exercise prescription.  Exercise Prescription Goal: Starting with aerobic activity 30 plus minutes a day, 3 days per week for initial exercise prescription. Provide home exercise prescription and guidelines that participant acknowledges understanding prior to discharge.  Activity Barriers & Risk Stratification:     Activity Barriers & Cardiac Risk Stratification - 01/09/16 1004      Activity Barriers & Cardiac Risk Stratification   Activity Barriers Back Problems;Left Knee Replacement   Cardiac Risk Stratification High      6 Minute Walk:     6 Minute Walk    Row Name 01/09/16 1342         6 Minute Walk   Phase Initial     Distance 1603 feet     Walk Time 6 minutes     # of Rest Breaks 0     MPH 3.03     METS 3.83     RPE 12     VO2 Peak 13.41     Symptoms No     Resting HR 67 bpm     Resting BP 104/60     Max Ex. HR 110 bpm     Max Ex. BP 142/78     2 Minute Post BP 100/60        Initial Exercise Prescription:     Initial Exercise Prescription - 01/09/16 1300      Date of Initial Exercise RX and Referring Provider   Date 01/09/16   Referring Provider Jenkins Rouge, MD     Treadmill   MPH 2.6   Grade 1   Minutes 10   METs 3.35     Bike   Level 0.7   Minutes 10   METs 2.65     NuStep   Level 3   Minutes 10   METs 2     Prescription Details   Frequency (times per week) 3   Duration Progress to  30 minutes of continuous aerobic without signs/symptoms of physical distress     Intensity   THRR 40-80%  of Max Heartrate 63-126   Ratings of Perceived Exertion 11-13   Perceived Dyspnea 0-4     Progression   Progression Continue to progress workloads to maintain intensity without signs/symptoms of physical distress.     Resistance Training   Training Prescription Yes   Weight 2lbs   Reps 10-12      Perform Capillary Blood Glucose checks as needed.  Exercise Prescription Changes:      Exercise Prescription Changes    Row Name 01/24/16 0700 02/17/16 0900 03/17/16 1200         Exercise Review   Progression Yes Yes Yes       Response to Exercise   Blood Pressure (Admit) 100/60 108/58 112/58     Blood Pressure (Exercise) 120/60 138/70 140/64     Blood Pressure (Exit) 104/64 112/60 109/66     Heart Rate (Admit) 73 bpm 68 bpm 84 bpm     Heart Rate (Exercise) 117 bpm 108 bpm 137 bpm     Heart Rate (Exit) 94 bpm 68 bpm 90 bpm     Rating of Perceived Exertion (Exercise) _0 Comments Reviewed home exercise guidelines on 01/20/16. Reviewed home exercise guidelines on 01/20/16. Reviewed home exercise guidelines on 01/20/16.     Duration Progress to 30 minutes of continuous aerobic without signs/symptoms of physical distress Progress to 30 minutes of continuous aerobic without signs/symptoms of physical distress Progress to 30 minutes of continuous aerobic without signs/symptoms of physical distress     Intensity THRR unchanged THRR unchanged THRR unchanged       Progression   Progression Continue to progress workloads to maintain intensity without signs/symptoms of physical distress. Continue to progress workloads to maintain intensity without signs/symptoms of physical distress. Continue to progress workloads to maintain intensity without signs/symptoms of physical distress.     Average METs 3 3.4 4.9       Resistance Training   Training Prescription Yes Yes Yes     Weight  2lbs 4lbs 5LBS     Reps 10-12 10-12 10-12       Interval Training   Interval Training No No No       Treadmill   MPH 2.6 2.8 2.8     Grade _1 Minutes _2 METs 3.35 3.91 3.91       Bike   Level -  -  -     Minutes -  -  -     METs -  -  -       NuStep   Level _3 Minutes _4 METs 3 3.5 4.3       Arm Ergometer   Level _5 Minutes _6 METs 2.1 2.66 2.66       Adaptive Motion Trainer   Resistance  -  - 1     Grade  -  - 1     Minutes  -  - 10     METs  -  - 6.4       Home Exercise Plan   Plans to continue exercise at [place]     Frequency Add 4 additional days to program exercise sessions. Add 4 additional days to program exercise sessions. Add 4 additional days to program exercise sessions.  Exercise Comments:      Exercise Comments    Row Name 01/20/16 1726 02/14/16 1518 03/17/16 1154       Exercise Comments Doing well with exercise. Reviewed home exercise guidelines. Amaiah is walking and riding stationary bike 30 minutes, 2 days per week. Pt would like to try elliptical later in the program. Continues to make great progress with exercise. Pt has increased duration on the elliptical.         Discharge Exercise Prescription (Final Exercise Prescription Changes):     Exercise Prescription Changes - 03/17/16 1200      Exercise Review   Progression Yes     Response to Exercise   Blood Pressure (Admit) 112/58   Blood Pressure (Exercise) 140/64   Blood Pressure (Exit) 109/66   Heart Rate (Admit) 84 bpm   Heart Rate (Exercise) 137 bpm   Heart Rate (Exit) 90 bpm   Rating of Perceived Exertion (Exercise) 12   Comments Reviewed home exercise guidelines on 01/20/16.   Duration Progress to 30 minutes of continuous aerobic without signs/symptoms of physical distress   Intensity THRR unchanged     Progression   Progression Continue to progress workloads to maintain intensity without signs/symptoms of  physical distress.   Average METs 4.9     Resistance Training   Training Prescription Yes   Weight 5LBS   Reps 10-12     Interval Training   Interval Training No     Treadmill   MPH 2.8   Grade 2   Minutes 10   METs 3.91     NuStep   Level 5   Minutes 10   METs 4.3     Arm Ergometer   Level 2   Minutes 10   METs 2.66     Adaptive Motion Trainer   Resistance 1   Grade 1   Minutes 10   METs 6.4     Home Exercise Plan   Plans to continue exercise at Home   Frequency Add 4 additional days to program exercise sessions.      Nutrition:  Target Goals: Understanding of nutrition guidelines, daily intake of sodium <1540m, cholesterol <207m calories 30% from fat and 7% or less from saturated fats, daily to have 5 or more servings of fruits and vegetables.  Biometrics:     Pre Biometrics - 01/13/16 1645      Pre Biometrics   Height 5' 3.5" (1.613 m)   Weight 174 lb 9.7 oz (79.2 kg)   Waist Circumference 41 inches   Hip Circumference 45 inches   Waist to Hip Ratio 0.91 %   BMI (Calculated) 30.5   Triceps Skinfold 28 mm   % Body Fat 42.3 %   Grip Strength 33 kg   Flexibility 13 in   Single Leg Stand 30 seconds       Nutrition Therapy Plan and Nutrition Goals:     Nutrition Therapy & Goals - 01/10/16 1403      Nutrition Therapy   Diet Therapeutic Lifestyle Changes     Personal Nutrition Goals   Personal Goal #1 1-2 lb wt loss/week to a wt loss goal of 6-24 lb [facility] regarding individualized specific dietary modifications aiming towards targeted core components such as weight, hypertension, lipid management, diabetes, heart failure and other comorbidities.   Expected Outcomes Short Term Goal: Understand basic principles of dietary content, such as  calories, fat, sodium, cholesterol and nutrients.;Long Term Goal: Adherence to prescribed nutrition plan.       Nutrition Discharge: Nutrition Scores:     Nutrition Assessments - 02/21/16 1612      MEDFICTS Scores   Pre Score 33      Nutrition Goals Re-Evaluation:   Psychosocial: Target Goals: Acknowledge presence or absence of depression, maximize coping skills, provide positive support system. Participant is able to verbalize types and ability to use techniques and skills needed for reducing stress and depression.  Initial Review & Psychosocial Screening:     Initial Psych Review & Screening - 01/13/16 1627      Family Dynamics   Good Support System? Yes     Barriers   Psychosocial barriers to participate in program There are no identifiable barriers or psychosocial needs.     Screening Interventions   Interventions Encouraged to exercise      Quality of Life Scores:     Quality of Life - 01/09/16 1419      Quality of Life Scores   Health/Function Pre 23.67 %   Socioeconomic Pre 27.57 %   Psych/Spiritual Pre 23.36 %   Family Pre 28 %   GLOBAL Pre 24.86 %      PHQ-9: Recent Review Flowsheet Data    There is no flowsheet data to display.      Psychosocial Evaluation and Intervention:   Psychosocial Re-Evaluation:     Psychosocial Re-Evaluation    [place] Name 01/22/16 1704 02/19/16 1808 03/19/16 1436         Psychosocial Re-Evaluation   Interventions Encouraged to attend Cardiac Rehabilitation for the exercise Encouraged to attend Cardiac Rehabilitation for the exercise Encouraged to attend Cardiac Rehabilitation for the exercise     Continued Psychosocial Services Needed No No No        Vocational Rehabilitation: Provide vocational rehab assistance to qualifying candidates.   Vocational Rehab Evaluation & Intervention:     Vocational Rehab - 01/09/16 1546      Initial Vocational Rehab Evaluation & Intervention   Assessment shows need for Vocational Rehabilitation No  Pt feels she will be able to return back to work without any difficulty.       Education: Education Goals: Education classes will be provided on a weekly basis, covering required topics. Participant will state understanding/return demonstration of topics presented.  Learning Barriers/Preferences:     Learning Barriers/Preferences - 01/09/16 1445      Learning Barriers/Preferences   Learning Barriers Sight   Learning Preferences Skilled Demonstration;Pictoral;Video      Education Topics: Count Your Pulse:  -Group instruction provided by verbal instruction, demonstration, patient participation and written materials to support subject.  Instructors address importance of being able to find your pulse and how to count your pulse when at home without a heart monitor.  Patients get hands on experience counting their pulse with staff help and individually. Flowsheet Row CARDIAC REHAB PHASE II EXERCISE from 03/18/2016 in [place]  Date  02/07/16  Educator  Barnet Pall, RN  Instruction Review Code  2- meets goals/outcomes      Heart Attack, Angina, and Risk Factor Modification:  -Group instruction provided by verbal instruction, video, and written materials to support subject.  Instructors address signs and symptoms of angina and heart attacks.    Also discuss risk factors for heart disease and how to make changes to improve heart health risk factors. Flowsheet Row CARDIAC REHAB PHASE II EXERCISE from 03/18/2016  in [place]  Date  01/29/16  Educator  RN  Instruction Review Code  2- meets goals/outcomes      Functional Fitness:  -Group instruction provided by verbal instruction, demonstration, patient participation, and written materials to support subject.  Instructors address safety measures for doing things around the house.  Discuss how to get up and down off the floor, how to pick things up properly, how to safely get out of a chair without assistance, and balance training. Flowsheet Row  CARDIAC REHAB PHASE II EXERCISE from 03/18/2016 in [place]  Date  01/24/16  Educator  EP  Instruction Review Code  2- meets goals/outcomes      Meditation and Mindfulness:  -Group instruction provided by verbal instruction, patient participation, and written materials to support subject.  Instructor addresses importance of mindfulness and meditation practice to help reduce stress and improve awareness.  Instructor also leads participants through a meditation exercise.  Flowsheet Row CARDIAC REHAB PHASE II EXERCISE from 03/18/2016 in [place]  Date  02/05/16  Educator  Jeanella Craze  Instruction Review Code  2- meets goals/outcomes      Stretching for Flexibility and Mobility:  -Group instruction provided by verbal instruction, patient participation, and written materials to support subject.  Instructors lead participants through series of stretches that are designed to increase flexibility thus improving mobility.  These stretches are additional exercise for major muscle groups that are typically performed during regular warm up and cool down.   Hands Only CPR Anytime:  -Group instruction provided by verbal instruction, video, patient participation and written materials to support subject.  Instructors co-teach with AHA video for hands only CPR.  Participants get hands on experience with mannequins.   Nutrition I class: Heart Healthy Eating:  -Group instruction provided by PowerPoint slides, verbal discussion, and written materials to support subject matter. The instructor gives an explanation and review of the Therapeutic Lifestyle Changes diet recommendations, which includes a discussion on lipid goals, dietary fat, sodium, fiber, plant stanol/sterol esters, sugar, and the components of a well-balanced, healthy diet. Flowsheet Row CARDIAC REHAB PHASE II EXERCISE from 03/18/2016 in [place]  Date  02/21/16  Educator  RD  Instruction Review Code  Not applicable [class handouts given]      Nutrition II class: Lifestyle Skills:  -Group instruction provided by PowerPoint slides, verbal discussion, and written materials to support subject matter. The instructor gives an explanation and review of label reading, grocery shopping for heart health, heart healthy recipe modifications, and ways to make healthier choices when eating out. Flowsheet Row CARDIAC REHAB PHASE II EXERCISE from 03/18/2016 in [place]  Date  02/21/16  Educator  RD  Instruction Review Code  Not applicable [class handouts given]      Diabetes Question & Answer:  -Group instruction provided by PowerPoint slides, verbal discussion, and written materials to support subject matter. The instructor gives an explanation and review of diabetes co-morbidities, pre- and post-prandial blood glucose goals, pre-exercise blood glucose goals, signs, symptoms, and treatment of hypoglycemia and hyperglycemia, and foot care basics.   Diabetes Blitz:  -Group instruction provided by PowerPoint slides, verbal discussion, and written materials to support subject matter. The instructor gives an explanation and review of the physiology behind type 1 and type 2 diabetes, diabetes medications and rational behind using different medications, pre- and post-prandial blood glucose recommendations and Hemoglobin A1c  goals, diabetes diet, and exercise including blood glucose guidelines for exercising safely.    Portion Distortion:  -Group instruction provided by PowerPoint slides, verbal discussion, written materials, and food models to support subject matter. The instructor gives an explanation of serving size versus portion size, changes in portions sizes over the last 20 years, and what consists of a serving from each food group. Flowsheet Row CARDIAC REHAB PHASE II EXERCISE from 03/18/2016 in [place]  Date  03/11/16  Educator  RD  Instruction Review Code  2- meets goals/outcomes      Stress Management:  -Group instruction provided by verbal instruction, video, and written materials to support subject matter.  Instructors review role of stress in heart disease and how to cope with stress positively.     Exercising on Your Own:  -Group instruction provided by verbal instruction, power point, and written materials to support subject.  Instructors discuss benefits of exercise, components of exercise, frequency and intensity of exercise, and end points for exercise.  Also discuss use of nitroglycerin and activating EMS.  Review options of places to exercise outside of rehab.  Review guidelines for sex with heart disease. Flowsheet Row CARDIAC REHAB PHASE II EXERCISE from 03/18/2016 in [place]  Date  02/26/16  Instruction Review Code  2- meets goals/outcomes      Cardiac Drugs I:  -Group instruction provided by verbal instruction and written materials to support subject.  Instructor reviews cardiac drug classes: antiplatelets, anticoagulants, beta blockers, and statins.  Instructor discusses reasons, side effects, and lifestyle considerations for each drug class. Flowsheet Row CARDIAC REHAB PHASE II EXERCISE from 03/18/2016 in [place]  Date  03/18/16  Educator  pharmd   Instruction Review Code  2- meets goals/outcomes      Cardiac Drugs II:  -Group instruction provided by verbal instruction and written materials to support subject.  Instructor reviews cardiac drug classes: angiotensin converting enzyme inhibitors (ACE-I), angiotensin II receptor blockers (ARBs), nitrates, and calcium channel blockers.  Instructor discusses reasons, side effects, and lifestyle considerations for each drug class. Flowsheet Row CARDIAC REHAB PHASE II EXERCISE from 03/18/2016 in [place]  Date  02/19/16  Educator  Pharm D  Instruction Review Code  2- meets goals/outcomes      Anatomy and Physiology of the Circulatory System:  -Group instruction provided by verbal instruction, video, and written materials to support subject.  Reviews functional anatomy of heart, how it relates to various diagnoses, and what role the heart plays in the overall system. Flowsheet Row CARDIAC REHAB PHASE II EXERCISE from 03/18/2016 in [place]  Date  02/12/16  Instruction Review Code  2- meets goals/outcomes      Knowledge Questionnaire Score:     Knowledge Questionnaire Score - 01/09/16 1443      Knowledge Questionnaire Score   Pre Score 23/24      Core Components/Risk Factors/Patient Goals at Admission:     Personal Goals and Risk Factors at Admission - 01/09/16 1004      Core Components/Risk Factors/Patient Goals on Admission    Weight Management Yes;Weight Loss   Intervention Weight Management: Develop a combined nutrition and exercise program designed to reach desired caloric intake, while maintaining appropriate intake of nutrient and fiber, sodium and fats, and appropriate energy expenditure required for the weight goal.;Weight Management: Provide education and appropriate resources to help participant work on  and attain dietary goals.;Weight Management/Obesity: Establish reasonable short term and long term weight goals.   Expected Outcomes Short Term: Continue to assess and modify interventions until short term weight is achieved;Weight Maintenance: Understanding of the daily nutrition guidelines, which includes 25-35% calories from fat, 7% or less cal from saturated fats, less than 260m cholesterol, less than 1.5gm of sodium, & 5 or more servings of fruits and vegetables daily;Long Term: Adherence to nutrition and physical activity/exercise program aimed toward attainment of established weight goal;Weight Loss: Understanding of  general recommendations for a balanced deficit meal plan, which promotes 1-2 lb weight loss per week and includes a negative energy balance of [phone] kcal/d;Understanding recommendations for meals to include 15-35% energy as protein, 25-35% energy from fat, 35-60% energy from carbohydrates, less than 2058mof dietary cholesterol, 20-35 gm of total fiber daily;Understanding of distribution of calorie intake throughout the day with the consumption of 4-5 meals/snacks   Increase Strength and Stamina Yes   Intervention Provide advice, education, support and counseling about physical activity/exercise needs.;Develop an individualized exercise prescription for aerobic and resistive training based on initial evaluation findings, risk stratification, comorbidities and participant's personal goals.   Expected Outcomes Achievement of increased cardiorespiratory fitness and enhanced flexibility, muscular endurance and strength shown through measurements of functional capacity and personal statement of participant.   Improve shortness of breath with ADL's Yes   Intervention Provide education, individualized exercise plan and daily activity instruction to help decrease symptoms of SOB with activities of daily living.   Expected Outcomes Short Term: Achieves a reduction of symptoms when performing activities of daily living.   Hypertension Yes   Intervention Provide education on lifestyle modifcations including regular physical activity/exercise, weight management, moderate sodium restriction and increased consumption of fresh fruit, vegetables, and low fat dairy, alcohol moderation, and smoking cessation.;Monitor prescription use compliance.   Expected Outcomes Short Term: Continued assessment and intervention until BP is < 140/9057mG in hypertensive participants. < 130/34m57m in hypertensive participants with diabetes, heart failure or chronic kidney disease.;Long Term: Maintenance of blood pressure at goal levels.    Lipids Yes   Intervention Provide education and support for participant on nutrition & aerobic/resistive exercise along with prescribed medications to achieve LDL <70mg73mL >40mg.62mxpected Outcomes Long Term: Cholesterol controlled with medications as prescribed, with individualized exercise RX and with personalized nutrition plan. Value goals: LDL < 70mg, 26m> 40 mg.;Short Term: Participant states understanding of desired cholesterol values and is compliant with medications prescribed. Participant is following exercise prescription and nutrition guidelines.   Stress Yes   Intervention Offer individual and/or small group education and counseling on adjustment to heart disease, stress management and health-related lifestyle change. Teach and support self-help strategies.;Refer participants experiencing significant psychosocial distress to appropriate mental health specialists for further evaluation and treatment. When possible, include family members and significant others in education/counseling sessions.   Expected Outcomes Short Term: Participant demonstrates changes in health-related behavior, relaxation and other stress management skills, ability to obtain effective social support, and compliance with psychotropic medications if prescribed.;Long Term: Emotional wellbeing is indicated by absence of clinically significant psychosocial distress or social isolation.   Personal Goal Other Yes   Personal Goal short: increase stamina, be able to walk for 30 minutes without rest breaks or fatigue  long[place] and know that heart is strong enough for activities/exercise   Intervention Provide education on cardiac disease, and provide exercise programming to improve cardiovascular fitness and confidence  Expected Outcomes Pt will be able to return to [facility] and be able to walk for 30 minutes without rest break or extreme fatigue.      Core Components/Risk  Factors/Patient Goals Review:      Goals and Risk Factor Review    Row Name 01/20/16 1727 02/14/16 1520 03/17/16 1201         Core Components/Risk Factors/Patient Goals Review   Personal Goals Review Other Other Increase Strength and Stamina;Other     Review Patient is walking 20-30 minutes without rest breaks. Patient is walking and riding stationary bike 30 minutes without rest breaks. Participant feels stamina is a lot better than when she started. She feels she is "almost back to normal". Participant is able to walk 30 minutes continuously with little fatigue.     Expected Outcomes Maintain walking program to increase cardiorespiratory fitness and being able to walk 30 minutes without rest breaks. Maintain walking program to increase cardiorespiratory fitness and being able to walk 30 minutes without rest breaks. Be able to walk 30 minutes without rest and without fatigue. Increase stength and stamina through aerobic and resistance training program.        Core Components/Risk Factors/Patient Goals at Discharge (Final Review):      Goals and Risk Factor Review - 03/17/16 1201      Core Components/Risk Factors/Patient Goals Review   Personal Goals Review Increase Strength and Stamina;Other   Review Participant feels stamina is a lot better than when she started. She feels she is "almost back to normal". Participant is able to walk 30 minutes continuously with little fatigue.   Expected Outcomes Be able to walk 30 minutes without rest and without fatigue. Increase stength and stamina through aerobic and resistance training program.      ITP Comments:     ITP Comments    Row Name 01/09/16 1001 03/13/16 1638         ITP Comments Dr. Fransico Him, Medical Director attended hypertension education lecture, goals met          Comments: Cherre is making expected progress toward personal goals after completing 29 sessions. Recommend continued exercise and life style modification  education including  stress management and relaxation techniques to decrease cardiac risk profile. Alexanderia is doing well with weight loss and exercise.Barnet Pall, RN,BSN 03/19/2016 3:36 PM

## 2016-03-20 ENCOUNTER — Encounter (HOSPITAL_COMMUNITY): Admission: RE | Admit: 2016-03-20 | Payer: 59 | Source: Ambulatory Visit

## 2016-03-23 ENCOUNTER — Encounter (HOSPITAL_COMMUNITY)
Admission: RE | Admit: 2016-03-23 | Discharge: 2016-03-23 | Disposition: A | Discharge status: Home / Self Care | Payer: 59 | Source: Ambulatory Visit | Attending: Cardiovascular Disease | Admitting: Cardiovascular Disease

## 2016-03-23 DIAGNOSIS — I214 Non-ST elevation (NSTEMI) myocardial infarction: Secondary | ICD-10-CM | POA: Diagnosis not present

## 2016-03-23 DIAGNOSIS — Z955 Presence of coronary angioplasty implant and graft: Secondary | ICD-10-CM

## 2016-03-25 ENCOUNTER — Encounter (HOSPITAL_COMMUNITY)
Admission: RE | Admit: 2016-03-25 | Discharge: 2016-03-25 | Disposition: A | Discharge status: Home / Self Care | Payer: 59 | Source: Ambulatory Visit | Attending: Cardiovascular Disease | Admitting: Cardiovascular Disease

## 2016-03-25 DIAGNOSIS — I214 Non-ST elevation (NSTEMI) myocardial infarction: Secondary | ICD-10-CM | POA: Diagnosis not present

## 2016-03-25 DIAGNOSIS — Z955 Presence of coronary angioplasty implant and graft: Secondary | ICD-10-CM

## 2016-03-27 ENCOUNTER — Encounter (HOSPITAL_COMMUNITY)
Admission: RE | Admit: 2016-03-27 | Discharge: 2016-03-27 | Disposition: A | Discharge status: Home / Self Care | Payer: 59 | Source: Ambulatory Visit | Attending: Cardiovascular Disease | Admitting: Cardiovascular Disease

## 2016-03-27 DIAGNOSIS — I214 Non-ST elevation (NSTEMI) myocardial infarction: Secondary | ICD-10-CM

## 2016-03-27 DIAGNOSIS — Z955 Presence of coronary angioplasty implant and graft: Secondary | ICD-10-CM

## 2016-03-30 ENCOUNTER — Encounter (HOSPITAL_COMMUNITY)
Admission: RE | Admit: 2016-03-30 | Discharge: 2016-03-30 | Disposition: A | Discharge status: Home / Self Care | Payer: 59 | Source: Ambulatory Visit | Attending: Cardiovascular Disease | Admitting: Cardiovascular Disease

## 2016-03-30 VITALS — Ht 63.8 in | Wt 174.2 lb

## 2016-03-30 DIAGNOSIS — I214 Non-ST elevation (NSTEMI) myocardial infarction: Secondary | ICD-10-CM | POA: Diagnosis not present

## 2016-03-30 DIAGNOSIS — Z955 Presence of coronary angioplasty implant and graft: Secondary | ICD-10-CM

## 2016-03-30 MED FILL — FUROSEMIDE 40 MG TABLET: 40 | 30 days supply | Qty: 30 | Fill #1

## 2016-03-30 MED FILL — SYNTHROID 150 MCG TABLET: 150 | 90 days supply | Qty: 90 | Fill #1

## 2016-04-01 ENCOUNTER — Encounter: Payer: Self-pay | Admitting: Cardiovascular Disease

## 2016-04-01 ENCOUNTER — Encounter (HOSPITAL_COMMUNITY)
Admission: RE | Admit: 2016-04-01 | Discharge: 2016-04-01 | Disposition: A | Discharge status: Home / Self Care | Payer: 59 | Source: Ambulatory Visit | Attending: Cardiovascular Disease | Admitting: Cardiovascular Disease

## 2016-04-01 ENCOUNTER — Telehealth: Payer: Self-pay | Admitting: Physician Assistant

## 2016-04-01 DIAGNOSIS — Z955 Presence of coronary angioplasty implant and graft: Secondary | ICD-10-CM

## 2016-04-01 DIAGNOSIS — I214 Non-ST elevation (NSTEMI) myocardial infarction: Secondary | ICD-10-CM

## 2016-04-01 DIAGNOSIS — I493 Ventricular premature depolarization: Secondary | ICD-10-CM

## 2016-04-01 NOTE — Progress Notes (Signed)
Brittany Mason was noted to have some intermittent triplets and a 4 beat run of PVC's today while on the nustep today at cardiac rehab. Patient asymptomatic. Blood pressure 122/80. Patient asymptomatic and tolerated the rest of exercise without difficulty. I called Brittany Mason PAC to notify. I also faxed today's ECG tracings for Brittany Mason to review. Brittany Mason had Dr Brittany Mason to review the ECG tracings and recommended that Brittany Mason have an office GXT and to have a office visit scheduled with the electrophysiologist. Brittany Mason know she is to hold off on returning to complete her last two exercise sessions until we receive clearance. Brittany Mason's exit heart rate was 68 and blood pressure 98/60.Will fax exercise flow sheets to Dr. Kyla Mason office for review.Brittany Pall, RN,BSN 04/01/2016 5:07 PM

## 2016-04-01 NOTE — Telephone Encounter (Addendum)
Orders placed for GXT/ referral to Dr. Curt Bears. Will notify PCC's to schedule.  Will also forward to Dr. Kyla Balzarine nurse Jeannene Patella to follow up on appointments.

## 2016-04-01 NOTE — Telephone Encounter (Signed)
Called by Verdis Frederickson, nurse with cardiac rehab, that patient was having occasional PVCs while on the Nu-step as well as occasional triplets and one 4 beat run. Chart reviewed in depth. H/o cardiac arrest 11/2015 with subsequent staged PCI and improvement of LV function by last assessment at cath 12/04/15. Per review of chart she has had PVCs, bigeminy, and short runs on the Nu-Step in the past as well. When this occurred in 01/2016, her lytes were totally normal with Mg of 2 and K of 4.3. She has since been placed on low dose BB. BP tends to run borderline so this has not been titrated any further. The patient was totally asymptomatic with these - exercised fine without complaint, has not had any recent issues at all. It seemed based on the compressed strip that was faxed, this tended to occur only during exercise on the Nu-Step. Reviewed with Dr. Johnsie Cancel who recommends we schedule the patient for a supervised ETT in our office to determine the extent/pattern of ectopy with exertion and also to arrange f/u with Dr. Curt Bears. It looks like from the chart her PVCs have been discussed with him in the past given her history of cardiac arrest, and at that time, the arrest was felt 2/2 her acute MI/cardiomyopathy which has since improved by subsequent cath 12/04/15. I spoke with the patient and Verdis Frederickson, cardiac rehab nurse, regarding these recommendations. Will forward to [facility] triage to help schedule - please call patient with ETT (schedule ASAP) and appointment with Dr. Curt Bears. (I did not fwrd to scheduling pool as I was told in the past they do not dip into telephone notes, and needed to document this encounter here).  I requested patient hold off on cardiac rehab until we can assemble the above information.  Dayna Dunn PA-C

## 2016-04-02 ENCOUNTER — Ambulatory Visit (INDEPENDENT_AMBULATORY_CARE_PROVIDER_SITE_OTHER): Payer: 59 | Admitting: Cardiology

## 2016-04-02 ENCOUNTER — Ambulatory Visit: Payer: 59

## 2016-04-02 VITALS — BP 116/72 | HR 49 | Ht 63.0 in | Wt 174.8 lb

## 2016-04-02 DIAGNOSIS — I493 Ventricular premature depolarization: Secondary | ICD-10-CM

## 2016-04-02 NOTE — Progress Notes (Signed)
Electrophysiology Office Note   Date:  04/02/2016   ID:  [name], DOB 1950/08/24, MRN [id number]  PCP:  Brittany Pac, MD  Cardiologist:  Brittany Mason Primary Electrophysiologist:  Brittany Meredith Leeds, MD    Chief Complaint  Patient presents with  . New Patient (Initial Visit)    PVC's     History of Present Illness: Brittany Mason is a 65 y.o. female who presents today for electrophysiology evaluation.   Hx PE, HLD, MVP, MI with cardiac arrest with 16 minutes down time.  Had totally occluded circumflex. Had stent placed in proximal circumflex with distal collaterals.   Found to have PVCs at cardiac rehab. Started on low dose beta blocker. Since that time, has had more PVCs.  Had occasional triplets and a 4 beat run. Patient was asymptomatic at the time.  He says that she's been progressing well with cardiac rehabilitation not having any issues. She is asymptomatic during cardiac rehabilitation and does not know that she has PVCs. She also does not know if she has them at rest.  Today, she denies symptoms of palpitations, chest pain, shortness of breath, orthopnea, PND, lower extremity edema, claudication, dizziness, presyncope, syncope, bleeding, or neurologic sequela. The patient is tolerating medications without difficulties and is otherwise without complaint today.    Past Medical History:  Diagnosis Date  . Acute pulmonary embolism (Brittany Mason) 06/20/2015  . Arthritis    osteoarthritis-knees. Chronic back pain  . Basal cell carcinoma of right shoulder    "burned off"  . Chronic SI joint pain   . DVT (deep venous thrombosis) ([place]) 04/2013   left lower leg, resulted in Pulmonary emboli on hormone therapy  . Dyspnea on exertion   . Hypercholesterolemia   . Hypothyroidism   . Migraine    "< once/month" (12/04/2015)  . MVP (mitral valve prolapse)    asymptomatic  . Myocardial infarction 11/19/2015   cardiac arrest  . PE (pulmonary embolism) Apr 18, 2013   tx. -using  Xarelto now, no further lung problems, denies SOB on 01-02-14  . PONV (postoperative nausea and vomiting)   . RLS (restless legs syndrome)    Past Surgical History:  Procedure Laterality Date  . ANTERIOR CRUCIATE LIGAMENT REPAIR Left 1976; 1981   "open"  . CARDIAC CATHETERIZATION N/A 11/20/2015   Procedure: Left Heart Cath and Coronary Angiography;  Surgeon: Belva Crome, MD;  Location: [place] [facility];  Service: Cardiovascular;  Laterality: N/A;  . CARDIAC CATHETERIZATION N/A 12/04/2015   Procedure: Left Heart Cath and Coronary Angiography;  Surgeon: Peter M Martinique, MD;  Location: [place] [facility];  Service: Cardiovascular;  Laterality: N/A;  . CARDIAC CATHETERIZATION  12/04/2015   Procedure: Coronary Stent Intervention;  Surgeon: Peter M Martinique, MD;  Location: [place] [facility];  Service: Cardiovascular;;  . COLONOSCOPY  09/28/2011   Procedure: COLONOSCOPY;  Surgeon: Brittany Craver, MD;  Location: [facility];  Service: Endoscopy;  Laterality: N/A;  . JOINT REPLACEMENT    . TOTAL ABDOMINAL HYSTERECTOMY  1998  . TOTAL KNEE ARTHROPLASTY Left 01/08/2014   Procedure: LEFT TOTAL KNEE ARTHROPLASTY;  Surgeon: Gearlean Alf, MD;  Location: [facility];  Service: Orthopedics;  Laterality: Left;     Current Outpatient Prescriptions  Medication Sig Dispense Refill  . aspirin 81 MG tablet Take 81 mg by mouth daily.    . butalbital-acetaminophen-caffeine (FIORICET) 50-325-40 MG tablet Take 1-2 tablets by mouth every 6 (six) hours as needed for headache. 20 tablet 0  .  clopidogrel (PLAVIX) 75 MG tablet Take 1 tablet (75 mg total) by mouth daily. 90 tablet 3  . cyclobenzaprine (FLEXERIL) 5 MG tablet Take 5 mg by mouth 3 (three) times daily as needed for muscle spasms.    Brittany Mason escitalopram (LEXAPRO) 20 MG tablet Take 20 mg by mouth every morning.     . fenofibrate 160 MG tablet Take 160 mg by mouth daily. Takes at bedtime    . levothyroxine (SYNTHROID, LEVOTHROID) 150 MCG tablet Take 150 mcg by mouth daily  before breakfast.     . metoprolol tartrate (LOPRESSOR) 25 MG tablet Take 0.5 tablets (12.5 mg total) by mouth 2 (two) times daily. 180 tablet 3  . morphine (MSIR) 30 MG tablet Take 30 mg by mouth every 12 (twelve) hours.    . nitroGLYCERIN (NITROSTAT) 0.4 MG SL tablet Place 1 tablet (0.4 mg total) under the tongue every 5 (five) minutes as needed for chest pain. 25 tablet 3  . omega-3 acid ethyl esters (LOVAZA) 1 G capsule Take 2 g by mouth 2 (two) times daily.    Brittany Mason OVER THE COUNTER MEDICATION Take 2 capsules by mouth daily. "Relizen" herbal supplement for hormone replacement     . ranitidine (ZANTAC) 150 MG tablet Take 150 mg by mouth daily.    Brittany Mason rOPINIRole (REQUIP) 0.5 MG tablet Take 0.5 mg by mouth every evening.     . simvastatin (ZOCOR) 20 MG tablet Take 1 tablet (20 mg total) by mouth at bedtime. 30 tablet 11   No current facility-administered medications for this visit.     Allergies:   Codeine and Erythromycin   Social History:  The patient  reports that she has never smoked. She has never used smokeless tobacco. She reports that she drinks alcohol. She reports that she does not use drugs.   Family History:  [name]  family history includes Breast cancer in her maternal aunt and paternal aunt; Cancer in her maternal aunt; Congestive Heart Failure in her mother; Coronary artery disease in her father; Hypertension in her father and mother; Stroke in her brother.    ROS:  Please see the history of present illness.   Otherwise, review of systems is positive for none.   All other systems are reviewed and negative.    PHYSICAL EXAM: VS:  BP 116/72 (BP Location: Left Arm)   Pulse (!) 49   Ht 5\' 3"  (1.6 m)   Wt 174 lb 12.8 oz (79.3 kg)   BMI 30.96 kg/m  , BMI Body mass index is 30.96 kg/m. GEN: Well nourished, well developed, in no acute distress  HEENT: normal  Neck: no JVD, carotid bruits, or masses Cardiac: bradycardic, regular; no murmurs, rubs, or gallops,no edema    Respiratory:  clear to auscultation bilaterally, normal work of breathing GI: soft, nontender, nondistended, + BS MS: no deformity or atrophy  Skin: warm and dry Neuro:  Strength and sensation are intact Psych: euthymic mood, full affect  EKG:  EKG is ordered today. Personal review of the ekg ordered shows sinus rhythm, rate 49  Recent Labs: 12/02/2015: ALT 58; Brain Natriuretic Peptide 170.6 12/05/2015: Hemoglobin 11.2; Platelets 527 01/20/2016: BUN 17; Creat 0.84; Magnesium 2.0; Potassium 4.3; Sodium 141; TSH 0.39    Lipid Panel     Component Value Date/Time   CHOL 181 11/19/2015 2036   TRIG 186 (H) 11/21/2015 0300   HDL 66 11/19/2015 2036   CHOLHDL 2.7 11/19/2015 2036   VLDL 21 11/19/2015 2036   LDLCALC 94  11/19/2015 2036     Wt Readings from Last 3 Encounters:  04/02/16 174 lb 12.8 oz (79.3 kg)  03/30/16 174 lb 2.6 oz (79 kg)  03/02/16 171 lb 1.9 oz (77.6 kg)      Other studies Reviewed: Additional studies/ records that were reviewed today include: Cath 12/04/15, TTE 11/20/15  Review of the above records today demonstrates:   Prox RCA to Mid RCA lesion, 30% stenosed.  RPDA lesion, 60% stenosed.  Prox Cx to Mid Cx lesion, 100% stenosed.  There is mild left ventricular systolic dysfunction.  Prox Cx lesion, 99% stenosed. Post intervention, there is a 0% residual stenosis.   1. Single vessel obstructive CAD 2. Mild LV dysfunction. 3. Low LVEDP 4. Successful stenting of the proximal LCx into a large bifurcating OM1. The distal LCx is still occluded with right to left collaterals. This branch is relatively small.   - Left ventricle: The cavity size was normal. There was mild focal   basal hypertrophy of the septum. Systolic function was moderately   reduced. The estimated ejection fraction was in the range of 35%   to 40%. Diffuse hypokinesis. There is akinesis of the   mid-apicalinferolateral and inferior myocardium. Doppler   parameters are consistent with  abnormal left ventricular   relaxation (grade 1 diastolic dysfunction). - Aortic valve: Trileaflet; mildly thickened, mildly calcified   leaflets. There was mild regurgitation directed eccentrically in   the LVOT. - Aorta: Aortic root dimension: 38 mm (ED). - Ascending aorta: The ascending aorta was mildly dilated. - Mitral valve: Calcified annulus. - Right ventricle: The cavity size was moderately dilated. Wall   thickness was normal. Systolic function was moderately reduced.  ASSESSMENT AND PLAN: 1. V-fib Cardiac Arrest/ NSTEMI - with LV dysfunction -Cath on 6/14 showed total occlusion of prox circumflex. S/p PCI to the LCX note indicated distal circumflex still occluded with collaterals.    2. Ischemic CM - mild LV dysfunction noted on repeat cath. Life Vest is off. Doing very well clinically.repeat MRI to assess EF 3 months post intervention which was done 12/04/15  3. Hyperlipidemia: on Zocor and CoQ 10  4. PVCs: EF on her last echo was 35-40%. There are notes in epic that says that it is, to 45-50%, but I do not see a study documenting this. We Brittany therefore we Brittany for echocardiogram to further assess her ejection fraction. We Brittany also schedule her for an exercise treadmill test to further quantify if she has any exercise-induced arrhythmias. In future, she Brittany likely benefit from an EP study to rule out inducible ventricular tachycardia and the need for a defibrillator.  Current medicines are reviewed at length with the patient today.   The patient does not have concerns regarding her medicines.  The following changes were made today:  none  Labs/ tests ordered today include: Orders Placed This Encounter  Procedures  . EKG 12-Lead     Disposition:   FU with Brittany Camnitz 6 weeks  Signed, Brittany Meredith Leeds, MD  04/02/2016 10:15 AM     [facility] [address] [place] [address] [phone] (office) [phone] (fax)

## 2016-04-02 NOTE — Patient Instructions (Signed)
Medication Instructions:    Your physician recommends that you continue on your current medications as directed. Please refer to the Current Medication list given to you today.  --- If you need a refill on your cardiac medications before your next appointment, please call your pharmacy. ---  Labwork:  None ordered  Testing/Procedures:  Please move currently scheduled echocardiogram to next available.   Reschedule stress test to next week with Dr. Curt Bears supervising testing.  Follow-Up:  Your physician recommends that you schedule a follow-up appointment in: 6 weeks with Dr. Curt Bears  (this may change depending on stress test result)  Thank you for choosing [facility]!!   Trinidad Curet, RN [phone]

## 2016-04-03 ENCOUNTER — Encounter (HOSPITAL_COMMUNITY): Payer: 59

## 2016-04-06 ENCOUNTER — Encounter (HOSPITAL_COMMUNITY): Payer: 59

## 2016-04-08 ENCOUNTER — Encounter (HOSPITAL_COMMUNITY): Payer: 59

## 2016-04-08 ENCOUNTER — Ambulatory Visit: Payer: 59 | Admitting: Cardiology

## 2016-04-08 ENCOUNTER — Ambulatory Visit (INDEPENDENT_AMBULATORY_CARE_PROVIDER_SITE_OTHER): Payer: 59

## 2016-04-08 DIAGNOSIS — I493 Ventricular premature depolarization: Secondary | ICD-10-CM | POA: Diagnosis not present

## 2016-04-08 LAB — EXERCISE TOLERANCE TEST
CSEPED: 9 min
CSEPEDS: 0 s
CSEPEW: 10.1 METS
CSEPHR: 89 %
CSEPPHR: 141 {beats}/min
MPHR: 157 {beats}/min
RPE: 15
Rest HR: 54 {beats}/min

## 2016-04-09 MED FILL — FENOFIBRATE 160 MG TABLET: 160 | 90 days supply | Qty: 90 | Fill #2

## 2016-04-09 MED FILL — METOPROLOL TARTRATE 25 MG T: 25 | 90 days supply | Qty: 90 | Fill #1

## 2016-04-10 ENCOUNTER — Ambulatory Visit (HOSPITAL_COMMUNITY): Payer: 59 | Attending: Cardiovascular Disease

## 2016-04-10 ENCOUNTER — Other Ambulatory Visit: Payer: Self-pay

## 2016-04-10 ENCOUNTER — Encounter (HOSPITAL_COMMUNITY): Payer: 59

## 2016-04-10 DIAGNOSIS — E669 Obesity, unspecified: Secondary | ICD-10-CM | POA: Insufficient documentation

## 2016-04-10 DIAGNOSIS — Z6831 Body mass index (BMI) 31.0-31.9, adult: Secondary | ICD-10-CM | POA: Insufficient documentation

## 2016-04-10 DIAGNOSIS — E7800 Pure hypercholesterolemia, unspecified: Secondary | ICD-10-CM | POA: Insufficient documentation

## 2016-04-10 DIAGNOSIS — I255 Ischemic cardiomyopathy: Secondary | ICD-10-CM | POA: Diagnosis not present

## 2016-04-10 DIAGNOSIS — I351 Nonrheumatic aortic (valve) insufficiency: Secondary | ICD-10-CM | POA: Insufficient documentation

## 2016-04-13 ENCOUNTER — Encounter (HOSPITAL_COMMUNITY): Payer: 59

## 2016-04-15 ENCOUNTER — Encounter (HOSPITAL_COMMUNITY): Payer: 59

## 2016-04-15 ENCOUNTER — Encounter: Payer: Self-pay | Admitting: Cardiology

## 2016-04-15 ENCOUNTER — Encounter: Payer: Self-pay | Admitting: Cardiovascular Disease

## 2016-04-16 ENCOUNTER — Telehealth (HOSPITAL_COMMUNITY): Payer: Self-pay | Admitting: *Deleted

## 2016-04-16 NOTE — Progress Notes (Signed)
Cardiac Individual Treatment Plan  Patient Details  Name: Brittany Mason MRN: [id number] Date of Birth: 02-Feb-1951 Referring Provider:   Flowsheet Row CARDIAC REHAB PHASE II ORIENTATION from 01/09/2016 in [place]  Referring Provider  Jenkins Rouge, MD      Initial Encounter Date:  [place] PHASE II ORIENTATION from 01/09/2016 in [place]  Date  01/09/16  Referring Provider  Jenkins Rouge, MD      Visit Diagnosis: 11/19/15 V fib arrrest; NSTEMI (non-ST elevated myocardial infarction) ([place])  12/04/15 Status post coronary artery stent placement  Patient's Home Medications on Admission:  Current Outpatient Prescriptions:  .  aspirin 81 MG tablet, Take 81 mg by mouth daily., Disp: , Rfl:  .  butalbital-acetaminophen-caffeine (FIORICET) 50-325-40 MG tablet, Take 1-2 tablets by mouth every 6 (six) hours as needed for headache., Disp: 20 tablet, Rfl: 0 .  clopidogrel (PLAVIX) 75 MG tablet, Take 1 tablet (75 mg total) by mouth daily., Disp: 90 tablet, Rfl: 3 .  cyclobenzaprine (FLEXERIL) 5 MG tablet, Take 5 mg by mouth 3 (three) times daily as needed for muscle spasms., Disp: , Rfl:  .  escitalopram (LEXAPRO) 20 MG tablet, Take 20 mg by mouth every morning. , Disp: , Rfl:  .  fenofibrate 160 MG tablet, Take 160 mg by mouth daily. Takes at bedtime, Disp: , Rfl:  .  levothyroxine (SYNTHROID, LEVOTHROID) 150 MCG tablet, Take 150 mcg by mouth daily before breakfast. , Disp: , Rfl:  .  metoprolol tartrate (LOPRESSOR) 25 MG tablet, Take 0.5 tablets (12.5 mg total) by mouth 2 (two) times daily., Disp: 180 tablet, Rfl: 3 .  morphine (MSIR) 30 MG tablet, Take 30 mg by mouth every 12 (twelve) hours., Disp: , Rfl:  .  nitroGLYCERIN (NITROSTAT) 0.4 MG SL tablet, Place 1 tablet (0.4 mg total) under the tongue every 5 (five) minutes as needed for chest pain., Disp: 25 tablet, Rfl: 3 .  omega-3 acid ethyl esters (LOVAZA) 1 G  capsule, Take 2 g by mouth 2 (two) times daily., Disp: , Rfl:  .  OVER THE COUNTER MEDICATION, Take 2 capsules by mouth daily. "Relizen" herbal supplement for hormone replacement , Disp: , Rfl:  .  ranitidine (ZANTAC) 150 MG tablet, Take 150 mg by mouth daily., Disp: , Rfl:  .  rOPINIRole (REQUIP) 0.5 MG tablet, Take 0.5 mg by mouth every evening. , Disp: , Rfl:  .  simvastatin (ZOCOR) 20 MG tablet, Take 1 tablet (20 mg total) by mouth at bedtime., Disp: 30 tablet, Rfl: 11  Past Medical History: Past Medical History:  Diagnosis Date  . Acute pulmonary embolism ([place]) 06/20/2015  . Arthritis    osteoarthritis-knees. Chronic back pain  . Basal cell carcinoma of right shoulder    "burned off"  . Chronic SI joint pain   . DVT (deep venous thrombosis) ([place]) 04/2013   left lower leg, resulted in Pulmonary emboli on hormone therapy  . Dyspnea on exertion   . Hypercholesterolemia   . Hypothyroidism   . Migraine    "< once/month" (12/04/2015)  . MVP (mitral valve prolapse)    asymptomatic  . Myocardial infarction 11/19/2015   cardiac arrest  . PE (pulmonary embolism) Apr 18, 2013   tx. -using Xarelto now, no further lung problems, denies SOB on 01-02-14  . PONV (postoperative nausea and vomiting)   . RLS (restless legs syndrome)     Tobacco Use: History  Smoking Status  .  Never Smoker  Smokeless Tobacco  . Never Used    Labs: Recent Review Flowsheet Data    Labs for ITP Cardiac and Pulmonary Rehab Latest Ref Rng & Units 11/20/2015 11/20/2015 11/20/2015 11/21/2015 11/22/2015   Cholestrol 0 - 200 mg/dL - - - - -   LDLCALC 0 - 99 mg/dL - - - - -   HDL >40 mg/dL - - - - -   Trlycerides <150 mg/dL - 82 - 186(H) -   Hemoglobin A1c 4.8 - 5.6 % - - - - -   PHART 7.350 - 7.450 7.256(L) - 7.157(LL) 7.429 7.276(L)   PCO2ART 35.0 - 45.0 mmHg 46.0(H) - 55.1(H) 27.4(L) 50.4(H)   HCO3 20.0 - 24.0 mEq/L 20.2 - 19.5(L) 17.8(L) 22.7   TCO2 0 - 100 mmol/L 22 - 21 18.7 24.3   ACIDBASEDEF 0.0 - 2.0  mmol/L 6.0(H) - 9.0(H) 5.7(H) 3.1(H)   O2SAT % 99.0 - 97.0 98.8 98.2      Capillary Blood Glucose: Lab Results  Component Value Date   GLUCAP 89 11/25/2015   GLUCAP 116 (H) 11/24/2015   GLUCAP 112 (H) 11/24/2015   GLUCAP 95 11/24/2015   GLUCAP 107 (H) 11/23/2015     Exercise Target Goals:    Exercise Program Goal: Individual exercise prescription set with THRR, safety & activity barriers. Participant demonstrates ability to understand and report RPE using BORG scale, to self-measure pulse accurately, and to acknowledge the importance of the exercise prescription.  Exercise Prescription Goal: Starting with aerobic activity 30 plus minutes a day, 3 days per week for initial exercise prescription. Provide home exercise prescription and guidelines that participant acknowledges understanding prior to discharge.  Activity Barriers & Risk Stratification:     Activity Barriers & Cardiac Risk Stratification - 01/09/16 1004      Activity Barriers & Cardiac Risk Stratification   Activity Barriers Back Problems;Left Knee Replacement   Cardiac Risk Stratification High      6 Minute Walk:     6 Minute Walk    Row Name 01/09/16 1342 03/30/16 1627       6 Minute Walk   Phase Initial Discharge    Distance 1603 feet 1893 feet    Distance % Change  - 18.09 %    Walk Time 6 minutes 6 minutes    # of Rest Breaks 0 0    MPH 3.03 3.59    METS 3.83 4.36    RPE 12 11    Perceived Dyspnea   - 0    VO2 Peak 13.41 15.26    Symptoms No No    Resting HR 67 bpm 59 bpm    Resting BP 104/60 100/62    Max Ex. HR 110 bpm 110 bpm    Max Ex. BP 142/78 142/82    2 Minute Post BP 100/60 102/67       Initial Exercise Prescription:     Initial Exercise Prescription - 01/09/16 1300      Date of Initial Exercise RX and Referring Provider   Date 01/09/16   Referring Provider Jenkins Rouge, MD     Treadmill   MPH 2.6   Grade 1   Minutes 10   METs 3.35     Bike   Level 0.7   Minutes  10   METs 2.65     NuStep   Level 3   Minutes 10   METs 2     Prescription Details   Frequency (times per week) 3  Duration Progress to 30 minutes of continuous aerobic without signs/symptoms of physical distress     Intensity   THRR 40-80% of Max Heartrate 63-126   Ratings of Perceived Exertion 11-13   Perceived Dyspnea 0-4     Progression   Progression Continue to progress workloads to maintain intensity without signs/symptoms of physical distress.     Resistance Training   Training Prescription Yes   Weight 2lbs   Reps 10-12      Perform Capillary Blood Glucose checks as needed.  Exercise Prescription Changes:      Exercise Prescription Changes    Row Name 01/24/16 0700 02/17/16 0900 03/17/16 1200 04/01/16 1100 04/16/16 1000     Exercise Review   Progression Yes Yes Yes Yes Yes     Response to Exercise   Blood Pressure (Admit) 100/60 108/58 112/58 120/70 102/64   Blood Pressure (Exercise) 120/60 138/70 140/64 134/72 122/80   Blood Pressure (Exit) 104/64 112/60 109/66 103/68 98/68   Heart Rate (Admit) 73 bpm 68 bpm 84 bpm 60 bpm 59 bpm   Heart Rate (Exercise) 117 bpm 108 bpm 137 bpm 119 bpm 121 bpm   Heart Rate (Exit) 94 bpm 68 bpm 90 bpm 70 bpm 68 bpm   Rating of Perceived Exertion (Exercise) '11 13 12 13 13   '$ Comments Reviewed home exercise guidelines on 01/20/16. Reviewed home exercise guidelines on 01/20/16. Reviewed home exercise guidelines on 01/20/16. Reviewed home exercise guidelines on 01/20/16. Reviewed home exercise guidelines on 01/20/16.   Duration Progress to 30 minutes of continuous aerobic without signs/symptoms of physical distress Progress to 30 minutes of continuous aerobic without signs/symptoms of physical distress Progress to 30 minutes of continuous aerobic without signs/symptoms of physical distress Progress to 30 minutes of continuous aerobic without signs/symptoms of physical distress Progress to 30 minutes of continuous aerobic without  signs/symptoms of physical distress   Intensity THRR unchanged THRR unchanged THRR unchanged THRR unchanged THRR unchanged     Progression   Progression Continue to progress workloads to maintain intensity without signs/symptoms of physical distress. Continue to progress workloads to maintain intensity without signs/symptoms of physical distress. Continue to progress workloads to maintain intensity without signs/symptoms of physical distress. Continue to progress workloads to maintain intensity without signs/symptoms of physical distress. Continue to progress workloads to maintain intensity without signs/symptoms of physical distress.   Average METs 3 3.4 4.9 4.6 4.9     Resistance Training   Training Prescription Yes Yes Yes Yes Yes   Weight 2lbs 4lbs 5LBS 5LBS 5LBS   Reps 10-12 10-12 10-12 10-12 10-12     Interval Training   Interval Training No No No No No     Treadmill   MPH 2.6 2.8 2.8 2.8 2.8   Grade '1 2 2 2 2   '$ Minutes '10 10 10 10 10   '$ METs 3.35 3.91 3.91 3.91 3.91     Bike   Level -  -  -  -  -   Minutes -  -  -  -  -   METs -  -  -  -  -     NuStep   Level '3 5 5 5 5   '$ Minutes '10 10 10 10 10   '$ METs 3 3.5 4.3 4.3 4.5     Arm Ergometer   Level '2 2 2 '$ -  -   Minutes '10 10 10 '$ -  -   METs 2.1 2.66 2.66 -  -  Adaptive Motion Trainer   Resistance  -  - '1 1 1   '$ Grade  -  - '1 1 1   '$ Minutes  -  - '10 10 10   '$ METs  -  - 6.4 5.8 6.3     Home Exercise Plan   Plans to continue exercise at [place]   Frequency Add 4 additional days to program exercise sessions. Add 4 additional days to program exercise sessions. Add 4 additional days to program exercise sessions. Add 4 additional days to program exercise sessions. Add 4 additional days to program exercise sessions.      Exercise Comments:      Exercise Comments    Row Name 01/20/16 1726 02/14/16 1518 03/17/16 1154 04/01/16 1110 04/16/16 1018   Exercise Comments Doing well with exercise. Reviewed home  exercise guidelines. Jayani is walking and riding stationary bike 30 minutes, 2 days per week. Pt would like to try elliptical later in the program. Continues to make great progress with exercise. Pt has increased duration on the elliptical.  Continues to make great progress with exercise.  Continues to make great progress with exercise.  Pt has been absent for a week awaiting results from stress test/echo.  Pt will return to exercise 04/17/16 with Dr. approval       Discharge Exercise Prescription (Final Exercise Prescription Changes):     Exercise Prescription Changes - 04/16/16 1000      Exercise Review   Progression Yes     Response to Exercise   Blood Pressure (Admit) 102/64   Blood Pressure (Exercise) 122/80   Blood Pressure (Exit) 98/68   Heart Rate (Admit) 59 bpm   Heart Rate (Exercise) 121 bpm   Heart Rate (Exit) 68 bpm   Rating of Perceived Exertion (Exercise) 13   Comments Reviewed home exercise guidelines on 01/20/16.   Duration Progress to 30 minutes of continuous aerobic without signs/symptoms of physical distress   Intensity THRR unchanged     Progression   Progression Continue to progress workloads to maintain intensity without signs/symptoms of physical distress.   Average METs 4.9     Resistance Training   Training Prescription Yes   Weight 5LBS   Reps 10-12     Interval Training   Interval Training No     Treadmill   MPH 2.8   Grade 2   Minutes 10   METs 3.91     NuStep   Level 5   Minutes 10   METs 4.5     Adaptive Motion Trainer   Resistance 1   Grade 1   Minutes 10   METs 6.3     Home Exercise Plan   Plans to continue exercise at Home   Frequency Add 4 additional days to program exercise sessions.      Nutrition:  Target Goals: Understanding of nutrition guidelines, daily intake of sodium '1500mg'$ , cholesterol '200mg'$ , calories 30% from fat and 7% or less from saturated fats, daily to have 5 or more servings of fruits and  vegetables.  Biometrics:     Pre Biometrics - 01/13/16 1645      Pre Biometrics   Height 5' 3.5" (1.613 m)   Weight 174 lb 9.7 oz (79.2 kg)   Waist Circumference 41 inches   Hip Circumference 45 inches   Waist to Hip Ratio 0.91 %   BMI (Calculated) 30.5   Triceps Skinfold 28 mm   % Body Fat 42.3 %   Grip  Strength 33 kg   Flexibility 13 in   Single Leg Stand 30 seconds         Post Biometrics - 03/30/16 1629       Post  Biometrics   Height 5' 3.8" (1.621 m)   Weight 174 lb 2.6 oz (79 kg)   Waist Circumference 39.5 inches   Hip Circumference 45 inches   Waist to Hip Ratio 0.88 %   BMI (Calculated) 30.1   Triceps Skinfold 22 mm   % Body Fat 40.2 %   Grip Strength 40 kg   Flexibility 17.5 in   Single Leg Stand 30 seconds      Nutrition Therapy Plan and Nutrition Goals:     Nutrition Therapy & Goals - 01/10/16 1403      Nutrition Therapy   Diet Therapeutic Lifestyle Changes     Personal Nutrition Goals   Personal Goal #1 1-2 lb wt loss/week to a wt loss goal of 6-24 lb at graduation from [facility], educate and counsel regarding individualized specific dietary modifications aiming towards targeted core components such as weight, hypertension, lipid management, diabetes, heart failure and other comorbidities.   Expected Outcomes Short Term Goal: Understand basic principles of dietary content, such as calories, fat, sodium, cholesterol and nutrients.;Long Term Goal: Adherence to prescribed nutrition plan.      Nutrition Discharge: Nutrition Scores:     Nutrition Assessments - 02/21/16 1612      MEDFICTS Scores   Pre Score 33      Nutrition Goals Re-Evaluation:   Psychosocial: Target Goals: Acknowledge presence or absence of depression, maximize coping skills, provide positive support system. Participant is able to verbalize types and ability to use techniques and skills needed for reducing stress and  depression.  Initial Review & Psychosocial Screening:     Initial Psych Review & Screening - 01/13/16 1627      Family Dynamics   Good Support System? Yes     Barriers   Psychosocial barriers to participate in program There are no identifiable barriers or psychosocial needs.     Screening Interventions   Interventions Encouraged to exercise      Quality of Life Scores:     Quality of Life - 01/09/16 1419      Quality of Life Scores   Health/Function Pre 23.67 %   Socioeconomic Pre 27.57 %   Psych/Spiritual Pre 23.36 %   Family Pre 28 %   GLOBAL Pre 24.86 %      PHQ-9: Recent Review Flowsheet Data    There is no flowsheet data to display.      Psychosocial Evaluation and Intervention:   Psychosocial Re-Evaluation:     Psychosocial Re-Evaluation    [place] Name 01/22/16 1704 02/19/16 1808 03/19/16 1436 04/16/16 1009       Psychosocial Re-Evaluation   Interventions Encouraged to attend Cardiac Rehabilitation for the exercise Encouraged to attend Cardiac Rehabilitation for the exercise Encouraged to attend Cardiac Rehabilitation for the exercise Encouraged to attend Cardiac Rehabilitation for the exercise    Continued Psychosocial Services Needed No No No No       Vocational Rehabilitation: Provide vocational rehab assistance to qualifying candidates.   Vocational Rehab Evaluation & Intervention:     Vocational Rehab - 01/09/16 1546      Initial Vocational Rehab Evaluation & Intervention   Assessment shows need for Vocational Rehabilitation No  Pt feels she will be able to return  back to work without any difficulty.      Education: Education Goals: Education classes will be provided on a weekly basis, covering required topics. Participant will state understanding/return demonstration of topics presented.  Learning Barriers/Preferences:     Learning Barriers/Preferences - 01/09/16 1445      Learning Barriers/Preferences   Learning Barriers Sight    Learning Preferences Skilled Demonstration;Pictoral;Video      Education Topics: Count Your Pulse:  -Group instruction provided by verbal instruction, demonstration, patient participation and written materials to support subject.  Instructors address importance of being able to find your pulse and how to count your pulse when at home without a heart monitor.  Patients get hands on experience counting their pulse with staff help and individually. Flowsheet Row CARDIAC REHAB PHASE II EXERCISE from 03/25/2016 in [place]  Date  02/07/16  Educator  Barnet Pall, RN  Instruction Review Code  2- meets goals/outcomes      Heart Attack, Angina, and Risk Factor Modification:  -Group instruction provided by verbal instruction, video, and written materials to support subject.  Instructors address signs and symptoms of angina and heart attacks.    Also discuss risk factors for heart disease and how to make changes to improve heart health risk factors. Flowsheet Row CARDIAC REHAB PHASE II EXERCISE from 03/25/2016 in [place]  Date  03/25/16  Educator  RN  Instruction Review Code  2- meets goals/outcomes      Functional Fitness:  -Group instruction provided by verbal instruction, demonstration, patient participation, and written materials to support subject.  Instructors address safety measures for doing things around the house.  Discuss how to get up and down off the floor, how to pick things up properly, how to safely get out of a chair without assistance, and balance training. Flowsheet Row CARDIAC REHAB PHASE II EXERCISE from 03/25/2016 in [place]  Date  01/24/16  Educator  EP  Instruction Review Code  2- meets goals/outcomes      Meditation and Mindfulness:  -Group instruction provided by verbal instruction, patient participation, and written materials to support subject.  Instructor  addresses importance of mindfulness and meditation practice to help reduce stress and improve awareness.  Instructor also leads participants through a meditation exercise.  Flowsheet Row CARDIAC REHAB PHASE II EXERCISE from 03/25/2016 in [place]  Date  02/05/16  Educator  Jeanella Craze  Instruction Review Code  2- meets goals/outcomes      Stretching for Flexibility and Mobility:  -Group instruction provided by verbal instruction, patient participation, and written materials to support subject.  Instructors lead participants through series of stretches that are designed to increase flexibility thus improving mobility.  These stretches are additional exercise for major muscle groups that are typically performed during regular warm up and cool down.   Hands Only CPR Anytime:  -Group instruction provided by verbal instruction, video, patient participation and written materials to support subject.  Instructors co-teach with AHA video for hands only CPR.  Participants get hands on experience with mannequins.   Nutrition I class: Heart Healthy Eating:  -Group instruction provided by PowerPoint slides, verbal discussion, and written materials to support subject matter. The instructor gives an explanation and review of the Therapeutic Lifestyle Changes diet recommendations, which includes a discussion on lipid goals, dietary fat, sodium, fiber, plant stanol/sterol esters, sugar, and the components of a well-balanced, healthy diet. [place]  PHASE II EXERCISE from 03/25/2016 in [place]  Date  02/21/16  Educator  RD  Instruction Review Code  Not applicable [class handouts given]      Nutrition II class: Lifestyle Skills:  -Group instruction provided by PowerPoint slides, verbal discussion, and written materials to support subject matter. The instructor gives an explanation and review of label reading, grocery  shopping for heart health, heart healthy recipe modifications, and ways to make healthier choices when eating out. Flowsheet Row CARDIAC REHAB PHASE II EXERCISE from 03/25/2016 in [place]  Date  02/21/16  Educator  RD  Instruction Review Code  Not applicable [class handouts given]      Diabetes Question & Answer:  -Group instruction provided by PowerPoint slides, verbal discussion, and written materials to support subject matter. The instructor gives an explanation and review of diabetes co-morbidities, pre- and post-prandial blood glucose goals, pre-exercise blood glucose goals, signs, symptoms, and treatment of hypoglycemia and hyperglycemia, and foot care basics.   Diabetes Blitz:  -Group instruction provided by PowerPoint slides, verbal discussion, and written materials to support subject matter. The instructor gives an explanation and review of the physiology behind type 1 and type 2 diabetes, diabetes medications and rational behind using different medications, pre- and post-prandial blood glucose recommendations and Hemoglobin A1c goals, diabetes diet, and exercise including blood glucose guidelines for exercising safely.    Portion Distortion:  -Group instruction provided by PowerPoint slides, verbal discussion, written materials, and food models to support subject matter. The instructor gives an explanation of serving size versus portion size, changes in portions sizes over the last 20 years, and what consists of a serving from each food group. Flowsheet Row CARDIAC REHAB PHASE II EXERCISE from 03/25/2016 in [place]  Date  03/11/16  Educator  RD  Instruction Review Code  2- meets goals/outcomes      Stress Management:  -Group instruction provided by verbal instruction, video, and written materials to support subject matter.  Instructors review role of stress in heart disease and how to cope with stress  positively.     Exercising on Your Own:  -Group instruction provided by verbal instruction, power point, and written materials to support subject.  Instructors discuss benefits of exercise, components of exercise, frequency and intensity of exercise, and end points for exercise.  Also discuss use of nitroglycerin and activating EMS.  Review options of places to exercise outside of rehab.  Review guidelines for sex with heart disease. Flowsheet Row CARDIAC REHAB PHASE II EXERCISE from 03/25/2016 in [place]  Date  02/26/16  Instruction Review Code  2- meets goals/outcomes      Cardiac Drugs I:  -Group instruction provided by verbal instruction and written materials to support subject.  Instructor reviews cardiac drug classes: antiplatelets, anticoagulants, beta blockers, and statins.  Instructor discusses reasons, side effects, and lifestyle considerations for each drug class. Flowsheet Row CARDIAC REHAB PHASE II EXERCISE from 03/25/2016 in [place]  Date  03/18/16  Educator  pharmd   Instruction Review Code  2- meets goals/outcomes      Cardiac Drugs II:  -Group instruction provided by verbal instruction and written materials to support subject.  Instructor reviews cardiac drug classes: angiotensin converting enzyme inhibitors (ACE-I), angiotensin II receptor blockers (ARBs), nitrates, and calcium channel blockers.  Instructor discusses reasons, side effects, and lifestyle considerations for each drug class. [place]  REHAB PHASE II EXERCISE from 03/25/2016 in [place]  Date  02/19/16  Educator  Pharm D  Instruction Review Code  2- meets goals/outcomes      Anatomy and Physiology of the Circulatory System:  -Group instruction provided by verbal instruction, video, and written materials to support subject.  Reviews functional anatomy of heart, how it relates to various  diagnoses, and what role the heart plays in the overall system. Flowsheet Row CARDIAC REHAB PHASE II EXERCISE from 03/25/2016 in [place]  Date  02/12/16  Instruction Review Code  2- meets goals/outcomes      Knowledge Questionnaire Score:     Knowledge Questionnaire Score - 01/09/16 1443      Knowledge Questionnaire Score   Pre Score 23/24      Core Components/Risk Factors/Patient Goals at Admission:     Personal Goals and Risk Factors at Admission - 01/09/16 1004      Core Components/Risk Factors/Patient Goals on Admission    Weight Management Yes;Weight Loss   Intervention Weight Management: Develop a combined nutrition and exercise program designed to reach desired caloric intake, while maintaining appropriate intake of nutrient and fiber, sodium and fats, and appropriate energy expenditure required for the weight goal.;Weight Management: Provide education and appropriate resources to help participant work on and attain dietary goals.;Weight Management/Obesity: Establish reasonable short term and long term weight goals.   Expected Outcomes Short Term: Continue to assess and modify interventions until short term weight is achieved;Weight Maintenance: Understanding of the daily nutrition guidelines, which includes 25-35% calories from fat, 7% or less cal from saturated fats, less than '200mg'$  cholesterol, less than 1.5gm of sodium, & 5 or more servings of fruits and vegetables daily;Long Term: Adherence to nutrition and physical activity/exercise program aimed toward attainment of established weight goal;Weight Loss: Understanding of general recommendations for a balanced deficit meal plan, which promotes 1-2 lb weight loss per week and includes a negative energy balance of [phone] kcal/d;Understanding recommendations for meals to include 15-35% energy as protein, 25-35% energy from fat, 35-60% energy from carbohydrates, less than '200mg'$  of dietary  cholesterol, 20-35 gm of total fiber daily;Understanding of distribution of calorie intake throughout the day with the consumption of 4-5 meals/snacks   Increase Strength and Stamina Yes   Intervention Provide advice, education, support and counseling about physical activity/exercise needs.;Develop an individualized exercise prescription for aerobic and resistive training based on initial evaluation findings, risk stratification, comorbidities and participant's personal goals.   Expected Outcomes Achievement of increased cardiorespiratory fitness and enhanced flexibility, muscular endurance and strength shown through measurements of functional capacity and personal statement of participant.   Improve shortness of breath with ADL's Yes   Intervention Provide education, individualized exercise plan and daily activity instruction to help decrease symptoms of SOB with activities of daily living.   Expected Outcomes Short Term: Achieves a reduction of symptoms when performing activities of daily living.   Hypertension Yes   Intervention Provide education on lifestyle modifcations including regular physical activity/exercise, weight management, moderate sodium restriction and increased consumption of fresh fruit, vegetables, and low fat dairy, alcohol moderation, and smoking cessation.;Monitor prescription use compliance.   Expected Outcomes Short Term: Continued assessment and intervention until BP is < 140/43m HG in hypertensive participants. < 130/834mHG in hypertensive participants with diabetes, heart failure or chronic kidney disease.;Long Term: Maintenance of blood pressure at goal levels.   Lipids Yes   Intervention Provide education and support for participant on  nutrition & aerobic/resistive exercise along with prescribed medications to achieve LDL '70mg'$ , HDL >'40mg'$ .   Expected Outcomes Long Term: Cholesterol controlled with medications as prescribed, with individualized exercise RX and with  personalized nutrition plan. Value goals: LDL < '70mg'$ , HDL > 40 mg.;Short Term: Participant states understanding of desired cholesterol values and is compliant with medications prescribed. Participant is following exercise prescription and nutrition guidelines.   Stress Yes   Intervention Offer individual and/or small group education and counseling on adjustment to heart disease, stress management and health-related lifestyle change. Teach and support self-help strategies.;Refer participants experiencing significant psychosocial distress to appropriate mental health specialists for further evaluation and treatment. When possible, include family members and significant others in education/counseling sessions.   Expected Outcomes Short Term: Participant demonstrates changes in health-related behavior, relaxation and other stress management skills, ability to obtain effective social support, and compliance with psychotropic medications if prescribed.;Long Term: Emotional wellbeing is indicated by absence of clinically significant psychosocial distress or social isolation.   Personal Goal Other Yes   Personal Goal short: increase stamina, be able to walk for 30 minutes without rest breaks or fatigue  long: get back to [place], improve confidence and know that heart is strong enough for activities/exercise   Intervention Provide education on cardiac disease, and provide exercise programming to improve cardiovascular fitness and confidence   Expected Outcomes Pt will be able to return to [facility] and be able to walk for 30 minutes without rest break or extreme fatigue.      Core Components/Risk Factors/Patient Goals Review:      Goals and Risk Factor Review    Row Name 01/20/16 1727 02/14/16 1520 03/17/16 1201 04/16/16 1019       Core Components/Risk Factors/Patient Goals Review   Personal Goals Review Other Other Increase Strength and Stamina;Other Other    Review Patient is walking 20-30 minutes  without rest breaks. Patient is walking and riding stationary bike 30 minutes without rest breaks. Participant feels stamina is a lot better than when she started. She feels she is "almost back to normal". Participant is able to walk 30 minutes continuously with little fatigue. Pt has been absent from [place] awaiting results from stress/echo test.  Will return to [facility] 04/17/16 with Dr. approval     Expected Outcomes Maintain walking program to increase cardiorespiratory fitness and being able to walk 30 minutes without rest breaks. Maintain walking program to increase cardiorespiratory fitness and being able to walk 30 minutes without rest breaks. Be able to walk 30 minutes without rest and without fatigue. Increase stength and stamina through aerobic and resistance training program. Continue to progress with exercise and develop exercie routine in order to maintain and achieve goals.        Core Components/Risk Factors/Patient Goals at Discharge (Final Review):      Goals and Risk Factor Review - 04/16/16 1019      Core Components/Risk Factors/Patient Goals Review   Personal Goals Review Other   Review Pt has been absent from [place] awaiting results from stress/echo test.  Will return to [facility] 04/17/16 with Dr. approval    Expected Outcomes Continue to progress with exercise and develop exercie routine in order to maintain and achieve goals.       ITP Comments:     ITP Comments    Row Name 01/09/16 1001 03/13/16 1638         ITP Comments Dr. Fransico Him, Medical Director attended hypertension education lecture, goals met  Comments: Lawsyn  is making expected progress toward personal goals after completing 34 sessions. Recommend continued exercise and life style modification education including  stress management and relaxation techniques to decrease cardiac risk profile. Kaidence was absent due to her EP evaluation and has been cleared by Dr Johnsie Cancel and Dr Curt Bears to return to exercise  tomorrow.Barnet Pall, RN,BSN 04/16/2016 10:34 AM

## 2016-04-16 NOTE — Telephone Encounter (Signed)
-----   Message from Will Meredith Leeds, MD sent at 04/16/2016  7:42 AM EST ----- Regarding: RE: cardiac rehab. Should be just fine to return to rehab at her next scheduled visit.  Thanks  Will [employer] ----- Message ----- From: Magda Kiel, RN Sent: 04/15/2016  10:27 AM To: Josue Hector, MD, Will Meredith Leeds, MD, # Subject: cardiac rehab.                                 Good morning Dr Johnsie Cancel and Dr Curt Bears  Is it okay for Caiya to resume cardiac rehab?  She has two more sessions to complete.   Thanks for your input!  Verdis Frederickson

## 2016-04-16 NOTE — Telephone Encounter (Signed)
-----   Message from Josue Hector, MD sent at 04/15/2016  3:25 PM EST ----- Regarding: RE: cardiac rehab. I think its ok given improved EF on echo and ETT being ok  ----- Message ----- From: Magda Kiel, RN Sent: 04/15/2016  10:27 AM To: Josue Hector, MD, Will Meredith Leeds, MD, # Subject: cardiac rehab.                                 Good morning Dr Johnsie Cancel and Dr Curt Bears  Is it okay for Deshundra to resume cardiac rehab?  She has two more sessions to complete.   Thanks for your input!  Verdis Frederickson

## 2016-04-17 ENCOUNTER — Ambulatory Visit (HOSPITAL_COMMUNITY): Payer: 59

## 2016-04-17 ENCOUNTER — Encounter (HOSPITAL_COMMUNITY): Payer: 59

## 2016-04-17 ENCOUNTER — Encounter (HOSPITAL_COMMUNITY)
Admission: RE | Admit: 2016-04-17 | Discharge: 2016-04-17 | Disposition: A | Discharge status: Home / Self Care | Payer: 59 | Source: Ambulatory Visit | Attending: Cardiovascular Disease | Admitting: Cardiovascular Disease

## 2016-04-17 DIAGNOSIS — I214 Non-ST elevation (NSTEMI) myocardial infarction: Secondary | ICD-10-CM | POA: Insufficient documentation

## 2016-04-17 DIAGNOSIS — Z955 Presence of coronary angioplasty implant and graft: Secondary | ICD-10-CM

## 2016-04-17 NOTE — Progress Notes (Signed)
Pt returned to cardiac rehab today.  Pt tolerated exercise well. Pt did have bigeminal PVC with exercise asymptomatic.  Pt BP soft: 92/60 post exercise, up to 97/61 after drinking water. Again asymptomatic.  Will continue to monitor.

## 2016-04-18 ENCOUNTER — Other Ambulatory Visit: Payer: Self-pay | Admitting: Cardiovascular Disease

## 2016-04-18 DIAGNOSIS — I2510 Atherosclerotic heart disease of native coronary artery without angina pectoris: Secondary | ICD-10-CM

## 2016-04-20 ENCOUNTER — Encounter (HOSPITAL_COMMUNITY): Payer: 59

## 2016-04-20 ENCOUNTER — Inpatient Hospital Stay (HOSPITAL_COMMUNITY): Admission: RE | Admit: 2016-04-20 | Payer: 59 | Source: Ambulatory Visit

## 2016-04-22 ENCOUNTER — Encounter (HOSPITAL_COMMUNITY): Payer: 59

## 2016-04-22 ENCOUNTER — Encounter (HOSPITAL_COMMUNITY)
Admission: RE | Admit: 2016-04-22 | Discharge: 2016-04-22 | Disposition: A | Discharge status: Home / Self Care | Payer: 59 | Source: Ambulatory Visit | Attending: Cardiovascular Disease | Admitting: Cardiovascular Disease

## 2016-04-22 DIAGNOSIS — Z955 Presence of coronary angioplasty implant and graft: Secondary | ICD-10-CM

## 2016-04-22 DIAGNOSIS — I214 Non-ST elevation (NSTEMI) myocardial infarction: Secondary | ICD-10-CM

## 2016-04-22 NOTE — Progress Notes (Signed)
Discharge Summary  Patient Details  Name: Brittany Mason MRN: [id number] Date of Birth: 12-28-1950 Referring Provider:   Flowsheet Row CARDIAC REHAB PHASE II ORIENTATION from 01/09/2016 in [place]  Referring Provider  Jenkins Rouge, MD       Number of Visits: 36  Reason for Discharge:  Patient independent in their exercise.  Smoking History:  History  Smoking Status  . Never Smoker  Smokeless Tobacco  . Never Used    Diagnosis:  11/19/15 V fib arrrest; NSTEMI (non-ST elevated myocardial infarction) ([place])  12/04/15 Status post coronary artery stent placement  ADL UCSD:   Initial Exercise Prescription:     Initial Exercise Prescription - 01/09/16 1300      Date of Initial Exercise RX and Referring Provider   Date 01/09/16   Referring Provider Jenkins Rouge, MD     Treadmill   MPH 2.6   Grade 1   Minutes 10   METs 3.35     Bike   Level 0.7   Minutes 10   METs 2.65     NuStep   Level 3   Minutes 10   METs 2     Prescription Details   Frequency (times per week) 3   Duration Progress to 30 minutes of continuous aerobic without signs/symptoms of physical distress     Intensity   THRR 40-80% of Max Heartrate 63-126   Ratings of Perceived Exertion 11-13   Perceived Dyspnea 0-4     Progression   Progression Continue to progress workloads to maintain intensity without signs/symptoms of physical distress.     Resistance Training   Training Prescription Yes   Weight 2lbs   Reps 10-12      Discharge Exercise Prescription (Final Exercise Prescription Changes):     Exercise Prescription Changes - 04/23/16 1500      Exercise Review   Progression Yes     Response to Exercise   Blood Pressure (Admit) 102/70   Blood Pressure (Exercise) 152/80   Blood Pressure (Exit) 102/60   Heart Rate (Admit) 64 bpm   Heart Rate (Exercise) 109 bpm   Heart Rate (Exit) 63 bpm   Rating of Perceived Exertion (Exercise) 12   Comments  Reviewed home exercise guidelines on 01/20/16.   Duration Progress to 30 minutes of continuous aerobic without signs/symptoms of physical distress   Intensity THRR unchanged     Progression   Progression Continue to progress workloads to maintain intensity without signs/symptoms of physical distress.   Average METs 4.5     Resistance Training   Training Prescription Yes   Weight 5LBS   Reps 10-12     Interval Training   Interval Training No     Treadmill   MPH 2.8   Grade 2   Minutes 10   METs 3.91     NuStep   Level 5   Minutes 10   METs 3.7     Adaptive Motion Trainer   Resistance 1   Grade 1   Minutes 10   METs 5.9     Home Exercise Plan   Plans to continue exercise at Home   Frequency Add 4 additional days to program exercise sessions.      Functional Capacity:     6 Minute Walk    Row Name 01/09/16 1342 03/30/16 1627       6 Minute Walk   Phase Initial Discharge    Distance 1603 feet 1893 feet  Distance % Change  - 18.09 %    Walk Time 6 minutes 6 minutes    # of Rest Breaks 0 0    MPH 3.03 3.59    METS 3.83 4.36    RPE 12 11    Perceived Dyspnea   - 0    VO2 Peak 13.41 15.26    Symptoms No No    Resting HR 67 bpm 59 bpm    Resting BP 104/60 100/62    Max Ex. HR 110 bpm 110 bpm    Max Ex. BP 142/78 142/82    2 Minute Post BP 100/60 102/67       Psychological, QOL, Others - Outcomes: PHQ 2/9: Depression screen [facility] 2/9 04/22/2016 04/22/2016  Decreased Interest 0 0  Down, Depressed, Hopeless 0 0  PHQ - 2 Score 0 0    Quality of Life:     Quality of Life - 04/23/16 1552      Quality of Life Scores   Health/Function Pre 23.67 %   Health/Function Post 23.36 %   Health/Function % Change -1.31 %   Socioeconomic Pre 27.57 %   Socioeconomic Post 26.43 %   Socioeconomic % Change  -4.13 %   Psych/Spiritual Pre 23.36 %   Psych/Spiritual Post 22.21 %   Psych/Spiritual % Change -4.92 %   Family Pre 28 %   Family Post 28 %   Family %  Change 0 %   GLOBAL Pre 24.86 %   GLOBAL Post 24.24 %   GLOBAL % Change -2.49 %      Personal Goals: Goals established at orientation with interventions provided to work toward goal.     Personal Goals and Risk Factors at Admission - 01/09/16 1004      Core Components/Risk Factors/Patient Goals on Admission    Weight Management Yes;Weight Loss   Intervention Weight Management: Develop a combined nutrition and exercise program designed to reach desired caloric intake, while maintaining appropriate intake of nutrient and fiber, sodium and fats, and appropriate energy expenditure required for the weight goal.;Weight Management: Provide education and appropriate resources to help participant work on and attain dietary goals.;Weight Management/Obesity: Establish reasonable short term and long term weight goals.   Expected Outcomes Short Term: Continue to assess and modify interventions until short term weight is achieved;Weight Maintenance: Understanding of the daily nutrition guidelines, which includes 25-35% calories from fat, 7% or less cal from saturated fats, less than 265m cholesterol, less than 1.5gm of sodium, & 5 or more servings of fruits and vegetables daily;Long Term: Adherence to nutrition and physical activity/exercise program aimed toward attainment of established weight goal;Weight Loss: Understanding of general recommendations for a balanced deficit meal plan, which promotes 1-2 lb weight loss per week and includes a negative energy balance of [phone] kcal/d;Understanding recommendations for meals to include 15-35% energy as protein, 25-35% energy from fat, 35-60% energy from carbohydrates, less than 2033mof dietary cholesterol, 20-35 gm of total fiber daily;Understanding of distribution of calorie intake throughout the day with the consumption of 4-5 meals/snacks   Increase Strength and Stamina Yes   Intervention Provide advice, education, support and counseling about physical  activity/exercise needs.;Develop an individualized exercise prescription for aerobic and resistive training based on initial evaluation findings, risk stratification, comorbidities and participant's personal goals.   Expected Outcomes Achievement of increased cardiorespiratory fitness and enhanced flexibility, muscular endurance and strength shown through measurements of functional capacity and personal statement of participant.   Improve shortness of breath with ADL's  Yes   Intervention Provide education, individualized exercise plan and daily activity instruction to help decrease symptoms of SOB with activities of daily living.   Expected Outcomes Short Term: Achieves a reduction of symptoms when performing activities of daily living.   Hypertension Yes   Intervention Provide education on lifestyle modifcations including regular physical activity/exercise, weight management, moderate sodium restriction and increased consumption of fresh fruit, vegetables, and low fat dairy, alcohol moderation, and smoking cessation.;Monitor prescription use compliance.   Expected Outcomes Short Term: Continued assessment and intervention until BP is < 140/52m HG in hypertensive participants. < 130/877mHG in hypertensive participants with diabetes, heart failure or chronic kidney disease.;Long Term: Maintenance of blood pressure at goal levels.   Lipids Yes   Intervention Provide education and support for participant on nutrition & aerobic/resistive exercise along with prescribed medications to achieve LDL <7083mHDL >81m49m Expected Outcomes Long Term: Cholesterol controlled with medications as prescribed, with individualized exercise RX and with personalized nutrition plan. Value goals: LDL < 70mg30mL > 40 mg.;Short Term: Participant states understanding of desired cholesterol values and is compliant with medications prescribed. Participant is following exercise prescription and nutrition guidelines.   Stress Yes    Intervention Offer individual and/or small group education and counseling on adjustment to heart disease, stress management and health-related lifestyle change. Teach and support self-help strategies.;Refer participants experiencing significant psychosocial distress to appropriate mental health specialists for further evaluation and treatment. When possible, include family members and significant others in education/counseling sessions.   Expected Outcomes Short Term: Participant demonstrates changes in health-related behavior, relaxation and other stress management skills, ability to obtain effective social support, and compliance with psychotropic medications if prescribed.;Long Term: Emotional wellbeing is indicated by absence of clinically significant psychosocial distress or social isolation.   Personal Goal Other Yes   Personal Goal short: increase stamina, be able to walk for 30 minutes without rest breaks or fatigue  long: get back to [place] confidence and know that heart is strong enough for activities/exercise   Intervention Provide education on cardiac disease, and provide exercise programming to improve cardiovascular fitness and confidence   Expected Outcomes [facility] without [facility] break or extreme fatigue.       Personal Goals Discharge:     Goals and Risk Factor Review    Row Name 01/20/16 1727 02/14/16 1520 03/17/16 1201 04/16/16 1019 04/23/16 1549     Core Components/Risk Factors/Patient Goals Review   Personal Goals Review Other Other Increase Strength and Stamina;Other Other Other   Review Patient is walking 20-30 minutes without rest breaks. Patient is walking and riding stationary bike 30 minutes without rest breaks. Participant feels stamina is a lot better than when she started. She feels she is "almost back to normal". Participant is able to walk 30 minutes continuously with little fatigue. Pt [facility]  [facility]echo test.  [facility]  Caylie Hisaemade great progress during her time in cardiac rehab. She achieved her goal of increasing her stamina and feeling more confident when exercising.     Expected Outcomes Maintain walking program to increase cardiorespiratory fitness and being able to walk 30 minutes without rest breaks. Maintain walking program to increase cardiorespiratory fitness and being able to walk 30 minutes without rest breaks. Be able to walk 30 minutes without rest and without fatigue. Increase stength  and stamina through aerobic and resistance training program. Continue to progress with exercise and develop exercie routine in order to maintain and achieve goals.  Continue to exercise five days a week and continue to improve cardiovascular fitness.     [place] Name 04/27/16 1341             Core Components/Risk Factors/Patient Goals Review   Personal Goals Review Weight Management/Obesity       Review Pt has maintained her wt while in Cardiac Rehab.       Expected Outcomes Continue to follow a heart healthy diet, exercise most days a of the week and work toward wt loss goal.          Nutrition & Weight - Outcomes:     Pre Biometrics - 01/13/16 1645      Pre Biometrics   Height 5' 3.5" (1.613 m)   Weight 174 lb 9.7 oz (79.2 kg)   Waist Circumference 41 inches   Hip Circumference 45 inches   Waist to Hip Ratio 0.91 %   BMI (Calculated) 30.5   Triceps Skinfold 28 mm   % Body Fat 42.3 %   Grip Strength 33 kg   Flexibility 13 in   Single Leg Stand 30 seconds         Post Biometrics - 03/30/16 1629       Post  Biometrics   Height 5' 3.8" (1.621 m)   Weight 174 lb 2.6 oz (79 kg)   Waist Circumference 39.5 inches   Hip Circumference 45 inches   Waist to Hip Ratio 0.88 %   BMI (Calculated) 30.1   Triceps Skinfold 22 mm   % Body Fat 40.2 %   Grip Strength 40 kg   Flexibility 17.5 in    Single Leg Stand 30 seconds      Nutrition:     Nutrition Therapy & Goals - 01/10/16 1403      Nutrition Therapy   Diet Therapeutic Lifestyle Changes     Personal Nutrition Goals   Personal Goal #1 1-2 lb wt loss/week to a wt loss goal of 6-24 lb at graduation from [place], educate and counsel regarding individualized specific dietary modifications aiming towards targeted core components such as weight, hypertension, lipid management, diabetes, heart failure and other comorbidities.   Expected Outcomes Short Term Goal: Understand basic principles of dietary content, such as calories, fat, sodium, cholesterol and nutrients.;Long Term Goal: Adherence to prescribed nutrition plan.      Nutrition Discharge:     Nutrition Assessments - 04/27/16 1339      MEDFICTS Scores   Pre Score 33   Post Score 15   Score Difference -18      Education Questionnaire Score:     Knowledge Questionnaire Score - 04/23/16 1546      Knowledge Questionnaire Score   Post Score 24/24      Goals reviewed with patient; copy given to patient.Pt graduated from cardiac rehab program today with completion of 36 exercise sessions in Phase II. Pt maintained good attendance and progressed nicely during his participation in rehab as evidenced by increased MET level.   Medication list reconciled. Repeat  PHQ score-0  .  Pt has made significant lifestyle changes and should be commended for his success. Pt feels she has achieved his goals during cardiac rehab.   Pt plans to continue exercise in cardiac maintenance program. Brittany Mason reported that she experienced some  chest discomfort on Sunday and took a sublingual nitroglycerin with relief. Brittany Mason said the pain was across her upper chest and radiated into her left jaw. Brittany Mason [facility] called and notified. Brittany Mason told Brittany Mason to keep her appointment with Dr Curt Bears on December 4th and to call if she has any more  chest pain. Patient states understanding.Harrell Gave RN BSN

## 2016-04-23 MED FILL — ESCITALOPRAM 20 MG TABLET: 20 | 90 days supply | Qty: 90 | Fill #1

## 2016-04-24 ENCOUNTER — Encounter (HOSPITAL_COMMUNITY): Payer: 59

## 2016-04-27 ENCOUNTER — Encounter (HOSPITAL_COMMUNITY): Payer: 59

## 2016-04-29 ENCOUNTER — Encounter: Payer: Self-pay | Admitting: Cardiology

## 2016-04-29 ENCOUNTER — Encounter (HOSPITAL_COMMUNITY): Payer: 59

## 2016-04-29 MED FILL — MORPHINE SULF ER 30 MG TAB: 30 | 30 days supply | Qty: 60 | Fill #0

## 2016-05-01 ENCOUNTER — Encounter (HOSPITAL_COMMUNITY): Payer: 59

## 2016-05-04 ENCOUNTER — Encounter (HOSPITAL_COMMUNITY): Payer: 59

## 2016-05-04 ENCOUNTER — Encounter (HOSPITAL_COMMUNITY)
Admission: RE | Admit: 2016-05-04 | Discharge: 2016-05-04 | Disposition: A | Discharge status: Home / Self Care | Payer: Self-pay | Source: Ambulatory Visit | Attending: Cardiovascular Disease | Admitting: Cardiovascular Disease

## 2016-05-06 ENCOUNTER — Encounter (HOSPITAL_COMMUNITY): Payer: 59

## 2016-05-06 ENCOUNTER — Encounter (HOSPITAL_COMMUNITY): Payer: Self-pay

## 2016-05-06 ENCOUNTER — Encounter (HOSPITAL_COMMUNITY)
Admission: RE | Admit: 2016-05-06 | Discharge: 2016-05-06 | Disposition: A | Discharge status: Home / Self Care | Payer: Self-pay | Source: Ambulatory Visit | Attending: Cardiovascular Disease | Admitting: Cardiovascular Disease

## 2016-05-08 ENCOUNTER — Encounter (HOSPITAL_COMMUNITY)
Admission: RE | Admit: 2016-05-08 | Discharge: 2016-05-08 | Disposition: A | Discharge status: Home / Self Care | Payer: Self-pay | Source: Ambulatory Visit | Attending: Cardiovascular Disease | Admitting: Cardiovascular Disease

## 2016-05-08 ENCOUNTER — Encounter (HOSPITAL_COMMUNITY): Payer: 59

## 2016-05-08 ENCOUNTER — Encounter (HOSPITAL_COMMUNITY): Payer: Self-pay

## 2016-05-08 DIAGNOSIS — I2510 Atherosclerotic heart disease of native coronary artery without angina pectoris: Secondary | ICD-10-CM | POA: Insufficient documentation

## 2016-05-11 ENCOUNTER — Encounter: Payer: Self-pay | Admitting: Cardiology

## 2016-05-11 ENCOUNTER — Encounter (HOSPITAL_COMMUNITY): Payer: Self-pay

## 2016-05-11 ENCOUNTER — Encounter (HOSPITAL_COMMUNITY)
Admission: RE | Admit: 2016-05-11 | Discharge: 2016-05-11 | Disposition: A | Discharge status: Home / Self Care | Payer: Self-pay | Source: Ambulatory Visit | Attending: Cardiovascular Disease | Admitting: Cardiovascular Disease

## 2016-05-11 ENCOUNTER — Encounter (HOSPITAL_COMMUNITY): Payer: 59

## 2016-05-11 ENCOUNTER — Ambulatory Visit (INDEPENDENT_AMBULATORY_CARE_PROVIDER_SITE_OTHER): Payer: 59 | Admitting: Cardiology

## 2016-05-11 VITALS — BP 114/68 | HR 56 | Ht 63.0 in | Wt 175.2 lb

## 2016-05-11 DIAGNOSIS — I255 Ischemic cardiomyopathy: Secondary | ICD-10-CM

## 2016-05-11 NOTE — Patient Instructions (Signed)
Medication Instructions:  Your physician recommends that you continue on your current medications as directed. Please refer to the Current Medication list given to you today.  * If you need a refill on your cardiac medications before your next appointment, please call your pharmacy.   Labwork: None ordered  Testing/Procedures: None ordered  Follow-Up: Your physician wants you to follow-up in: 1 year with Dr. Camnitz.  You will receive a reminder letter in the mail two months in advance. If you don't receive a letter, please call our office to schedule the follow-up appointment.  Thank you for choosing [facility]!!   Lakayla Barrington, RN [phone]        

## 2016-05-11 NOTE — Progress Notes (Signed)
Electrophysiology Office Note   Date:  05/11/2016   ID:  Brittany Mason, DOB 1950-08-10, MRN [id number]  PCP:  Gennette Pac, MD  Cardiologist:  Johnsie Cancel Primary Electrophysiologist:  Will Meredith Leeds, MD    Chief Complaint  Patient presents with  . Follow-up    PVC's     History of Present Illness: Brittany Mason is a 65 y.o. female who presents today for electrophysiology evaluation.   Hx PE, HLD, MVP, MI with cardiac arrest with 16 minutes down time.  Had totally occluded circumflex. Had stent placed in proximal circumflex with distal collaterals.   Found to have PVCs at cardiac rehab. Started on low dose beta blocker. She had a exercise treadmill test that showed no evidence of sustained ventricular arrhythmias. Her echocardiogram showed an improvement in her ejection fraction to 45-50%.  Today, she denies symptoms of palpitations, chest pain, shortness of breath, orthopnea, PND, lower extremity edema, claudication, dizziness, presyncope, syncope, bleeding, or neurologic sequela. The patient is tolerating medications without difficulties and is otherwise without complaint today.  She is doing well with cardiac rehabilitation not having any major issues. She is currently working with cardiac rehabilitation not in a monitored setting. She has had one episode of chest pain which required nitroglycerin.   Past Medical History:  Diagnosis Date  . Acute pulmonary embolism ([place]) 06/20/2015  . Arthritis    osteoarthritis-knees. Chronic back pain  . Basal cell carcinoma of right shoulder    "burned off"  . Chronic SI joint pain   . DVT (deep venous thrombosis) ([place]) 04/2013   left lower leg, resulted in Pulmonary emboli on hormone therapy  . Dyspnea on exertion   . Hypercholesterolemia   . Hypothyroidism   . Migraine    "< once/month" (12/04/2015)  . MVP (mitral valve prolapse)    asymptomatic  . Myocardial infarction 11/19/2015   cardiac arrest  . PE (pulmonary  embolism) Apr 18, 2013   tx. -using Xarelto now, no further lung problems, denies SOB on 01-02-14  . PONV (postoperative nausea and vomiting)   . RLS (restless legs syndrome)    Past Surgical History:  Procedure Laterality Date  . ANTERIOR CRUCIATE LIGAMENT REPAIR Left 1976; 1981   "open"  . CARDIAC CATHETERIZATION N/A 11/20/2015   Procedure: Left Heart Cath and Coronary Angiography;  Surgeon: Belva Crome, MD;  Location: [facility];  Service: Cardiovascular;  Laterality: N/A;  . CARDIAC CATHETERIZATION N/A 12/04/2015   Procedure: Left Heart Cath and Coronary Angiography;  Surgeon: Peter M Martinique, MD;  Location: [facility];  Service: Cardiovascular;  Laterality: N/A;  . CARDIAC CATHETERIZATION  12/04/2015   Procedure: Coronary Stent Intervention;  Surgeon: Peter M Martinique, MD;  Location: [facility];  Service: Cardiovascular;;  . COLONOSCOPY  09/28/2011   Procedure: COLONOSCOPY;  Surgeon: Juanita Craver, MD;  Location: [facility];  Service: Endoscopy;  Laterality: N/A;  . JOINT REPLACEMENT    . TOTAL ABDOMINAL HYSTERECTOMY  1998  . TOTAL KNEE ARTHROPLASTY Left 01/08/2014   Procedure: LEFT TOTAL KNEE ARTHROPLASTY;  Surgeon: Gearlean Alf, MD;  Location: [facility];  Service: Orthopedics;  Laterality: Left;     Current Outpatient Prescriptions  Medication Sig Dispense Refill  . aspirin 81 MG tablet Take 81 mg by mouth daily.    . butalbital-acetaminophen-caffeine (FIORICET) 50-325-40 MG tablet Take 1-2 tablets by mouth every 6 (six) hours as needed for headache. 20 tablet 0  . clopidogrel (PLAVIX) 75 MG tablet  Take 1 tablet (75 mg total) by mouth daily. 90 tablet 3  . cyclobenzaprine (FLEXERIL) 5 MG tablet Take 5 mg by mouth 3 (three) times daily as needed for muscle spasms.    Marland Kitchen escitalopram (LEXAPRO) 20 MG tablet Take 20 mg by mouth every morning.     . fenofibrate 160 MG tablet Take 160 mg by mouth daily. Takes at bedtime    . levothyroxine (SYNTHROID, LEVOTHROID) 150  MCG tablet Take 150 mcg by mouth daily before breakfast.     . metoprolol tartrate (LOPRESSOR) 25 MG tablet Take 0.5 tablets (12.5 mg total) by mouth 2 (two) times daily. 180 tablet 3  . morphine (MSIR) 30 MG tablet Take 30 mg by mouth every 12 (twelve) hours.    . nitroGLYCERIN (NITROSTAT) 0.4 MG SL tablet Place 1 tablet (0.4 mg total) under the tongue every 5 (five) minutes as needed for chest pain. 25 tablet 3  . omega-3 acid ethyl esters (LOVAZA) 1 G capsule Take 2 g by mouth 2 (two) times daily.    Marland Kitchen OVER THE COUNTER MEDICATION Take 2 capsules by mouth daily. "Relizen" herbal supplement for hormone replacement     . ranitidine (ZANTAC) 150 MG tablet Take 150 mg by mouth daily.    Marland Kitchen rOPINIRole (REQUIP) 0.5 MG tablet Take 0.5 mg by mouth every evening.     . simvastatin (ZOCOR) 20 MG tablet Take 1 tablet (20 mg total) by mouth at bedtime. 30 tablet 11   No current facility-administered medications for this visit.     Allergies:   Codeine and Erythromycin   Social History:  The patient  reports that she has never smoked. She has never used smokeless tobacco. She reports that she drinks alcohol. She reports that she does not use drugs.   Family History:  [name]  family history includes Breast cancer in her maternal aunt and paternal aunt; Cancer in her maternal aunt; Congestive Heart Failure in her mother; Coronary artery disease in her father; Hypertension in her father and mother; Stroke in her brother.    ROS:  Please see the history of present illness.   Otherwise, review of systems is positive for back pain.   All other systems are reviewed and negative.    PHYSICAL EXAM: VS:  BP 114/68   Pulse (!) 56   Ht 5\' 3"  (1.6 m)   Wt 175 lb 3.2 oz (79.5 kg)   BMI 31.04 kg/m  , BMI Body mass index is 31.04 kg/m. GEN: Well nourished, well developed, in no acute distress  HEENT: normal  Neck: no JVD, carotid bruits, or masses Cardiac: bradycardic, regular; no murmurs, rubs, or  gallops,no edema  Respiratory:  clear to auscultation bilaterally, normal work of breathing GI: soft, nontender, nondistended, + BS MS: no deformity or atrophy  Skin: warm and dry Neuro:  Strength and sensation are intact Psych: euthymic mood, full affect  EKG:  EKG is ordered today. Personal review of the ekg ordered shows sinus rhythm, rate 49  Recent Labs: 12/02/2015: ALT 58; Brain Natriuretic Peptide 170.6 12/05/2015: Hemoglobin 11.2; Platelets 527 01/20/2016: BUN 17; Creat 0.84; Magnesium 2.0; Potassium 4.3; Sodium 141; TSH 0.39    Lipid Panel     Component Value Date/Time   CHOL 181 11/19/2015 2036   TRIG 186 (H) 11/21/2015 0300   HDL 66 11/19/2015 2036   CHOLHDL 2.7 11/19/2015 2036   VLDL 21 11/19/2015 2036   LDLCALC 94 11/19/2015 2036     Wt Readings from  Last 3 Encounters:  05/11/16 175 lb 3.2 oz (79.5 kg)  04/02/16 174 lb 12.8 oz (79.3 kg)  03/30/16 174 lb 2.6 oz (79 kg)      Other studies Reviewed: Additional studies/ records that were reviewed today include: Cath 12/04/15, TTE 04/08/16  Review of the above records today demonstrates:   Prox RCA to Mid RCA lesion, 30% stenosed.  RPDA lesion, 60% stenosed.  Prox Cx to Mid Cx lesion, 100% stenosed.  There is mild left ventricular systolic dysfunction.  Prox Cx lesion, 99% stenosed. Post intervention, there is a 0% residual stenosis.   1. Single vessel obstructive CAD 2. Mild LV dysfunction. 3. Low LVEDP 4. Successful stenting of the proximal LCx into a large bifurcating OM1. The distal LCx is still occluded with right to left collaterals. This branch is relatively small.   - Left ventricle: The cavity size was normal. Systolic function was   mildly reduced. The estimated ejection fraction was in the range   of 45% to 50%. Moderate hypokinesis of the   mid-apicalanterolateral and inferolateral myocardium; consistent   with infarction in the distribution of the left circumflex   coronary artery. Doppler  parameters are consistent with abnormal   left ventricular relaxation (grade 1 diastolic dysfunction). - Aortic valve: There was trivial regurgitation. - Atrial septum: No defect or patent foramen ovale was identified.  ASSESSMENT AND PLAN: 1. V-fib Cardiac Arrest/ NSTEMI - with LV dysfunction -Cath on 6/14 showed total occlusion of prox circumflex. S/p PCI to the LCX note indicated distal circumflex still occluded with collaterals.    2. Ischemic CM - Improved echo on repeat. No changes to current medications. No indication for defibrillator at this time.  3. Hyperlipidemia: on Zocor and CoQ 10  4. PVCs: Had PVCs with exercise but no evidence of sustained ventricular tachycardia. She is not having any symptoms when she exercises. She is able to resume without resection.  Current medicines are reviewed at length with the patient today.   The patient does not have concerns regarding her medicines.  The following changes were made today:  none  Labs/ tests ordered today include: No orders of the defined types were placed in this encounter.    Disposition:   FU with Will Camnitz 6 weeks  Signed, Will Meredith Leeds, MD  05/11/2016 10:52 AM     [facility] [address] [place] [address] [phone] (office) [phone] (fax)

## 2016-05-13 ENCOUNTER — Encounter (HOSPITAL_COMMUNITY): Payer: 59

## 2016-05-13 ENCOUNTER — Encounter (HOSPITAL_COMMUNITY)
Admission: RE | Admit: 2016-05-13 | Discharge: 2016-05-13 | Disposition: A | Discharge status: Home / Self Care | Payer: Self-pay | Source: Ambulatory Visit | Attending: Cardiovascular Disease | Admitting: Cardiovascular Disease

## 2016-05-13 ENCOUNTER — Encounter (HOSPITAL_COMMUNITY): Payer: Self-pay

## 2016-05-15 ENCOUNTER — Encounter (HOSPITAL_COMMUNITY): Payer: Self-pay

## 2016-05-18 ENCOUNTER — Encounter (HOSPITAL_COMMUNITY)
Admission: RE | Admit: 2016-05-18 | Discharge: 2016-05-18 | Disposition: A | Discharge status: Home / Self Care | Payer: Self-pay | Source: Ambulatory Visit | Attending: Cardiovascular Disease | Admitting: Cardiovascular Disease

## 2016-05-18 ENCOUNTER — Encounter (HOSPITAL_COMMUNITY): Payer: Self-pay

## 2016-05-20 ENCOUNTER — Encounter (HOSPITAL_COMMUNITY)
Admission: RE | Admit: 2016-05-20 | Discharge: 2016-05-20 | Disposition: A | Discharge status: Home / Self Care | Payer: Self-pay | Source: Ambulatory Visit | Attending: Cardiovascular Disease | Admitting: Cardiovascular Disease

## 2016-05-20 ENCOUNTER — Encounter (HOSPITAL_COMMUNITY): Payer: Self-pay

## 2016-05-22 ENCOUNTER — Encounter (HOSPITAL_COMMUNITY): Payer: Self-pay

## 2016-05-25 ENCOUNTER — Encounter (HOSPITAL_COMMUNITY): Payer: Self-pay

## 2016-05-25 ENCOUNTER — Encounter (HOSPITAL_COMMUNITY)
Admission: RE | Admit: 2016-05-25 | Discharge: 2016-05-25 | Disposition: A | Discharge status: Home / Self Care | Payer: Self-pay | Source: Ambulatory Visit | Attending: Cardiovascular Disease | Admitting: Cardiovascular Disease

## 2016-05-27 ENCOUNTER — Encounter (HOSPITAL_COMMUNITY): Payer: Self-pay

## 2016-05-29 ENCOUNTER — Encounter (HOSPITAL_COMMUNITY)
Admission: RE | Admit: 2016-05-29 | Discharge: 2016-05-29 | Disposition: A | Discharge status: Home / Self Care | Payer: Self-pay | Source: Ambulatory Visit | Attending: Cardiovascular Disease | Admitting: Cardiovascular Disease

## 2016-05-29 ENCOUNTER — Encounter (HOSPITAL_COMMUNITY): Payer: Self-pay

## 2016-06-02 MED FILL — OMEGA-3 ETHYL ESTERS 1 GM C: 1 | 90 days supply | Qty: 360 | Fill #2 | Status: TO

## 2016-06-02 MED FILL — CLOPIDOGREL 75 MG TABLET: 75 | 90 days supply | Qty: 90 | Fill #2

## 2016-06-02 MED FILL — SIMVASTATIN 20 MG TABLET: 20 | 90 days supply | Qty: 90 | Fill #1

## 2016-06-03 ENCOUNTER — Encounter (HOSPITAL_COMMUNITY): Payer: Self-pay

## 2016-06-03 ENCOUNTER — Encounter (HOSPITAL_COMMUNITY)
Admission: RE | Admit: 2016-06-03 | Discharge: 2016-06-03 | Disposition: A | Discharge status: Home / Self Care | Payer: Self-pay | Source: Ambulatory Visit | Attending: Cardiovascular Disease | Admitting: Cardiovascular Disease

## 2016-06-03 ENCOUNTER — Other Ambulatory Visit (HOSPITAL_COMMUNITY): Payer: 59

## 2016-06-03 ENCOUNTER — Other Ambulatory Visit: Payer: 59 | Admitting: *Deleted

## 2016-06-03 DIAGNOSIS — Z79899 Other long term (current) drug therapy: Secondary | ICD-10-CM | POA: Diagnosis not present

## 2016-06-03 DIAGNOSIS — I255 Ischemic cardiomyopathy: Secondary | ICD-10-CM | POA: Diagnosis not present

## 2016-06-03 LAB — HEPATIC FUNCTION PANEL
ALT: 14 U/L (ref 6–29)
AST: 24 U/L (ref 10–35)
Albumin: 4.4 g/dL (ref 3.6–5.1)
Alkaline Phosphatase: 33 U/L (ref 33–130)
BILIRUBIN DIRECT: 0.1 mg/dL (ref <=0.2)
Indirect Bilirubin: 0.3 mg/dL (ref 0.2–1.2)
TOTAL PROTEIN: 7.2 g/dL (ref 6.1–8.1)
Total Bilirubin: 0.4 mg/dL (ref 0.2–1.2)

## 2016-06-03 LAB — LIPID PANEL
CHOL/HDL RATIO: 3 ratio (ref <5.0)
CHOLESTEROL: 202 mg/dL — AB (ref <200)
HDL: 68 mg/dL (ref >50)
LDL CALC: 116 mg/dL — AB (ref <100)
Triglycerides: 89 mg/dL (ref <150)
VLDL: 18 mg/dL (ref <30)

## 2016-06-03 MED FILL — NITROGLYCERIN 0.4 MG TAB SL: 0.4 | 13 days supply | Qty: 25 | Fill #1

## 2016-06-03 MED FILL — MORPHINE SULF ER 30 MG TAB: 30 | 30 days supply | Qty: 60 | Fill #0

## 2016-06-05 ENCOUNTER — Encounter (HOSPITAL_COMMUNITY)
Admission: RE | Admit: 2016-06-05 | Discharge: 2016-06-05 | Disposition: A | Discharge status: Home / Self Care | Payer: Self-pay | Source: Ambulatory Visit | Attending: Cardiovascular Disease | Admitting: Cardiovascular Disease

## 2016-06-05 ENCOUNTER — Encounter (HOSPITAL_COMMUNITY): Payer: Self-pay

## 2016-06-08 ENCOUNTER — Encounter (HOSPITAL_COMMUNITY): Payer: Self-pay

## 2016-06-10 ENCOUNTER — Telehealth: Payer: Self-pay

## 2016-06-10 ENCOUNTER — Encounter (HOSPITAL_COMMUNITY): Payer: Self-pay

## 2016-06-10 MED ORDER — SIMVASTATIN 20 MG PO TABS: ORAL_TABLET | ORAL | 11 refills | Status: DC

## 2016-06-10 NOTE — Telephone Encounter (Signed)
Called patient about Dr. Kyla Balzarine message. Per Dr. Johnsie Cancel, Increase zocor to 40 mg alternating with 20 mg and f/u with lipid clinic for labs in 3 months. Patient verbalized understanding. Will send in new prescription and have scheduling to make an appointment with lipid clinic.  Reviewed with Fuller Canada the pharmacist. Patient will follow-up with lipid clinic in 3 months, unless she is not tolerating higher dose of Zocor, then she can see the lipid clinic sooner. Will send message to [facility] to schedule.

## 2016-06-10 NOTE — Telephone Encounter (Signed)
-----   Message from Josue Hector, MD sent at 06/10/2016  8:54 AM EST ----- Increase zocor to 40 mg alternating with 20 mg and f/u with lipid clinic for labs in 3 months

## 2016-06-12 ENCOUNTER — Encounter (HOSPITAL_COMMUNITY): Payer: Self-pay

## 2016-06-12 DIAGNOSIS — E039 Hypothyroidism, unspecified: Secondary | ICD-10-CM | POA: Diagnosis not present

## 2016-06-12 DIAGNOSIS — I252 Old myocardial infarction: Secondary | ICD-10-CM | POA: Diagnosis not present

## 2016-06-12 DIAGNOSIS — E782 Mixed hyperlipidemia: Secondary | ICD-10-CM | POA: Diagnosis not present

## 2016-06-12 DIAGNOSIS — Z79899 Other long term (current) drug therapy: Secondary | ICD-10-CM | POA: Diagnosis not present

## 2016-06-12 MED ORDER — SIMVASTATIN 40 MG PO TABS: 40.0000 mg | ORAL_TABLET | ORAL | 3 refills | Status: DC

## 2016-06-12 MED ORDER — SIMVASTATIN 20 MG PO TABS: 20.0000 mg | ORAL_TABLET | ORAL | 3 refills | Status: DC

## 2016-06-12 MED FILL — SIMVASTATIN 20 MG TABLET: 20 | 90 days supply | Qty: 45 | Fill #0

## 2016-06-12 MED FILL — SIMVASTATIN 40 MG TABLET: 40 | 90 days supply | Qty: 45 | Fill #0

## 2016-06-12 NOTE — Addendum Note (Signed)
Addended by: Aris Georgia, Kiyana Vazguez L on: 06/12/2016 03:04 PM   Modules accepted: Orders

## 2016-06-15 ENCOUNTER — Encounter (HOSPITAL_COMMUNITY): Payer: Self-pay

## 2016-06-17 ENCOUNTER — Encounter (HOSPITAL_COMMUNITY): Payer: Self-pay

## 2016-06-19 ENCOUNTER — Encounter (HOSPITAL_COMMUNITY): Payer: Self-pay

## 2016-06-19 MED FILL — rOPINIRole HCL 0.5 MG TABS: 0.5 | 90 days supply | Qty: 90 | Fill #3 | Status: TO

## 2016-06-19 MED FILL — SYNTHROID 150 MCG TABLET: 150 | 90 days supply | Qty: 90 | Fill #2

## 2016-06-22 ENCOUNTER — Encounter (HOSPITAL_COMMUNITY): Payer: Self-pay

## 2016-06-24 ENCOUNTER — Encounter (HOSPITAL_COMMUNITY): Payer: Self-pay

## 2016-06-26 ENCOUNTER — Encounter (HOSPITAL_COMMUNITY): Payer: Self-pay

## 2016-06-29 ENCOUNTER — Encounter (HOSPITAL_COMMUNITY): Payer: Self-pay

## 2016-07-01 ENCOUNTER — Encounter (HOSPITAL_COMMUNITY): Payer: Self-pay

## 2016-07-03 ENCOUNTER — Encounter (HOSPITAL_COMMUNITY): Payer: Self-pay

## 2016-07-06 ENCOUNTER — Encounter (HOSPITAL_COMMUNITY): Payer: Self-pay

## 2016-07-08 ENCOUNTER — Encounter (HOSPITAL_COMMUNITY): Payer: Self-pay

## 2016-07-09 MED FILL — ESCITALOPRAM 20 MG TABLET: 20 | 90 days supply | Qty: 90 | Fill #2 | Status: TO

## 2016-07-09 MED FILL — METOPROLOL TARTRATE 25 MG T: 25 | 90 days supply | Qty: 90 | Fill #2

## 2016-07-09 MED FILL — FENOFIBRATE 160 MG TABLET: 160 | 90 days supply | Qty: 90 | Fill #3 | Status: TO

## 2016-07-10 ENCOUNTER — Encounter (HOSPITAL_COMMUNITY): Payer: Self-pay

## 2016-07-12 ENCOUNTER — Ambulatory Visit (HOSPITAL_COMMUNITY)
Admission: EM | Admit: 2016-07-12 | Discharge: 2016-07-12 | Disposition: A | Discharge status: Home / Self Care | Payer: 59 | Attending: Family Medicine | Admitting: Family Medicine

## 2016-07-12 ENCOUNTER — Encounter (HOSPITAL_COMMUNITY): Payer: Self-pay | Admitting: Emergency Medicine

## 2016-07-12 DIAGNOSIS — R42 Dizziness and giddiness: Secondary | ICD-10-CM | POA: Diagnosis not present

## 2016-07-12 MED ORDER — MECLIZINE HCL 12.5 MG PO TABS: 12.5000 mg | ORAL_TABLET | Freq: Three times a day (TID) | ORAL | 0 refills | Status: DC | PRN

## 2016-07-12 NOTE — ED Provider Notes (Signed)
CSN: [id number]     Arrival date & time 07/12/16  1406 History   First MD Initiated Contact with Patient 07/12/16 1511     Chief Complaint  Patient presents with  . Dizziness   (Consider location/radiation/quality/duration/timing/severity/associated sxs/prior Treatment) 66 year old patient presents to clinic with chief complaint of dizziness starting this morning. Dizziness is worse when she reports moving or turning of her head, she denies chest pain,, nausea, vomiting, diarrhea, decreased fluid intake, sinus pain or pressure, palpitations, or other symptoms. She has low heart rate and Bp, states that these readings are normal for her, takes a beta blocker for prior MI.   The history is provided by the patient.  Dizziness    Past Medical History:  Diagnosis Date  . Acute pulmonary embolism ([place]) 06/20/2015  . Arthritis    osteoarthritis-knees. Chronic back pain  . Basal cell carcinoma of right shoulder    "burned off"  . Chronic SI joint pain   . DVT (deep venous thrombosis) ([place]) 04/2013   left lower leg, resulted in Pulmonary emboli on hormone therapy  . Dyspnea on exertion   . Hypercholesterolemia   . Hypothyroidism   . Migraine    "< once/month" (12/04/2015)  . MVP (mitral valve prolapse)    asymptomatic  . Myocardial infarction 11/19/2015   cardiac arrest  . PE (pulmonary embolism) Apr 18, 2013   tx. -using Xarelto now, no further lung problems, denies SOB on 01-02-14  . PONV (postoperative nausea and vomiting)   . RLS (restless legs syndrome)    Past Surgical History:  Procedure Laterality Date  . ANTERIOR CRUCIATE LIGAMENT REPAIR Left 1976; 1981   "open"  . CARDIAC CATHETERIZATION N/A 11/20/2015   Procedure: Left Heart Cath and Coronary Angiography;  Surgeon: Belva Crome, MD;  Location: [facility];  Service: Cardiovascular;  Laterality: N/A;  . CARDIAC CATHETERIZATION N/A 12/04/2015   Procedure: Left Heart Cath and Coronary Angiography;  Surgeon: Peter M  Martinique, MD;  Location: [facility];  Service: Cardiovascular;  Laterality: N/A;  . CARDIAC CATHETERIZATION  12/04/2015   Procedure: Coronary Stent Intervention;  Surgeon: Peter M Martinique, MD;  Location: [facility];  Service: Cardiovascular;;  . COLONOSCOPY  09/28/2011   Procedure: COLONOSCOPY;  Surgeon: Juanita Craver, MD;  [facility];  Service: Endoscopy;  Laterality: N/A;  . JOINT REPLACEMENT    . TOTAL ABDOMINAL HYSTERECTOMY  1998  . TOTAL KNEE ARTHROPLASTY Left 01/08/2014   Procedure: LEFT TOTAL KNEE ARTHROPLASTY;  Surgeon: Gearlean Alf, MD;  Location: [facility];  Service: Orthopedics;  Laterality: Left;   Family History  Problem Relation Age of Onset  . Hypertension Mother   . Congestive Heart Failure Mother   . Coronary artery disease Father   . Hypertension Father   . Stroke Brother     DIED AT 68  . Cancer Maternal Aunt     lung, colon  . Breast cancer Maternal Aunt   . Breast cancer Paternal Aunt    Social History  Substance Use Topics  . Smoking status: Never Smoker  . Smokeless tobacco: Never Used  . Alcohol use Yes     Comment: 12/04/2015 2-3 times a year, one drink at at time   OB History    Gravida Para Term Preterm AB Living   0             SAB TAB Ectopic Multiple Live Births  Review of Systems  Reason unable to perform ROS: as covered in HPI.  Neurological: Positive for dizziness.  All other systems reviewed and are negative.   Allergies  Codeine and Erythromycin  Home Medications   Prior to Admission medications   Medication Sig Start Date End Date Taking? Authorizing Provider  aspirin 81 MG tablet Take 81 mg by mouth daily.    Historical Provider, MD  butalbital-acetaminophen-caffeine (FIORICET) 50-325-40 MG tablet Take 1-2 tablets by mouth every 6 (six) hours as needed for headache. 11/25/15 11/24/16  Venetia Maxon Rama, MD  clopidogrel (PLAVIX) 75 MG tablet Take 1 tablet (75 mg total) by mouth daily. 12/16/15   Burtis Junes, NP  cyclobenzaprine (FLEXERIL) 5 MG tablet Take 5 mg by mouth 3 (three) times daily as needed for muscle spasms.    Historical Provider, MD  escitalopram (LEXAPRO) 20 MG tablet Take 20 mg by mouth every morning.     Historical Provider, MD  fenofibrate 160 MG tablet Take 160 mg by mouth daily. Takes at bedtime    Historical Provider, MD  levothyroxine (SYNTHROID, LEVOTHROID) 150 MCG tablet Take 150 mcg by mouth daily before breakfast.     Historical Provider, MD  meclizine (ANTIVERT) 12.5 MG tablet Take 1 tablet (12.5 mg total) by mouth 3 (three) times daily as needed for dizziness. 07/12/16   Barnet Glasgow, NP  metoprolol tartrate (LOPRESSOR) 25 MG tablet Take 0.5 tablets (12.5 mg total) by mouth 2 (two) times daily. 01/20/16 05/11/16  Burtis Junes, NP  morphine (MSIR) 30 MG tablet Take 30 mg by mouth every 12 (twelve) hours.    Historical Provider, MD  nitroGLYCERIN (NITROSTAT) 0.4 MG SL tablet Place 1 tablet (0.4 mg total) under the tongue every 5 (five) minutes as needed for chest pain. 12/02/15   Burtis Junes, NP  omega-3 acid ethyl esters (LOVAZA) 1 G capsule Take 2 g by mouth 2 (two) times daily.    Historical Provider, MD  OVER THE COUNTER MEDICATION Take 2 capsules by mouth daily. "Relizen" herbal supplement for hormone replacement     Historical Provider, MD  ranitidine (ZANTAC) 150 MG tablet Take 150 mg by mouth daily.    Historical Provider, MD  rOPINIRole (REQUIP) 0.5 MG tablet Take 0.5 mg by mouth every evening.     Historical Provider, MD  simvastatin (ZOCOR) 20 MG tablet Take 1 tablet (20 mg total) by mouth every other day. alternating with 40 mg by mouth. 06/12/16   Josue Hector, MD  simvastatin (ZOCOR) 40 MG tablet Take 1 tablet (40 mg total) by mouth every other day. Alternating with 20 mg by mouth every other day. 06/12/16   Josue Hector, MD   Meds Ordered and Administered this Visit  Medications - No data to display  BP (!) 107/47 (BP Location: Right Arm)    Pulse (!) 52   Temp 98.2 F (36.8 C) (Oral)   Resp 16   SpO2 (!) 49%  No data found.   Physical Exam  Constitutional: She is oriented to person, place, and time. She appears well-developed and well-nourished. No distress.  HENT:  Head: Normocephalic and atraumatic.  Right Ear: Tympanic membrane and external ear normal.  Left Ear: Tympanic membrane and external ear normal.  Nose: Nose normal.  Mouth/Throat: Oropharynx is clear and moist.  Eyes: Conjunctivae and EOM are normal. Pupils are equal, round, and reactive to light.  Neck: Normal range of motion. Neck supple.  Cardiovascular: Normal rate and regular rhythm.  No murmur heard. Pulmonary/Chest: Effort normal and breath sounds normal. No respiratory distress.  Abdominal: Soft. Bowel sounds are normal. There is no tenderness.  Musculoskeletal: She exhibits no edema.  Lymphadenopathy:    She has no cervical adenopathy.  Neurological: She is alert and oriented to person, place, and time.  Skin: Skin is warm and dry. Capillary refill takes less than 2 seconds. She is not diaphoretic.  Psychiatric: She has a normal mood and affect.  Nursing note and vitals reviewed.   Urgent Care Course     Procedures (including critical care time)  Labs Review Labs Reviewed - No data to display  Imaging Review No results found.   Visual Acuity Review  Right Eye Distance:   Left Eye Distance:   Bilateral Distance:    Right Eye Near:   Left Eye Near:    Bilateral Near:         MDM   1. Vertigo   You are being treated today for vertigo. I have started you on a medicine called Antivert, take 1 tablet up to three times a day as needed for dizziness. Your symptoms should resolve within 3-5 days. If they do not, I would recommend follow up with your primary care provider. If your symptoms worsen, return to clinic or consider the emergency room as needed.      Barnet Glasgow, NP 07/12/16 1531

## 2016-07-12 NOTE — Discharge Instructions (Signed)
You are being treated today for vertigo. I have started you on a medicine called Antivert, take 1 tablet up to three times a day as needed for dizziness. Your symptoms should resolve within 3-5 days. If they do not, I would recommend follow up with your primary care provider. If your symptoms worsen, return to clinic or consider the emergency room as needed.

## 2016-07-12 NOTE — ED Triage Notes (Signed)
The patient presented to the [facility] with a complaint of a new onset of dizziness that started this am. The patient stated that she does not have any ear pain and the patient exhibited no other neurological deficits.

## 2016-07-13 ENCOUNTER — Encounter (HOSPITAL_COMMUNITY): Payer: Self-pay

## 2016-07-13 ENCOUNTER — Telehealth: Payer: Self-pay | Admitting: Cardiovascular Disease

## 2016-07-13 NOTE — Telephone Encounter (Signed)
Don't think dizziness form meds continue antivert and youre suggestions

## 2016-07-13 NOTE — Telephone Encounter (Signed)
Called patient back about her message. Patient stated that she felt extremely dizzy and had no balance yesterday. Patient stated her BP was 90/50 at urgent care yesterday. Patient stated this is the first time she has felt like this. Patient stated her BP was 113/70 today with HR 51. Patient stated she is feeling some what better taking Antivert that was prescribed yesterday. Encouraged patient to check her BP before she takes her metoprolol and to hold if SBP is below 110. Informed patient that message would be sent to Dr. Johnsie Cancel for further advisement.

## 2016-07-13 NOTE — Telephone Encounter (Signed)
New Message     Yesterday the pt went to urgent care for vertigo, urgent care said her bp is 90/50, can this cause vertigo and lack of balance

## 2016-07-13 NOTE — Telephone Encounter (Signed)
Called patient with Dr. Nishan's advisement. Patient verbalized understanding. 

## 2016-07-15 ENCOUNTER — Encounter (HOSPITAL_COMMUNITY): Payer: Self-pay

## 2016-07-15 DIAGNOSIS — H2513 Age-related nuclear cataract, bilateral: Secondary | ICD-10-CM | POA: Diagnosis not present

## 2016-07-15 DIAGNOSIS — H5203 Hypermetropia, bilateral: Secondary | ICD-10-CM | POA: Diagnosis not present

## 2016-07-15 DIAGNOSIS — H52222 Regular astigmatism, left eye: Secondary | ICD-10-CM | POA: Diagnosis not present

## 2016-07-17 ENCOUNTER — Encounter (HOSPITAL_COMMUNITY): Payer: Self-pay

## 2016-07-20 ENCOUNTER — Encounter (HOSPITAL_COMMUNITY): Payer: Self-pay

## 2016-07-22 ENCOUNTER — Encounter (HOSPITAL_COMMUNITY): Payer: Self-pay

## 2016-07-24 ENCOUNTER — Encounter (HOSPITAL_COMMUNITY): Payer: Self-pay

## 2016-07-27 ENCOUNTER — Encounter (HOSPITAL_COMMUNITY): Payer: Self-pay

## 2016-07-29 ENCOUNTER — Encounter (HOSPITAL_COMMUNITY): Payer: Self-pay

## 2016-07-31 ENCOUNTER — Encounter (HOSPITAL_COMMUNITY): Payer: Self-pay

## 2016-08-03 ENCOUNTER — Telehealth: Payer: Self-pay | Admitting: Cardiovascular Disease

## 2016-08-03 ENCOUNTER — Encounter (HOSPITAL_COMMUNITY): Payer: Self-pay

## 2016-08-03 DIAGNOSIS — I2510 Atherosclerotic heart disease of native coronary artery without angina pectoris: Secondary | ICD-10-CM | POA: Insufficient documentation

## 2016-08-03 DIAGNOSIS — Z96652 Presence of left artificial knee joint: Secondary | ICD-10-CM | POA: Diagnosis not present

## 2016-08-03 DIAGNOSIS — Z79899 Other long term (current) drug therapy: Secondary | ICD-10-CM | POA: Insufficient documentation

## 2016-08-03 DIAGNOSIS — I252 Old myocardial infarction: Secondary | ICD-10-CM | POA: Diagnosis not present

## 2016-08-03 DIAGNOSIS — R040 Epistaxis: Secondary | ICD-10-CM | POA: Insufficient documentation

## 2016-08-03 DIAGNOSIS — E039 Hypothyroidism, unspecified: Secondary | ICD-10-CM | POA: Insufficient documentation

## 2016-08-03 DIAGNOSIS — Z7982 Long term (current) use of aspirin: Secondary | ICD-10-CM | POA: Insufficient documentation

## 2016-08-03 NOTE — Telephone Encounter (Signed)
Will forward to Dr. Nishan for advisement. ?

## 2016-08-03 NOTE — Telephone Encounter (Signed)
Called patient back and informed her that she could try Coricidin, that it is recommended for patient's that have high BP. Patient verbalized understanding and will call with any other questions.

## 2016-08-03 NOTE — ED Triage Notes (Signed)
Pt states that her nose started bleeding about 45 minutes ago, she has had a cold and lots of congestion and it started after she was blowing her nose Pt is on Plavix and ASA

## 2016-08-03 NOTE — Telephone Encounter (Signed)
New Message:     Pt says she has a lot of congestion in her chest. She had a heart attack in June of 2017,she wants to know what type of medicine she can take?She is very concerned about medicine she takes,since she had her heart attack.

## 2016-08-04 ENCOUNTER — Emergency Department (HOSPITAL_COMMUNITY)
Admission: EM | Admit: 2016-08-04 | Discharge: 2016-08-04 | Disposition: A | Discharge status: Home / Self Care | Payer: 59 | Attending: Emergency Medicine | Admitting: Emergency Medicine

## 2016-08-04 DIAGNOSIS — R040 Epistaxis: Secondary | ICD-10-CM

## 2016-08-04 DIAGNOSIS — Z96652 Presence of left artificial knee joint: Secondary | ICD-10-CM | POA: Diagnosis not present

## 2016-08-04 DIAGNOSIS — Z7982 Long term (current) use of aspirin: Secondary | ICD-10-CM | POA: Diagnosis not present

## 2016-08-04 DIAGNOSIS — E039 Hypothyroidism, unspecified: Secondary | ICD-10-CM | POA: Diagnosis not present

## 2016-08-04 DIAGNOSIS — Z79899 Other long term (current) drug therapy: Secondary | ICD-10-CM | POA: Diagnosis not present

## 2016-08-04 DIAGNOSIS — I2510 Atherosclerotic heart disease of native coronary artery without angina pectoris: Secondary | ICD-10-CM | POA: Diagnosis not present

## 2016-08-04 DIAGNOSIS — I252 Old myocardial infarction: Secondary | ICD-10-CM | POA: Diagnosis not present

## 2016-08-04 LAB — INFLUENZA PANEL BY PCR (TYPE A & B)
Influenza A By PCR: NEGATIVE
Influenza B By PCR: NEGATIVE

## 2016-08-04 MED ORDER — SILVER NITRATE-POT NITRATE 75-25 % EX MISC
2.0000 | Freq: Once | CUTANEOUS | Status: AC
  Administered 2016-08-04: 2 via TOPICAL
  Filled 2016-08-04: qty 2

## 2016-08-04 NOTE — ED Notes (Signed)
Lab called for swab to complete influenza panel.

## 2016-08-04 NOTE — ED Provider Notes (Signed)
[facility] DEPT Provider Note   CSN: [id number] Arrival date & time: 08/03/16  2312   By signing my name below, I, Soijett Blue, attest that this documentation has been prepared under the direction and in the presence of Everlene Balls, MD. Electronically Signed: Soijett Blue, ED Scribe. 08/04/16. 12:31 AM.  History   Chief Complaint Chief Complaint  Patient presents with  . Epistaxis    HPI Brittany Mason is a 66 y.o. female with a PMHx of MVP, who presents to the Emergency Department complaining of right sided nose bleed onset 2 hours ago. Pt reports 3 days of associated nasal congestion, fever (TMAX 100.8), chills, and cough. Pt has tried applying pressure and Mucinex with no relief of her symptoms. She notes that her cardiologist approved her use of Mucinex for her symptoms. Pt notes that she takes daily ASA and plavix. She denies body aches, abdominal pain, nausea, vomiting, and any other symptoms.  The history is provided by the patient. No language interpreter was used.    Past Medical History:  Diagnosis Date  . Acute pulmonary embolism ([place]) 06/20/2015  . Arthritis    osteoarthritis-knees. Chronic back pain  . Basal cell carcinoma of right shoulder    "burned off"  . Chronic SI joint pain   . DVT (deep venous thrombosis) ([place]) 04/2013   left lower leg, resulted in Pulmonary emboli on hormone therapy  . Dyspnea on exertion   . Hypercholesterolemia   . Hypothyroidism   . Migraine    "< once/month" (12/04/2015)  . MVP (mitral valve prolapse)    asymptomatic  . Myocardial infarction 11/19/2015   cardiac arrest  . PE (pulmonary embolism) Apr 18, 2013   tx. -using Xarelto now, no further lung problems, denies SOB on 01-02-14  . PONV (postoperative nausea and vomiting)   . RLS (restless legs syndrome)     Patient Active Problem List   Diagnosis Date Noted  . CAD in native artery 12/04/2015  . Cardiomyopathy, ischemic 12/04/2015  . CAD (coronary artery disease),  native coronary artery 12/04/2015  . History of pulmonary embolism 11/25/2015  . Lactic acidosis 11/25/2015  . AKI (acute kidney injury) ([place]) 11/25/2015  . Aspiration pneumonia ([place]) 11/25/2015  . Hyperglycemia 11/25/2015  . Normocytic anemia 11/25/2015  . Acute respiratory failure with hypoxia ([place])   . Cardiogenic shock ([place])   . NSTEMI (non-ST elevated myocardial infarction) ([place])   . Cardiac arrest ([place]) 11/19/2015  . Chest pain 08/28/2015  . Retinal detachment 05/07/2014  . OA (osteoarthritis) of knee 01/08/2014  . Preoperative respiratory examination 11/22/2013  . Pulmonary embolism ([place]) 04/18/2013  . Chronic back pain 04/18/2013  . Dyspnea on exertion 04/06/2013  . 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 Hypothyroidism   . Hypercholesterolemia     Past Surgical History:  Procedure Laterality Date  . ANTERIOR CRUCIATE LIGAMENT REPAIR Left 1976; 1981   "open"  . CARDIAC CATHETERIZATION N/A 11/20/2015   Procedure: Left Heart Cath and Coronary Angiography;  Surgeon: Belva Crome, MD;  Location: [facility];  Service: Cardiovascular;  Laterality: N/A;  . CARDIAC CATHETERIZATION N/A 12/04/2015   Procedure: Left Heart Cath and Coronary Angiography;  Surgeon: Peter M Martinique, MD;  Location: [facility];  Service: Cardiovascular;  Laterality: N/A;  . CARDIAC CATHETERIZATION  12/04/2015   Procedure: Coronary Stent Intervention;  Surgeon: Peter M Martinique, MD;  Location: [facility];  Service: Cardiovascular;;  . COLONOSCOPY  09/28/2011   Procedure: COLONOSCOPY;  Surgeon: Juanita Craver,  MD;  Location: [facility];  Service: Endoscopy;  Laterality: N/A;  . JOINT REPLACEMENT    . TOTAL ABDOMINAL HYSTERECTOMY  1998  . TOTAL KNEE ARTHROPLASTY Left 01/08/2014   Procedure: LEFT TOTAL KNEE ARTHROPLASTY;  Surgeon: Gearlean Alf, MD;  Location: [facility];  Service: Orthopedics;  Laterality: Left;    OB History    Gravida Para Term Preterm AB Living   0             SAB TAB Ectopic  Multiple Live Births                   Home Medications    Prior to Admission medications   Medication Sig Start Date End Date Taking? Authorizing Provider  aspirin 81 MG tablet Take 81 mg by mouth every morning.    Yes Historical Provider, MD  butalbital-acetaminophen-caffeine (FIORICET) 50-325-40 MG tablet Take 1-2 tablets by mouth every 6 (six) hours as needed for headache. 11/25/15 11/24/16 Yes Venetia Maxon Rama, MD  clopidogrel (PLAVIX) 75 MG tablet Take 1 tablet (75 mg total) by mouth daily. Patient taking differently: Take 75 mg by mouth every morning.  12/16/15  Yes Burtis Junes, NP  cyclobenzaprine (FLEXERIL) 5 MG tablet Take 5 mg by mouth 3 (three) times daily as needed for muscle spasms.   Yes Historical Provider, MD  escitalopram (LEXAPRO) 20 MG tablet Take 20 mg by mouth every morning.    Yes Historical Provider, MD  fenofibrate 160 MG tablet Take 160 mg by mouth at bedtime.    Yes Historical Provider, MD  guaiFENesin (MUCINEX) 600 MG 12 hr tablet Take 600 mg by mouth 2 (two) times daily.   Yes Historical Provider, MD  levothyroxine (SYNTHROID, LEVOTHROID) 150 MCG tablet Take 150 mcg by mouth daily before breakfast.    Yes Historical Provider, MD  metoprolol tartrate (LOPRESSOR) 25 MG tablet Take 0.5 tablets (12.5 mg total) by mouth 2 (two) times daily. 01/20/16 08/03/16 Yes Burtis Junes, NP  morphine (MSIR) 30 MG tablet Take 30 mg by mouth every 12 (twelve) hours.   Yes Historical Provider, MD  nitroGLYCERIN (NITROSTAT) 0.4 MG SL tablet Place 1 tablet (0.4 mg total) under the tongue every 5 (five) minutes as needed for chest pain. 12/02/15  Yes Burtis Junes, NP  omega-3 acid ethyl esters (LOVAZA) 1 G capsule Take 2 g by mouth 2 (two) times daily.   Yes Historical Provider, MD  OVER THE COUNTER MEDICATION Take 2 capsules by mouth daily. "Relizen" herbal supplement for hormone replacement    Yes Historical Provider, MD  rOPINIRole (REQUIP) 0.5 MG tablet Take 0.25 mg by mouth  every evening.    Yes Historical Provider, MD  simvastatin (ZOCOR) 40 MG tablet Take 1 tablet (40 mg total) by mouth every other day. Alternating with 20 mg by mouth every other day. Patient taking differently: Take 40 mg by mouth daily at 6 PM.  06/12/16  Yes Josue Hector, MD  meclizine (ANTIVERT) 12.5 MG tablet Take 1 tablet (12.5 mg total) by mouth 3 (three) times daily as needed for dizziness. Patient not taking: Reported on 08/03/2016 07/12/16   Barnet Glasgow, NP  simvastatin (ZOCOR) 20 MG tablet Take 1 tablet (20 mg total) by mouth every other day. alternating with 40 mg by mouth. Patient not taking: Reported on 08/03/2016 06/12/16   Josue Hector, MD    Family History Family History  Problem Relation Age of Onset  . Hypertension Mother   .  Congestive Heart Failure Mother   . Coronary artery disease Father   . Hypertension Father   . Stroke Brother     DIED AT 23  . Cancer Maternal Aunt     lung, colon  . Breast cancer Maternal Aunt   . Breast cancer Paternal Aunt     Social History Social History  Substance Use Topics  . Smoking status: Never Smoker  . Smokeless tobacco: Never Used  . Alcohol use Yes     Comment: 12/04/2015 2-3 times a year, one drink at at time     Allergies   Codeine and Erythromycin   Review of Systems Review of Systems A complete 10 system review of systems was obtained and all systems are negative except as noted in the HPI and PMH.   Physical Exam Updated Vital Signs BP 121/82 (BP Location: Right Arm)   Pulse 62   Temp 99.9 F (37.7 C) (Oral)   Resp 20   SpO2 96%   Physical Exam  Constitutional: She is oriented to person, place, and time. She appears well-developed and well-nourished. No distress.  HENT:  Head: Normocephalic and atraumatic.  Nose: Nose normal.  Mouth/Throat: Oropharynx is clear and moist. No oropharyngeal exudate.  Dried blood in right nasal pharynx. No blood seen in posterior oropharynx.   Eyes: Conjunctivae and  EOM are normal. Pupils are equal, round, and reactive to light. No scleral icterus.  Neck: Normal range of motion. Neck supple. No JVD present. No tracheal deviation present. No thyromegaly present.  Cardiovascular: Normal rate, regular rhythm and normal heart sounds.  Exam reveals no gallop and no friction rub.   No murmur heard. Pulmonary/Chest: Effort normal and breath sounds normal. No respiratory distress. She has no wheezes. She exhibits no tenderness.  Abdominal: Soft. Bowel sounds are normal. She exhibits no distension and no mass. There is no tenderness. There is no rebound and no guarding.  Musculoskeletal: Normal range of motion. She exhibits no edema or tenderness.  Lymphadenopathy:    She has no cervical adenopathy.  Neurological: She is alert and oriented to person, place, and time. No cranial nerve deficit. She exhibits normal muscle tone.  Skin: Skin is warm and dry. No rash noted. No erythema. No pallor.  Nursing note and vitals reviewed.    ED Treatments / Results  DIAGNOSTIC STUDIES: Oxygen Saturation is 96% on RA, nl by my interpretation.    COORDINATION OF CARE: 12:20 AM Discussed treatment plan with pt at bedside which includes epistaxis control and pt agreed to plan.   Procedures .Epistaxis Management Date/Time: 08/04/2016 12:46 AM Performed by: Everlene Balls Authorized by: Everlene Balls   Consent:    Consent obtained:  Verbal   Consent given by:  Patient   Risks discussed:  Bleeding   Alternatives discussed:  Observation Anesthesia (see MAR for exact dosages):    Anesthesia method:  None Procedure details:    Treatment site:  R anterior, R posterior and R septum   Treatment method:  Silver nitrate   Treatment complexity:  Limited   Treatment episode: initial   Post-procedure details:    Assessment:  Bleeding stopped   Patient tolerance of procedure:  Tolerated well, no immediate complications   (including critical care time)  Medications Ordered in  ED Medications - No data to display   Initial Impression / Assessment and Plan / ED Course  I have reviewed the triage vital signs and the nursing notes.  Patient presents to the ED for nosebleed.  On my initial evaluation, the patient was able to get the bleeding to stop.  I see the area that was bleeding and it was cauterized.  She was observed in the ED for 20 more min without any recurrence. I do not believe her symptoms represent the flu, but a swab was sent for her preference.  She was advised to call in or check mychart for results. She appears well and in NAD. VS are normal. Patient is safe for DC.    Final Clinical Impressions(s) / ED Diagnoses   Final diagnoses:  None    New Prescriptions New Prescriptions   No medications on file     I personally performed the services described in this documentation, which was scribed in my presence. The recorded information has been reviewed and is accurate.      Everlene Balls, MD 08/04/16 [phone]

## 2016-08-05 ENCOUNTER — Encounter (HOSPITAL_COMMUNITY): Payer: Self-pay

## 2016-08-07 ENCOUNTER — Encounter (HOSPITAL_COMMUNITY): Payer: Self-pay

## 2016-08-07 MED FILL — AMOXICILLIN 500 MG CAPSULE: 500 | 3 days supply | Qty: 12 | Fill #1 | Status: TO

## 2016-08-10 ENCOUNTER — Encounter (HOSPITAL_COMMUNITY): Payer: Self-pay

## 2016-08-10 DIAGNOSIS — J019 Acute sinusitis, unspecified: Secondary | ICD-10-CM | POA: Diagnosis not present

## 2016-08-10 MED FILL — AMOX TR-K CLV 875-125 MG TA: 875-125 | 10 days supply | Qty: 20 | Fill #0

## 2016-08-12 ENCOUNTER — Encounter (HOSPITAL_COMMUNITY): Payer: Self-pay

## 2016-08-14 ENCOUNTER — Encounter (HOSPITAL_COMMUNITY): Payer: Self-pay

## 2016-08-17 ENCOUNTER — Encounter (HOSPITAL_COMMUNITY): Payer: Self-pay

## 2016-08-19 ENCOUNTER — Encounter (HOSPITAL_COMMUNITY): Payer: Self-pay

## 2016-08-19 DIAGNOSIS — R05 Cough: Secondary | ICD-10-CM | POA: Diagnosis not present

## 2016-08-19 MED FILL — levoFLOXacin 500 MG TABS: 500 | 10 days supply | Qty: 10 | Fill #0

## 2016-08-19 MED FILL — BENZONATATE 200 MG CAP: 200 | 10 days supply | Qty: 30 | Fill #0

## 2016-08-21 ENCOUNTER — Encounter (HOSPITAL_COMMUNITY): Payer: Self-pay

## 2016-08-24 ENCOUNTER — Encounter (HOSPITAL_COMMUNITY): Payer: Self-pay

## 2016-08-26 ENCOUNTER — Encounter (HOSPITAL_COMMUNITY): Payer: Self-pay

## 2016-08-27 MED FILL — MORPHINE SULF ER 30 MG TAB: 30 | 30 days supply | Qty: 60 | Fill #0

## 2016-08-28 ENCOUNTER — Encounter (HOSPITAL_COMMUNITY): Payer: Self-pay

## 2016-08-28 NOTE — Progress Notes (Signed)
CARDIOLOGY OFFICE NOTE  Date:  08/31/2016    Brittany Mason Date of Birth: [birth year] Medical Record #[id number]  PCP:  Gennette Pac, MD  Cardiologist:  Gillian Shields    Chief Complaint  Patient presents with  . Cardiomyopathy    History of Present Illness: Brittany Mason is a 66 y.o. female who presents today for a follow up visit.  Previous  Patient of Dr Gwenlyn Found  She has a history of pulmonary embolism on prior chronic anticoagulation (in the setting of HRT), mitral valve prolapse, hypothyroidism, hypercholesterolemia, and restless leg syndrome who presented in June 2017 with VF cardiac arrest at home - with 16 minutes of down-time. Cath on 6/14 showing total occlusion of prox circumflex (conservative management favored at that time due to cooling protocol and further neurologic recovery). She was not seen by EP. She had been seen here back in March with atypical chest pain - had a normal stress test back in March of 2017. Strong + FH for CAD.  She was discharged 11/25/2015.  Subsequently had stenting of the totally occluded proximal circumflex by Dr Martinique On 12/04/15 with continued occlusion of the distal vessel fed by collaterals.               No chest pain Active Tolerating Zocor needs f/u labs and see lipid clinic  in April One episode of ? Vertigo with dizziness given Antivert   Still working at [employer] may need to start bringing [place] on line soon    Past Medical History:  Diagnosis Date  . Acute pulmonary embolism ([place]) 06/20/2015  . Arthritis    osteoarthritis-knees. Chronic back pain  . Basal cell carcinoma of right shoulder    "burned off"  . Chronic SI joint pain   . DVT (deep venous thrombosis) ([place]) 04/2013   left lower leg, resulted in Pulmonary emboli on hormone therapy  . Dyspnea on exertion   . Hypercholesterolemia   . Hypothyroidism   . Migraine    "< once/month" (12/04/2015)  . MVP (mitral valve prolapse)    asymptomatic  . Myocardial infarction 11/19/2015   cardiac arrest  . PE (pulmonary embolism) Apr 18, 2013   tx. -using Xarelto now, no further lung problems, denies SOB on 01-02-14  . PONV (postoperative nausea and vomiting)   . RLS (restless legs syndrome)     Past Surgical History:  Procedure Laterality Date  . ANTERIOR CRUCIATE LIGAMENT REPAIR Left 1976; 1981   "open"  . CARDIAC CATHETERIZATION N/A 11/20/2015   Procedure: Left Heart Cath and Coronary Angiography;  Surgeon: Belva Crome, MD;  Location: [facility];  Service: Cardiovascular;  Laterality: N/A;  . CARDIAC CATHETERIZATION N/A 12/04/2015   Procedure: Left Heart Cath and Coronary Angiography;  Surgeon: Yanis Juma M Martinique, MD;  Location: [facility];  Service: Cardiovascular;  Laterality: N/A;  . CARDIAC CATHETERIZATION  12/04/2015   Procedure: Coronary Stent Intervention;  Surgeon: Mylo Choi M Martinique, MD;  Location: [facility];  Service: Cardiovascular;;  . COLONOSCOPY  09/28/2011   Procedure: COLONOSCOPY;  Surgeon: Juanita Craver, MD;  [facility]: [facility];  Service: Endoscopy;  Laterality: N/A;  . JOINT REPLACEMENT    . TOTAL ABDOMINAL HYSTERECTOMY  1998  . TOTAL KNEE ARTHROPLASTY Left 01/08/2014   Procedure: LEFT TOTAL KNEE ARTHROPLASTY;  Surgeon: Gearlean Alf, MD;  Location: [facility];  Service: Orthopedics;  Laterality: Left;     Medications: Current Outpatient Prescriptions  Medication Sig  Dispense Refill  . aspirin 81 MG tablet Take 81 mg by mouth every morning.     . butalbital-acetaminophen-caffeine (FIORICET) 50-325-40 MG tablet Take 1-2 tablets by mouth every 6 (six) hours as needed for headache. 20 tablet 0  . clopidogrel (PLAVIX) 75 MG tablet Take 1 tablet (75 mg total) by mouth daily. 90 tablet 2  . cyclobenzaprine (FLEXERIL) 5 MG tablet Take 5 mg by mouth 3 (three) times daily as needed for muscle spasms.    Marland Kitchen escitalopram (LEXAPRO) 20 MG tablet Take 20 mg by mouth every morning.     . fenofibrate  160 MG tablet Take 160 mg by mouth at bedtime.     Marland Kitchen levothyroxine (SYNTHROID, LEVOTHROID) 150 MCG tablet Take 150 mcg by mouth daily before breakfast.     . metoprolol tartrate (LOPRESSOR) 25 MG tablet Take 0.5 tablets (12.5 mg total) by mouth 2 (two) times daily. 90 tablet 3  . morphine (MSIR) 30 MG tablet Take 30 mg by mouth every 12 (twelve) hours.    . nitroGLYCERIN (NITROSTAT) 0.4 MG SL tablet Place 1 tablet (0.4 mg total) under the tongue every 5 (five) minutes as needed for chest pain. 25 tablet 3  . omega-3 acid ethyl esters (LOVAZA) 1 G capsule Take 2 g by mouth 2 (two) times daily.    Marland Kitchen OVER THE COUNTER MEDICATION Take 2 capsules by mouth daily. "Relizen" herbal supplement for hormone replacement     . rOPINIRole (REQUIP) 0.5 MG tablet Take 0.25 mg by mouth every evening.     . simvastatin (ZOCOR) 20 MG tablet Take 1 tablet (20 mg total) by mouth every other day. alternating with 40 mg by mouth. 45 tablet 3  . simvastatin (ZOCOR) 40 MG tablet Take 1 tablet (40 mg total) by mouth every other day. Alternating with 20 mg by mouth every other day. 45 tablet 3   No current facility-administered medications for this visit.     Allergies: Allergies  Allergen Reactions  . Codeine Nausea And Vomiting    Takes hydrocodone; if takes doses too close together, states "violently throws up"  . Erythromycin Hives and Itching    Social History: The patient  reports that she has never smoked. She has never used smokeless tobacco. She reports that she drinks alcohol. She reports that she does not use drugs.   Family History: [name] family history includes Breast cancer in her maternal aunt and paternal aunt; Cancer in her maternal aunt; Congestive Heart Failure in her mother; Coronary artery disease in her father; Hypertension in her father and mother; Stroke in her brother.   Review of Systems: Please see the history of present illness.   Otherwise, the review of systems is positive for  none.   All other systems are reviewed and negative.   Physical Exam: VS:  BP 120/60   Pulse (!) 53   Ht 5\' 3"  (1.6 m)   Wt 176 lb 6.4 oz (80 kg)   SpO2 98%   BMI 31.25 kg/m  .  BMI Body mass index is 31.25 kg/m.  Wt Readings from Last 3 Encounters:  08/31/16 176 lb 6.4 oz (80 kg)  05/11/16 175 lb 3.2 oz (79.5 kg)  04/02/16 174 lb 12.8 oz (79.3 kg)    General: Pleasant. Well developed, well nourished and in no acute distress.   HEENT: Normal.  Neck: Supple, no JVD, carotid bruits, or masses noted.  Cardiac: Regular rate and rhythm. No murmurs, rubs, or gallops. No edema.  Respiratory:  Lungs are clear to auscultation bilaterally with normal work of breathing.  GI: Soft and nontender.  MS: No deformity or atrophy. Gait and ROM intact.  Skin: Warm and dry. Color is normal.  Neuro:  Strength and sensation are intact and no gross focal deficits noted.  Psych: Alert, appropriate and with normal affect.   LABORATORY DATA:  EKG:  EKG is not ordered today.  Lab Results  Component Value Date   WBC 4.5 12/05/2015   HGB 11.2 (L) 12/05/2015   HCT 35.5 (L) 12/05/2015   PLT 527 (H) 12/05/2015   GLUCOSE 96 01/20/2016   CHOL 202 (H) 06/03/2016   TRIG 89 06/03/2016   HDL 68 06/03/2016   LDLCALC 116 (H) 06/03/2016   ALT 14 06/03/2016   AST 24 06/03/2016   NA 141 01/20/2016   K 4.3 01/20/2016   CL 104 01/20/2016   CREATININE 0.84 01/20/2016   BUN 17 01/20/2016   CO2 30 01/20/2016   TSH 0.39 (L) 01/20/2016   INR 1.1 12/02/2015   HGBA1C 5.7 (H) 11/19/2015    BNP (last 3 results)  Recent Labs  11/19/15 2335 12/02/15 1058  BNP 80.0 170.6*    ProBNP (last 3 results) No results for input(s): PROBNP in the last 8760 hours.   Other Studies Reviewed Today:  Cardiac Cath/PCI Conclusion from 12/04/15     Prox RCA to Mid RCA lesion, 30% stenosed.  RPDA lesion, 60% stenosed.  Prox Cx to Mid Cx lesion, 100% stenosed.  There is mild left ventricular systolic  dysfunction.  Prox Cx lesion, 99% stenosed. Post intervention, there is a 0% residual stenosis.  1. Single vessel obstructive CAD 2. Mild LV dysfunction. 3. Low LVEDP 4. Successful stenting of the proximal LCx into a large bifurcating OM1. The distal LCx is still occluded with right to left collaterals. This branch is relatively small.   Plan: DAPT for one year. Will stop Lasix. Given improvement in EF would not recommend Life Vest at this point. Anticipate DC in am.      Cardiac Catheterization: 11/20/2015  Prox RCA to Dist RCA lesion, 45% stenosed.  RPDA lesion, 65% stenosed.  Prox Cx to Mid Cx lesion, 100% stenosed.   Total occlusion (Acute vs Chronic) of the proximal circumflex with collaterals from left to left and right to left. The collaterals appear to be meager however, the patient is on the cooling protocol and I suspect that collateralization will significantly improved once the patient is back to body temperature. If angina develops post arrest, recanalization of the circumflex coronary artery would be reasonable.  Diffuse moderate stenosis in a large PDA, up to 70% obstruction. Relatively small distribution LAD that reaches, but does not wrap around, the left ventricular apex. No significant obstruction is noted in the LAD.  Mid inferior wall moderate hypokinesis. Estimated ejection fraction 45-50%. Marked elevation in left ventricular end-diastolic pressure of 40 mmHg secondary to acute ischemia and cooling.   Recommendations:  Conservative medical management for the time being.  I would add Plavix to the patient's medical regimen.  Should the patient have a significant neurological recovery, angina/ongoing evidence of ischemia could be treated with circumflex PCI. We chose against PCI today given cooling, vasoconstriction related to cooling, and clinical stability at the present time without evidence of ongoing ischemia.   Echocardiogram:  11/20/2015 Study Conclusions  - Left ventricle: The cavity size was normal. There was mild focal  basal hypertrophy of the septum. Systolic function was moderately  reduced.  The estimated ejection fraction was in the range of 35%  to 40%. Diffuse hypokinesis. There is akinesis of the  mid-apicalinferolateral and inferior myocardium. Doppler  parameters are consistent with abnormal left ventricular  relaxation (grade 1 diastolic dysfunction). - Aortic valve: Trileaflet; mildly thickened, mildly calcified  leaflets. There was mild regurgitation directed eccentrically in  the LVOT. - Aorta: Aortic root dimension: 38 mm (ED). - Ascending aorta: The ascending aorta was mildly dilated. - Mitral valve: Calcified annulus. - Right ventricle: The cavity size was moderately dilated. Wall  thickness was normal. Systolic function was moderately reduced.  Assessment/Plan: 1. V-fib Cardiac Arrest/ NSTEMI - with LV dysfunction - Cath on 6/14 showed total occlusion of prox circumflex (conservative management favored at that time due to cooling protocol and to assess for further neurologic recovery). She is now s/p PCI to the LCX note indicated distal circumflex still occluded with collaterals Proximal vessel was stented into a large bifurcating OM1 . Mild LV dysfunction noted at time of repeat cath with EF of 45 to 50%.   2. Ischemic CM - mild LV dysfunction noted on repeat cath. Life Vest is off. Doing very well clinically.  Seen by EP and no AICD indicated  EF 45-50% by echo 04/10/16    3. Hypokalemia - potassium 4.5 on recent lab by PCP - rechecking today.   4. HLD - cant tolerate crestor or lipitor has tolerated zocor  And taking CoQ  f/u lipid clinic with labs April   5. Elevated LFTs - improved on last recheck.    6. Short term memory issues - greatly improved.   7. PVCs - seen by Dr Curt Bears continue beta blocker   8. Dizzy: spell resolved no evidence of arrhythmia ? Vertigo  PRN meclizine    Current medicines are reviewed with the patient today.  The patient does not have concerns regarding medicines other than what has been noted above.  The following changes have been made:  See above.  Labs/ tests ordered today include: lipids/liver f/u lipid clinic    Orders Placed This Encounter  Procedures  . Lipid panel  . Hepatic function panel     Jenkins Rouge

## 2016-08-30 MED FILL — levoFLOXacin 500 MG TABS: 500 | 10 days supply | Qty: 10 | Fill #1

## 2016-08-31 ENCOUNTER — Encounter (HOSPITAL_COMMUNITY): Payer: Self-pay

## 2016-08-31 ENCOUNTER — Ambulatory Visit (INDEPENDENT_AMBULATORY_CARE_PROVIDER_SITE_OTHER): Payer: 59 | Admitting: Cardiovascular Disease

## 2016-08-31 ENCOUNTER — Encounter: Payer: Self-pay | Admitting: Cardiovascular Disease

## 2016-08-31 VITALS — BP 120/60 | HR 53 | Ht 63.0 in | Wt 176.4 lb

## 2016-08-31 DIAGNOSIS — E7800 Pure hypercholesterolemia, unspecified: Secondary | ICD-10-CM | POA: Diagnosis not present

## 2016-08-31 DIAGNOSIS — I255 Ischemic cardiomyopathy: Secondary | ICD-10-CM | POA: Diagnosis not present

## 2016-08-31 MED ORDER — CLOPIDOGREL BISULFATE 75 MG PO TABS: 75.0000 mg | ORAL_TABLET | Freq: Every day | ORAL | 2 refills | Status: DC

## 2016-08-31 MED ORDER — METOPROLOL TARTRATE 25 MG PO TABS: 12.5000 mg | ORAL_TABLET | Freq: Two times a day (BID) | ORAL | 3 refills | Status: DC

## 2016-08-31 NOTE — Patient Instructions (Signed)
Medication Instructions:  Same-no changes  Labwork: Lipids and LFTs in April. Please do not eat or drink after midnight the night before labs are drawn.  Testing/Procedures: None  Follow-Up: Lipid clinic in April.  Your physician wants you to follow-up in: 6 months. You will receive a reminder letter in the mail two months in advance. If you don't receive a letter, please call our office to schedule the follow-up appointment.   Any Other Special Instructions Will Be Listed Below (If Applicable).     If you need a refill on your cardiac medications before your next appointment, please call your pharmacy.

## 2016-09-02 ENCOUNTER — Encounter (HOSPITAL_COMMUNITY): Payer: Self-pay

## 2016-09-03 MED FILL — SIMVASTATIN 40 MG TABLET: 40 | 90 days supply | Qty: 45 | Fill #1

## 2016-09-03 MED FILL — SIMVASTATIN 20 MG TABLET: 20 | 90 days supply | Qty: 45 | Fill #1

## 2016-09-03 MED FILL — CLOPIDOGREL 75 MG TABLET: 75 | 90 days supply | Qty: 90 | Fill #3

## 2016-09-04 ENCOUNTER — Encounter (HOSPITAL_COMMUNITY): Payer: Self-pay

## 2016-09-07 ENCOUNTER — Encounter (HOSPITAL_COMMUNITY): Payer: Self-pay

## 2016-09-08 ENCOUNTER — Other Ambulatory Visit: Payer: 59 | Admitting: *Deleted

## 2016-09-08 ENCOUNTER — Ambulatory Visit (INDEPENDENT_AMBULATORY_CARE_PROVIDER_SITE_OTHER): Payer: 59 | Admitting: Pharmacist

## 2016-09-08 DIAGNOSIS — E7800 Pure hypercholesterolemia, unspecified: Secondary | ICD-10-CM

## 2016-09-08 LAB — HEPATIC FUNCTION PANEL
ALT: 22 IU/L (ref 0–32)
AST: 33 IU/L (ref 0–40)
Albumin: 4.6 g/dL (ref 3.6–4.8)
Alkaline Phosphatase: 43 IU/L (ref 39–117)
BILIRUBIN TOTAL: 0.3 mg/dL (ref 0.0–1.2)
BILIRUBIN, DIRECT: 0.1 mg/dL (ref 0.00–0.40)
TOTAL PROTEIN: 7.2 g/dL (ref 6.0–8.5)

## 2016-09-08 LAB — LIPID PANEL
CHOL/HDL RATIO: 2.8 ratio (ref 0.0–4.4)
Cholesterol, Total: 177 mg/dL (ref 100–199)
HDL: 64 mg/dL (ref >39)
LDL CALC: 96 mg/dL (ref 0–99)
Triglycerides: 83 mg/dL (ref 0–149)
VLDL Cholesterol Cal: 17 mg/dL (ref 5–40)

## 2016-09-08 MED ORDER — SIMVASTATIN 40 MG PO TABS: 40.0000 mg | ORAL_TABLET | Freq: Every day | ORAL | 3 refills | Status: DC

## 2016-09-08 MED ORDER — EZETIMIBE 10 MG PO TABS: 10.0000 mg | ORAL_TABLET | Freq: Every day | ORAL | 3 refills | Status: DC

## 2016-09-08 MED FILL — EZETIMIBE 10 MG TAB: 10 | 90 days supply | Qty: 90 | Fill #0 | Status: TO

## 2016-09-08 NOTE — Patient Instructions (Addendum)
Continue taking your simvastatin 40mg  daily - take this at bedtime.  Start taking Zetia (ezetimibe) 10mg  once a day for your cholesterol.  Recheck labs in 2-3 months on Wednesday, June 13th. Come in any time after 7:30am for fasting lipid panel.

## 2016-09-08 NOTE — Progress Notes (Signed)
Patient ID: Brittany Mason                 DOB: May 26, 1951                    MRN: [id number]     HPI: Brittany Mason is a 66 y.o. female patient referred to lipid clinic by Dr Johnsie Cancel. PMH is significant for CAD s/p MI with cath on 12/04/15 showing RPDA lesion with 60% stenosis, 100% stenosis of Prox Cx to Mid Cx, and Prox Cx lesion with 99% stenosis s/p PCI, family history of CAD, mitral valve prolapse, HLD, hypothyroidism, PE, and RLS. Lipid panel 05/2016 showed LDL of 116 and pt was advised to increase her simvastatin to 20mg  and 40mg  alternating days.  Pt reports she has been taking simvastatin 40mg  daily for the past 3 months. She did not want to alternate days with the 20mg  and 40mg  because she was taking simvastatin 40mg  at the time of her heart attack and understands she needs more LDL lowering. She is tolerating simvastatin 40mg  well. She did experience myalgias with Lipitor 80mg  daily and Crestor 10mg  daily and 5mg  3x per week. She experienced myalgias within 4 days of starting Lipitor and Crestor. Symptoms resolved within 4 days of discontinuation.  Current Medications: simvastatin 40mg  daily, fenofibrate 160mg  daily, fish oil 2g BID Intolerances: Lipitor 80mg  daily, Crestor 10mg  daily and 5mg  on MWF - myalgias in her legs Risk Factors: CAD s/p PCI, MI, family history, obesity LDL goal: 70mg /dL  Diet: Chicken/turkey, beans, salads. Cut out red meat and processed meat. Cut back on dairy - cheese.  Exercise: Cardiac rehab after her MI. Uses her treadmill and elliptical.  Family History: [name] family history includes Breast cancer in her maternal aunt and paternal aunt; Cancer in her maternal aunt; Congestive Heart Failure in her mother; Coronary artery disease in her father; Hypertension in her father and mother; Stroke in her brother.  Social History: The patient  reports that she has never smoked. She has never used smokeless tobacco. She reports that she drinks alcohol. She  reports that she does not use drugs.  Labs: 09/08/16: checking today (simvastatin 40mg  daily) 06/03/16: TC 202, TG 89, HDL 68, LDL 116 (simvastatin 20mg  daily)  Past Medical History:  Diagnosis Date  . Acute pulmonary embolism ([facility]) 06/20/2015  . Arthritis    osteoarthritis-knees. Chronic back pain  . Basal cell carcinoma of right shoulder    "burned off"  . Chronic SI joint pain   . DVT (deep venous thrombosis) ([facility]) 04/2013   left lower leg, resulted in Pulmonary emboli on hormone therapy  . Dyspnea on exertion   . Hypercholesterolemia   . Hypothyroidism   . Migraine    "< once/month" (12/04/2015)  . MVP (mitral valve prolapse)    asymptomatic  . Myocardial infarction 11/19/2015   cardiac arrest  . PE (pulmonary embolism) Apr 18, 2013   tx. -using Xarelto now, no further lung problems, denies SOB on 01-02-14  . PONV (postoperative nausea and vomiting)   . RLS (restless legs syndrome)     Current Outpatient Prescriptions on File Prior to Visit  Medication Sig Dispense Refill  . aspirin 81 MG tablet Take 81 mg by mouth every morning.     . butalbital-acetaminophen-caffeine (FIORICET) 50-325-40 MG tablet Take 1-2 tablets by mouth every 6 (six) hours as needed for headache. 20 tablet 0  . clopidogrel (PLAVIX) 75 MG tablet Take 1 tablet (75 mg total) by  mouth daily. 90 tablet 2  . cyclobenzaprine (FLEXERIL) 5 MG tablet Take 5 mg by mouth 3 (three) times daily as needed for muscle spasms.    Marland Kitchen escitalopram (LEXAPRO) 20 MG tablet Take 20 mg by mouth every morning.     . fenofibrate 160 MG tablet Take 160 mg by mouth at bedtime.     Marland Kitchen levothyroxine (SYNTHROID, LEVOTHROID) 150 MCG tablet Take 150 mcg by mouth daily before breakfast.     . metoprolol tartrate (LOPRESSOR) 25 MG tablet Take 0.5 tablets (12.5 mg total) by mouth 2 (two) times daily. 90 tablet 3  . morphine (MSIR) 30 MG tablet Take 30 mg by mouth every 12 (twelve) hours.    . nitroGLYCERIN (NITROSTAT) 0.4 MG SL tablet Place 1  tablet (0.4 mg total) under the tongue every 5 (five) minutes as needed for chest pain. 25 tablet 3  . omega-3 acid ethyl esters (LOVAZA) 1 G capsule Take 2 g by mouth 2 (two) times daily.    Marland Kitchen OVER THE COUNTER MEDICATION Take 2 capsules by mouth daily. "Relizen" herbal supplement for hormone replacement     . rOPINIRole (REQUIP) 0.5 MG tablet Take 0.25 mg by mouth every evening.     . simvastatin (ZOCOR) 20 MG tablet Take 1 tablet (20 mg total) by mouth every other day. alternating with 40 mg by mouth. 45 tablet 3  . simvastatin (ZOCOR) 40 MG tablet Take 1 tablet (40 mg total) by mouth every other day. Alternating with 20 mg by mouth every other day. 45 tablet 3   No current facility-administered medications on file prior to visit.     Allergies  Allergen Reactions  . Codeine Nausea And Vomiting    Takes hydrocodone; if takes doses too close together, states "violently throws up"  . Erythromycin Hives and Itching    Assessment/Plan:  1. Hyperlipidemia - LDL goal < 70mg /dL given hx of CAD s/p MI and PCI. Checking lipids today. Will continue simvastatin 40mg  daily and start Zetia 10mg  daily. Recheck lipids in 2-3 months. Did discuss PCSK9i today, can pursue this option if LDL elevated upon recheck. Pt will look for low fat dairy options.   Megan E. Supple, PharmD, CPP, [facility] [address] [place], [place] [address] Phone: [phone]; Fax: [phone] 09/08/2016 9:23 AM

## 2016-09-09 ENCOUNTER — Encounter (HOSPITAL_COMMUNITY): Payer: Self-pay

## 2016-09-11 ENCOUNTER — Encounter (HOSPITAL_COMMUNITY): Payer: Self-pay

## 2016-09-14 ENCOUNTER — Encounter (HOSPITAL_COMMUNITY): Payer: Self-pay

## 2016-09-16 ENCOUNTER — Encounter (HOSPITAL_COMMUNITY): Payer: Self-pay

## 2016-09-18 ENCOUNTER — Encounter (HOSPITAL_COMMUNITY): Payer: Self-pay

## 2016-09-21 ENCOUNTER — Encounter (HOSPITAL_COMMUNITY): Payer: Self-pay

## 2016-09-22 MED FILL — SYNTHROID 150 MCG TABLET: 150 | 90 days supply | Qty: 90 | Fill #0

## 2016-09-23 ENCOUNTER — Encounter (HOSPITAL_COMMUNITY): Payer: Self-pay

## 2016-09-24 ENCOUNTER — Telehealth: Payer: Self-pay | Admitting: Cardiovascular Disease

## 2016-09-24 DIAGNOSIS — Z01812 Encounter for preprocedural laboratory examination: Secondary | ICD-10-CM

## 2016-09-24 MED ORDER — ISOSORBIDE MONONITRATE ER 30 MG PO TB24: 30.0000 mg | ORAL_TABLET | Freq: Every day | ORAL | 3 refills | Status: DC

## 2016-09-24 MED FILL — ISOSORBIDE MN ER 30 MG TAB: 30 | 90 days supply | Qty: 90 | Fill #0

## 2016-09-24 NOTE — Telephone Encounter (Signed)
New message    Pt c/o of Chest Pain: STAT if CP now or developed within 24 hours  1. Are you having CP right now? no  2. Are you experiencing any other symptoms (ex. SOB, nausea, vomiting, sweating)? Indigestion   3. How long have you been experiencing CP? Only yesterday  4. Is your CP continuous or coming and going? Continuous   5. Have you taken Nitroglycerin? Yes-yesterday at 5pm?   Pt said she called because she is supposed to report when she takes a nitro.

## 2016-09-24 NOTE — Telephone Encounter (Signed)
Patient having heart cath on May 3rd with Dr. Martinique. Patient is aware of instructions and they have been sent through [facility]. Patient will come in for lab work on 10/05/16. Patient verbalized understanding.

## 2016-09-24 NOTE — Telephone Encounter (Signed)
Had complicated stent with Dr Martinique in June 2017 if she had SSCP responsive to nitro should have f/u cath Call in imdur 30 mg daily and set up cath with Dr Martinique next time he is in lab

## 2016-09-24 NOTE — Telephone Encounter (Signed)
Spoke with pt, she called to let dr Johnsie Cancel know, yesterday around 5:30 pm she developed a tightness in her chest and pain in her jaw. She took 1 NTG and the discomfort went away. She feels fine today. This is the first time she has taken NTG in a couple months. Will make dr Johnsie Cancel aware.

## 2016-09-25 ENCOUNTER — Encounter (HOSPITAL_COMMUNITY): Payer: Self-pay

## 2016-09-25 ENCOUNTER — Other Ambulatory Visit: Payer: Self-pay | Admitting: Cardiovascular Disease

## 2016-09-25 DIAGNOSIS — R079 Chest pain, unspecified: Secondary | ICD-10-CM

## 2016-09-28 ENCOUNTER — Encounter (HOSPITAL_COMMUNITY): Payer: Self-pay

## 2016-09-30 ENCOUNTER — Encounter (HOSPITAL_COMMUNITY): Payer: Self-pay

## 2016-10-02 ENCOUNTER — Encounter (HOSPITAL_COMMUNITY): Payer: Self-pay

## 2016-10-05 ENCOUNTER — Other Ambulatory Visit: Payer: 59 | Admitting: *Deleted

## 2016-10-05 ENCOUNTER — Encounter (HOSPITAL_COMMUNITY): Payer: Self-pay

## 2016-10-05 DIAGNOSIS — Z01812 Encounter for preprocedural laboratory examination: Secondary | ICD-10-CM

## 2016-10-05 LAB — CBC WITH DIFFERENTIAL/PLATELET
BASOS: 1 %
Basophils Absolute: 0.1 10*3/uL (ref 0.0–0.2)
EOS (ABSOLUTE): 0.1 10*3/uL (ref 0.0–0.4)
EOS: 1 %
HEMATOCRIT: 39.3 % (ref 34.0–46.6)
HEMOGLOBIN: 12.9 g/dL (ref 11.1–15.9)
IMMATURE GRANULOCYTES: 0 %
Immature Grans (Abs): 0 10*3/uL (ref 0.0–0.1)
Lymphocytes Absolute: 2.1 10*3/uL (ref 0.7–3.1)
Lymphs: 36 %
MCH: 27.9 pg (ref 26.6–33.0)
MCHC: 32.8 g/dL (ref 31.5–35.7)
MCV: 85 fL (ref 79–97)
MONOCYTES: 9 %
MONOS ABS: 0.5 10*3/uL (ref 0.1–0.9)
NEUTROS PCT: 53 %
Neutrophils Absolute: 3.1 10*3/uL (ref 1.4–7.0)
Platelets: 294 10*3/uL (ref 150–379)
RBC: 4.62 x10E6/uL (ref 3.77–5.28)
RDW: 13.6 % (ref 12.3–15.4)
WBC: 5.9 10*3/uL (ref 3.4–10.8)

## 2016-10-05 LAB — BASIC METABOLIC PANEL
BUN / CREAT RATIO: 18 (ref 12–28)
BUN: 12 mg/dL (ref 8–27)
CO2: 23 mmol/L (ref 18–29)
CREATININE: 0.65 mg/dL (ref 0.57–1.00)
Calcium: 9.7 mg/dL (ref 8.7–10.3)
Chloride: 99 mmol/L (ref 96–106)
GFR calc Af Amer: 109 mL/min/{1.73_m2} (ref >59)
GFR, EST NON AFRICAN AMERICAN: 95 mL/min/{1.73_m2} (ref >59)
Glucose: 98 mg/dL (ref 65–99)
Potassium: 4.4 mmol/L (ref 3.5–5.2)
SODIUM: 137 mmol/L (ref 134–144)

## 2016-10-05 LAB — PROTIME-INR
INR: 1 (ref 0.8–1.2)
PROTHROMBIN TIME: 10.9 s (ref 9.1–12.0)

## 2016-10-05 MED FILL — METOPROLOL TARTRATE 25 MG T: 25 | 90 days supply | Qty: 90 | Fill #0

## 2016-10-05 MED FILL — FENOFIBRATE 160 MG TABLET: 160 | 90 days supply | Qty: 90 | Fill #0

## 2016-10-07 ENCOUNTER — Encounter (HOSPITAL_COMMUNITY): Payer: Self-pay

## 2016-10-08 ENCOUNTER — Encounter (HOSPITAL_COMMUNITY): Payer: Self-pay | Admitting: Cardiology

## 2016-10-08 ENCOUNTER — Encounter (HOSPITAL_COMMUNITY)
Admission: RE | Disposition: A | Discharge status: Home / Self Care | Payer: Self-pay | Source: Ambulatory Visit | Attending: Cardiology

## 2016-10-08 ENCOUNTER — Ambulatory Visit (HOSPITAL_COMMUNITY)
Admission: RE | Admit: 2016-10-08 | Discharge: 2016-10-08 | Disposition: A | Discharge status: Home / Self Care | Payer: 59 | Source: Ambulatory Visit | Attending: Cardiology | Admitting: Cardiology

## 2016-10-08 DIAGNOSIS — G8929 Other chronic pain: Secondary | ICD-10-CM | POA: Diagnosis not present

## 2016-10-08 DIAGNOSIS — Z7901 Long term (current) use of anticoagulants: Secondary | ICD-10-CM | POA: Diagnosis not present

## 2016-10-08 DIAGNOSIS — Z7982 Long term (current) use of aspirin: Secondary | ICD-10-CM | POA: Diagnosis not present

## 2016-10-08 DIAGNOSIS — I209 Angina pectoris, unspecified: Secondary | ICD-10-CM | POA: Diagnosis present

## 2016-10-08 DIAGNOSIS — E039 Hypothyroidism, unspecified: Secondary | ICD-10-CM | POA: Diagnosis not present

## 2016-10-08 DIAGNOSIS — I255 Ischemic cardiomyopathy: Secondary | ICD-10-CM | POA: Diagnosis not present

## 2016-10-08 DIAGNOSIS — I341 Nonrheumatic mitral (valve) prolapse: Secondary | ICD-10-CM | POA: Diagnosis not present

## 2016-10-08 DIAGNOSIS — Z8249 Family history of ischemic heart disease and other diseases of the circulatory system: Secondary | ICD-10-CM | POA: Diagnosis not present

## 2016-10-08 DIAGNOSIS — E876 Hypokalemia: Secondary | ICD-10-CM | POA: Diagnosis not present

## 2016-10-08 DIAGNOSIS — I2582 Chronic total occlusion of coronary artery: Secondary | ICD-10-CM | POA: Insufficient documentation

## 2016-10-08 DIAGNOSIS — I25119 Atherosclerotic heart disease of native coronary artery with unspecified angina pectoris: Secondary | ICD-10-CM

## 2016-10-08 DIAGNOSIS — M170 Bilateral primary osteoarthritis of knee: Secondary | ICD-10-CM | POA: Insufficient documentation

## 2016-10-08 DIAGNOSIS — Z955 Presence of coronary angioplasty implant and graft: Secondary | ICD-10-CM | POA: Insufficient documentation

## 2016-10-08 DIAGNOSIS — E7800 Pure hypercholesterolemia, unspecified: Secondary | ICD-10-CM | POA: Diagnosis not present

## 2016-10-08 DIAGNOSIS — Z86718 Personal history of other venous thrombosis and embolism: Secondary | ICD-10-CM | POA: Insufficient documentation

## 2016-10-08 DIAGNOSIS — R079 Chest pain, unspecified: Secondary | ICD-10-CM

## 2016-10-08 DIAGNOSIS — I252 Old myocardial infarction: Secondary | ICD-10-CM | POA: Diagnosis not present

## 2016-10-08 DIAGNOSIS — Z7902 Long term (current) use of antithrombotics/antiplatelets: Secondary | ICD-10-CM | POA: Diagnosis not present

## 2016-10-08 DIAGNOSIS — Z86711 Personal history of pulmonary embolism: Secondary | ICD-10-CM | POA: Diagnosis not present

## 2016-10-08 DIAGNOSIS — G43909 Migraine, unspecified, not intractable, without status migrainosus: Secondary | ICD-10-CM | POA: Insufficient documentation

## 2016-10-08 DIAGNOSIS — I2510 Atherosclerotic heart disease of native coronary artery without angina pectoris: Secondary | ICD-10-CM | POA: Diagnosis present

## 2016-10-08 DIAGNOSIS — G2581 Restless legs syndrome: Secondary | ICD-10-CM | POA: Diagnosis not present

## 2016-10-08 DIAGNOSIS — M549 Dorsalgia, unspecified: Secondary | ICD-10-CM | POA: Diagnosis not present

## 2016-10-08 DIAGNOSIS — R7989 Other specified abnormal findings of blood chemistry: Secondary | ICD-10-CM | POA: Insufficient documentation

## 2016-10-08 HISTORY — PX: LEFT HEART CATH AND CORONARY ANGIOGRAPHY: CATH118249

## 2016-10-08 SURGERY — LEFT HEART CATH AND CORONARY ANGIOGRAPHY
Anesthesia: LOCAL

## 2016-10-08 MED ORDER — SODIUM CHLORIDE 0.9 % IV SOLN: 250.0000 mL | INTRAVENOUS | Status: DC | PRN

## 2016-10-08 MED ORDER — VERAPAMIL HCL 2.5 MG/ML IV SOLN
INTRAVENOUS | Status: DC | PRN
  Administered 2016-10-08: 10 mL via INTRA_ARTERIAL

## 2016-10-08 MED ORDER — SODIUM CHLORIDE 0.9% FLUSH: 3.0000 mL | INTRAVENOUS | Status: DC | PRN

## 2016-10-08 MED ORDER — HEPARIN SODIUM (PORCINE) 1000 UNIT/ML IJ SOLN
INTRAMUSCULAR | Status: DC | PRN
Start: 2016-10-08 — End: 2016-10-08
  Administered 2016-10-08: 4000 [IU] via INTRAVENOUS

## 2016-10-08 MED ORDER — LIDOCAINE HCL (PF) 1 % IJ SOLN
INTRAMUSCULAR | Status: AC
  Filled 2016-10-08: qty 30

## 2016-10-08 MED ORDER — MIDAZOLAM HCL 2 MG/2ML IJ SOLN
INTRAMUSCULAR | Status: DC | PRN
  Administered 2016-10-08: 1 mg via INTRAVENOUS

## 2016-10-08 MED ORDER — ASPIRIN 81 MG PO CHEW: 81.0000 mg | CHEWABLE_TABLET | ORAL | Status: DC

## 2016-10-08 MED ORDER — VERAPAMIL HCL 2.5 MG/ML IV SOLN
INTRAVENOUS | Status: AC
  Filled 2016-10-08: qty 2

## 2016-10-08 MED ORDER — SODIUM CHLORIDE 0.9% FLUSH: 3.0000 mL | Freq: Two times a day (BID) | INTRAVENOUS | Status: DC

## 2016-10-08 MED ORDER — LIDOCAINE HCL (PF) 1 % IJ SOLN
INTRAMUSCULAR | Status: DC | PRN
  Administered 2016-10-08: 2 mL via INTRADERMAL

## 2016-10-08 MED ORDER — SODIUM CHLORIDE 0.9 % WEIGHT BASED INFUSION
3.0000 mL/kg/h | INTRAVENOUS | Status: AC
  Administered 2016-10-08: 3 mL/kg/h via INTRAVENOUS

## 2016-10-08 MED ORDER — FENTANYL CITRATE (PF) 100 MCG/2ML IJ SOLN
INTRAMUSCULAR | Status: AC
  Filled 2016-10-08: qty 2

## 2016-10-08 MED ORDER — IOPAMIDOL (ISOVUE-370) INJECTION 76%
INTRAVENOUS | Status: DC | PRN
  Administered 2016-10-08: 85 mL via INTRA_ARTERIAL

## 2016-10-08 MED ORDER — HEPARIN SODIUM (PORCINE) 1000 UNIT/ML IJ SOLN
INTRAMUSCULAR | Status: AC
  Filled 2016-10-08: qty 1

## 2016-10-08 MED ORDER — MIDAZOLAM HCL 2 MG/2ML IJ SOLN
INTRAMUSCULAR | Status: AC
  Filled 2016-10-08: qty 2

## 2016-10-08 MED ORDER — HEPARIN (PORCINE) IN NACL 2-0.9 UNIT/ML-% IJ SOLN
INTRAMUSCULAR | Status: AC
  Filled 2016-10-08: qty 1000

## 2016-10-08 MED ORDER — SODIUM CHLORIDE 0.9 % WEIGHT BASED INFUSION: 1.0000 mL/kg/h | INTRAVENOUS | Status: DC

## 2016-10-08 MED ORDER — IOPAMIDOL (ISOVUE-370) INJECTION 76%
INTRAVENOUS | Status: AC
  Filled 2016-10-08: qty 100

## 2016-10-08 MED ORDER — HEPARIN (PORCINE) IN NACL 2-0.9 UNIT/ML-% IJ SOLN
INTRAMUSCULAR | Status: DC | PRN
  Administered 2016-10-08: 1000 mL

## 2016-10-08 MED ORDER — FENTANYL CITRATE (PF) 100 MCG/2ML IJ SOLN
INTRAMUSCULAR | Status: DC | PRN
  Administered 2016-10-08: 25 ug via INTRAVENOUS

## 2016-10-08 SURGICAL SUPPLY — 10 items
CATH 5FR JL3.5 JR4 ANG PIG MP (CATHETERS) ×1 IMPLANT
DEVICE RAD COMP TR BAND LRG (VASCULAR PRODUCTS) ×1 IMPLANT
GLIDESHEATH SLEND SS 6F .021 (SHEATH) ×1 IMPLANT
GUIDEWIRE INQWIRE 1.5J.035X260 (WIRE) IMPLANT
INQWIRE 1.5J .035X260CM (WIRE) ×2
KIT HEART LEFT (KITS) ×2 IMPLANT
PACK CARDIAC CATHETERIZATION (CUSTOM PROCEDURE TRAY) ×2 IMPLANT
SYR MEDRAD MARK V 150ML (SYRINGE) ×2 IMPLANT
TRANSDUCER W/STOPCOCK (MISCELLANEOUS) ×2 IMPLANT
TUBING CIL FLEX 10 FLL-RA (TUBING) ×2 IMPLANT

## 2016-10-08 NOTE — Interval H&P Note (Signed)
History and Physical Interval Note:  10/08/2016 7:26 AM  [name]  has presented today for surgery, with the diagnosis of cp  The various methods of treatment have been discussed with the patient and family. After consideration of risks, benefits and other options for treatment, the patient has consented to  Procedure(s): Left Heart Cath and Coronary Angiography (N/A) as a surgical intervention .  [name] history has been reviewed, patient examined, no change in status, stable for surgery.  I have reviewed the patient's chart and labs.  Questions were answered to the patient's satisfaction.   Cath Lab Visit (complete for each Cath Lab visit)  Clinical Evaluation Leading to the Procedure:   ACS: Yes.    Non-ACS:    Anginal Classification: CCS III  Anti-ischemic medical therapy: Maximal Therapy (2 or more classes of medications)  Non-Invasive Test Results: No non-invasive testing performed  Prior CABG: No previous CABG        [facility] 10/08/2016 7:26 AM

## 2016-10-08 NOTE — H&P (Signed)
Brittany Mason Date of Birth: [birth year] Medical Record #[id number]  PCP:  Gennette Pac, MD     Cardiologist:  Gillian Shields       Chief Complaint  Patient presents with  . Cardiomyopathy    History of Present Illness: Brittany Mason is a 66 y.o. female who presents today for cardiac cath procedure.    She has a history of pulmonary embolism on prior chronic anticoagulation (in the setting of HRT), mitral valve prolapse, hypothyroidism, hypercholesterolemia, and restless leg syndrome who presented in June 2017 with VF cardiac arrest at home - with 16 minutes of down-time. Cath on 6/14 showing total occlusion of prox circumflex with collaterals. (conservative management favored at that time due to cooling protocol and further neurologic recovery). She was not seen by EP. She had been seen here back in March with atypical chest pain - had a normal stress test back in March of 2017. Strong + FH for CAD.  She was discharged 11/25/2015.  Subsequently had stenting of the totally occluded proximal circumflex by Dr Martinique On 12/04/15 with continued occlusion of the distal vessel fed by collaterals.              She was seen by Dr. Johnsie Cancel at the end of March and was doing well. 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 She describes this as a pressure and pain in her mid chest radiating into her right jaw. Not associated with exertion. Relieved with sl Ntg. Does not recall having these symptoms before but really has no recollection of her initial presentation in 2017.   Still working at [employer] may need to start bringing [place] on line soon        Past Medical History:  Diagnosis Date  . Acute pulmonary embolism ([facility]) 06/20/2015  . Arthritis    osteoarthritis-knees. Chronic back pain  . Basal cell carcinoma of right shoulder    "burned off"  . Chronic SI joint pain   . DVT (deep venous thrombosis) ([facility]) 04/2013   left lower  leg, resulted in Pulmonary emboli on hormone therapy  . Dyspnea on exertion   . Hypercholesterolemia   . Hypothyroidism   . Migraine    "< once/month" (12/04/2015)  . MVP (mitral valve prolapse)    asymptomatic  . Myocardial infarction 11/19/2015   cardiac arrest  . PE (pulmonary embolism) Apr 18, 2013   tx. -using Xarelto now, no further lung problems, denies SOB on 01-02-14  . PONV (postoperative nausea and vomiting)   . RLS (restless legs syndrome)     Past Surgical History:  Procedure Laterality Date  . ANTERIOR CRUCIATE LIGAMENT REPAIR Left 1976; 1981   "open"  . CARDIAC CATHETERIZATION N/A 11/20/2015   Procedure: Left Heart Cath and Coronary Angiography;  Surgeon: Belva Crome, MD;  Location: [facility];  Service: Cardiovascular;  Laterality: N/A;  . CARDIAC CATHETERIZATION N/A 12/04/2015   Procedure: Left Heart Cath and Coronary Angiography;  Surgeon: Eleen Litz M Martinique, MD;  Location: [facility];  Service: Cardiovascular;  Laterality: N/A;  . CARDIAC CATHETERIZATION  12/04/2015   Procedure: Coronary Stent Intervention;  Surgeon: Johnesha Acheampong M Martinique, MD;  Location: [facility];  Service: Cardiovascular;;  . COLONOSCOPY  09/28/2011   Procedure: COLONOSCOPY;  Surgeon: Juanita Craver, MD;  Location: [facility];  Service: Endoscopy;  Laterality: N/A;  . JOINT REPLACEMENT    . TOTAL ABDOMINAL HYSTERECTOMY  1998  . TOTAL KNEE  ARTHROPLASTY Left 01/08/2014   Procedure: LEFT TOTAL KNEE ARTHROPLASTY;  Surgeon: Gearlean Alf, MD;  Location: [facility];  Service: Orthopedics;  Laterality: Left;     Medications:       Current Outpatient Prescriptions  Medication Sig Dispense Refill  . aspirin 81 MG tablet Take 81 mg by mouth every morning.     . butalbital-acetaminophen-caffeine (FIORICET) 50-325-40 MG tablet Take 1-2 tablets by mouth every 6 (six) hours as needed for headache. 20 tablet 0  . clopidogrel (PLAVIX) 75 MG tablet Take 1 tablet (75 mg  total) by mouth daily. 90 tablet 2  . cyclobenzaprine (FLEXERIL) 5 MG tablet Take 5 mg by mouth 3 (three) times daily as needed for muscle spasms.    Marland Kitchen escitalopram (LEXAPRO) 20 MG tablet Take 20 mg by mouth every morning.     . fenofibrate 160 MG tablet Take 160 mg by mouth at bedtime.     Marland Kitchen levothyroxine (SYNTHROID, LEVOTHROID) 150 MCG tablet Take 150 mcg by mouth daily before breakfast.     . metoprolol tartrate (LOPRESSOR) 25 MG tablet Take 0.5 tablets (12.5 mg total) by mouth 2 (two) times daily. 90 tablet 3  . morphine (MSIR) 30 MG tablet Take 30 mg by mouth every 12 (twelve) hours.    . nitroGLYCERIN (NITROSTAT) 0.4 MG SL tablet Place 1 tablet (0.4 mg total) under the tongue every 5 (five) minutes as needed for chest pain. 25 tablet 3  . omega-3 acid ethyl esters (LOVAZA) 1 G capsule Take 2 g by mouth 2 (two) times daily.    Marland Kitchen OVER THE COUNTER MEDICATION Take 2 capsules by mouth daily. "Relizen" herbal supplement for hormone replacement     . rOPINIRole (REQUIP) 0.5 MG tablet Take 0.25 mg by mouth every evening.     . simvastatin (ZOCOR) 20 MG tablet Take 1 tablet (20 mg total) by mouth every other day. alternating with 40 mg by mouth. 45 tablet 3  . simvastatin (ZOCOR) 40 MG tablet Take 1 tablet (40 mg total) by mouth every other day. Alternating with 20 mg by mouth every other day. 45 tablet 3   No current facility-administered medications for this visit.     Allergies:      Allergies  Allergen Reactions  . Codeine Nausea And Vomiting    Takes hydrocodone; if takes doses too close together, states "violently throws up"  . Erythromycin Hives and Itching    Social History: The patient  reports that she has never smoked. She has never used smokeless tobacco. She reports that she drinks alcohol. She reports that she does not use drugs.   Family History: [name] family history includes Breast cancer in her maternal aunt and paternal aunt; Cancer in her  maternal aunt; Congestive Heart Failure in her mother; Coronary artery disease in her father; Hypertension in her father and mother; Stroke in her brother.   Review of Systems: Please see the history of present illness.   Otherwise, the review of systems is positive for none.   All other systems are reviewed and negative.   Physical Exam: VS:  BP 120/60   Pulse (!) 53   Ht 5\' 3"  (1.6 m)   Wt 176 lb 6.4 oz (80 kg)   SpO2 98%   BMI 31.25 kg/m  .  BMI Body mass index is 31.25 kg/m.     Wt Readings from Last 3 Encounters:  08/31/16 176 lb 6.4 oz (80 kg)  05/11/16 175 lb 3.2 oz (79.5  kg)  04/02/16 174 lb 12.8 oz (79.3 kg)    General: Pleasant. Well developed, well nourished and in no acute distress.   HEENT: Normal.  Neck: Supple, no JVD, carotid bruits, or masses noted.  Cardiac: Regular rate and rhythm. No murmurs, rubs, or gallops. No edema. Pulses 2+ and equal. Good radial pulse Respiratory:  Lungs are clear to auscultation bilaterally with normal work of breathing.  GI: Soft and nontender.  MS: No deformity or atrophy. Gait and ROM intact.  Skin: Warm and dry. Color is normal.  Neuro:  Strength and sensation are intact and no gross focal deficits noted.  Psych: Alert, appropriate and with normal affect.   LABORATORY DATA:  EKG:  EKG is  ordered today. NSR with frequent PVCs and bigeminy. Prolonged QT. I have personally reviewed and interpreted this study.   Lab Results  Component Value Date   WBC 5.9 10/05/2016   HGB 11.2 (L) 12/05/2015   HCT 39.3 10/05/2016   PLT 294 10/05/2016   GLUCOSE 98 10/05/2016   CHOL 177 09/08/2016   TRIG 83 09/08/2016   HDL 64 09/08/2016   LDLCALC 96 09/08/2016   ALT 22 09/08/2016   AST 33 09/08/2016   NA 137 10/05/2016   K 4.4 10/05/2016   CL 99 10/05/2016   CREATININE 0.65 10/05/2016   BUN 12 10/05/2016   CO2 23 10/05/2016   TSH 0.39 (L) 01/20/2016   INR 1.0 10/05/2016   HGBA1C 5.7 (H) 11/19/2015    Other Studies  Reviewed Today:     Cardiac Cath/PCI Conclusion from 12/04/15    Prox RCA to Mid RCA lesion, 30% stenosed.  RPDA lesion, 60% stenosed.  Prox Cx to Mid Cx lesion, 100% stenosed.  There is mild left ventricular systolic dysfunction.  Prox Cx lesion, 99% stenosed. Post intervention, there is a 0% residual stenosis.  1. Single vessel obstructive CAD 2. Mild LV dysfunction. 3. Low LVEDP 4. Successful stenting of the proximal LCx into a large bifurcating OM1. The distal LCx is still occluded with right to left collaterals. This branch is relatively small.   Plan: DAPT for one year. Will stop Lasix. Given improvement in EF would not recommend Life Vest at this point. Anticipate DC in am.      Cardiac Catheterization: 11/20/2015  Prox RCA to Dist RCA lesion, 45% stenosed.  RPDA lesion, 65% stenosed.  Prox Cx to Mid Cx lesion, 100% stenosed.   Total occlusion (Acute vs Chronic) of the proximal circumflex with collaterals from left to left and right to left. The collaterals appear to be meager however, the patient is on the cooling protocol and I suspect that collateralization will significantly improved once the patient is back to body temperature. If angina develops post arrest, recanalization of the circumflex coronary artery would be reasonable.  Diffuse moderate stenosis in a large PDA, up to 70% obstruction. Relatively small distribution LAD that reaches, but does not wrap around, the left ventricular apex. No significant obstruction is noted in the LAD.  Mid inferior wall moderate hypokinesis. Estimated ejection fraction 45-50%. Marked elevation in left ventricular end-diastolic pressure of 40 mmHg secondary to acute ischemia and cooling.   Recommendations:  Conservative medical management for the time being.  I would add Plavix to the patient's medical regimen.  Should the patient have a significant neurological recovery, angina/ongoing evidence of ischemia  could be treated with circumflex PCI. We chose against PCI today given cooling, vasoconstriction related to cooling, and clinical stability at the present  time without evidence of ongoing ischemia.   Echocardiogram: 11/20/2015 Study Conclusions  - Left ventricle: The cavity size was normal. There was mild focal  basal hypertrophy of the septum. Systolic function was moderately  reduced. The estimated ejection fraction was in the range of 35%  to 40%.Diffuse hypokinesis. There is akinesis of the  mid-apicalinferolateral and inferior myocardium. Doppler  parameters are consistent with abnormal left ventricular  relaxation (grade 1 diastolic dysfunction). - Aortic valve: Trileaflet; mildly thickened, mildly calcified  leaflets. There was mild regurgitation directed eccentrically in  the LVOT. - Aorta: Aortic root dimension: 38 mm (ED). - Ascending aorta: The ascending aorta was mildly dilated. - Mitral valve: Calcified annulus. - Right ventricle: The cavity size was moderately dilated. Wall  thickness was normal. Systolic function was moderately reduced.  Assessment/Plan: 1. CAD. Now with recurrent angina at rest. Responsive to NTG. History of Vfib arrest.  -Cath on 6/14 showed total occlusion of prox circumflex (conservative management favored at that time due to cooling protocol and to assess for further neurologic recovery). She is now s/p PCI to the LCX note indicated distal circumflex still occluded with collaterals.  Proximal vessel was stented into a large bifurcating OM1 . Mild LV dysfunction noted at time of repeat cath with EF of 45 to 50%. With recurrent anginal symptoms recommend proceeding directly to cardiac cath. The procedure and risks were reviewed including but not limited to death, myocardial infarction, stroke, arrythmias, bleeding, transfusion, emergency surgery, dye allergy, or renal dysfunction. The patient voices understanding and is agreeable to  proceed.   2. Ischemic CM - mild LV dysfunction noted on repeat cath.  Doing very well clinically.  Seen by EP and no AICD indicated  EF 45-50% by echo 04/10/16    3. Hypokalemia - potassium 4.4   4. HLD- cant tolerate crestor or lipitor has tolerated zocor  And taking CoQ  f/u lipid clinic with labs April   5. Elevated LFTs- improved on last recheck.    6. Short term memory issues- greatly improved.   7. PVCs - seen by Dr Curt Bears continue beta blocker

## 2016-10-08 NOTE — Discharge Instructions (Signed)
Radial Site Care °Refer to this sheet in the next few weeks. These instructions provide you with information about caring for yourself after your procedure. Your health care provider may also give you more specific instructions. Your treatment has been planned according to current medical practices, but problems sometimes occur. Call your health care provider if you have any problems or questions after your procedure. °What can I expect after the procedure? °After your procedure, it is typical to have the following: °· Bruising at the radial site that usually fades within 1-2 weeks. °· Blood collecting in the tissue (hematoma) that may be painful to the touch. It should usually decrease in size and tenderness within 1-2 weeks. °Follow these instructions at home: °· Take medicines only as directed by your health care provider. °· You may shower 24-48 hours after the procedure or as directed by your health care provider. Remove the bandage (dressing) and gently wash the site with plain soap and water. Pat the area dry with a clean towel. Do not rub the site, because this may cause bleeding. °· Do not take baths, swim, or use a hot tub until your health care provider approves. °· Check your insertion site every day for redness, swelling, or drainage. °· Do not apply powder or lotion to the site. °· Do not flex or bend the affected arm for 24 hours or as directed by your health care provider. °· Do not push or pull heavy objects with the affected arm for 24 hours or as directed by your health care provider. °· Do not lift over 10 lb (4.5 kg) for 5 days after your procedure or as directed by your health care provider. °· Ask your health care provider when it is okay to: °¨ Return to work or school. °¨ Resume usual physical activities or sports. °¨ Resume sexual activity. °· Do not drive home if you are discharged the same day as the procedure. Have someone else drive you. °· You may drive 24 hours after the procedure  unless otherwise instructed by your health care provider. °· Do not operate machinery or power tools for 24 hours after the procedure. °· If your procedure was done as an outpatient procedure, which means that you went home the same day as your procedure, a responsible adult should be with you for the first 24 hours after you arrive home. °· Keep all follow-up visits as directed by your health care provider. This is important. °Contact a health care provider if: °· You have a fever. °· You have chills. °· You have increased bleeding from the radial site. Hold pressure on the site. °Get help right away if: °· You have unusual pain at the radial site. °· You have redness, warmth, or swelling at the radial site. °· You have drainage (other than a small amount of blood on the dressing) from the radial site. °· The radial site is bleeding, and the bleeding does not stop after 30 minutes of holding steady pressure on the site. °· Your arm or hand becomes pale, cool, tingly, or numb. °This information is not intended to replace advice given to you by your health care provider. Make sure you discuss any questions you have with your health care provider. °Document Released: 06/27/2010 Document Revised: 10/31/2015 Document Reviewed: 12/11/2013 °Elsevier Interactive Patient Education © 2017 [employer] ° °

## 2016-10-09 ENCOUNTER — Encounter (HOSPITAL_COMMUNITY): Payer: Self-pay

## 2016-10-12 ENCOUNTER — Encounter (HOSPITAL_COMMUNITY): Payer: Self-pay

## 2016-10-14 ENCOUNTER — Encounter (HOSPITAL_COMMUNITY): Payer: Self-pay

## 2016-10-16 ENCOUNTER — Encounter (HOSPITAL_COMMUNITY): Payer: Self-pay

## 2016-10-19 ENCOUNTER — Encounter (HOSPITAL_COMMUNITY): Payer: Self-pay

## 2016-10-21 ENCOUNTER — Encounter (HOSPITAL_COMMUNITY): Payer: Self-pay

## 2016-10-22 MED FILL — BUTALB-ACETAMIN-CAFF 50-325: 50-325-40 | 3 days supply | Qty: 20 | Fill #0

## 2016-10-22 MED FILL — ESCITALOPRAM 20 MG TABLET: 20 | 90 days supply | Qty: 90 | Fill #0

## 2016-10-22 MED FILL — rOPINIRole HCL 0.5 MG TABS: 0.5 | 90 days supply | Qty: 90 | Fill #0

## 2016-10-23 ENCOUNTER — Telehealth: Payer: Self-pay | Admitting: Cardiovascular Disease

## 2016-10-23 ENCOUNTER — Encounter (HOSPITAL_COMMUNITY): Payer: Self-pay

## 2016-10-23 NOTE — Telephone Encounter (Signed)
New Message  Pt call requesting to speak with RN. Pt states she was told not to schedule and surgeries for a year after her heart attack. Pt stats she is to have a colonoscopy and would like to know when will she be able to schedule this procedure. Please call back to discuss

## 2016-10-23 NOTE — Telephone Encounter (Addendum)
Informed patient that she should be fine having a colonoscopy since her heart attach was last June. Encouraged patient to talk with her GI doctor about any medications that they might want her to hold. Patient verbalized understanding and will follow-up with GI. Will route to Dr. Johnsie Cancel for further advisement.

## 2016-10-26 ENCOUNTER — Encounter (HOSPITAL_COMMUNITY): Payer: Self-pay

## 2016-10-27 DIAGNOSIS — Z1211 Encounter for screening for malignant neoplasm of colon: Secondary | ICD-10-CM | POA: Diagnosis not present

## 2016-10-27 DIAGNOSIS — Z800 Family history of malignant neoplasm of digestive organs: Secondary | ICD-10-CM | POA: Diagnosis not present

## 2016-10-27 DIAGNOSIS — Z86010 Personal history of colonic polyps: Secondary | ICD-10-CM | POA: Diagnosis not present

## 2016-10-27 DIAGNOSIS — K5904 Chronic idiopathic constipation: Secondary | ICD-10-CM | POA: Diagnosis not present

## 2016-10-28 ENCOUNTER — Other Ambulatory Visit: Payer: Self-pay | Admitting: Gastroenterology

## 2016-10-28 ENCOUNTER — Encounter (HOSPITAL_COMMUNITY): Payer: Self-pay

## 2016-10-30 ENCOUNTER — Encounter (HOSPITAL_COMMUNITY): Payer: Self-pay

## 2016-11-03 NOTE — Telephone Encounter (Signed)
Called patient and informed her of Dr. Kyla Balzarine message. Patient verbalized understanding.

## 2016-11-03 NOTE — Telephone Encounter (Signed)
Ok to have colonoscopy 

## 2016-11-04 ENCOUNTER — Encounter (HOSPITAL_COMMUNITY): Payer: Self-pay

## 2016-11-09 DIAGNOSIS — Z1231 Encounter for screening mammogram for malignant neoplasm of breast: Secondary | ICD-10-CM | POA: Diagnosis not present

## 2016-11-11 MED FILL — OMEGA-3 ETHYL ESTERS 1 GM C: 1 | 90 days supply | Qty: 360 | Fill #0

## 2016-11-11 MED FILL — MORPHINE SULF ER 30 MG TAB: 30 | 30 days supply | Qty: 60 | Fill #0

## 2016-11-18 ENCOUNTER — Other Ambulatory Visit: Payer: 59 | Admitting: *Deleted

## 2016-11-18 DIAGNOSIS — E7800 Pure hypercholesterolemia, unspecified: Secondary | ICD-10-CM

## 2016-11-18 LAB — LIPID PANEL
CHOL/HDL RATIO: 2.5 ratio (ref 0.0–4.4)
CHOLESTEROL TOTAL: 138 mg/dL (ref 100–199)
HDL: 56 mg/dL (ref >39)
LDL CALC: 66 mg/dL (ref 0–99)
TRIGLYCERIDES: 79 mg/dL (ref 0–149)
VLDL Cholesterol Cal: 16 mg/dL (ref 5–40)

## 2016-11-27 MED FILL — EZETIMIBE 10 MG TABLET: 10 | 90 days supply | Qty: 90 | Fill #0

## 2016-11-30 MED FILL — CLOPIDOGREL 75 MG TABLET: 75 | 90 days supply | Qty: 90 | Fill #0

## 2016-12-14 MED FILL — ISOSORBIDE MN ER 30 MG TAB: 30 | 90 days supply | Qty: 90 | Fill #1

## 2016-12-21 MED FILL — SYNTHROID 150 MCG TABLET: 150 | 90 days supply | Qty: 90 | Fill #1

## 2016-12-23 MED FILL — MORPHINE SULF 30 MG TAB SA: 30 | 30 days supply | Qty: 60 | Fill #0

## 2016-12-24 ENCOUNTER — Telehealth: Payer: Self-pay

## 2016-12-24 NOTE — Telephone Encounter (Signed)
Request for surgical clearance:  1. What type of procedure is being performed? colonoscopy  2. When is this surgery scheduled? 01/05/17  3. Are there any medications that need to be held prior to surgery and how long?Plavix and Aspirin   4. Name of physician performing surgery? Dr. Collene Mares   5. What is your office phone and fax number? Phone [phone]  Fax [phone]

## 2016-12-28 ENCOUNTER — Encounter (HOSPITAL_COMMUNITY): Payer: Self-pay | Admitting: *Deleted

## 2016-12-30 MED FILL — FENOFIBRATE 160 MG TABLET: 160 | 90 days supply | Qty: 90 | Fill #1

## 2016-12-30 MED FILL — GAVILYTE-G SOLUTION: 236 | 2 days supply | Qty: 4000 | Fill #0

## 2016-12-30 MED FILL — AMOXICILLIN 500 MG CAPSULE: 500 | 3 days supply | Qty: 12 | Fill #0

## 2016-12-30 MED FILL — SIMVASTATIN 40 MG TABLET: 40 | 90 days supply | Qty: 90 | Fill #0

## 2016-12-31 NOTE — Telephone Encounter (Signed)
Dr. Johnsie Cancel already cleared patient to have colonoscopy. Since Dr. Johnsie Cancel is out of the office this week, consulted DOD, Dr. Angelena Form, about holding plavix and ASA. He stated it was fine to hold plavix and ASA if needed. Will fax to number provided, with previous phone note about clearance.

## 2017-01-05 ENCOUNTER — Ambulatory Visit (HOSPITAL_COMMUNITY)
Admission: RE | Admit: 2017-01-05 | Discharge: 2017-01-05 | Disposition: A | Discharge status: Home / Self Care | Payer: 59 | Source: Ambulatory Visit | Attending: Gastroenterology | Admitting: Gastroenterology

## 2017-01-05 ENCOUNTER — Encounter (HOSPITAL_COMMUNITY)
Admission: RE | Disposition: A | Discharge status: Home / Self Care | Payer: Self-pay | Source: Ambulatory Visit | Attending: Gastroenterology

## 2017-01-05 ENCOUNTER — Ambulatory Visit (HOSPITAL_COMMUNITY): Payer: 59 | Admitting: Anesthesiology

## 2017-01-05 ENCOUNTER — Encounter (HOSPITAL_COMMUNITY): Payer: Self-pay | Admitting: *Deleted

## 2017-01-05 DIAGNOSIS — Z8674 Personal history of sudden cardiac arrest: Secondary | ICD-10-CM | POA: Diagnosis not present

## 2017-01-05 DIAGNOSIS — D122 Benign neoplasm of ascending colon: Secondary | ICD-10-CM | POA: Diagnosis not present

## 2017-01-05 DIAGNOSIS — M170 Bilateral primary osteoarthritis of knee: Secondary | ICD-10-CM | POA: Insufficient documentation

## 2017-01-05 DIAGNOSIS — Z85828 Personal history of other malignant neoplasm of skin: Secondary | ICD-10-CM | POA: Diagnosis not present

## 2017-01-05 DIAGNOSIS — K219 Gastro-esophageal reflux disease without esophagitis: Secondary | ICD-10-CM | POA: Diagnosis not present

## 2017-01-05 DIAGNOSIS — I252 Old myocardial infarction: Secondary | ICD-10-CM | POA: Diagnosis not present

## 2017-01-05 DIAGNOSIS — Z8249 Family history of ischemic heart disease and other diseases of the circulatory system: Secondary | ICD-10-CM | POA: Diagnosis not present

## 2017-01-05 DIAGNOSIS — Z90710 Acquired absence of both cervix and uterus: Secondary | ICD-10-CM | POA: Diagnosis not present

## 2017-01-05 DIAGNOSIS — K635 Polyp of colon: Secondary | ICD-10-CM | POA: Diagnosis not present

## 2017-01-05 DIAGNOSIS — M549 Dorsalgia, unspecified: Secondary | ICD-10-CM | POA: Insufficient documentation

## 2017-01-05 DIAGNOSIS — Z7902 Long term (current) use of antithrombotics/antiplatelets: Secondary | ICD-10-CM | POA: Diagnosis not present

## 2017-01-05 DIAGNOSIS — Z86010 Personal history of colonic polyps: Secondary | ICD-10-CM | POA: Insufficient documentation

## 2017-01-05 DIAGNOSIS — Z7982 Long term (current) use of aspirin: Secondary | ICD-10-CM | POA: Insufficient documentation

## 2017-01-05 DIAGNOSIS — K5730 Diverticulosis of large intestine without perforation or abscess without bleeding: Secondary | ICD-10-CM | POA: Diagnosis not present

## 2017-01-05 DIAGNOSIS — G2581 Restless legs syndrome: Secondary | ICD-10-CM | POA: Insufficient documentation

## 2017-01-05 DIAGNOSIS — Z885 Allergy status to narcotic agent status: Secondary | ICD-10-CM | POA: Insufficient documentation

## 2017-01-05 DIAGNOSIS — Z86711 Personal history of pulmonary embolism: Secondary | ICD-10-CM | POA: Insufficient documentation

## 2017-01-05 DIAGNOSIS — Z96652 Presence of left artificial knee joint: Secondary | ICD-10-CM | POA: Insufficient documentation

## 2017-01-05 DIAGNOSIS — Z7901 Long term (current) use of anticoagulants: Secondary | ICD-10-CM | POA: Insufficient documentation

## 2017-01-05 DIAGNOSIS — G8929 Other chronic pain: Secondary | ICD-10-CM | POA: Diagnosis not present

## 2017-01-05 DIAGNOSIS — K648 Other hemorrhoids: Secondary | ICD-10-CM | POA: Diagnosis not present

## 2017-01-05 DIAGNOSIS — D124 Benign neoplasm of descending colon: Secondary | ICD-10-CM | POA: Insufficient documentation

## 2017-01-05 DIAGNOSIS — I341 Nonrheumatic mitral (valve) prolapse: Secondary | ICD-10-CM | POA: Diagnosis not present

## 2017-01-05 DIAGNOSIS — E039 Hypothyroidism, unspecified: Secondary | ICD-10-CM | POA: Diagnosis not present

## 2017-01-05 DIAGNOSIS — E7800 Pure hypercholesterolemia, unspecified: Secondary | ICD-10-CM | POA: Insufficient documentation

## 2017-01-05 DIAGNOSIS — Z86718 Personal history of other venous thrombosis and embolism: Secondary | ICD-10-CM | POA: Diagnosis not present

## 2017-01-05 DIAGNOSIS — I2510 Atherosclerotic heart disease of native coronary artery without angina pectoris: Secondary | ICD-10-CM | POA: Insufficient documentation

## 2017-01-05 DIAGNOSIS — Z1211 Encounter for screening for malignant neoplasm of colon: Secondary | ICD-10-CM | POA: Insufficient documentation

## 2017-01-05 DIAGNOSIS — Z881 Allergy status to other antibiotic agents status: Secondary | ICD-10-CM | POA: Insufficient documentation

## 2017-01-05 HISTORY — PX: COLONOSCOPY WITH PROPOFOL: SHX5780

## 2017-01-05 HISTORY — DX: Gastro-esophageal reflux disease without esophagitis: K21.9

## 2017-01-05 SURGERY — COLONOSCOPY WITH PROPOFOL
Anesthesia: Monitor Anesthesia Care

## 2017-01-05 MED ORDER — LACTATED RINGERS IV SOLN
INTRAVENOUS | Status: DC
  Administered 2017-01-05: 1000 mL via INTRAVENOUS

## 2017-01-05 MED ORDER — LIDOCAINE 2% (20 MG/ML) 5 ML SYRINGE
INTRAMUSCULAR | Status: AC
  Filled 2017-01-05: qty 5

## 2017-01-05 MED ORDER — PROPOFOL 10 MG/ML IV BOLUS
INTRAVENOUS | Status: AC
  Filled 2017-01-05: qty 60

## 2017-01-05 MED ORDER — PROPOFOL 10 MG/ML IV BOLUS
INTRAVENOUS | Status: DC | PRN
  Administered 2017-01-05: 20 mg via INTRAVENOUS
  Administered 2017-01-05: 30 mg via INTRAVENOUS

## 2017-01-05 MED ORDER — LIDOCAINE 2% (20 MG/ML) 5 ML SYRINGE
INTRAMUSCULAR | Status: DC | PRN
  Administered 2017-01-05: 60 mg via INTRAVENOUS

## 2017-01-05 MED ORDER — ONDANSETRON HCL 4 MG/2ML IJ SOLN
INTRAMUSCULAR | Status: AC
  Filled 2017-01-05: qty 2

## 2017-01-05 MED ORDER — PROPOFOL 500 MG/50ML IV EMUL
INTRAVENOUS | Status: DC | PRN
  Administered 2017-01-05: 150 ug/kg/min via INTRAVENOUS

## 2017-01-05 MED ORDER — ONDANSETRON HCL 4 MG/2ML IJ SOLN
INTRAMUSCULAR | Status: DC | PRN
  Administered 2017-01-05: 4 mg via INTRAVENOUS

## 2017-01-05 MED ORDER — SODIUM CHLORIDE 0.9 % IV SOLN: INTRAVENOUS | Status: DC

## 2017-01-05 SURGICAL SUPPLY — 22 items

## 2017-01-05 NOTE — Anesthesia Preprocedure Evaluation (Addendum)
Anesthesia Evaluation  Patient identified by MRN, date of birth, ID band Patient awake    Reviewed: Allergy & Precautions, NPO status , Patient's Chart, lab work & pertinent test results  History of Anesthesia Complications (+) PONV  Airway Mallampati: II  TM Distance: >3 FB     Dental   Pulmonary pneumonia,    breath sounds clear to auscultation       Cardiovascular + angina + CAD and + Past MI   Rhythm:Regular Rate:Normal     Neuro/Psych  Headaches,    GI/Hepatic Neg liver ROS, GERD  ,  Endo/Other  Hypothyroidism   Renal/GU Renal disease     Musculoskeletal  (+) Arthritis ,   Abdominal   Peds  Hematology  (+) anemia ,   Anesthesia Other Findings   Reproductive/Obstetrics                            Anesthesia Physical Anesthesia Plan  ASA: III  Anesthesia Plan: MAC   Post-op Pain Management:    Induction: Intravenous  PONV Risk Score and Plan: 2 and Ondansetron, Treatment may vary due to age or medical condition and Propofol infusion  Airway Management Planned: Nasal Cannula  Additional Equipment:   Intra-op Plan:   Post-operative Plan:   Informed Consent: I have reviewed the patients History and Physical, chart, labs and discussed the procedure including the risks, benefits and alternatives for the proposed anesthesia with the patient or authorized representative who has indicated his/her understanding and acceptance.   Dental advisory given  Plan Discussed with: CRNA and Anesthesiologist  Anesthesia Plan Comments:        Anesthesia Quick Evaluation

## 2017-01-05 NOTE — Op Note (Signed)
[facility] Patient Name: Brittany Mason Procedure Date: 01/05/2017 MRN: [id number] Attending MD: Juanita Craver , MD Date of Birth: Feb 01, 1951 CSN: [id number] Age: 66 Admit Type: Outpatient Procedure:                Colonoscopy with cold biopsies x 2 & hot snare                            polypectomy x 1. Indications:              CRC screening for colorectal malignant neoplasm. Providers:                Juanita Craver, MD, Elmer Ramp. Tilden Dome, RN, William Dalton, Technician, Dione Booze, CRNA. Referring MD:             Hulan Fess MD Medicines:                Monitored anesthesia care. Complications:            No immediate complications. Estimated Blood Loss:     Estimated blood loss was minimal. Procedure:                Pre-anesthesia assessment: - Prior to the                            procedure, a history and physical was performed,                            and patient medications and allergies were                            reviewed. [name] tolerance of previous                            anesthesia was also reviewed. The risks and                            benefits of the procedure and the sedation options                            and risks were discussed with the patient. All                            questions were answered, and informed consent was                            obtained. Prior anticoagulants: The patient has                            taken Plavix (clopidogrel), last dose was 5 days                            prior to procedure. ASA Grade assessment: III - A  patient with severe systemic disease. After                            reviewing the risks and benefits, the patient was                            deemed in satisfactory condition to undergo the                            procedure.                           After obtaining informed consent, the colonoscope                             was passed under direct vision. Throughout the                            procedure, the patient's blood pressure, pulse, and                            oxygen saturations were monitored continuously. The                            EC-[id number] ([id number]) scope was introduced through                            the anus and advanced to the the cecum, identified                            by appendiceal orifice and ileocecal valve. The                            colonoscopy was performed without difficulty. The                            patient tolerated the procedure well. The quality                            of the bowel preparation was good. The ileocecal                            valve, the appendiceal orifice and the rectum were                            photographed. The bowel preparation used was                            GoLYTELY. Scope In: 7:20:01 AM Scope Out: 7:37:03 AM Scope Withdrawal Time: 0 hours 11 minutes 12 seconds  Total Procedure Duration: 0 hours 17 minutes 2 seconds  Findings:      A small sessile polyp was found in the distal ascending colon-this was       removed by cold biopsies x 2.  A 7 mm sessile polyp was found in the distal ascending colon; the polyp       was removed with a hot snare x 1-200/20; resection and retrieval were       complete.      A few small-mouthed diverticula were found in the sigmoid colon.      Small internal hemorrhoids were found during retroflexion. Impression:               - One small sessile polyp in the distal ascending                            colon-removed by cold biopsies.                           - One 7 mm polyp in the distal ascending colon,                            removed with a hot snare x 1; resected and                            retrieved.                           - Few small scattered diverticula in the sigmoid                            colon.                           - Small internal  hemorrhoids. Moderate Sedation:      MAC used. Recommendation:           - High fiber diet with augmented water consumption                            daily.                           - Continue present medications except for the                            anticaogulants; hold Aspirin and Plavix for a week.                           - Await pathology results.                           - Repeat colonoscopy in 5 years for surveillance.                           - Return to GI office PRN.                           - No Ibuprofen, Naproxen, or other non-steroidal                            anti-inflammatory drugs for 2 weeks after  polyp                            removal.                           - If the patient has any abnormal GI symptoms in                            the interim, she has been advised to call the                            office ASAP for further recommendations. Procedure Code(s):        --- Professional ---                           [phone], Colonoscopy, flexible; with removal of                            tumor(s), polyp(s), or other lesion(s) by snare                            technique                           45380, 15, Colonoscopy, flexible; with biopsy,                            single or multiple Diagnosis Code(s):        --- Professional ---                           Z12.11, Encounter for screening for malignant                            neoplasm of colon                           D12.2, Benign neoplasm of ascending colon                           K57.30, Diverticulosis of large intestine without                            perforation or abscess without bleeding CPT copyright 2016 American Medical Association. All rights reserved. The codes documented in this report are preliminary and upon coder review may  be revised to meet current compliance requirements. Juanita Craver, MD Juanita Craver, MD 01/05/2017 8:00:07 AM This report has been signed  electronically. Number of Addenda: 0

## 2017-01-05 NOTE — Transfer of Care (Signed)
Immediate Anesthesia Transfer of Care Note  Patient: Brittany Mason  Procedure(s) Performed: Procedure(s): COLONOSCOPY WITH PROPOFOL (N/A)  Patient Location: PACU and Endoscopy Unit  Anesthesia Type:MAC  Level of Consciousness: awake, drowsy and patient cooperative  Airway & Oxygen Therapy: Patient Spontanous Breathing and Patient connected to face mask oxygen  Post-op Assessment: Report given to RN and Post -op Vital signs reviewed and stable  Post vital signs: Reviewed and stable  Last Vitals:  Vitals:   01/05/17 0638  BP: 127/64  Pulse: (!) 53  Resp: 12  Temp: 36.9 C    Last Pain:  Vitals:   01/05/17 0638  TempSrc: Oral         Complications: No apparent anesthesia complications

## 2017-01-05 NOTE — Anesthesia Procedure Notes (Signed)
Procedure Name: MAC Date/Time: 01/05/2017 7:15 AM Performed by: Dione Booze Pre-anesthesia Checklist: Patient identified, Emergency Drugs available, Suction available and Patient being monitored Patient Re-evaluated:Patient Re-evaluated prior to induction Oxygen Delivery Method: Simple face mask Placement Confirmation: positive ETCO2

## 2017-01-05 NOTE — H&P (Signed)
Brittany Mason is an 66 y.o. female.   Chief Complaint: Colorectal cancer screening. HPI: 66 year old wwhite female here for a screening colonoscopy. She has had a DVT/PE in 2014 and an MI in 2016. She has been off her Plavix and Aspirin for 5 days now. See office notes for details.  Past Medical History:  Diagnosis Date  . Acute pulmonary embolism ([place]) 06/20/2015  . Arthritis    osteoarthritis-knees. Chronic back pain  . Basal cell carcinoma of right shoulder    "burned off"  . Chronic SI joint pain   . DVT (deep venous thrombosis) ([place]) 04/2013   left lower leg, resulted in Pulmonary emboli on hormone therapy  . Dyspnea on exertion   . GERD (gastroesophageal reflux disease)   . Hypercholesterolemia   . Hypothyroidism   . Migraine    "< once/month" (12/04/2015)  . MVP (mitral valve prolapse)    asymptomatic  . Myocardial infarction ([place]) 11/19/2015   cardiac arrest  . PE (pulmonary embolism) Apr 18, 2013   tx. -using Xarelto now, no further lung problems, denies SOB on 01-02-14  . PONV (postoperative nausea and vomiting)   . RLS (restless legs syndrome)    Past Surgical History:  Procedure Laterality Date  . ANTERIOR CRUCIATE LIGAMENT REPAIR Left 1976; 1981   "open"  . CARDIAC CATHETERIZATION N/A 11/20/2015   Procedure: Left Heart Cath and Coronary Angiography;  Surgeon: Belva Crome, MD;  Location: [facility];  Service: Cardiovascular;  Laterality: N/A;  . CARDIAC CATHETERIZATION N/A 12/04/2015   Procedure: Left Heart Cath and Coronary Angiography;  Surgeon: Peter M Martinique, MD;  Location: [facility];  Service: Cardiovascular;  Laterality: N/A;  . CARDIAC CATHETERIZATION  12/04/2015   Procedure: Coronary Stent Intervention;  Surgeon: Peter M Martinique, MD;  Location: [facility];  Service: Cardiovascular;;  . COLONOSCOPY  09/28/2011   Procedure: COLONOSCOPY;  Surgeon: Juanita Craver, MD;  Location: [facility];  Service: Endoscopy;  Laterality: N/A;  . JOINT  REPLACEMENT    . LEFT HEART CATH AND CORONARY ANGIOGRAPHY N/A 10/08/2016   Procedure: Left Heart Cath and Coronary Angiography;  Surgeon: Peter M Martinique, MD;  Location: [facility];  Service: Cardiovascular;  Laterality: N/A;  . TOTAL ABDOMINAL HYSTERECTOMY  1998  . TOTAL KNEE ARTHROPLASTY Left 01/08/2014   Procedure: LEFT TOTAL KNEE ARTHROPLASTY;  Surgeon: Gearlean Alf, MD;  Location: [facility];  Service: Orthopedics;  Laterality: Left;   Family History  Problem Relation Age of Onset  . Hypertension Mother   . Congestive Heart Failure Mother   . Coronary artery disease Father   . Hypertension Father   . Stroke Brother        DIED AT 30  . Cancer Maternal Aunt        lung, colon  . Breast cancer Maternal Aunt   . Breast cancer Paternal Aunt    Social History:  reports that she has never smoked. She has never used smokeless tobacco. She reports that she does not drink alcohol or use drugs.  Allergies:  Allergies  Allergen Reactions  . Codeine Nausea And Vomiting    Takes hydrocodone; if takes doses too close together, states "violently throws up"  . Erythromycin Hives and Itching   Medications Prior to Admission  Medication Sig Dispense Refill  . aspirin 81 MG tablet Take 81 mg by mouth daily.     Marland Kitchen aspirin-acetaminophen-caffeine (EXCEDRIN MIGRAINE) 250-250-65 MG tablet Take 2 tablets  by mouth every 6 (six) hours as needed for headache.     . clopidogrel (PLAVIX) 75 MG tablet Take 1 tablet (75 mg total) by mouth daily. 90 tablet 2  . cyclobenzaprine (FLEXERIL) 5 MG tablet Take 5 mg by mouth 3 (three) times daily as needed for muscle spasms.    Marland Kitchen escitalopram (LEXAPRO) 20 MG tablet Take 20 mg by mouth every morning.     . ezetimibe (ZETIA) 10 MG tablet Take 1 tablet (10 mg total) by mouth daily. 90 tablet 3  . fenofibrate 160 MG tablet Take 160 mg by mouth at bedtime.     . isosorbide mononitrate (IMDUR) 30 MG 24 hr tablet Take 1 tablet (30 mg total) by mouth daily. 90 tablet 3   . levothyroxine (SYNTHROID, LEVOTHROID) 150 MCG tablet Take 150 mcg by mouth daily before breakfast.     . linaclotide (LINZESS) 290 MCG CAPS capsule Take 290 mcg by mouth every evening. Taking for 8 days prior to procedure    . metoprolol tartrate (LOPRESSOR) 25 MG tablet Take 0.5 tablets (12.5 mg total) by mouth 2 (two) times daily. 90 tablet 3  . morphine (MSIR) 30 MG tablet Take 30 mg by mouth 2 (two) times daily as needed for moderate pain or severe pain (takes 1 tablet every morning and additonal dose onl;y if needed).     Marland Kitchen omega-3 acid ethyl esters (LOVAZA) 1 G capsule Take 2 g by mouth 2 (two) times daily.    Marland Kitchen OVER THE COUNTER MEDICATION Take 2 capsules by mouth daily at 6 PM. "Relizen" herbal supplement for hormone replacement     . Polyethyl Glycol-Propyl Glycol (SYSTANE OP) Place 1-2 drops into both eyes daily as needed (red/itchy eyes).    Marland Kitchen rOPINIRole (REQUIP) 0.5 MG tablet Take 0.25 mg by mouth See admin instructions. Takes every night between 8-9 pm , takes a 1/2 tablet every night but sometimes takes 0.5 tablet if having major symptoms of restless legs    . scopolamine (TRANSDERM-SCOP) 1 MG/3DAYS Place 1 patch onto the skin as needed (for travel).    . simvastatin (ZOCOR) 40 MG tablet Take 1 tablet (40 mg total) by mouth at bedtime. 90 tablet 3  . butalbital-acetaminophen-caffeine (FIORICET, ESGIC) 50-325-40 MG tablet Take 1 tablet by mouth 2 (two) times daily as needed for headache.    . nitroGLYCERIN (NITROSTAT) 0.4 MG SL tablet Place 1 tablet (0.4 mg total) under the tongue every 5 (five) minutes as needed for chest pain. 25 tablet 3   No results found for this or any previous visit (from the past 48 hour(s)). No results found.  Review of Systems  Constitutional: Negative.   HENT: Negative.   Eyes: Negative.   Respiratory: Negative.   Cardiovascular: Negative.   Gastrointestinal: Positive for constipation.  Genitourinary: Negative.   Musculoskeletal: Positive for back  pain and joint pain. Negative for falls, myalgias and neck pain.  Skin: Negative.   Neurological: Negative.   Endo/Heme/Allergies: Negative.   Psychiatric/Behavioral: Negative.    Blood pressure 127/64, pulse (!) 53, temperature 98.4 F (36.9 C), temperature source Oral, resp. rate 12, height 5\' 3"  (1.6 m), weight 78.5 kg (173 lb), SpO2 96 %. Physical Exam  Constitutional: She is oriented to person, place, and time. She appears well-developed and well-nourished.  HENT:  Head: Normocephalic and atraumatic.  Eyes: Pupils are equal, round, and reactive to light. Conjunctivae and EOM are normal.  Neck: Normal range of motion. Neck supple.  Cardiovascular: Normal rate and  regular rhythm.   Respiratory: Effort normal and breath sounds normal.  GI: Soft. Bowel sounds are normal.  Neurological: She is alert and oriented to person, place, and time.  Skin: Skin is warm and dry.  Psychiatric: She has a normal mood and affect. Her behavior is normal. Judgment and thought content normal.   Assessment/Plan Colorectal cancer screening/Constipation/Personal history of colonic polyps-proceed with a colonoscopy at this time.  Tabria Steines, MD 01/05/2017, 6:55 AM

## 2017-01-05 NOTE — Discharge Instructions (Signed)

## 2017-01-05 NOTE — Anesthesia Postprocedure Evaluation (Signed)
Anesthesia Post Note  Patient: Brittany Mason  Procedure(s) Performed: Procedure(s) (LRB): COLONOSCOPY WITH PROPOFOL (N/A)     Patient location during evaluation: PACU Anesthesia Type: MAC Level of consciousness: awake Pain management: pain level controlled Vital Signs Assessment: post-procedure vital signs reviewed and stable Respiratory status: spontaneous breathing Cardiovascular status: stable Postop Assessment: no signs of nausea or vomiting Anesthetic complications: no    Last Vitals:  Vitals:   01/05/17 0810 01/05/17 0820  BP: (!) 116/49 (!) 117/52  Pulse: (!) 42 (!) 46  Resp: 10 13  Temp:      Last Pain:  Vitals:   01/05/17 0744  TempSrc: Oral                 Markevius Trombetta

## 2017-01-07 ENCOUNTER — Encounter (HOSPITAL_COMMUNITY): Payer: Self-pay | Admitting: Gastroenterology

## 2017-01-08 ENCOUNTER — Telehealth: Payer: Self-pay | Admitting: Cardiovascular Disease

## 2017-01-08 DIAGNOSIS — M542 Cervicalgia: Secondary | ICD-10-CM | POA: Diagnosis not present

## 2017-01-08 MED FILL — predniSONE 10 MG TABS: 10 | 6 days supply | Qty: 21 | Fill #0

## 2017-01-08 NOTE — Telephone Encounter (Signed)
OK 

## 2017-01-08 NOTE — Telephone Encounter (Signed)
Called patient back with Dr. Nishan's message. Patient verbalized understanding. 

## 2017-01-08 NOTE — Telephone Encounter (Signed)
New message    Pt c/o medication issue:  1. Name of Medication: Prednisone   2. How are you currently taking this medication (dosage and times per day)? Prednisone pack  3. Are you having a reaction (difficulty breathing--STAT)?   4. What is your medication issue? Pt wants to know if it's ok to take this medication. She said some of the symptoms are slow or fast heart rate. Please call.

## 2017-01-08 NOTE — Telephone Encounter (Signed)
Patient is concerned about taking prednisone for a period of two weeks for pain and stiffness in her neck. Informed patient that it should be fine to take the prednisone, but will consult Dr. Johnsie Cancel. Will forward to Dr. Johnsie Cancel for advisement.

## 2017-01-11 MED FILL — METOPROLOL TARTRATE 25 MG T: 25 | 90 days supply | Qty: 90 | Fill #1

## 2017-01-22 DIAGNOSIS — M542 Cervicalgia: Secondary | ICD-10-CM | POA: Diagnosis not present

## 2017-01-29 DIAGNOSIS — M542 Cervicalgia: Secondary | ICD-10-CM | POA: Diagnosis not present

## 2017-02-03 DIAGNOSIS — E039 Hypothyroidism, unspecified: Secondary | ICD-10-CM | POA: Diagnosis not present

## 2017-02-03 MED FILL — ESCITALOPRAM 20 MG TABLET: 20 | 90 days supply | Qty: 90 | Fill #1

## 2017-02-04 DIAGNOSIS — M542 Cervicalgia: Secondary | ICD-10-CM | POA: Diagnosis not present

## 2017-02-04 MED FILL — SYNTHROID 137 MCG TABLET: 137 | 30 days supply | Qty: 30 | Fill #0

## 2017-02-05 DIAGNOSIS — R635 Abnormal weight gain: Secondary | ICD-10-CM | POA: Diagnosis not present

## 2017-02-05 DIAGNOSIS — I517 Cardiomegaly: Secondary | ICD-10-CM | POA: Diagnosis not present

## 2017-02-05 DIAGNOSIS — R898 Other abnormal findings in specimens from other organs, systems and tissues: Secondary | ICD-10-CM | POA: Diagnosis not present

## 2017-02-05 DIAGNOSIS — R899 Unspecified abnormal finding in specimens from other organs, systems and tissues: Secondary | ICD-10-CM | POA: Diagnosis not present

## 2017-02-09 MED FILL — OMEGA-3 ETHYL ESTER 1 GM CA: 1 | 90 days supply | Qty: 360 | Fill #1

## 2017-02-12 IMAGING — CR DG CHEST 1V PORT
1 series · 1 of 1 positions shown · non-contrast
Comparison: Chest x-ray dated 08/21/2015.

CLINICAL DATA: Post CPR.  History of mitral valve prolapse.

EXAM:
PORTABLE CHEST 1 VIEW

[AP]
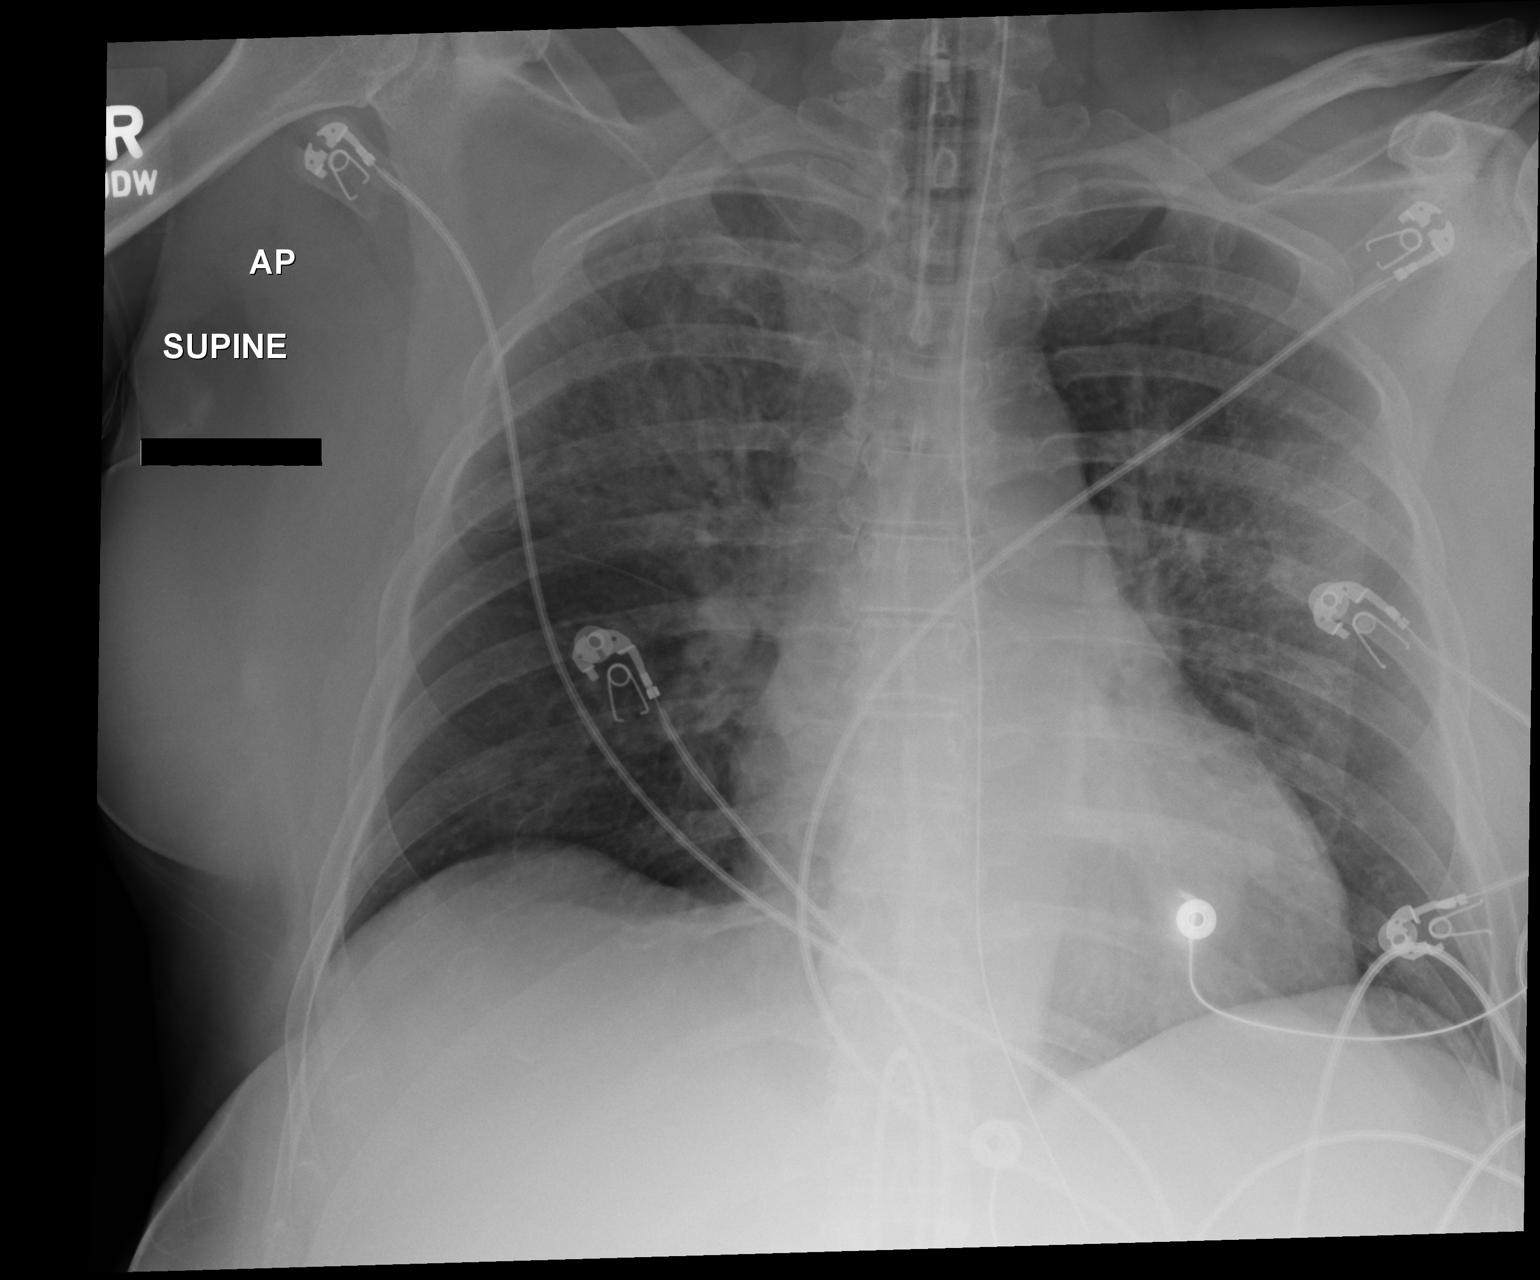

[1 of 1 positions shown; findings below may reference images not displayed]

FINDINGS: Mild cardiomegaly is stable. Endotracheal tube is well positioned
with tip approximately 2.5 cm above the carina. Enteric tube passes
below the diaphragm.

Central pulmonary vascular congestion and bilateral interstitial
edema, most confluent within the right upper lung. No pleural
effusion or pneumothorax seen. Osseous structures about the chest
are unremarkable.
IMPRESSION: 1. Mild cardiomegaly with central pulmonary vascular congestion and
bilateral interstitial edema indicating mild volume overload and/or
sequela of resuscitation efforts. Lungs otherwise clear. No
pneumothorax.
2. Endotracheal tube well positioned with tip approximately 2.5 cm
above the carina.

## 2017-02-13 IMAGING — CR DG ABDOMEN 1V
2 series · 2 of 2 positions shown · non-contrast
Comparison: None.

CLINICAL DATA: Encounter for orogastric tube placement

EXAM:
ABDOMEN - 1 VIEW

[AP (1 of 2)]
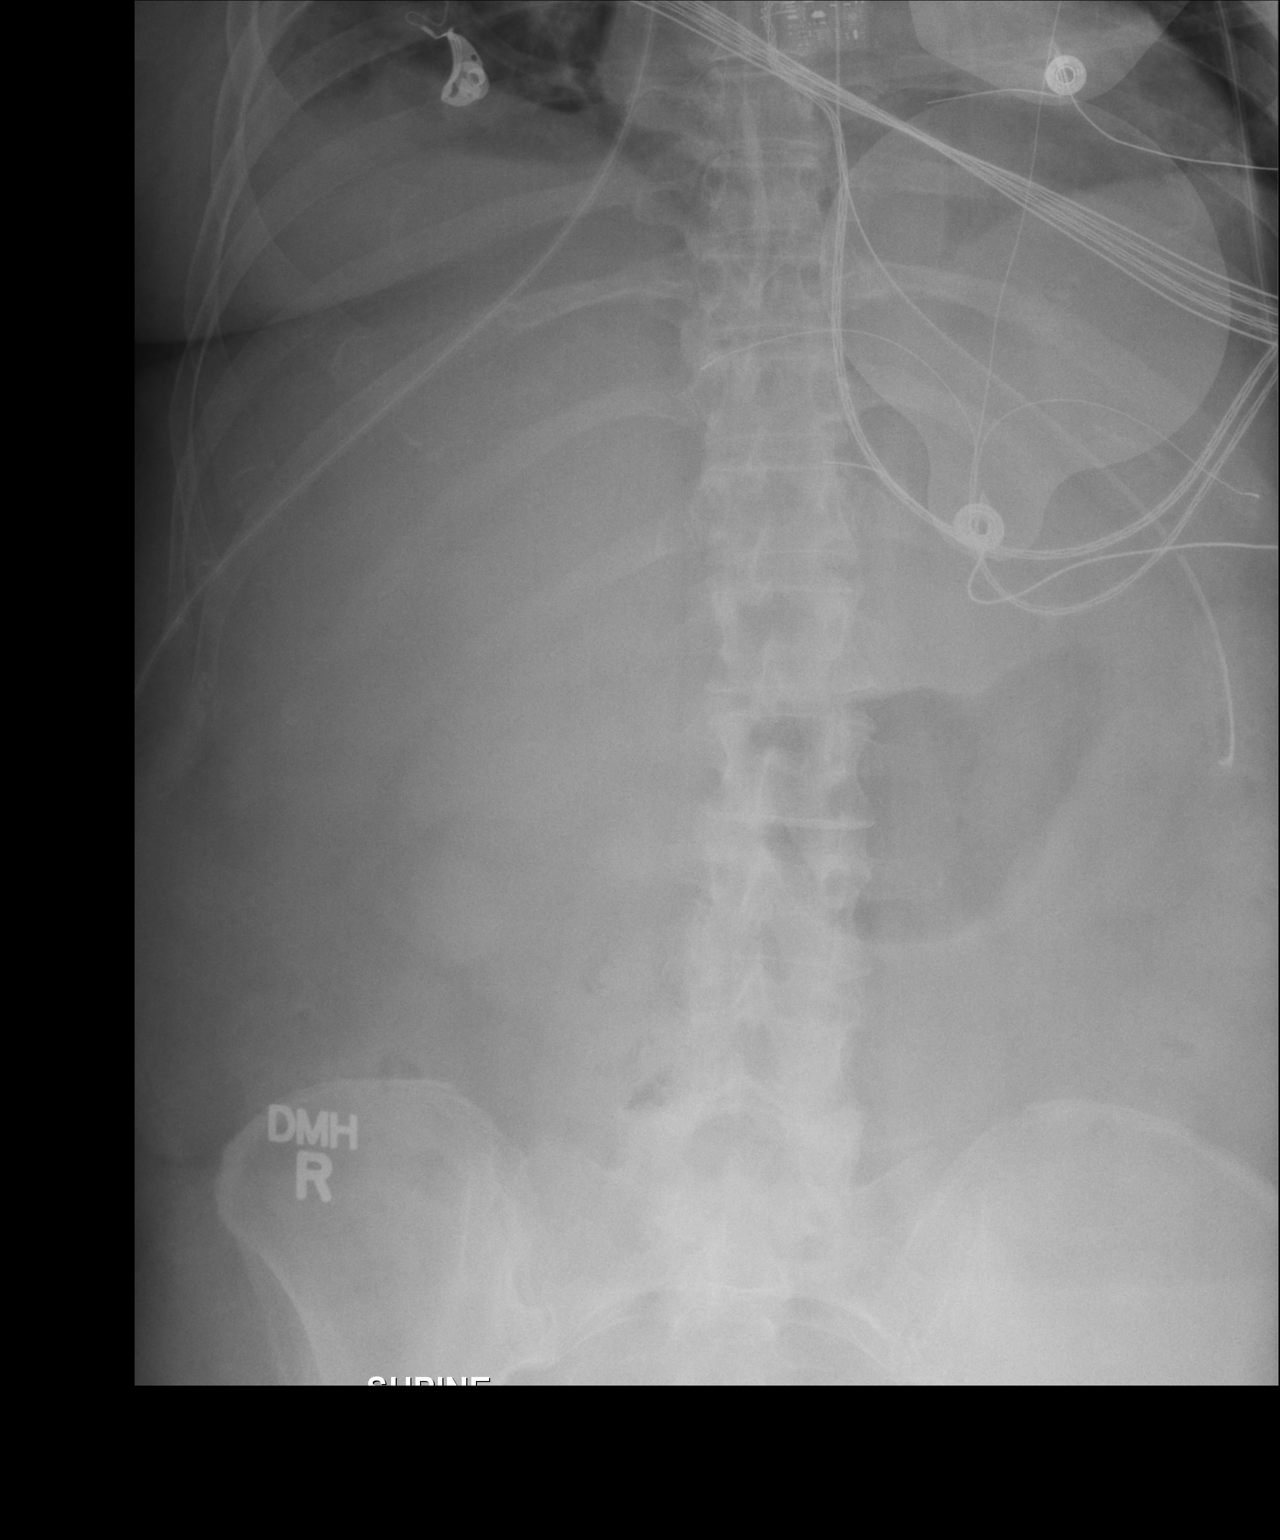

[AP (2 of 2)]
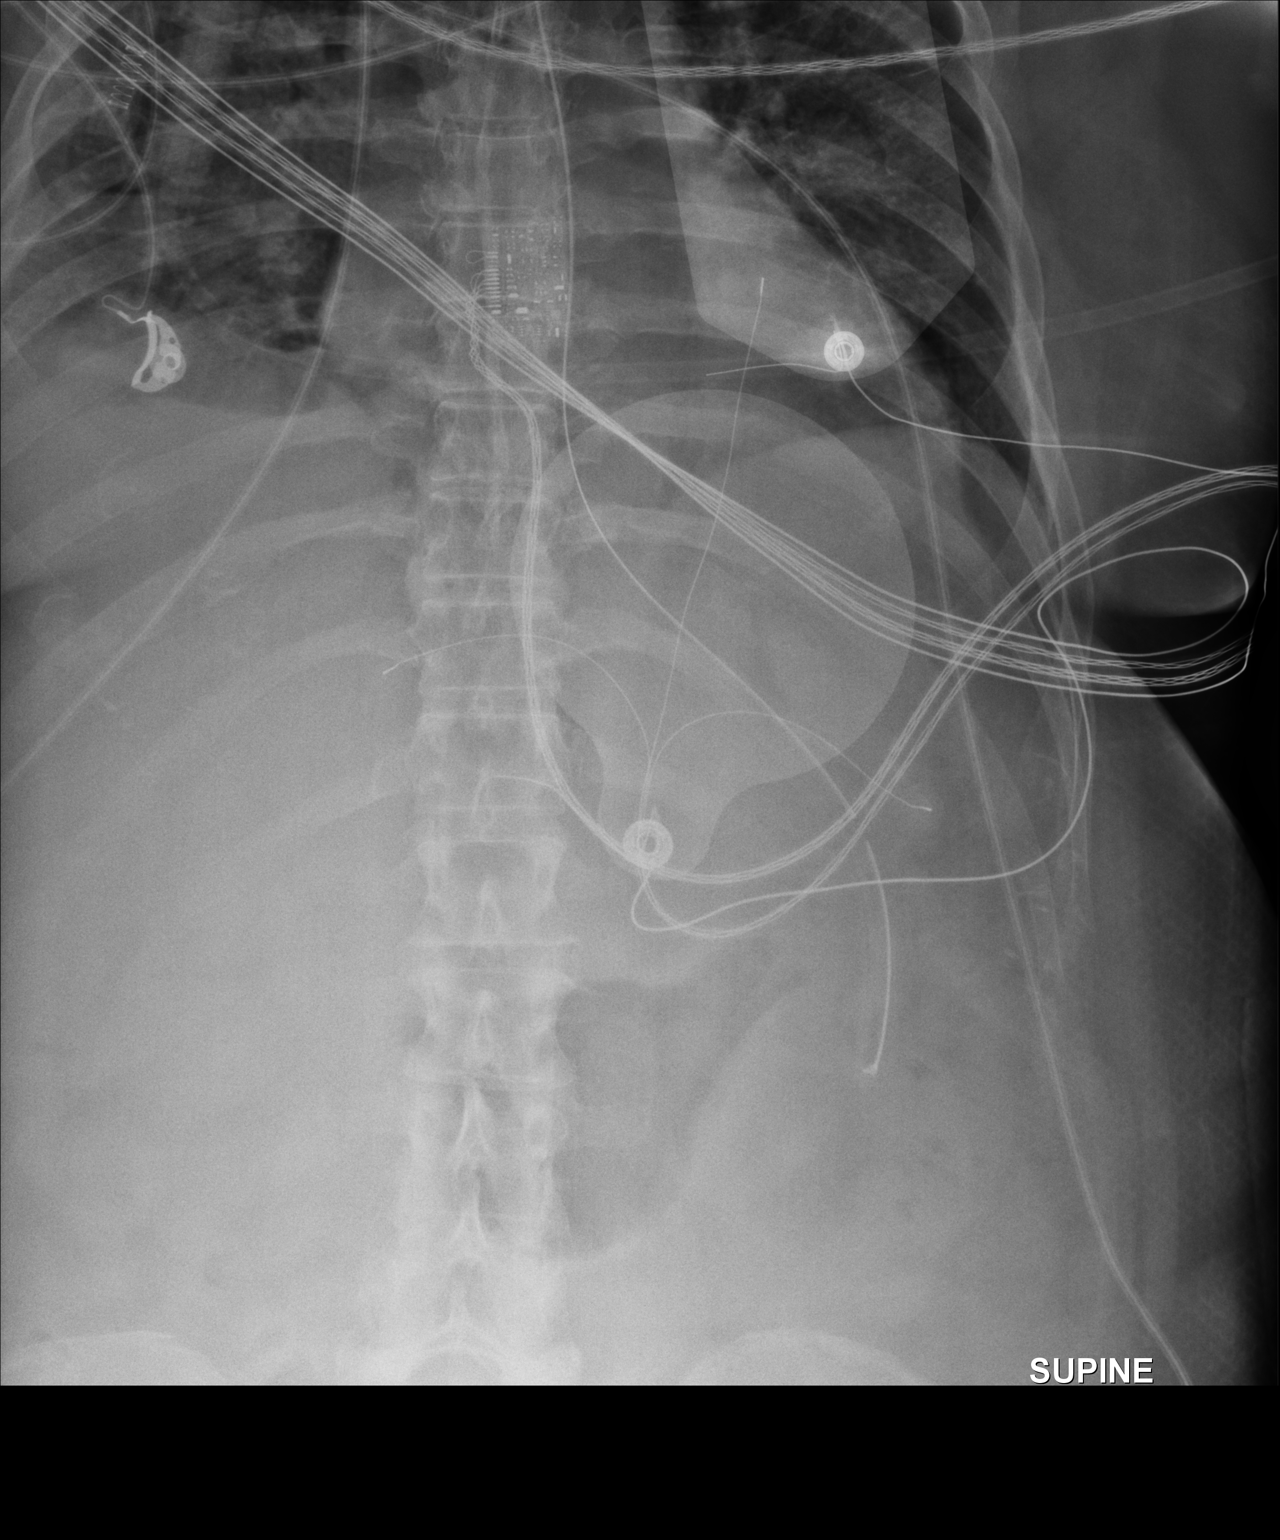

[2 of 2 positions shown; findings below may reference images not displayed]

FINDINGS: Orogastric tube projects over the left upper quadrant in the region
of the stomach. Primarily gasless abdomen otherwise.
IMPRESSION: Orogastric tube projects over the stomach

## 2017-02-13 IMAGING — CR DG CHEST 1V PORT
1 series · 1 of 1 positions shown · non-contrast
Comparison: 11/19/2015

CLINICAL DATA: Central line placement, intubated

EXAM:
PORTABLE CHEST 1 VIEW

[AP]
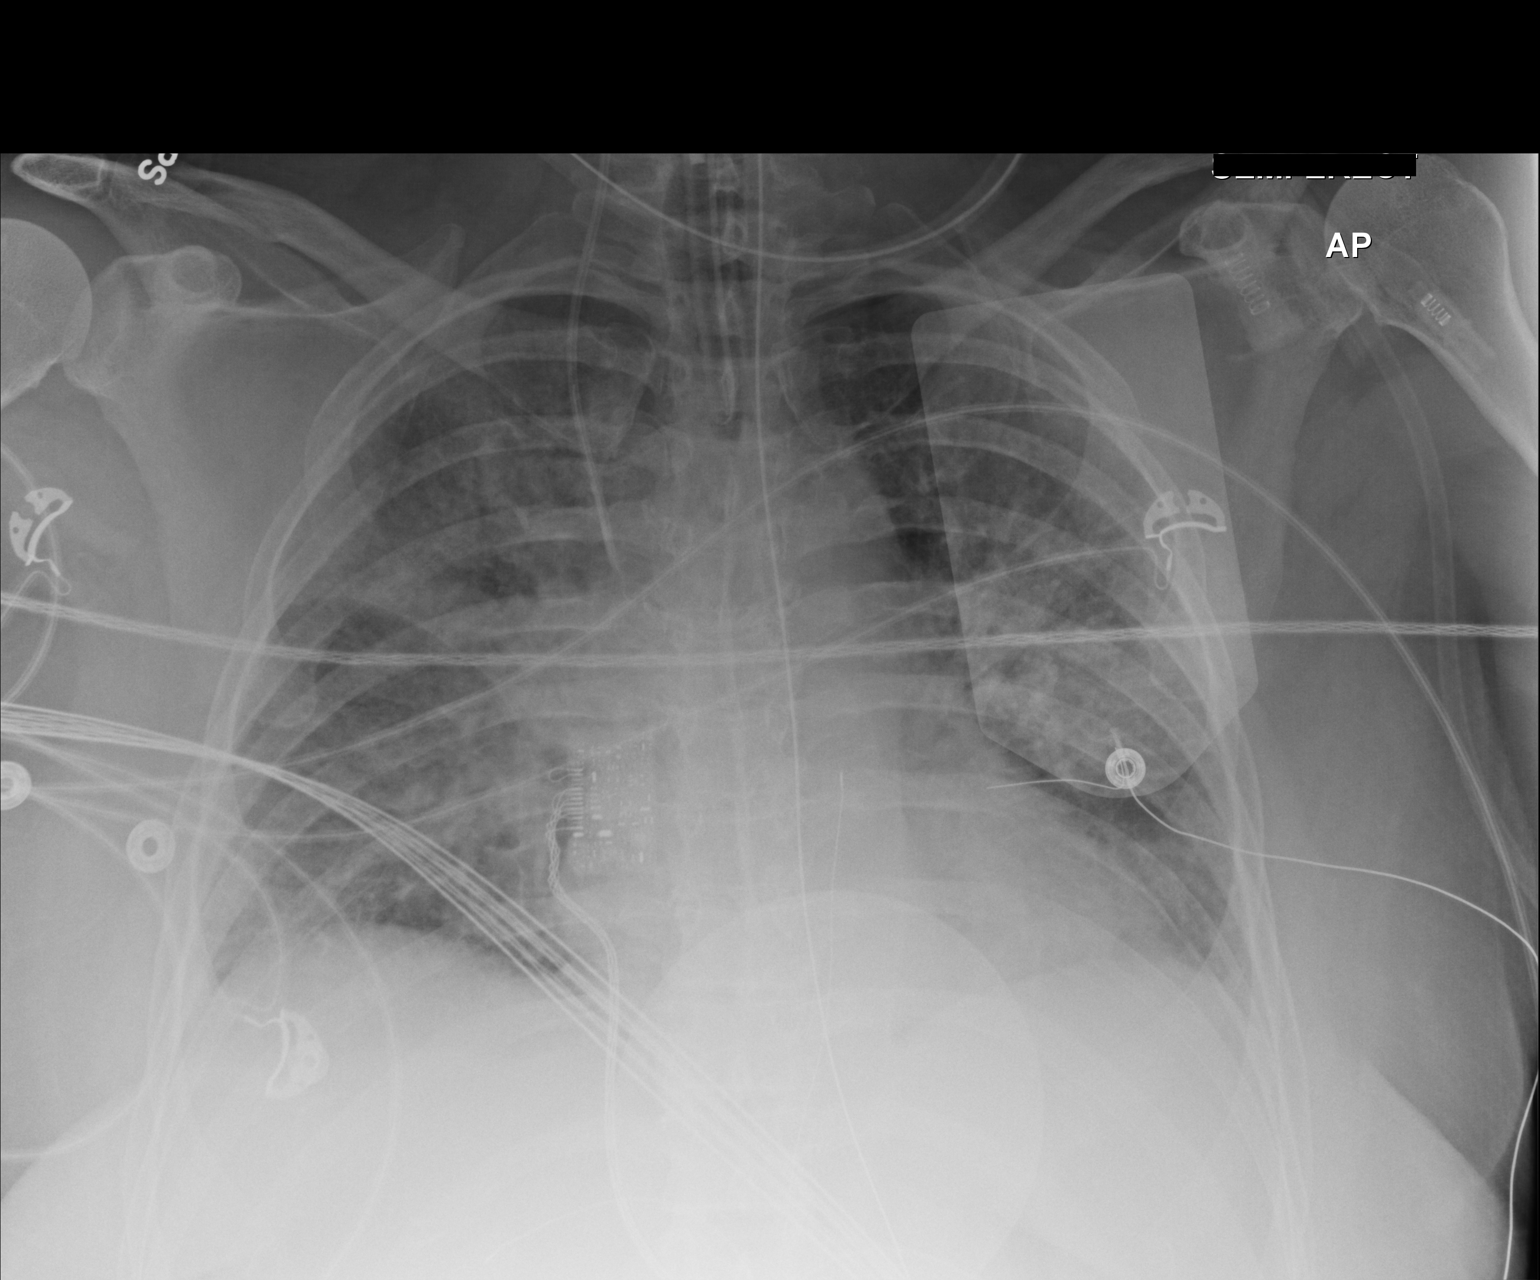

[1 of 1 positions shown; findings below may reference images not displayed]

FINDINGS: Slight worsening in aeration. There is right upper lobe and left
perihilar airspace disease suspicious for multifocal pneumonia. Mild
bilateral basilar atelectasis or infiltrate. Stable endotracheal and
NG tube position. There is right IJ central line with tip in SVC. No
pneumothorax.
IMPRESSION: Slight worsening in aeration. Right upper lobe and left perihilar
airspace disease suspicious for bilateral pneumonia. Mild basilar
atelectasis or infiltrate. Stable endotracheal and NG tube position.
Right IJ central line in place. No pneumothorax.

## 2017-02-14 IMAGING — CR DG CHEST 1V PORT
1 series · 1 of 1 positions shown · non-contrast
Comparison: 11/20/2015.  CT 11/19/2015.

CLINICAL DATA: Shortness of breath.

EXAM:
PORTABLE CHEST 1 VIEW

[AP]
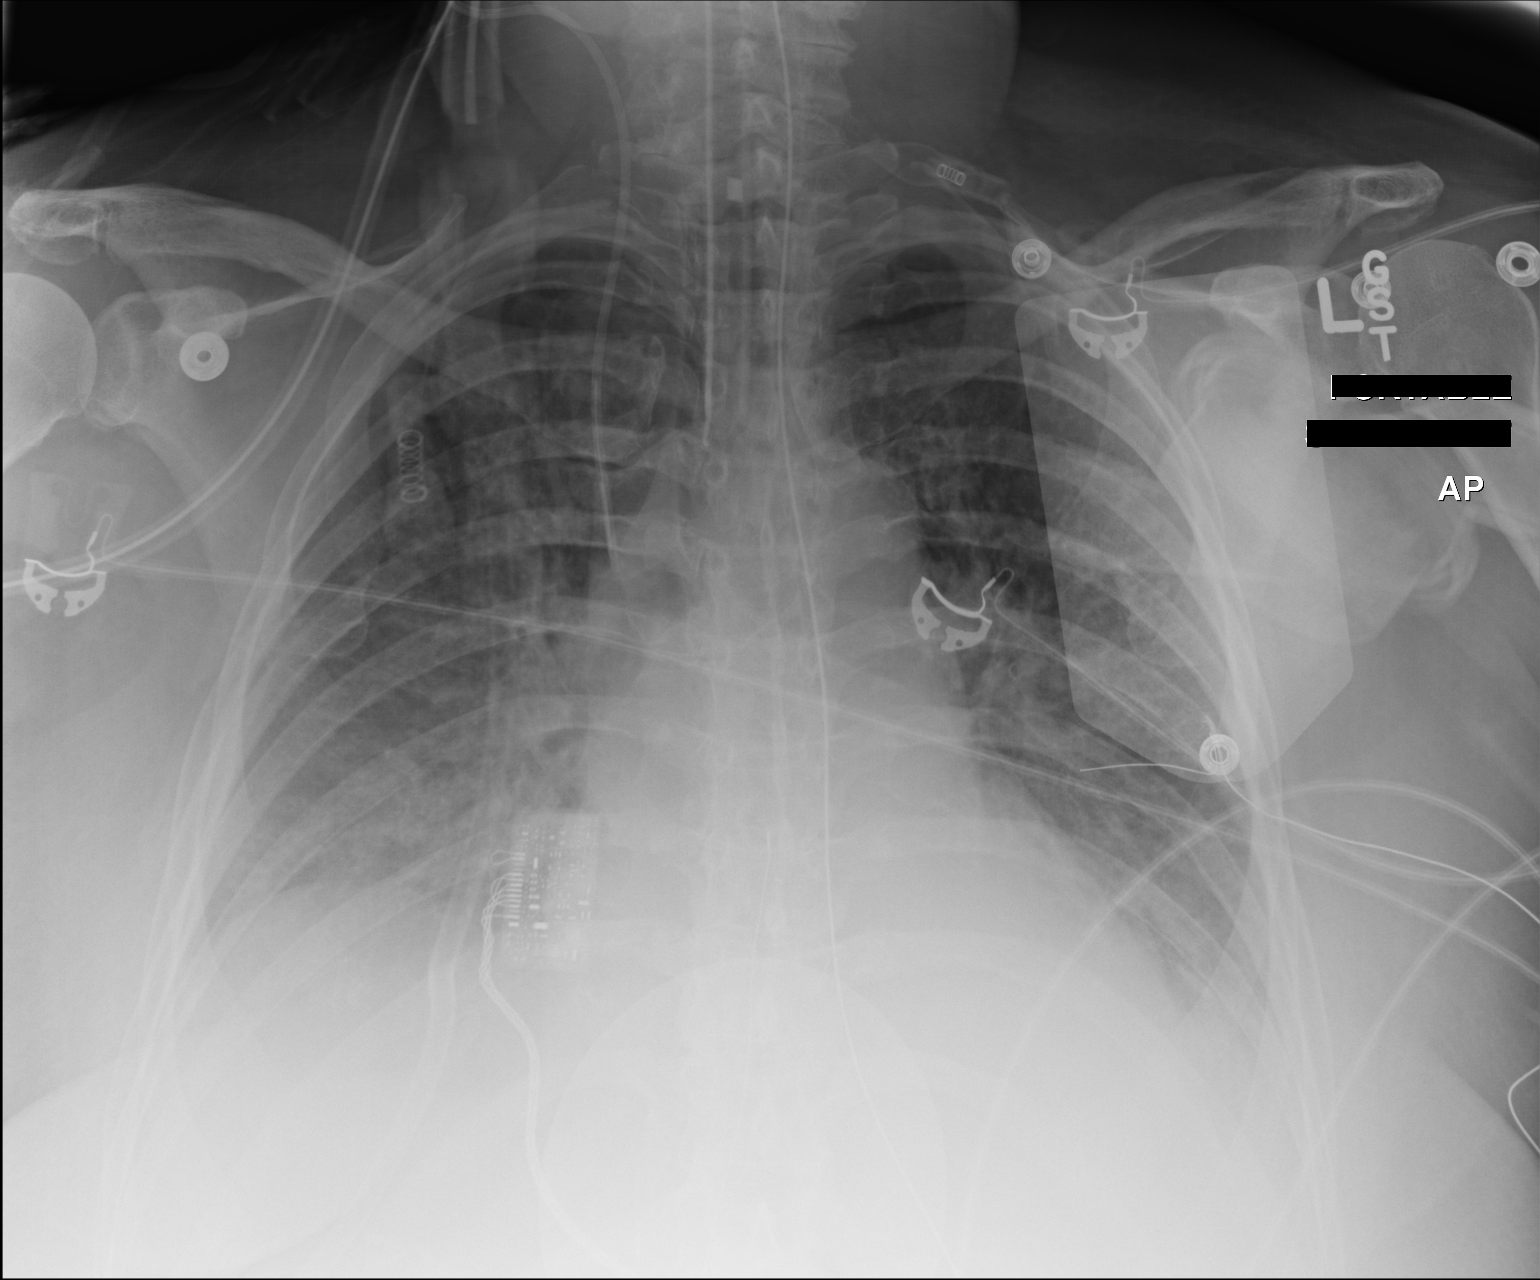

[1 of 1 positions shown; findings below may reference images not displayed]

FINDINGS: Endotracheal tube, NG tube, right IJ line stable position.
Cardiomegaly. Increasing bilateral airspace disease and pleural
effusions. Findings suggest possibly congestive heart failure.
Bilateral pneumonia cannot be excluded. No pneumothorax .
IMPRESSION: 1. Lines and tubes in stable position.
2. Cardiomegaly with progressive diffuse bilateral airspace disease
and bilateral pleural effusions suggesting congestive heart failure.
Bilateral pneumonia cannot be excluded.

## 2017-02-15 IMAGING — DX DG CHEST 1V PORT
1 series · 1 of 1 positions shown · non-contrast
Comparison: 11/21/2015.

CLINICAL DATA: Intubation.

EXAM:
PORTABLE CHEST 1 VIEW

[chest ap]
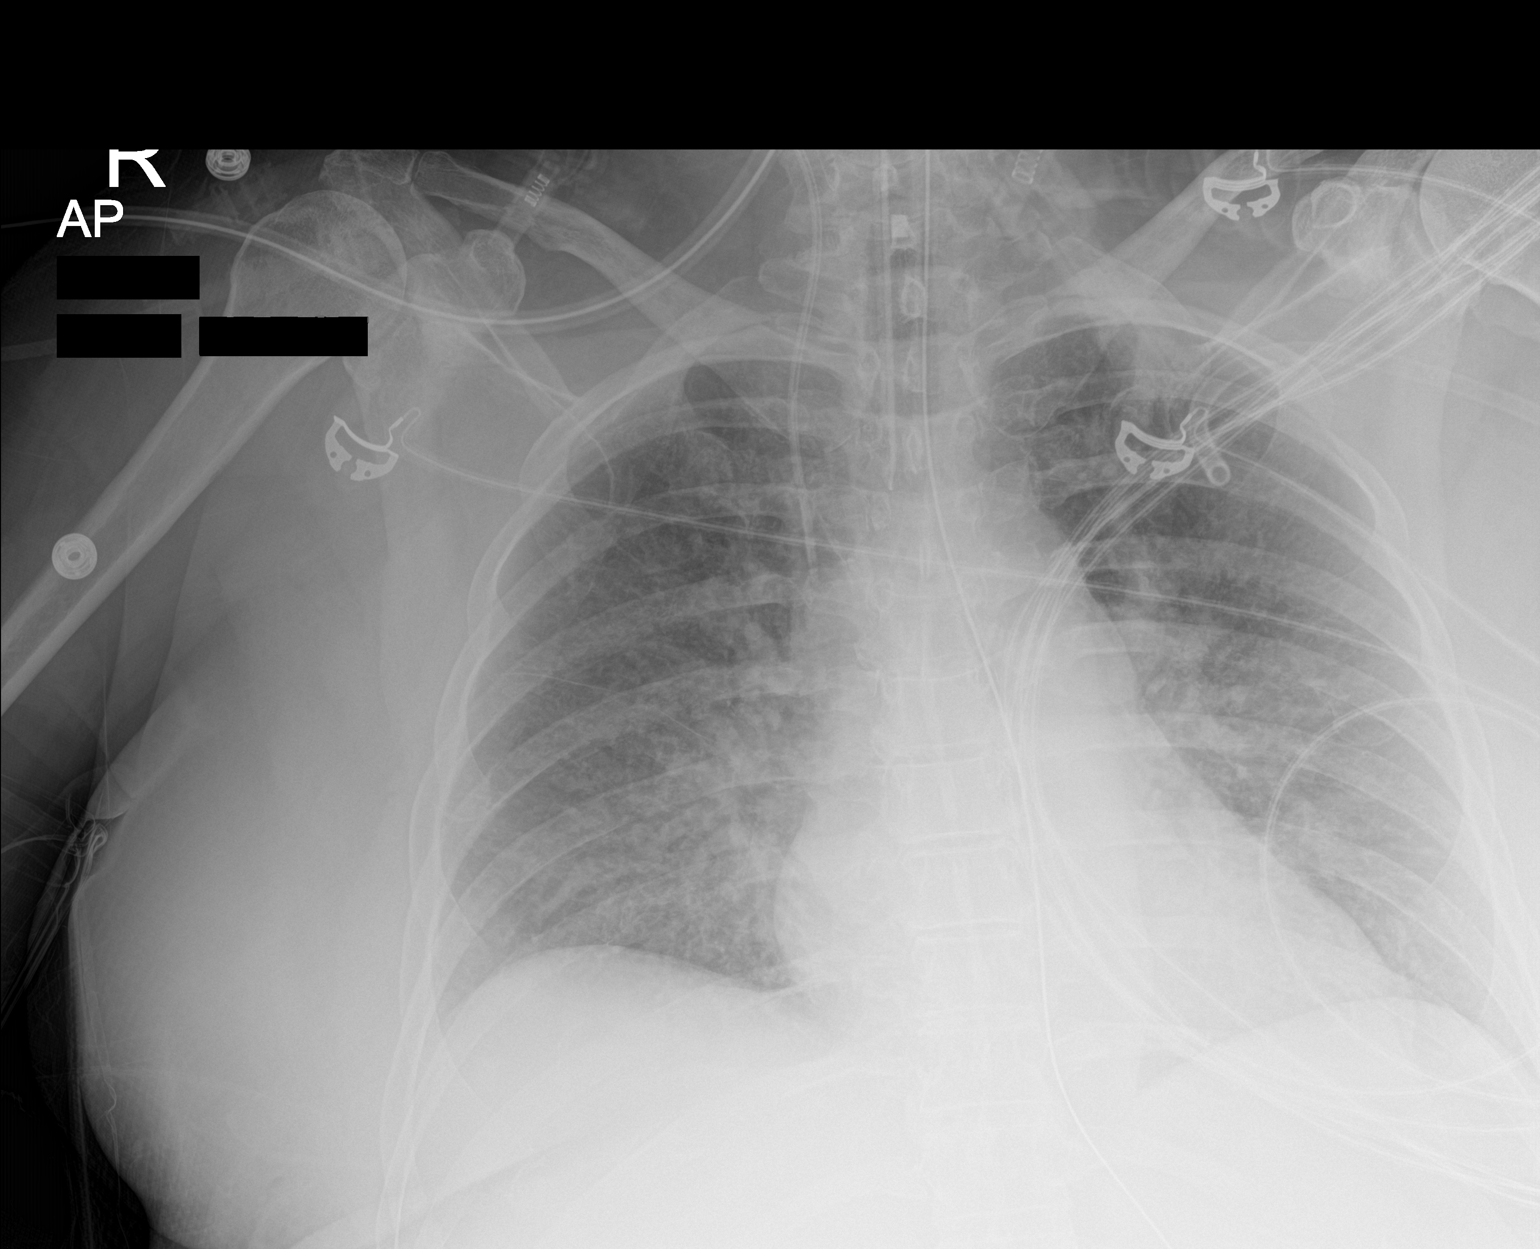

[1 of 1 positions shown; findings below may reference images not displayed]

FINDINGS: Endotracheal tube, NG tube, right IJ line stable position. Mild
cardiomegaly. Pulmonary venous congestion. Diffuse bilateral
pulmonary infiltrates most consistent pulmonary edema. Slight
interim clearing from prior exam. No pleural effusion or
pneumothorax.
IMPRESSION: 1. Lines and tubes in stable position.

2. Persistent cardiomegaly mild pulmonary venous congestion.
Bilateral pulmonary infiltrates again noted, slightly improved prior
exam. Findings consistent with slight improvement of congestive
heart failure.

## 2017-02-18 ENCOUNTER — Ambulatory Visit: Payer: 59 | Attending: Orthopedic Surgery | Admitting: Physical Therapy

## 2017-02-18 ENCOUNTER — Encounter: Payer: Self-pay | Admitting: Physical Therapy

## 2017-02-18 DIAGNOSIS — M542 Cervicalgia: Secondary | ICD-10-CM | POA: Diagnosis not present

## 2017-02-18 DIAGNOSIS — R252 Cramp and spasm: Secondary | ICD-10-CM | POA: Diagnosis not present

## 2017-02-18 NOTE — Therapy (Signed)
[facility] [address] [place], [place], [address] Phone: [phone]   Fax:  [phone]  Physical Therapy Evaluation  Patient Details  Name: Brittany Mason MRN: [id number] Date of Birth: 1950/09/12 Referring Provider: Dr. Latanya Maudlin  Encounter Date: 02/18/2017      PT End of Session - 02/18/17 0915    Visit Number 1   Date for PT Re-Evaluation 04/15/17   Authorization Type UMR   PT Start Time 0845   PT Stop Time 0935   PT Time Calculation (min) 50 min   Activity Tolerance Patient tolerated treatment well   Behavior During Therapy [facility] for tasks assessed/performed      Past Medical History:  Diagnosis Date  . Acute pulmonary embolism ([place]) 06/20/2015  . Arthritis    osteoarthritis-knees. Chronic back pain  . Basal cell carcinoma of right shoulder    "burned off"  . Chronic SI joint pain   . DVT (deep venous thrombosis) ([place]) 04/2013   left lower leg, resulted in Pulmonary emboli on hormone therapy  . Dyspnea on exertion   . GERD (gastroesophageal reflux disease)   . Hypercholesterolemia   . Hypothyroidism   . Migraine    "< once/month" (12/04/2015)  . MVP (mitral valve prolapse)    asymptomatic  . Myocardial infarction ([place]) 11/19/2015   cardiac arrest  . PE (pulmonary embolism) Apr 18, 2013   tx. -using Xarelto now, no further lung problems, denies SOB on 01-02-14  . PONV (postoperative nausea and vomiting)   . RLS (restless legs syndrome)     Past Surgical History:  Procedure Laterality Date  . ANTERIOR CRUCIATE LIGAMENT REPAIR Left 1976; 1981   "open"  . CARDIAC CATHETERIZATION N/A 11/20/2015   Procedure: Left Heart Cath and Coronary Angiography;  Surgeon: Belva Crome, MD;  Location: [place] [facility];  Service: Cardiovascular;  Laterality: N/A;  . CARDIAC CATHETERIZATION N/A 12/04/2015   Procedure: Left Heart Cath and Coronary Angiography;  Surgeon: Peter M Martinique, MD;  Location: [place] [facility];  Service: Cardiovascular;  Laterality: N/A;  . CARDIAC CATHETERIZATION  12/04/2015   Procedure: Coronary Stent Intervention;  Surgeon: Peter M Martinique, MD;  Location: [place] [facility];  Service: Cardiovascular;;  . COLONOSCOPY  09/28/2011   Procedure: COLONOSCOPY;  Surgeon: Juanita Craver, MD;  [facility];  Service: Endoscopy;  Laterality: N/A;  . COLONOSCOPY WITH PROPOFOL N/A 01/05/2017   Procedure: COLONOSCOPY WITH PROPOFOL;  Surgeon: Juanita Craver, MD;  [facility];  Service: Endoscopy;  Laterality: N/A;  . JOINT REPLACEMENT    . LEFT HEART CATH AND CORONARY ANGIOGRAPHY N/A 10/08/2016   Procedure: Left Heart Cath and Coronary Angiography;  Surgeon: Peter M Martinique, MD;  Location: [place] [facility];  Service: Cardiovascular;  Laterality: N/A;  . TOTAL ABDOMINAL HYSTERECTOMY  1998  . TOTAL KNEE ARTHROPLASTY Left 01/08/2014   Procedure: LEFT TOTAL KNEE ARTHROPLASTY;  Surgeon: Gearlean Alf, MD;  [facility]: [facility];  Service: Orthopedics;  Laterality: Left;    There were no vitals filed for this visit.       Subjective Assessment - 02/18/17 0852    Subjective Patient reports second week of June woke up with stiff neck.  One week later she noticed trouble turning her head and has becoming progressively worse.  Patient has trouble with shopping in the grocery store due to frequent head movement and causes a headache.    Patient Stated Goals improve movement especially with  driving and shopping; reduce pain   Currently in Pain? Yes   Pain Score 3    Pain Location Neck   Pain Orientation Right;Left   Pain Descriptors / Indicators Aching   Pain Onset More than a month ago   Pain Frequency Intermittent   Aggravating Factors  driving; shopping with head movement   Pain Relieving Factors ice   Multiple Pain Sites No            [facility] PT Assessment - 02/18/17 0001      Assessment   Medical Diagnosis M54.2 Cervical pain   Referring Provider Dr. Latanya Maudlin   Onset Date/Surgical Date 11/16/16   Prior Therapy None     Precautions   Precautions None     Restrictions   Weight Bearing Restrictions No     Balance Screen   Has the patient fallen in the past 6 months No   Has the patient had a decrease in activity level because of a fear of falling?  No   Is the patient reluctant to leave their home because of a fear of falling?  No     Home Ecologist residence     Prior Function   Level of Independence Independent   Vocation Full time employment   Vocation Requirements sitting at computer with computer glasses; constantly moving head between two screens   Leisure 3 times per week teadmill     Cognition   Overall Cognitive Status Within Functional Limits for tasks assessed     Observation/Other Assessments   Focus on Therapeutic Outcomes (FOTO)  58% limitation  39% limitation is goal     Posture/Postural Control   Posture/Postural Control No significant limitations     ROM / Strength   AROM / PROM / Strength AROM;Strength     AROM   Cervical Flexion limited by 10% with pain   Cervical Extension decreased by 75%    Cervical - Right Side Bend decreased by 75%   Cervical - Left Side Bend decreased by 75%   Cervical - Right Rotation decreased by 50%   Cervical - Left Rotation decreased by 75%     Strength   Overall Strength Comments bil. UE 5/5     Palpation   SI assessment  decreased mobility of C1-T2   Palpation comment tenderness located over left C5 and rotated left     Transfers   Transfers Not assessed     Ambulation/Gait   Ambulation/Gait No            Objective measurements completed on examination: See above findings.          [facility] Adult PT Treatment/Exercise - 02/18/17 0001      Modalities   Modalities Traction     Traction   Type of Traction Cervical   Min (lbs) 5   Max (lbs) 18   Time 15 min                  PT Short Term Goals -  02/18/17 0922      PT SHORT TERM GOAL #1   Title indpendent with initial HEP   Time 4   Period Weeks   Status New   Target Date 04/15/17     PT SHORT TERM GOAL #2   Title ability to shop looking at the signs with moderate difficutly due to improved cervical motion   Time 4   Period Weeks   Status New  Target Date 03/18/17     PT SHORT TERM GOAL #3   Title ability to turn head with driving with pain decreased >/= 25% due to improved mobility   Time 4   Period Weeks   Status New   Target Date 03/18/17           PT Long Term Goals - 02/18/17 0918      PT LONG TERM GOAL #1   Title independent with HEP   Time 8   Period Weeks   Status New   Target Date 04/15/17     PT LONG TERM GOAL #2   Title ability to turn her head without difficulty while driving due to improved  cervical ROM   Time 8   Period Weeks   Status New   Target Date 04/15/17     PT LONG TERM GOAL #3   Title abilty to shop in grocery store with looking at the signs with minimal to no difficulty and no headache   Time 8   Period Weeks   Status New   Target Date 04/15/17     PT LONG TERM GOAL #4   Title able to move head while working between 2 monitors with minimal to no difficulty due to improve cervical ROM   Time 8   Period Weeks   Status New   Target Date 04/15/17     PT LONG TERM GOAL #5   Title FOTO score </= 39% limitation   Time 8   Period Weeks   Status New   Target Date 04/15/17                Plan - 02/18/17 1610    Clinical Impression Statement Patient is a 66 year old female with cervical pain and limited mobility with sudden onset in June 2018.  Patient reports her cervical pain with turning and looking upward averages 3/10.  Cervical ROM is limited by 50-75% for all motions.  Patient has limitation of C1-T3 vertebrae.  Patient has tenderness located on left side of C5.  Patient has difficulty with turning her head with driving and looking upward with shopping.  Patient  will benefit from skilled therapy to imrove cervical motion and reduce pain to improve function.    History and Personal Factors relevant to plan of care: hypothyroidism; cardiac history   Clinical Presentation Stable   Clinical Presentation due to: stable condition   Clinical Decision Making Low   Rehab Potential Excellent   Clinical Impairments Affecting Rehab Potential hypothyroidism; cardiac history   PT Frequency 2x / week   PT Duration 8 weeks   PT Treatment/Interventions Cryotherapy;Electrical Stimulation;Moist Heat;Traction;Ultrasound;Therapeutic activities;Therapeutic exercise;Neuromuscular re-education;Patient/family education;Passive range of motion;Manual techniques;Dry needling   PT Next Visit Plan see how cervical traction did; soft tissue work; joint mobilization of cervical; posture; cervical ROM exercises   PT Home Exercise Plan cervical stretches   Consulted and Agree with Plan of Care Patient      Patient will benefit from skilled therapeutic intervention in order to improve the following deficits and impairments:  Decreased range of motion, Increased fascial restricitons, Pain, Decreased activity tolerance, Decreased mobility  Visit Diagnosis: Cervicalgia - Plan: PT plan of care cert/re-cert  Cramp and spasm - Plan: PT plan of care cert/re-cert     Problem List Patient Active Problem List   Diagnosis Date Noted  . CAD in native artery 12/04/2015  . Cardiomyopathy, ischemic 12/04/2015  . CAD (coronary artery disease), native coronary artery 12/04/2015  .  History of pulmonary embolism 11/25/2015  . Lactic acidosis 11/25/2015  . AKI (acute kidney injury) ([place]) 11/25/2015  . Aspiration pneumonia ([place]) 11/25/2015  . Hyperglycemia 11/25/2015  . Normocytic anemia 11/25/2015  . Acute respiratory failure with hypoxia ([place])   . Cardiogenic shock ([place])   . NSTEMI (non-ST elevated myocardial infarction) ([place])   . Cardiac arrest ([place]) 11/19/2015  . Angina pectoris  ([place]) 08/28/2015  . Retinal detachment 05/07/2014  . OA (osteoarthritis) of knee 01/08/2014  . Preoperative respiratory examination 11/22/2013  . Pulmonary embolism ([place]) 04/18/2013  . Chronic back pain 04/18/2013  . Dyspnea on exertion 04/06/2013  . 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 Hypothyroidism   . Hypercholesterolemia     Earlie Counts, PT 02/18/17 9:30 AM   [facility] [address], [place], [place], [address] Phone: [phone]   Fax:  [phone]  Name: Brittany Mason MRN: [id number] Date of Birth: 18-Aug-1950

## 2017-02-22 ENCOUNTER — Ambulatory Visit: Payer: 59 | Admitting: Physical Therapy

## 2017-02-22 DIAGNOSIS — M542 Cervicalgia: Secondary | ICD-10-CM

## 2017-02-22 DIAGNOSIS — R252 Cramp and spasm: Secondary | ICD-10-CM

## 2017-02-22 NOTE — Therapy (Signed)
[facility] [address], [place], [place], [address] Phone: [phone]   Fax:  [phone]  Physical Therapy Treatment  Patient Details  Name: Brittany Mason MRN: [id number] Date of Birth: 08-03-1950 Referring Provider: Dr. Latanya Maudlin  Encounter Date: 02/22/2017      PT End of Session - 02/22/17 1707    Visit Number 2   Date for PT Re-Evaluation 04/15/17   Authorization Type UMR   PT Start Time 1619   PT Stop Time 1700   PT Time Calculation (min) 41 min   Activity Tolerance Patient tolerated treatment well;No increased pain   Behavior During Therapy WFL for tasks assessed/performed      Past Medical History:  Diagnosis Date  . Acute pulmonary embolism ([place]) 06/20/2015  . Arthritis    osteoarthritis-knees. Chronic back pain  . Basal cell carcinoma of right shoulder    "burned off"  . Chronic SI joint pain   . DVT (deep venous thrombosis) ([place]) 04/2013   left lower leg, resulted in Pulmonary emboli on hormone therapy  . Dyspnea on exertion   . GERD (gastroesophageal reflux disease)   . Hypercholesterolemia   . Hypothyroidism   . Migraine    "< once/month" (12/04/2015)  . MVP (mitral valve prolapse)    asymptomatic  . Myocardial infarction ([place]) 11/19/2015   cardiac arrest  . PE (pulmonary embolism) Apr 18, 2013   tx. -using Xarelto now, no further lung problems, denies SOB on 01-02-14  . PONV (postoperative nausea and vomiting)   . RLS (restless legs syndrome)     Past Surgical History:  Procedure Laterality Date  . ANTERIOR CRUCIATE LIGAMENT REPAIR Left 1976; 1981   "open"  . CARDIAC CATHETERIZATION N/A 11/20/2015   Procedure: Left Heart Cath and Coronary Angiography;  Surgeon: Belva Crome, MD;  Location: [place] [facility];  Service: Cardiovascular;  Laterality: N/A;  . CARDIAC CATHETERIZATION N/A 12/04/2015   Procedure: Left Heart Cath and Coronary Angiography;  Surgeon: Peter M Martinique, MD;   Location: [place];  Service: Cardiovascular;  Laterality: N/A;  . CARDIAC CATHETERIZATION  12/04/2015   Procedure: Coronary Stent Intervention;  Surgeon: Peter M Martinique, MD;  Location: [place] [facility];  Service: Cardiovascular;;  . COLONOSCOPY  09/28/2011   Procedure: COLONOSCOPY;  Surgeon: Juanita Craver, MD;  [facility];  Service: Endoscopy;  Laterality: N/A;  . COLONOSCOPY WITH PROPOFOL N/A 01/05/2017   Procedure: COLONOSCOPY WITH PROPOFOL;  Surgeon: Juanita Craver, MD;  [facility];  Service: Endoscopy;  Laterality: N/A;  . JOINT REPLACEMENT    . LEFT HEART CATH AND CORONARY ANGIOGRAPHY N/A 10/08/2016   Procedure: Left Heart Cath and Coronary Angiography;  Surgeon: Peter M Martinique, MD;  Location: [place] [facility];  Service: Cardiovascular;  Laterality: N/A;  . TOTAL ABDOMINAL HYSTERECTOMY  1998  . TOTAL KNEE ARTHROPLASTY Left 01/08/2014   Procedure: LEFT TOTAL KNEE ARTHROPLASTY;  Surgeon: Gearlean Alf, MD;  [facility];  Service: Orthopedics;  Laterality: Left;    There were no vitals filed for this visit.      Subjective Assessment - 02/22/17 1619    Subjective Pt reports that things are still the same since her evaluation. She has no pain currently unless she tries to turn her head.    Patient Stated Goals improve movement especially with driving and shopping; reduce pain   Currently in Pain? No/denies   Pain Onset More than a month ago  [facility] Adult PT Treatment/Exercise - 02/22/17 0001      Exercises   Exercises Neck     Neck Exercises: Seated   Other Seated Exercise thoracic lateral flexion x10 reps Lt/Rt     Neck Exercises: Sidelying   Other Sidelying Exercise thoracic rotation x10 reps each direction      Manual Therapy   Manual Therapy Soft tissue mobilization;Joint mobilization   Joint Mobilization Grade III/IV CPAs from C3 to T5 x2 bouts, lateral cervical glides from C3/C4 to C6   Soft  tissue mobilization Sub occipital release, STM cervical paraspinals     Neck Exercises: Stretches   Upper Trapezius Stretch 10 seconds;5 reps   Other Neck Stretches cervical rotation stretch with towel 5x10 sec each direction    Other Neck Stretches cervical extension stretch over towel 5x10 sec                 PT Education - 02/22/17 1707    Education provided Yes   Education Details implemented and reviewe HEP for cervical ROM; discussed implications for manual techniques and possible soreness following session    Person(s) Educated Patient   Methods Explanation;Verbal cues;Handout   Comprehension Verbalized understanding;Returned demonstration          PT Short Term Goals - 02/18/17 0922      PT SHORT TERM GOAL #1   Title indpendent with initial HEP   Time 4   Period Weeks   Status New   Target Date 04/15/17     PT SHORT TERM GOAL #2   Title ability to shop looking at the signs with moderate difficutly due to improved cervical motion   Time 4   Period Weeks   Status New   Target Date 03/18/17     PT SHORT TERM GOAL #3   Title ability to turn head with driving with pain decreased >/= 25% due to improved mobility   Time 4   Period Weeks   Status New   Target Date 03/18/17           PT Long Term Goals - 02/18/17 0918      PT LONG TERM GOAL #1   Title independent with HEP   Time 8   Period Weeks   Status New   Target Date 04/15/17     PT LONG TERM GOAL #2   Title ability to turn her head without difficulty while driving due to improved  cervical ROM   Time 8   Period Weeks   Status New   Target Date 04/15/17     PT LONG TERM GOAL #3   Title abilty to shop in grocery store with looking at the signs with minimal to no difficulty and no headache   Time 8   Period Weeks   Status New   Target Date 04/15/17     PT LONG TERM GOAL #4   Title able to move head while working between 2 monitors with minimal to no difficulty due to improve cervical  ROM   Time 8   Period Weeks   Status New   Target Date 04/15/17     PT LONG TERM GOAL #5   Title FOTO score </= 39% limitation   Time 8   Period Weeks   Status New   Target Date 04/15/17               Plan - 02/22/17 1708    Clinical Impression Statement Pt arrived reporting no changes since  her last session, however this is only her first treatment since the evaluation. Therapist implemented and reviewed a HEP to encourage cervical AROM at home and ended session with manual techniques to address limitations in both soft tissue and joint mobility. Ended session with mild improvements in cervical PROM into lateral flexion primarily, pt reporting no increase in her symptoms. Will continue with current POC.    Rehab Potential Excellent   Clinical Impairments Affecting Rehab Potential hypothyroidism; cardiac history   PT Frequency 2x / week   PT Duration 8 weeks   PT Treatment/Interventions Cryotherapy;Electrical Stimulation;Moist Heat;Traction;Ultrasound;Therapeutic activities;Therapeutic exercise;Neuromuscular re-education;Patient/family education;Passive range of motion;Manual techniques;Dry needling   PT Next Visit Plan conitnue with soft tissue work; joint mobilization of cervical spine; posture therex; cervical ROM exercises   PT Home Exercise Plan cervical stretches: upper trap, rotation with towel, extension with towel    Consulted and Agree with Plan of Care Patient      Patient will benefit from skilled therapeutic intervention in order to improve the following deficits and impairments:  Decreased range of motion, Increased fascial restricitons, Pain, Decreased activity tolerance, Decreased mobility  Visit Diagnosis: Cervicalgia  Cramp and spasm     Problem List Patient Active Problem List   Diagnosis Date Noted  . CAD in native artery 12/04/2015  . Cardiomyopathy, ischemic 12/04/2015  . CAD (coronary artery disease), native coronary artery 12/04/2015  .  History of pulmonary embolism 11/25/2015  . Lactic acidosis 11/25/2015  . AKI (acute kidney injury) ([place]) 11/25/2015  . Aspiration pneumonia ([place]) 11/25/2015  . Hyperglycemia 11/25/2015  . Normocytic anemia 11/25/2015  . Acute respiratory failure with hypoxia ([place])   . Cardiogenic shock ([place])   . NSTEMI (non-ST elevated myocardial infarction) ([place])   . Cardiac arrest ([place]) 11/19/2015  . Angina pectoris ([place]) 08/28/2015  . Retinal detachment 05/07/2014  . OA (osteoarthritis) of knee 01/08/2014  . Preoperative respiratory examination 11/22/2013  . Pulmonary embolism ([place]) 04/18/2013  . Chronic back pain 04/18/2013  . Dyspnea on exertion 04/06/2013  . 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 Hypothyroidism   . Hypercholesterolemia     5:11 PM,02/22/17 Elly Modena PT, [facility] [address] [place], [place], [address] Phone: [phone]   Fax:  [phone]  Name: FUMIYE LUBBEN MRN: [id number] Date of Birth: 07-21-1950

## 2017-02-24 MED FILL — MORPHINE SULF ER 30 MG TAB: 30 | 30 days supply | Qty: 60 | Fill #0

## 2017-02-25 ENCOUNTER — Ambulatory Visit: Payer: 59 | Admitting: Physical Therapy

## 2017-02-25 DIAGNOSIS — M542 Cervicalgia: Secondary | ICD-10-CM

## 2017-02-25 DIAGNOSIS — R252 Cramp and spasm: Secondary | ICD-10-CM

## 2017-02-25 NOTE — Therapy (Signed)
[facility] [address] [place], [place], [address] Phone: [phone]   Fax:  [phone]  Physical Therapy Treatment  Patient Details  Name: Brittany Mason MRN: [id number] Date of Birth: 1950-08-04 Referring Provider: Dr. Latanya Maudlin  Encounter Date: 02/25/2017      PT End of Session - 02/25/17 1651    Visit Number 3   Date for PT Re-Evaluation 04/15/17   Authorization Type UMR   PT Start Time 1618   PT Stop Time 4315   PT Time Calculation (min) 40 min   Activity Tolerance Patient tolerated treatment well;No increased pain   Behavior During Therapy WFL for tasks assessed/performed      Past Medical History:  Diagnosis Date  . Acute pulmonary embolism ([place]) 06/20/2015  . Arthritis    osteoarthritis-knees. Chronic back pain  . Basal cell carcinoma of right shoulder    "burned off"  . Chronic SI joint pain   . DVT (deep venous thrombosis) ([place]) 04/2013   left lower leg, resulted in Pulmonary emboli on hormone therapy  . Dyspnea on exertion   . GERD (gastroesophageal reflux disease)   . Hypercholesterolemia   . Hypothyroidism   . Migraine    "< once/month" (12/04/2015)  . MVP (mitral valve prolapse)    asymptomatic  . Myocardial infarction ([place]) 11/19/2015   cardiac arrest  . PE (pulmonary embolism) Apr 18, 2013   tx. -using Xarelto now, no further lung problems, denies SOB on 01-02-14  . PONV (postoperative nausea and vomiting)   . RLS (restless legs syndrome)     Past Surgical History:  Procedure Laterality Date  . ANTERIOR CRUCIATE LIGAMENT REPAIR Left 1976; 1981   "open"  . CARDIAC CATHETERIZATION N/A 11/20/2015   Procedure: Left Heart Cath and Coronary Angiography;  Surgeon: Belva Crome, MD;  Location: [place] [facility];  Service: Cardiovascular;  Laterality: N/A;  . CARDIAC CATHETERIZATION N/A 12/04/2015   Procedure: Left Heart Cath and Coronary Angiography;  Surgeon: Peter M Martinique, MD;   Location: [facility];  Service: Cardiovascular;  Laterality: N/A;  . CARDIAC CATHETERIZATION  12/04/2015   Procedure: Coronary Stent Intervention;  Surgeon: Peter M Martinique, MD;  Location: [place] [facility];  Service: Cardiovascular;;  . COLONOSCOPY  09/28/2011   Procedure: COLONOSCOPY;  Surgeon: Juanita Craver, MD;  Location: [facility];  Service: Endoscopy;  Laterality: N/A;  . COLONOSCOPY WITH PROPOFOL N/A 01/05/2017   Procedure: COLONOSCOPY WITH PROPOFOL;  Surgeon: Juanita Craver, MD;  Location: [facility];  Service: Endoscopy;  Laterality: N/A;  . JOINT REPLACEMENT    . LEFT HEART CATH AND CORONARY ANGIOGRAPHY N/A 10/08/2016   Procedure: Left Heart Cath and Coronary Angiography;  Surgeon: Peter M Martinique, MD;  Location: [place] [facility];  Service: Cardiovascular;  Laterality: N/A;  . TOTAL ABDOMINAL HYSTERECTOMY  1998  . TOTAL KNEE ARTHROPLASTY Left 01/08/2014   Procedure: LEFT TOTAL KNEE ARTHROPLASTY;  Surgeon: Gearlean Alf, MD;  Location: [facility];  Service: Orthopedics;  Laterality: Left;    There were no vitals filed for this visit.      Subjective Assessment - 02/25/17 1621    Subjective Pt reports that she is still stiff and has had a small headache all day at work.    Patient Stated Goals improve movement especially with driving and shopping; reduce pain   Currently in Pain? Yes   Pain Score 3    Pain Location Neck   Pain Orientation Right;Left  More on the Rt    Pain Descriptors / Indicators Aching;Tightness   Pain Type Chronic pain   Pain Radiating Towards none    Pain Onset More than a month ago   Pain Frequency Intermittent   Aggravating Factors  driving looking back and forth at computers at work    Pain Relieving Factors ice                         [facility] Adult PT Treatment/Exercise - 02/25/17 0001      Neck Exercises: Standing   Other Standing Exercises shoulder extension with red TB x15 reps; low rows x15 reps with red TB, foam roll to  encourage scapular retraction      Neck Exercises: Supine   Capital Flexion 20 reps;3 secs     Neck Exercises: Sidelying   Other Sidelying Exercise thoracic rotation x15 reps each direction      Manual Therapy   Manual Therapy Myofascial release;Passive ROM   Joint Mobilization Lt rotational C1/C2 mobilization    Soft tissue mobilization STM cervical paraspinals    Myofascial Release sub occipital release    Passive ROM PROM into Lt and Rt lateral rotation, head resting on physioball, gentle cervical traction x20 reps      Neck Exercises: Stretches   Other Neck Stretches cervical rotation stretch with towel 5x10 sec each direction                 PT Education - 02/25/17 1650    Education provided Yes   Education Details technique with therex   Person(s) Educated Patient   Methods Explanation;Verbal cues   Comprehension Verbalized understanding;Returned demonstration          PT Short Term Goals - 02/18/17 0922      PT SHORT TERM GOAL #1   Title indpendent with initial HEP   Time 4   Period Weeks   Status New   Target Date 04/15/17     PT SHORT TERM GOAL #2   Title ability to shop looking at the signs with moderate difficutly due to improved cervical motion   Time 4   Period Weeks   Status New   Target Date 03/18/17     PT SHORT TERM GOAL #3   Title ability to turn head with driving with pain decreased >/= 25% due to improved mobility   Time 4   Period Weeks   Status New   Target Date 03/18/17           PT Long Term Goals - 02/18/17 0918      PT LONG TERM GOAL #1   Title independent with HEP   Time 8   Period Weeks   Status New   Target Date 04/15/17     PT LONG TERM GOAL #2   Title ability to turn her head without difficulty while driving due to improved  cervical ROM   Time 8   Period Weeks   Status New   Target Date 04/15/17     PT LONG TERM GOAL #3   Title abilty to shop in grocery store with looking at the signs with minimal to  no difficulty and no headache   Time 8   Period Weeks   Status New   Target Date 04/15/17     PT LONG TERM GOAL #4   Title able to move head while working between 2 monitors with minimal to no difficulty due to improve  cervical ROM   Time 8   Period Weeks   Status New   Target Date 04/15/17     PT LONG TERM GOAL #5   Title FOTO score </= 39% limitation   Time 8   Period Weeks   Status New   Target Date 04/15/17               Plan - 02/25/17 1652    Clinical Impression Statement Pt continues to report cervical stiffness into Lt rotation primarily. Completed therex to improve postural strength and thoracic/cervical mobility. Pt demonstrated improvements in cervical rotation to the Lt following manual PROM and myofascial release to the suboccipitals and upper cervical region and felt that she could turn her head much further than she previously could by the end of today's session. Will continue with current POC.    Rehab Potential Excellent   Clinical Impairments Affecting Rehab Potential hypothyroidism; cardiac history   PT Frequency 2x / week   PT Duration 8 weeks   PT Treatment/Interventions Cryotherapy;Electrical Stimulation;Moist Heat;Traction;Ultrasound;Therapeutic activities;Therapeutic exercise;Neuromuscular re-education;Patient/family education;Passive range of motion;Manual techniques;Dry needling   PT Next Visit Plan conitnue with soft tissue work and joint mobilization of cervical spine; posture therex; cervical ROM exercises   PT Home Exercise Plan cervical stretches: upper trap, rotation with towel, extension with towel    Consulted and Agree with Plan of Care Patient      Patient will benefit from skilled therapeutic intervention in order to improve the following deficits and impairments:  Decreased range of motion, Increased fascial restricitons, Pain, Decreased activity tolerance, Decreased mobility  Visit Diagnosis: Cervicalgia  Cramp and  spasm     Problem List Patient Active Problem List   Diagnosis Date Noted  . CAD in native artery 12/04/2015  . Cardiomyopathy, ischemic 12/04/2015  . CAD (coronary artery disease), native coronary artery 12/04/2015  . History of pulmonary embolism 11/25/2015  . Lactic acidosis 11/25/2015  . AKI (acute kidney injury) ([place]) 11/25/2015  . Aspiration pneumonia ([place]) 11/25/2015  . Hyperglycemia 11/25/2015  . Normocytic anemia 11/25/2015  . Acute respiratory failure with hypoxia ([place])   . Cardiogenic shock ([place])   . NSTEMI (non-ST elevated myocardial infarction) ([place])   . Cardiac arrest ([place]) 11/19/2015  . Angina pectoris ([place]) 08/28/2015  . Retinal detachment 05/07/2014  . OA (osteoarthritis) of knee 01/08/2014  . Preoperative respiratory examination 11/22/2013  . Pulmonary embolism ([place]) 04/18/2013  . Chronic back pain 04/18/2013  . Dyspnea on exertion 04/06/2013  . 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 Hypothyroidism   . Hypercholesterolemia     5:03 PM,02/25/17 Elly Modena PT, [place] [facility] [place] [address] [place], [place], [address] Phone: [phone]   Fax:  [phone]  Name: Brittany Mason MRN: [id number] Date of Birth: 12-11-1950

## 2017-03-01 ENCOUNTER — Ambulatory Visit: Payer: 59 | Admitting: Physical Therapy

## 2017-03-01 DIAGNOSIS — R252 Cramp and spasm: Secondary | ICD-10-CM | POA: Diagnosis not present

## 2017-03-01 DIAGNOSIS — M542 Cervicalgia: Secondary | ICD-10-CM | POA: Diagnosis not present

## 2017-03-01 MED FILL — CEPHALEXIN 500 MG CAPSULE: 500 | 7 days supply | Qty: 14 | Fill #0

## 2017-03-01 NOTE — Therapy (Signed)
[facility] [address], [place], [place], [address] Phone: [phone]   Fax:  [phone]  Physical Therapy Treatment  Patient Details  Name: Brittany Mason MRN: [id number] Date of Birth: 07-08-1950 Referring Provider: Dr. Latanya Maudlin  Encounter Date: 03/01/2017      PT End of Session - 03/01/17 1704    Visit Number 4   Date for PT Re-Evaluation 04/15/17   Authorization Type UMR   PT Start Time 3335   PT Stop Time 4562   PT Time Calculation (min) 41 min   Activity Tolerance Patient tolerated treatment well;No increased pain   Behavior During Therapy WFL for tasks assessed/performed      Past Medical History:  Diagnosis Date  . Acute pulmonary embolism ([place]) 06/20/2015  . Arthritis    osteoarthritis-knees. Chronic back pain  . Basal cell carcinoma of right shoulder    "burned off"  . Chronic SI joint pain   . DVT (deep venous thrombosis) ([place]) 04/2013   left lower leg, resulted in Pulmonary emboli on hormone therapy  . Dyspnea on exertion   . GERD (gastroesophageal reflux disease)   . Hypercholesterolemia   . Hypothyroidism   . Migraine    "< once/month" (12/04/2015)  . MVP (mitral valve prolapse)    asymptomatic  . Myocardial infarction ([place]) 11/19/2015   cardiac arrest  . PE (pulmonary embolism) Apr 18, 2013   tx. -using Xarelto now, no further lung problems, denies SOB on 01-02-14  . PONV (postoperative nausea and vomiting)   . RLS (restless legs syndrome)     Past Surgical History:  Procedure Laterality Date  . ANTERIOR CRUCIATE LIGAMENT REPAIR Left 1976; 1981   "open"  . CARDIAC CATHETERIZATION N/A 11/20/2015   Procedure: Left Heart Cath and Coronary Angiography;  Surgeon: Belva Crome, MD;  Location: [place] [facility];  Service: Cardiovascular;  Laterality: N/A;  . CARDIAC CATHETERIZATION N/A 12/04/2015   Procedure: Left Heart Cath and Coronary Angiography;  Surgeon: Peter M Martinique, MD;   Location: [place] [facility];  Service: Cardiovascular;  Laterality: N/A;  . CARDIAC CATHETERIZATION  12/04/2015   Procedure: Coronary Stent Intervention;  Surgeon: Peter M Martinique, MD;  Location: [place] [facility];  Service: Cardiovascular;;  . COLONOSCOPY  09/28/2011   Procedure: COLONOSCOPY;  Surgeon: Juanita Craver, MD;  [facility];  Service: Endoscopy;  Laterality: N/A;  . COLONOSCOPY WITH PROPOFOL N/A 01/05/2017   Procedure: COLONOSCOPY WITH PROPOFOL;  Surgeon: Juanita Craver, MD;  [facility];  Service: Endoscopy;  Laterality: N/A;  . JOINT REPLACEMENT    . LEFT HEART CATH AND CORONARY ANGIOGRAPHY N/A 10/08/2016   Procedure: Left Heart Cath and Coronary Angiography;  Surgeon: Peter M Martinique, MD;  Location: [facility];  Service: Cardiovascular;  Laterality: N/A;  . TOTAL ABDOMINAL HYSTERECTOMY  1998  . TOTAL KNEE ARTHROPLASTY Left 01/08/2014   Procedure: LEFT TOTAL KNEE ARTHROPLASTY;  Surgeon: Gearlean Alf, MD;  [facility];  Service: Orthopedics;  Laterality: Left;    There were no vitals filed for this visit.      Subjective Assessment - 03/01/17 1618    Subjective Pt reports that her neck is still stiff and she was a little sore following her last session. She thinks she might be getting a sinus infection.    Patient Stated Goals improve movement especially with driving and shopping; reduce pain   Currently in Pain? Yes   Pain Score 3  Pain Location Shoulder   Pain Orientation Right   Pain Descriptors / Indicators Aching;Dull   Pain Type Chronic pain   Pain Radiating Towards none    Pain Onset More than a month ago   Pain Frequency Intermittent   Aggravating Factors  turning her head alot             [facility] PT Assessment - 03/01/17 0001      AROM   Cervical - Right Rotation 55 deg   Cervical - Left Rotation 40 deg                     [facility] Adult PT Treatment/Exercise - 03/01/17 0001      Neck Exercises: Seated    Cervical Rotation 15 reps;Both   Lateral Flexion 15 reps;Both   Other Seated Exercise 1st rib MET x15 reps Lt and Rt    Other Seated Exercise scap retraction x20 reps, heat applied to upper trap region      Neck Exercises: Supine   Capital Flexion 10 reps;3 secs   Capital Flexion Limitations during gentle manual traction     Manual Therapy   Joint Mobilization Lt rotational C1/C2 mobilization    Myofascial Release TPR Rt upper trap, Rt levator scap; sub occipital release    Passive ROM PROM into Rt and Lt lateral flexion x10 reps                 PT Education - 03/01/17 1703    Education provided Yes   Education Details discussed noted improvements in cervical ROM; answered pt questions regarding technique with HEP    Person(s) Educated Patient   Methods Explanation   Comprehension Verbalized understanding          PT Short Term Goals - 02/18/17 0922      PT SHORT TERM GOAL #1   Title indpendent with initial HEP   Time 4   Period Weeks   Status New   Target Date 04/15/17     PT SHORT TERM GOAL #2   Title ability to shop looking at the signs with moderate difficutly due to improved cervical motion   Time 4   Period Weeks   Status New   Target Date 03/18/17     PT SHORT TERM GOAL #3   Title ability to turn head with driving with pain decreased >/= 25% due to improved mobility   Time 4   Period Weeks   Status New   Target Date 03/18/17           PT Long Term Goals - 02/18/17 0918      PT LONG TERM GOAL #1   Title independent with HEP   Time 8   Period Weeks   Status New   Target Date 04/15/17     PT LONG TERM GOAL #2   Title ability to turn her head without difficulty while driving due to improved  cervical ROM   Time 8   Period Weeks   Status New   Target Date 04/15/17     PT LONG TERM GOAL #3   Title abilty to shop in grocery store with looking at the signs with minimal to no difficulty and no headache   Time 8   Period Weeks   Status  New   Target Date 04/15/17     PT LONG TERM GOAL #4   Title able to move head while working between 2 monitors with minimal to no  difficulty due to improve cervical ROM   Time 8   Period Weeks   Status New   Target Date 04/15/17     PT LONG TERM GOAL #5   Title FOTO score </= 39% limitation   Time 8   Period Weeks   Status New   Target Date 04/15/17               Plan - 03/01/17 1705    Clinical Impression Statement Pt making steady progress towards her goals with improving cervical rotation ROM up to ~40/45 deg of Lt cervical rotation. Continued with therex and manual techniques to address remaining limitations in ROM/strength, pt reporting good improvements in stiffness along her Rt upper trap following this. Ended session with improved tightness and will continue with current POC to progress pt towards her goals.   Rehab Potential Excellent   Clinical Impairments Affecting Rehab Potential hypothyroidism; cardiac history   PT Frequency 2x / week   PT Duration 8 weeks   PT Treatment/Interventions Cryotherapy;Electrical Stimulation;Moist Heat;Traction;Ultrasound;Therapeutic activities;Therapeutic exercise;Neuromuscular re-education;Patient/family education;Passive range of motion;Manual techniques;Dry needling   PT Next Visit Plan conitnue with soft tissue work and joint mobilization of cervical spine to improve rotation; posture therex; cervical ROM exercises   PT Home Exercise Plan cervical stretches: upper trap, rotation with towel, extension with towel    Consulted and Agree with Plan of Care Patient      Patient will benefit from skilled therapeutic intervention in order to improve the following deficits and impairments:  Decreased range of motion, Increased fascial restricitons, Pain, Decreased activity tolerance, Decreased mobility  Visit Diagnosis: Cervicalgia  Cramp and spasm     Problem List Patient Active Problem List   Diagnosis Date Noted  . CAD in  native artery 12/04/2015  . Cardiomyopathy, ischemic 12/04/2015  . CAD (coronary artery disease), native coronary artery 12/04/2015  . History of pulmonary embolism 11/25/2015  . Lactic acidosis 11/25/2015  . AKI (acute kidney injury) ([place]) 11/25/2015  . Aspiration pneumonia ([employer]) 11/25/2015  . Hyperglycemia 11/25/2015  . Normocytic anemia 11/25/2015  . Acute respiratory failure with hypoxia ([place])   . Cardiogenic shock ([place])   . NSTEMI (non-ST elevated myocardial infarction) ([place])   . Cardiac arrest ([place]) 11/19/2015  . Angina pectoris ([place]) 08/28/2015  . Retinal detachment 05/07/2014  . OA (osteoarthritis) of knee 01/08/2014  . Preoperative respiratory examination 11/22/2013  . Pulmonary embolism ([place]) 04/18/2013  . Chronic back pain 04/18/2013  . Dyspnea on exertion 04/06/2013  . 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 Hypothyroidism   . Hypercholesterolemia     5:10 PM,03/01/17 Elly Modena PT, [facility] [address] [place], [place], [address] Phone: [phone]   Fax:  [phone]  Name: Brittany Mason MRN: [id number] Date of Birth: 08-10-1950

## 2017-03-02 NOTE — Progress Notes (Signed)
CARDIOLOGY OFFICE NOTE  Date:  03/04/2017    [name] Date of Birth: 01/09/1951 Medical Record #[id number]  PCP:  Hulan Fess, MD  Cardiologist:  Gillian Shields    No chief complaint on file.   History of Present Illness: JOHNNI WUNSCHEL is a 66 y.o. female who presents today for a follow up visit.  Previous  Patient of Dr Gwenlyn Found History of PE on HRT, low thyroid, and CAD. Had VF arrest with cath 6/28 99% proximal circumflex stenosis Rx DES Dr Martinique. Stented into large OM1 persistent distal LCx occlusion with right to left collaterals. Recurrent chest pain and repeat cath 10/08/16 no change Unable to cross distal circumflex with wire good right to left collaterals and patent stent Medical RX   Unable to tolerate lipitor and crestor. Zetia added to zocor tr try to achieve LDL goal Has some left neck pain sees ortho for Told her she could take tylenol Primary concerned about her SEM. Echo did not show any bad valve disease   Lab Results  Component Value Date   LDLCALC 66 11/18/2016     Still working at [employer] may need to start bringing [place] on line soon    Past Medical History:  Diagnosis Date  . Acute pulmonary embolism ([place]) 06/20/2015  . Arthritis    osteoarthritis-knees. Chronic back pain  . Basal cell carcinoma of right shoulder    "burned off"  . Chronic SI joint pain   . DVT (deep venous thrombosis) ([place]) 04/2013   left lower leg, resulted in Pulmonary emboli on hormone therapy  . Dyspnea on exertion   . GERD (gastroesophageal reflux disease)   . Hypercholesterolemia   . Hypothyroidism   . Migraine    "< once/month" (12/04/2015)  . MVP (mitral valve prolapse)    asymptomatic  . Myocardial infarction ([place]) 11/19/2015   cardiac arrest  . PE (pulmonary embolism) Apr 18, 2013   tx. -using Xarelto now, no further lung problems, denies SOB on 01-02-14  . PONV (postoperative nausea and vomiting)   . RLS (restless legs syndrome)     Past  Surgical History:  Procedure Laterality Date  . ANTERIOR CRUCIATE LIGAMENT REPAIR Left 1976; 1981   "open"  . CARDIAC CATHETERIZATION N/A 11/20/2015   Procedure: Left Heart Cath and Coronary Angiography;  Surgeon: Belva Crome, MD;  Location: [place] [facility];  Service: Cardiovascular;  Laterality: N/A;  . CARDIAC CATHETERIZATION N/A 12/04/2015   Procedure: Left Heart Cath and Coronary Angiography;  Surgeon: Jennifer Payes M Martinique, MD;  Location: [facility];  Service: Cardiovascular;  Laterality: N/A;  . CARDIAC CATHETERIZATION  12/04/2015   Procedure: Coronary Stent Intervention;  Surgeon: Earnesteen Birnie M Martinique, MD;  Location: [place] [facility];  Service: Cardiovascular;;  . COLONOSCOPY  09/28/2011   Procedure: COLONOSCOPY;  Surgeon: Juanita Craver, MD;  [facility];  Service: Endoscopy;  Laterality: N/A;  . COLONOSCOPY WITH PROPOFOL N/A 01/05/2017   Procedure: COLONOSCOPY WITH PROPOFOL;  Surgeon: Juanita Craver, MD;  [facility]: [facility];  Service: Endoscopy;  Laterality: N/A;  . JOINT REPLACEMENT    . LEFT HEART CATH AND CORONARY ANGIOGRAPHY N/A 10/08/2016   Procedure: Left Heart Cath and Coronary Angiography;  Surgeon: Dailen Mcclish M Martinique, MD;  Location: [facility];  Service: Cardiovascular;  Laterality: N/A;  . TOTAL ABDOMINAL HYSTERECTOMY  1998  . TOTAL KNEE ARTHROPLASTY Left 01/08/2014   Procedure: LEFT TOTAL KNEE ARTHROPLASTY;  Surgeon: Gearlean Alf,  MD;  Location: [facility];  Service: Orthopedics;  Laterality: Left;     Medications: Current Outpatient Prescriptions  Medication Sig Dispense Refill  . butalbital-acetaminophen-caffeine (FIORICET, ESGIC) 50-325-40 MG tablet Take 1 tablet by mouth 2 (two) times daily as needed for headache.    . clopidogrel (PLAVIX) 75 MG tablet Take 75 mg by mouth daily.    . cyclobenzaprine (FLEXERIL) 5 MG tablet Take 5 mg by mouth 3 (three) times daily as needed for muscle spasms.    Marland Kitchen escitalopram (LEXAPRO) 20 MG tablet Take 20 mg by mouth every  morning.     . ezetimibe (ZETIA) 10 MG tablet Take 1 tablet (10 mg total) by mouth daily. 90 tablet 3  . fenofibrate 160 MG tablet Take 160 mg by mouth at bedtime.     . isosorbide mononitrate (IMDUR) 30 MG 24 hr tablet Take 1 tablet (30 mg total) by mouth daily. 90 tablet 3  . levothyroxine (SYNTHROID, LEVOTHROID) 150 MCG tablet Take 150 mcg by mouth daily before breakfast.     . linaclotide (LINZESS) 290 MCG CAPS capsule Take 290 mcg by mouth every evening. Taking for 8 days prior to procedure    . metoprolol tartrate (LOPRESSOR) 25 MG tablet Take 0.5 tablets (12.5 mg total) by mouth 2 (two) times daily. 90 tablet 3  . morphine (MSIR) 30 MG tablet Take 30 mg by mouth 2 (two) times daily as needed for moderate pain or severe pain (takes 1 tablet every morning and additonal dose onl;y if needed).     . nitroGLYCERIN (NITROSTAT) 0.4 MG SL tablet Place 1 tablet (0.4 mg total) under the tongue every 5 (five) minutes as needed for chest pain. 25 tablet 3  . omega-3 acid ethyl esters (LOVAZA) 1 G capsule Take 2 g by mouth 2 (two) times daily.    Marland Kitchen OVER THE COUNTER MEDICATION Take 2 capsules by mouth daily at 6 PM. "Relizen" herbal supplement for hormone replacement     . Polyethyl Glycol-Propyl Glycol (SYSTANE OP) Place 1-2 drops into both eyes daily as needed (red/itchy eyes).    Marland Kitchen rOPINIRole (REQUIP) 0.5 MG tablet Take 0.25 mg by mouth See admin instructions. Takes every night between 8-9 pm , takes a 1/2 tablet every night but sometimes takes 0.5 tablet if having major symptoms of restless legs    . scopolamine (TRANSDERM-SCOP) 1 MG/3DAYS Place 1 patch onto the skin as needed (for travel).    . simvastatin (ZOCOR) 40 MG tablet Take 1 tablet (40 mg total) by mouth at bedtime. 90 tablet 3   No current facility-administered medications for this visit.     Allergies: Allergies  Allergen Reactions  . Codeine Nausea And Vomiting    Takes hydrocodone; if takes doses too close together, states  "violently throws up"  . Erythromycin Hives and Itching    Social History: The patient  reports that she has never smoked. She has never used smokeless tobacco. She reports that she does not drink alcohol or use drugs.   Family History: [name] family history includes Breast cancer in her maternal aunt and paternal aunt; Cancer in her maternal aunt; Congestive Heart Failure in her mother; Coronary artery disease in her father; Hypertension in her father and mother; Stroke in her brother.   Review of Systems: Please see the history of present illness.   Otherwise, the review of systems is positive for none.   All other systems are reviewed and negative.   Physical Exam: VS:  BP 116/70  Pulse 64   Ht 5\' 3"  (1.6 m)   Wt 181 lb (82.1 kg)   SpO2 95%   BMI 32.06 kg/m  .  BMI Body mass index is 32.06 kg/m.  Wt Readings from Last 3 Encounters:  03/04/17 181 lb (82.1 kg)  01/05/17 173 lb (78.5 kg)  10/08/16 173 lb (78.5 kg)    Affect appropriate Healthy:  appears stated age HEENT: normal Neck supple with no adenopathy JVP normal no bruits no thyromegaly Lungs clear with no wheezing and good diaphragmatic motion Heart:  S1/S2 2/6 SEM  murmur, no rub, gallop or click PMI normal Abdomen: benighn, BS positve, no tenderness, no AAA no bruit.  No HSM or HJR Distal pulses intact with no bruits No edema Neuro non-focal Skin warm and dry No muscular weakness    LABORATORY DATA:  EKG:   SR PVC;s 10/08/16   Lab Results  Component Value Date   WBC 5.9 10/05/2016   HGB 12.9 10/05/2016   HCT 39.3 10/05/2016   PLT 294 10/05/2016   GLUCOSE 98 10/05/2016   CHOL 138 11/18/2016   TRIG 79 11/18/2016   HDL 56 11/18/2016   LDLCALC 66 11/18/2016   ALT 22 09/08/2016   AST 33 09/08/2016   NA 137 10/05/2016   K 4.4 10/05/2016   CL 99 10/05/2016   CREATININE 0.65 10/05/2016   BUN 12 10/05/2016   CO2 23 10/05/2016   TSH 0.39 (L) 01/20/2016   INR 1.0 10/05/2016   HGBA1C 5.7 (H)  11/19/2015    BNP (last 3 results) No results for input(s): BNP in the last 8760 hours.  ProBNP (last 3 results) No results for input(s): PROBNP in the last 8760 hours.   Other Studies Reviewed Today:  Cardiac Cath/PCI Conclusion from 12/04/15     Prox RCA to Mid RCA lesion, 30% stenosed.  RPDA lesion, 60% stenosed.  Prox Cx to Mid Cx lesion, 100% stenosed.  There is mild left ventricular systolic dysfunction.  Prox Cx lesion, 99% stenosed. Post intervention, there is a 0% residual stenosis.  1. Single vessel obstructive CAD 2. Mild LV dysfunction. 3. Low LVEDP 4. Successful stenting of the proximal LCx into a large bifurcating OM1. The distal LCx is still occluded with right to left collaterals. This branch is relatively small.   Plan: DAPT for one year. Will stop Lasix. Given improvement in EF would not recommend Life Vest at this point. Anticipate DC in am.      Cardiac Catheterization: 11/20/2015  Prox RCA to Dist RCA lesion, 45% stenosed.  RPDA lesion, 65% stenosed.  Prox Cx to Mid Cx lesion, 100% stenosed.   Total occlusion (Acute vs Chronic) of the proximal circumflex with collaterals from left to left and right to left. The collaterals appear to be meager however, the patient is on the cooling protocol and I suspect that collateralization will significantly improved once the patient is back to body temperature. If angina develops post arrest, recanalization of the circumflex coronary artery would be reasonable.  Diffuse moderate stenosis in a large PDA, up to 70% obstruction. Relatively small distribution LAD that reaches, but does not wrap around, the left ventricular apex. No significant obstruction is noted in the LAD.  Mid inferior wall moderate hypokinesis. Estimated ejection fraction 45-50%. Marked elevation in left ventricular end-diastolic pressure of 40 mmHg secondary to acute ischemia and cooling.   Recommendations:  Conservative medical  management for the time being.  I would add Plavix to the patient's medical regimen.  Should the patient have a significant neurological recovery, angina/ongoing evidence of ischemia could be treated with circumflex PCI. We chose against PCI today given cooling, vasoconstriction related to cooling, and clinical stability at the present time without evidence of ongoing ischemia.   Echocardiogram: 11/20/2015 Study Conclusions  - Left ventricle: The cavity size was normal. There was mild focal  basal hypertrophy of the septum. Systolic function was moderately  reduced. The estimated ejection fraction was in the range of 35%  to 40%. Diffuse hypokinesis. There is akinesis of the  mid-apicalinferolateral and inferior myocardium. Doppler  parameters are consistent with abnormal left ventricular  relaxation (grade 1 diastolic dysfunction). - Aortic valve: Trileaflet; mildly thickened, mildly calcified  leaflets. There was mild regurgitation directed eccentrically in  the LVOT. - Aorta: Aortic root dimension: 38 mm (ED). - Ascending aorta: The ascending aorta was mildly dilated. - Mitral valve: Calcified annulus. - Right ventricle: The cavity size was moderately dilated. Wall  thickness was normal. Systolic function was moderately reduced.  Assessment/Plan: 1. V-fib Cardiac Arrest/ NSTEMI - with LV dysfunction - Post DES to proximal circumflex into OM1 with June 2017  Total distal circumflex occlusion right to left collaterals. Patent stent cath repeat 10/08/16 continue medical Rx  2. Ischemic CM -   Seen by EP and no AICD indicated  EF 45-50% by echo 04/10/16    3. HLD - continue zocor and zetia LDL at goal   4. Elevated LFTs - improved on last recheck.    5. Short term memory issues - greatly improved.   6. PVCs - seen by Dr Curt Bears continue beta blocker   7. Murmur: benign SEM echo 04/2016 only trivial AR     Jenkins Rouge

## 2017-03-03 MED FILL — EZETIMIBE 10 MG TABLET: 10 | 90 days supply | Qty: 90 | Fill #1

## 2017-03-04 ENCOUNTER — Encounter: Payer: Self-pay | Admitting: Cardiovascular Disease

## 2017-03-04 ENCOUNTER — Ambulatory Visit (INDEPENDENT_AMBULATORY_CARE_PROVIDER_SITE_OTHER): Payer: 59 | Admitting: Cardiovascular Disease

## 2017-03-04 VITALS — BP 116/70 | HR 64 | Ht 63.0 in | Wt 181.0 lb

## 2017-03-04 DIAGNOSIS — I2510 Atherosclerotic heart disease of native coronary artery without angina pectoris: Secondary | ICD-10-CM

## 2017-03-04 DIAGNOSIS — E7800 Pure hypercholesterolemia, unspecified: Secondary | ICD-10-CM

## 2017-03-04 DIAGNOSIS — I255 Ischemic cardiomyopathy: Secondary | ICD-10-CM | POA: Diagnosis not present

## 2017-03-04 NOTE — Patient Instructions (Addendum)

## 2017-03-05 ENCOUNTER — Ambulatory Visit: Payer: 59 | Admitting: Physical Therapy

## 2017-03-05 DIAGNOSIS — M542 Cervicalgia: Secondary | ICD-10-CM | POA: Diagnosis not present

## 2017-03-05 DIAGNOSIS — R252 Cramp and spasm: Secondary | ICD-10-CM

## 2017-03-05 NOTE — Therapy (Signed)
[facility] [address], [place], [place], [address] Phone: [phone]   Fax:  [phone]  Physical Therapy Treatment  Patient Details  Name: Brittany Mason MRN: [id number] Date of Birth: 1951-05-30 Referring Provider: Dr. Latanya Maudlin  Encounter Date: 03/05/2017      PT End of Session - 03/05/17 0823    Visit Number 5   Date for PT Re-Evaluation 04/15/17   Authorization Type UMR   PT Start Time 0800   PT Stop Time 4081   PT Time Calculation (min) 39 min   Activity Tolerance Patient tolerated treatment well;No increased pain   Behavior During Therapy WFL for tasks assessed/performed      Past Medical History:  Diagnosis Date  . Acute pulmonary embolism ([place]) 06/20/2015  . Arthritis    osteoarthritis-knees. Chronic back pain  . Basal cell carcinoma of right shoulder    "burned off"  . Chronic SI joint pain   . DVT (deep venous thrombosis) ([place]) 04/2013   left lower leg, resulted in Pulmonary emboli on hormone therapy  . Dyspnea on exertion   . GERD (gastroesophageal reflux disease)   . Hypercholesterolemia   . Hypothyroidism   . Migraine    "< once/month" (12/04/2015)  . MVP (mitral valve prolapse)    asymptomatic  . Myocardial infarction ([place]) 11/19/2015   cardiac arrest  . PE (pulmonary embolism) Apr 18, 2013   tx. -using Xarelto now, no further lung problems, denies SOB on 01-02-14  . PONV (postoperative nausea and vomiting)   . RLS (restless legs syndrome)     Past Surgical History:  Procedure Laterality Date  . ANTERIOR CRUCIATE LIGAMENT REPAIR Left 1976; 1981   "open"  . CARDIAC CATHETERIZATION N/A 11/20/2015   Procedure: Left Heart Cath and Coronary Angiography;  Surgeon: Belva Crome, MD;  Location: [place] [facility] [place];  Service: Cardiovascular;  Laterality: N/A;  . CARDIAC CATHETERIZATION N/A 12/04/2015   Procedure: Left Heart Cath and Coronary Angiography;  Surgeon: Peter M Martinique, MD;   Location: [place] [facility];  Service: Cardiovascular;  Laterality: N/A;  . CARDIAC CATHETERIZATION  12/04/2015   Procedure: Coronary Stent Intervention;  Surgeon: Peter M Martinique, MD;  Location: [facility];  Service: Cardiovascular;;  . COLONOSCOPY  09/28/2011   Procedure: COLONOSCOPY;  Surgeon: Juanita Craver, MD;  [facility];  Service: Endoscopy;  Laterality: N/A;  . COLONOSCOPY WITH PROPOFOL N/A 01/05/2017   Procedure: COLONOSCOPY WITH PROPOFOL;  Surgeon: Juanita Craver, MD;  [facility];  Service: Endoscopy;  Laterality: N/A;  . JOINT REPLACEMENT    . LEFT HEART CATH AND CORONARY ANGIOGRAPHY N/A 10/08/2016   Procedure: Left Heart Cath and Coronary Angiography;  Surgeon: Peter M Martinique, MD;  Location: [facility];  Service: Cardiovascular;  Laterality: N/A;  . TOTAL ABDOMINAL HYSTERECTOMY  1998  . TOTAL KNEE ARTHROPLASTY Left 01/08/2014   Procedure: LEFT TOTAL KNEE ARTHROPLASTY;  Surgeon: Gearlean Alf, MD;  [facility];  Service: Orthopedics;  Laterality: Left;    There were no vitals filed for this visit.      Subjective Assessment - 03/05/17 0759    Subjective Pt reports that things are going well. She has no pain or headache currently, but she did report some mild headache on Wednesday which went away after taking some medication.    Patient Stated Goals improve movement especially with driving and shopping; reduce pain   Currently in Pain? No/denies   Pain  Onset More than a month ago                 [facility] Adult PT Treatment/Exercise - 03/05/17 0001      Neck Exercises: Standing   Other Standing Exercises cervical rotation Rt and Lt x15 reps each, cues to maintain head against the wall and slight chin tuck     Neck Exercises: Supine   Cervical Rotation 10 reps;Left;Right   Cervical Rotation Limitations Rt: 65 deg without pain, Lt: 50 deg   Other Supine Exercise Shoulder circuit: horizontal abd x20 reps, BUE flexion with horiz. abd  hold into TB x15 reps, D2 flexion with each UE x15 reps  yellow TB   Other Supine Exercise cervical flexion hold x10sec, 5 reps     Manual Therapy   Joint Mobilization Lt rotational C1/C2 mobilization grade III/IV   Soft tissue mobilization Sub occipital release Rt>Lt   Passive ROM PROM into Lt and Rt cervical rotation x5 reps each; gentle traction hold x5 reps                 PT Education - 03/05/17 0821    Education provided Yes   Education Details improvements in cervical AROM, importance of addressing deep cervical muscle activation/control with head turns to allow for more/pain free motion   Person(s) Educated Patient   Methods Explanation   Comprehension Verbalized understanding          PT Short Term Goals - 02/18/17 0922      PT SHORT TERM GOAL #1   Title indpendent with initial HEP   Time 4   Period Weeks   Status New   Target Date 04/15/17     PT SHORT TERM GOAL #2   Title ability to shop looking at the signs with moderate difficutly due to improved cervical motion   Time 4   Period Weeks   Status New   Target Date 03/18/17     PT SHORT TERM GOAL #3   Title ability to turn head with driving with pain decreased >/= 25% due to improved mobility   Time 4   Period Weeks   Status New   Target Date 03/18/17           PT Long Term Goals - 02/18/17 0918      PT LONG TERM GOAL #1   Title independent with HEP   Time 8   Period Weeks   Status New   Target Date 04/15/17     PT LONG TERM GOAL #2   Title ability to turn her head without difficulty while driving due to improved  cervical ROM   Time 8   Period Weeks   Status New   Target Date 04/15/17     PT LONG TERM GOAL #3   Title abilty to shop in grocery store with looking at the signs with minimal to no difficulty and no headache   Time 8   Period Weeks   Status New   Target Date 04/15/17     PT LONG TERM GOAL #4   Title able to move head while working between 2 monitors with minimal  to no difficulty due to improve cervical ROM   Time 8   Period Weeks   Status New   Target Date 04/15/17     PT LONG TERM GOAL #5   Title FOTO score </= 39% limitation   Time 8   Period Weeks   Status New   Target  Date 04/15/17               Plan - 03/05/17 0824    Clinical Impression Statement Pt continues to make progress towards goals, reporting no increase in pain following her last session and reporting improved ability to turn and look at her coworkers during meetings. Session focused on manual techniques to further address rotation to Lt and Rt, noting ~10 deg improvement in a supine position particularly. Ended with therex to improve neuromuscular control of the scapula and cervical musculature, noting pt was able to complete with proper technique and intermittent cues/adjustments from the therapist. Pt reporting no pain by the end of today's session. Will continue with skilled PT to address limitations in ROM, strength and neuromuscular control.    Rehab Potential Excellent   Clinical Impairments Affecting Rehab Potential hypothyroidism; cardiac history   PT Frequency 2x / week   PT Duration 8 weeks   PT Treatment/Interventions Cryotherapy;Electrical Stimulation;Moist Heat;Traction;Ultrasound;Therapeutic activities;Therapeutic exercise;Neuromuscular re-education;Patient/family education;Passive range of motion;Manual techniques;Dry needling   PT Next Visit Plan conitnue with soft tissue work and joint mobilization of cervical spine to improve rotation; posture therex; cervical ROM exercises   PT Home Exercise Plan cervical stretches: upper trap, rotation with towel, extension with towel, supine cervical rotation    Consulted and Agree with Plan of Care Patient      Patient will benefit from skilled therapeutic intervention in order to improve the following deficits and impairments:  Decreased range of motion, Increased fascial restricitons, Pain, Decreased activity  tolerance, Decreased mobility  Visit Diagnosis: Cervicalgia  Cramp and spasm     Problem List Patient Active Problem List   Diagnosis Date Noted  . CAD in native artery 12/04/2015  . Cardiomyopathy, ischemic 12/04/2015  . CAD (coronary artery disease), native coronary artery 12/04/2015  . History of pulmonary embolism 11/25/2015  . Lactic acidosis 11/25/2015  . AKI (acute kidney injury) ([place]) 11/25/2015  . Aspiration pneumonia ([place]) 11/25/2015  . Hyperglycemia 11/25/2015  . Normocytic anemia 11/25/2015  . Acute respiratory failure with hypoxia ([place])   . Cardiogenic shock ([place])   . NSTEMI (non-ST elevated myocardial infarction) ([place])   . Cardiac arrest ([place]) 11/19/2015  . Angina pectoris ([place]) 08/28/2015  . Retinal detachment 05/07/2014  . OA (osteoarthritis) of knee 01/08/2014  . Preoperative respiratory examination 11/22/2013  . Pulmonary embolism ([place]) 04/18/2013  . Chronic back pain 04/18/2013  . Dyspnea on exertion 04/06/2013  . 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 Hypothyroidism   . Hypercholesterolemia     8:42 AM,03/05/17 Brittany Mason PT, [facility]  [facility] [address], [place], [place], [address] Phone: [phone]   Fax:  [phone]  Name: Brittany Mason MRN: [id number] Date of Birth: 02/05/1951

## 2017-03-08 ENCOUNTER — Ambulatory Visit: Payer: 59 | Attending: Orthopedic Surgery | Admitting: Physical Therapy

## 2017-03-08 ENCOUNTER — Encounter: Payer: Self-pay | Admitting: Physical Therapy

## 2017-03-08 DIAGNOSIS — M542 Cervicalgia: Secondary | ICD-10-CM | POA: Diagnosis not present

## 2017-03-08 DIAGNOSIS — R252 Cramp and spasm: Secondary | ICD-10-CM | POA: Diagnosis not present

## 2017-03-08 MED FILL — ISOSORBIDE MN ER 30 MG TAB: 30 | 90 days supply | Qty: 90 | Fill #2

## 2017-03-08 MED FILL — CLOPIDOGREL 75 MG TABLET: 75 | 90 days supply | Qty: 90 | Fill #1

## 2017-03-08 MED FILL — rOPINIRole HCL 0.5 MG TABS: 0.5 | 90 days supply | Qty: 90 | Fill #1

## 2017-03-08 NOTE — Therapy (Signed)
[facility] [address] [place], [place], [address] Phone: [phone]   Fax:  [phone]  Physical Therapy Treatment  Patient Details  Name: Brittany Mason MRN: [id number] Date of Birth: 10/02/1950 Referring Provider: Dr. Latanya Maudlin  Encounter Date: 03/08/2017      PT End of Session - 03/08/17 1315    Visit Number 6   Date for PT Re-Evaluation 04/15/17   Authorization Type UMR   PT Start Time 1230   PT Stop Time 1310   PT Time Calculation (min) 40 min   Activity Tolerance Patient tolerated treatment well   Behavior During Therapy [facility] for tasks assessed/performed      Past Medical History:  Diagnosis Date  . Acute pulmonary embolism ([place]) 06/20/2015  . Arthritis    osteoarthritis-knees. Chronic back pain  . Basal cell carcinoma of right shoulder    "burned off"  . Chronic SI joint pain   . DVT (deep venous thrombosis) ([place]) 04/2013   left lower leg, resulted in Pulmonary emboli on hormone therapy  . Dyspnea on exertion   . GERD (gastroesophageal reflux disease)   . Hypercholesterolemia   . Hypothyroidism   . Migraine    "< once/month" (12/04/2015)  . MVP (mitral valve prolapse)    asymptomatic  . Myocardial infarction ([place]) 11/19/2015   cardiac arrest  . PE (pulmonary embolism) Apr 18, 2013   tx. -using Xarelto now, no further lung problems, denies SOB on 01-02-14  . PONV (postoperative nausea and vomiting)   . RLS (restless legs syndrome)     Past Surgical History:  Procedure Laterality Date  . ANTERIOR CRUCIATE LIGAMENT REPAIR Left 1976; 1981   "open"  . CARDIAC CATHETERIZATION N/A 11/20/2015   Procedure: Left Heart Cath and Coronary Angiography;  Surgeon: Belva Crome, MD;  Location: [place] [facility];  Service: Cardiovascular;  Laterality: N/A;  . CARDIAC CATHETERIZATION N/A 12/04/2015   Procedure: Left Heart Cath and Coronary Angiography;  Surgeon: Peter M Martinique, MD;  Location: [place] [facility];  Service: Cardiovascular;  Laterality: N/A;  . CARDIAC CATHETERIZATION  12/04/2015   Procedure: Coronary Stent Intervention;  Surgeon: Peter M Martinique, MD;  Location: [place] [facility];  Service: Cardiovascular;;  . COLONOSCOPY  09/28/2011   Procedure: COLONOSCOPY;  Surgeon: Juanita Craver, MD;  [facility]: [facility];  Service: Endoscopy;  Laterality: N/A;  . COLONOSCOPY WITH PROPOFOL N/A 01/05/2017   Procedure: COLONOSCOPY WITH PROPOFOL;  Surgeon: Juanita Craver, MD;  [facility]: [facility];  Service: Endoscopy;  Laterality: N/A;  . JOINT REPLACEMENT    . LEFT HEART CATH AND CORONARY ANGIOGRAPHY N/A 10/08/2016   Procedure: Left Heart Cath and Coronary Angiography;  Surgeon: Peter M Martinique, MD;  Location: [place] [facility];  Service: Cardiovascular;  Laterality: N/A;  . TOTAL ABDOMINAL HYSTERECTOMY  1998  . TOTAL KNEE ARTHROPLASTY Left 01/08/2014   Procedure: LEFT TOTAL KNEE ARTHROPLASTY;  Surgeon: Gearlean Alf, MD;  Location: [facility];  Service: Orthopedics;  Laterality: Left;    There were no vitals filed for this visit.      Subjective Assessment - 03/08/17 1233    Subjective My neck is 30% better. I still get headaches intermittently. MOre stiffness than pain.    Patient Stated Goals improve movement especially with driving and shopping; reduce pain   Currently in Pain? Yes   Pain Score 2    Pain Location Neck   Pain Orientation Right   Pain Descriptors /  Indicators Aching;Dull   Pain Onset More than a month ago   Pain Frequency Intermittent   Aggravating Factors  turning head alot   Pain Relieving Factors ice   Multiple Pain Sites No                         [facility] Adult PT Treatment/Exercise - 03/08/17 0001      Neck Exercises: Machines for Strengthening   UBE (Upper Arm Bike) level 1 2 min forward/2 min. backward     Neck Exercises: Standing   Other Standing Exercises quadruped thread the needle to increase cervical and thoracic rotation   Other  Standing Exercises doorway stretch bil. hold 15 sec, 4x     Neck Exercises: Seated   Other Seated Exercise sitting trunk rotation with cervical rotation     Neck Exercises: Supine   Neck Retraction 5 reps;5 secs  head on red physioball   Capital Flexion 10 reps;3 secs   Cervical Rotation Both;20 reps  with assistance     Manual Therapy   Manual Therapy Joint mobilization;Soft tissue mobilization   Joint Mobilization gapping of C4-T3 bil in sitting and supine; sideglide of C4-T3   Soft tissue mobilization Sub occipital release Rt>Lt; scalenes; SCM; cervical and upper thoracic paraspinals                PT Education - 03/08/17 1314    Education provided Yes   Education Details doorway stretch and trunk rotation stretch   Person(s) Educated Patient   Methods Explanation;Demonstration;Verbal cues;Handout   Comprehension Returned demonstration;Verbalized understanding          PT Short Term Goals - 03/08/17 1238      PT SHORT TERM GOAL #1   Title indpendent with initial HEP   Time 4   Period Weeks   Status Achieved     PT SHORT TERM GOAL #2   Title ability to shop looking at the signs with moderate difficutly due to improved cervical motion   Time 4   Period Weeks   Status Achieved     PT SHORT TERM GOAL #3   Title ability to turn head with driving with pain decreased >/= 25% due to improved mobility   Baseline unable to do it   Period Weeks   Status On-going           PT Long Term Goals - 02/18/17 0918      PT LONG TERM GOAL #1   Title independent with HEP   Time 8   Period Weeks   Status New   [place] Date 04/15/17     PT LONG TERM GOAL #2   Title ability to turn her head without difficulty while driving due to improved  cervical ROM   Time 8   Period Weeks   Status New   Target Date 04/15/17     PT LONG TERM GOAL #3   Title abilty to shop in grocery store with looking at the signs with minimal to no difficulty and no headache   Time 8    Period Weeks   Status New   Target Date 04/15/17     PT LONG TERM GOAL #4   Title able to move head while working between 2 monitors with minimal to no difficulty due to improve cervical ROM   Time 8   Period Weeks   Status New   Target Date 04/15/17     PT LONG TERM GOAL #  5   Title FOTO score </= 39% limitation   Time 8   Period Weeks   Status New   Target Date 04/15/17               Plan - 03/08/17 1315    Clinical Impression Statement Patient had improved cervical rotation by 25% both ways after treatment. Patient has tightness in pectoralis muscles and upper thoracic muscles.  Patient does better with repetative motion  Patient reports her pain is decreased by 30%.  Patient pain is more of a tightness.  Patient will benefit from skilled therapy to address limitation in ROM, strength and neuromuscular control.    Rehab Potential Excellent   Clinical Impairments Affecting Rehab Potential hypothyroidism; cardiac history   PT Frequency 2x / week   PT Duration 8 weeks   PT Treatment/Interventions Cryotherapy;Electrical Stimulation;Moist Heat;Traction;Ultrasound;Therapeutic activities;Therapeutic exercise;Neuromuscular re-education;Patient/family education;Passive range of motion;Manual techniques;Dry needling   PT Next Visit Plan conitnue with soft tissue work and joint mobilization of cervical spine to improve rotation; posture therex; cervical ROM exercises   PT Home Exercise Plan progress as needed   Recommended Other Services MD signed initial note   Consulted and Agree with Plan of Care Patient      Patient will benefit from skilled therapeutic intervention in order to improve the following deficits and impairments:  Decreased range of motion, Increased fascial restricitons, Pain, Decreased activity tolerance, Decreased mobility  Visit Diagnosis: Cervicalgia  Cramp and spasm     Problem List Patient Active Problem List   Diagnosis Date Noted  . CAD in native  artery 12/04/2015  . Cardiomyopathy, ischemic 12/04/2015  . CAD (coronary artery disease), native coronary artery 12/04/2015  . History of pulmonary embolism 11/25/2015  . Lactic acidosis 11/25/2015  . AKI (acute kidney injury) ([place]) 11/25/2015  . Aspiration pneumonia ([place]) 11/25/2015  . Hyperglycemia 11/25/2015  . Normocytic anemia 11/25/2015  . Acute respiratory failure with hypoxia ([place])   . Cardiogenic shock ([place])   . NSTEMI (non-ST elevated myocardial infarction) ([place])   . Cardiac arrest ([place]) 11/19/2015  . Angina pectoris ([place]) 08/28/2015  . Retinal detachment 05/07/2014  . OA (osteoarthritis) of knee 01/08/2014  . Preoperative respiratory examination 11/22/2013  . Pulmonary embolism ([place]) 04/18/2013  . Chronic back pain 04/18/2013  . Dyspnea on exertion 04/06/2013  . 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 Hypothyroidism   . Hypercholesterolemia     Earlie Counts, PT 03/08/17 1:19 PM   [facility] [address] [place], [place], [address] Phone: [phone]   Fax:  [phone]  Name: Brittany Mason MRN: [id number] Date of Birth: June 17, 1950

## 2017-03-11 ENCOUNTER — Ambulatory Visit: Payer: 59 | Admitting: Physical Therapy

## 2017-03-11 ENCOUNTER — Encounter: Payer: Self-pay | Admitting: Physical Therapy

## 2017-03-11 DIAGNOSIS — M542 Cervicalgia: Secondary | ICD-10-CM | POA: Diagnosis not present

## 2017-03-11 DIAGNOSIS — R252 Cramp and spasm: Secondary | ICD-10-CM | POA: Diagnosis not present

## 2017-03-11 NOTE — Therapy (Signed)
[facility] [address] [place], [place], [address] Phone: [phone]   Fax:  [phone]  Physical Therapy Treatment  Patient Details  Name: Brittany Mason MRN: [id number] Date of Birth: 09/13/1950 Referring Provider: Dr. Latanya Maudlin  Encounter Date: 03/11/2017      PT End of Session - 03/11/17 0853    Visit Number 7   Date for PT Re-Evaluation 04/15/17   Authorization Type UMR   PT Start Time 0800   PT Stop Time 0847   PT Time Calculation (min) 47 min   Activity Tolerance Patient tolerated treatment well   Behavior During Therapy [facility] for tasks assessed/performed      Past Medical History:  Diagnosis Date  . Acute pulmonary embolism ([place]) 06/20/2015  . Arthritis    osteoarthritis-knees. Chronic back pain  . Basal cell carcinoma of right shoulder    "burned off"  . Chronic SI joint pain   . DVT (deep venous thrombosis) ([place]) 04/2013   left lower leg, resulted in Pulmonary emboli on hormone therapy  . Dyspnea on exertion   . GERD (gastroesophageal reflux disease)   . Hypercholesterolemia   . Hypothyroidism   . Migraine    "< once/month" (12/04/2015)  . MVP (mitral valve prolapse)    asymptomatic  . Myocardial infarction ([place]) 11/19/2015   cardiac arrest  . PE (pulmonary embolism) Apr 18, 2013   tx. -using Xarelto now, no further lung problems, denies SOB on 01-02-14  . PONV (postoperative nausea and vomiting)   . RLS (restless legs syndrome)     Past Surgical History:  Procedure Laterality Date  . ANTERIOR CRUCIATE LIGAMENT REPAIR Left 1976; 1981   "open"  . CARDIAC CATHETERIZATION N/A 11/20/2015   Procedure: Left Heart Cath and Coronary Angiography;  Surgeon: Belva Crome, MD;  Location: [place] [facility];  Service: Cardiovascular;  Laterality: N/A;  . CARDIAC CATHETERIZATION N/A 12/04/2015   Procedure: Left Heart Cath and Coronary Angiography;  Surgeon: Peter M Martinique, MD;  Location: [place] [facility];  Service: Cardiovascular;  Laterality: N/A;  . CARDIAC CATHETERIZATION  12/04/2015   Procedure: Coronary Stent Intervention;  Surgeon: Peter M Martinique, MD;  Location: [place] [facility];  Service: Cardiovascular;;  . COLONOSCOPY  09/28/2011   Procedure: COLONOSCOPY;  Surgeon: Juanita Craver, MD;  [facility];  Service: Endoscopy;  Laterality: N/A;  . COLONOSCOPY WITH PROPOFOL N/A 01/05/2017   Procedure: COLONOSCOPY WITH PROPOFOL;  Surgeon: Juanita Craver, MD;  [facility];  Service: Endoscopy;  Laterality: N/A;  . JOINT REPLACEMENT    . LEFT HEART CATH AND CORONARY ANGIOGRAPHY N/A 10/08/2016   Procedure: Left Heart Cath and Coronary Angiography;  Surgeon: Peter M Martinique, MD;  Location: [place] [facility];  Service: Cardiovascular;  Laterality: N/A;  . TOTAL ABDOMINAL HYSTERECTOMY  1998  . TOTAL KNEE ARTHROPLASTY Left 01/08/2014   Procedure: LEFT TOTAL KNEE ARTHROPLASTY;  Surgeon: Gearlean Alf, MD;  [facility];  Service: Orthopedics;  Laterality: Left;    There were no vitals filed for this visit.      Subjective Assessment - 03/11/17 0805    Subjective I was sore after therapy.  I feel I retained part of the motion I got from last visit.    Patient Stated Goals improve movement especially with driving and shopping; reduce pain   Currently in Pain? No/denies  [facility] Adult PT Treatment/Exercise - 03/11/17 0001      Neck Exercises: Machines for Strengthening   UBE (Upper Arm Bike) level 1 2 min forward/2 min. backward     Neck Exercises: Standing   Other Standing Exercises doorway stretch bil. hold 15 sec, 4x     Neck Exercises: Seated   Other Seated Exercise sitting holding green physioball and rotating 10x each way     Neck Exercises: Supine   Neck Retraction 5 reps;5 secs  head on red physioball   Capital Flexion 10 reps;3 secs   Cervical Rotation Both;20 reps  with assistance   Upper Extremity D1  Extension;Flexion;20 reps  head on physioball   Upper Extremity D2 Flexion;Extension;20 reps  head on physioball     Neck Exercises: Prone   Other Prone Exercise on physioball with lifting crook of elbow and turn head to increase rotation 10x bil.      Manual Therapy   Manual Therapy Joint mobilization;Soft tissue mobilization   Joint Mobilization sideglide and gapping of the left cervical facets C2-C5; sideglide of C2-C5 bil.    Soft tissue mobilization cervical paraspinals, suboccipitals, scalenes, around Left side of C2-C5   Passive ROM PROM to cervical for rotatioin                PT Education - 03/11/17 0852    Education provided Yes   Education Details very hour do cervical rotation with pillow case    Person(s) Educated Patient   Methods Explanation   Comprehension Verbalized understanding          PT Short Term Goals - 03/08/17 1238      PT SHORT TERM GOAL #1   Title indpendent with initial HEP   Time 4   Period Weeks   Status Achieved     PT SHORT TERM GOAL #2   Title ability to shop looking at the signs with moderate difficutly due to improved cervical motion   Time 4   Period Weeks   Status Achieved     PT SHORT TERM GOAL #3   Title ability to turn head with driving with pain decreased >/= 25% due to improved mobility   Baseline unable to do it   Period Weeks   Status On-going           PT Long Term Goals - 02/18/17 0918      PT LONG TERM GOAL #1   Title independent with HEP   Time 8   Period Weeks   Status New   [place] Date 04/15/17     PT LONG TERM GOAL #2   Title ability to turn her head without difficulty while driving due to improved  cervical ROM   Time 8   Period Weeks   Status New   [place] Date 04/15/17     PT LONG TERM GOAL #3   Title abilty to shop in grocery store with looking at the signs with minimal to no difficulty and no headache   Time 8   Period Weeks   Status New   [place] Date 04/15/17     PT LONG TERM  GOAL #4   Title able to move head while working between 2 monitors with minimal to no difficulty due to improve cervical ROM   Time 8   Period Weeks   Status New   [place] Date 04/15/17     PT LONG TERM GOAL #5   Title FOTO score </= 39% limitation  Time 8   Period Weeks   Status New   Target Date 04/15/17               Plan - 03/11/17 0806    Clinical Impression Statement After therapy left cervical rotation was limited by 25% compared to 50%. Patient has tightness located on left side of C3-C5.  Patient still has headaches on occasion.  Patient will benefit from skilled therapy to address limitation in ROM, strength and neuromuscular control.    Rehab Potential Excellent   Clinical Impairments Affecting Rehab Potential hypothyroidism; cardiac history   PT Frequency 2x / week   PT Duration 8 weeks   PT Treatment/Interventions Cryotherapy;Electrical Stimulation;Moist Heat;Traction;Ultrasound;Therapeutic activities;Therapeutic exercise;Neuromuscular re-education;Patient/family education;Passive range of motion;Manual techniques;Dry needling   PT Next Visit Plan conitnue with soft tissue work and joint mobilization of cervical spine to improve rotation; posture therex; cervical ROM exercises   PT Home Exercise Plan progress as needed   Consulted and Agree with Plan of Care Patient      Patient will benefit from skilled therapeutic intervention in order to improve the following deficits and impairments:  Decreased range of motion, Increased fascial restricitons, Pain, Decreased activity tolerance, Decreased mobility  Visit Diagnosis: Cervicalgia  Cramp and spasm     Problem List Patient Active Problem List   Diagnosis Date Noted  . CAD in native artery 12/04/2015  . Cardiomyopathy, ischemic 12/04/2015  . CAD (coronary artery disease), native coronary artery 12/04/2015  . History of pulmonary embolism 11/25/2015  . Lactic acidosis 11/25/2015  . AKI (acute kidney  injury) ([place]) 11/25/2015  . Aspiration pneumonia ([place]) 11/25/2015  . Hyperglycemia 11/25/2015  . Normocytic anemia 11/25/2015  . Acute respiratory failure with hypoxia ([place])   . Cardiogenic shock ([place])   . NSTEMI (non-ST elevated myocardial infarction) ([place])   . Cardiac arrest ([place]) 11/19/2015  . Angina pectoris ([place]) 08/28/2015  . Retinal detachment 05/07/2014  . OA (osteoarthritis) of knee 01/08/2014  . Preoperative respiratory examination 11/22/2013  . Pulmonary embolism ([place]) 04/18/2013  . Chronic back pain 04/18/2013  . Dyspnea on exertion 04/06/2013  . 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 Hypothyroidism   . Hypercholesterolemia     Earlie Counts, PT 03/11/17 8:56 AM   [facility] [address], [place], [place], [address] Phone: [phone]   Fax:  [phone]  Name: Brittany Mason MRN: [id number] Date of Birth: 1950-10-06

## 2017-03-15 ENCOUNTER — Ambulatory Visit: Payer: 59 | Admitting: Physical Therapy

## 2017-03-15 ENCOUNTER — Encounter: Payer: Self-pay | Admitting: Physical Therapy

## 2017-03-15 DIAGNOSIS — R252 Cramp and spasm: Secondary | ICD-10-CM | POA: Diagnosis not present

## 2017-03-15 DIAGNOSIS — M542 Cervicalgia: Secondary | ICD-10-CM | POA: Diagnosis not present

## 2017-03-15 NOTE — Therapy (Signed)
[facility] [address] [place], [place], [address] Phone: [phone]   Fax:  [phone]  Physical Therapy Treatment  Patient Details  Name: Brittany Mason MRN: [id number] Date of Birth: 02-Nov-1950 Referring Provider: Dr. Latanya Maudlin  Encounter Date: 03/15/2017      Brittany Mason End of Session - 03/15/17 0841    Visit Number 8   Date for Brittany Mason Re-Evaluation 04/15/17   Authorization Type UMR   Brittany Mason Start Time 0800   Brittany Mason Stop Time 0841   Brittany Mason Time Calculation (min) 41 min   Activity Tolerance Patient tolerated treatment well   Behavior During Therapy [facility] for tasks assessed/performed      Past Medical History:  Diagnosis Date  . Acute pulmonary embolism ([place]) 06/20/2015  . Arthritis    osteoarthritis-knees. Chronic back pain  . Basal cell carcinoma of right shoulder    "burned off"  . Chronic SI joint pain   . DVT (deep venous thrombosis) ([place]) 04/2013   left lower leg, resulted in Pulmonary emboli on hormone therapy  . Dyspnea on exertion   . GERD (gastroesophageal reflux disease)   . Hypercholesterolemia   . Hypothyroidism   . Migraine    "< once/month" (12/04/2015)  . MVP (mitral valve prolapse)    asymptomatic  . Myocardial infarction ([place]) 11/19/2015   cardiac arrest  . PE (pulmonary embolism) Apr 18, 2013   tx. -using Xarelto now, no further lung problems, denies SOB on 01-02-14  . PONV (postoperative nausea and vomiting)   . RLS (restless legs syndrome)     Past Surgical History:  Procedure Laterality Date  . ANTERIOR CRUCIATE LIGAMENT REPAIR Left 1976; 1981   "open"  . CARDIAC CATHETERIZATION N/A 11/20/2015   Procedure: Left Heart Cath and Coronary Angiography;  Surgeon: Belva Crome, MD;  Location: [place] [facility];  Service: Cardiovascular;  Laterality: N/A;  . CARDIAC CATHETERIZATION N/A 12/04/2015   Procedure: Left Heart Cath and Coronary Angiography;  Surgeon: Peter M Martinique, MD;  Location: [place] [facility];  Service: Cardiovascular;  Laterality: N/A;  . CARDIAC CATHETERIZATION  12/04/2015   Procedure: Coronary Stent Intervention;  Surgeon: Peter M Martinique, MD;  Location: [place] [facility];  Service: Cardiovascular;;  . COLONOSCOPY  09/28/2011   Procedure: COLONOSCOPY;  Surgeon: Juanita Craver, MD;  [facility];  Service: Endoscopy;  Laterality: N/A;  . COLONOSCOPY WITH PROPOFOL N/A 01/05/2017   Procedure: COLONOSCOPY WITH PROPOFOL;  Surgeon: Juanita Craver, MD;  [facility]: [facility];  Service: Endoscopy;  Laterality: N/A;  . JOINT REPLACEMENT    . LEFT HEART CATH AND CORONARY ANGIOGRAPHY N/A 10/08/2016   Procedure: Left Heart Cath and Coronary Angiography;  Surgeon: Peter M Martinique, MD;  Location: [place] [facility];  Service: Cardiovascular;  Laterality: N/A;  . TOTAL ABDOMINAL HYSTERECTOMY  1998  . TOTAL KNEE ARTHROPLASTY Left 01/08/2014   Procedure: LEFT TOTAL KNEE ARTHROPLASTY;  Surgeon: Gearlean Alf, MD;  Location: [facility];  Service: Orthopedics;  Laterality: Left;    There were no vitals filed for this visit.      Subjective Assessment - 03/15/17 0806    Subjective The increased ROM lasted.  I was a little sore after therapy.  Patient reports it is easier for her to turn to the right. I only get a headache the day after therapy.    Patient Stated Goals improve movement especially with driving and shopping; reduce pain   Currently in Pain? Yes  Pain Score 2    Pain Location Neck   Pain Orientation Right   Pain Descriptors / Indicators Aching;Dull   Pain Type Chronic pain   Pain Onset More than a month ago   Pain Frequency Intermittent   Aggravating Factors  turning head alot   Pain Relieving Factors ice   Multiple Pain Sites No                         [facility] Adult Brittany Mason Treatment/Exercise - 03/15/17 0001      Neck Exercises: Machines for Strengthening   UBE (Upper Arm Bike) level 1 2 min forward/2 min. backward     Neck Exercises: Standing    Other Standing Exercises doorway stretch bil. hold 15 sec, 4x     Neck Exercises: Seated   Other Seated Exercise sitting holding green physioball and rotating 10x each way; bring ball overhead increasing trunk extension     Neck Exercises: Supine   Neck Retraction 5 reps;5 secs  head on red physioball   Capital Flexion 10 reps;3 secs   Cervical Rotation Both;20 reps  with assistance   Upper Extremity D2 Flexion;Extension;20 reps  head on physioball     Neck Exercises: Prone   Other Prone Exercise on physioball with lifting crook of elbow and turn head to increase rotation 10x bil.      Manual Therapy   Manual Therapy Joint mobilization;Soft tissue mobilization   Joint Mobilization sideglide and gapping of the left cervical facets C2-C5; sideglide of C2-C5 bil.    Soft tissue mobilization cervical paraspinals, suboccipitals, scalenes, around Left side of C2-C5   Passive ROM PROM to cervical for rotatioin                  Brittany Mason Short Term Goals - 03/15/17 0809      Brittany Mason SHORT TERM GOAL #3   Title ability to turn head with driving with pain decreased >/= 25% due to improved mobility   Time 4   Period Weeks   Status Achieved           Brittany Mason Long Term Goals - 03/15/17 0810      Brittany Mason LONG TERM GOAL #1   Title independent with HEP   Baseline still learning   Time 8   Period Weeks   Status On-going     Brittany Mason LONG TERM GOAL #2   Title ability to turn her head without difficulty while driving due to improved  cervical ROM   Baseline does not have full motion yet   Time 8   Period Weeks   Status On-going     Brittany Mason LONG TERM GOAL #3   Title abilty to shop in grocery store with looking at the signs with minimal to no difficulty and no headache   Baseline has not been shopping yet   Time 8   Period Weeks   Status On-going     Brittany Mason LONG TERM GOAL #4   Title able to move head while working between 2 monitors with minimal to no difficulty due to improve cervical ROM   Baseline  50% better   Time 8   Period Weeks   Status On-going     Brittany Mason LONG TERM GOAL #5   Title FOTO score </= 39% limitation   Time 8   Period Weeks   Status New               Plan - 03/15/17 1517  Clinical Impression Statement After therapy pain decreased to 1/10, full right rotation and left is limited by 25%.  Patient had increased tightness located in left side ot C7-T3. Patient reports 50% less pain with turning her head side to side while looking at monitors.  Patient will benefit from skilled therapy to address limitation in ROM, strength and neuromusclular control.    Rehab Potential Excellent   Clinical Impairments Affecting Rehab Potential hypothyroidism; cardiac history   Brittany Mason Frequency 2x / week   Brittany Mason Duration 8 weeks   Brittany Mason Treatment/Interventions Cryotherapy;Electrical Stimulation;Moist Heat;Traction;Ultrasound;Therapeutic activities;Therapeutic exercise;Neuromuscular re-education;Patient/family education;Passive range of motion;Manual techniques;Dry needling   Brittany Mason Next Visit Plan conitnue with soft tissue work and joint mobilization of cervical spine to improve rotation; posture therex; cervical ROM exercises   Brittany Mason Home Exercise Plan progress as needed   Consulted and Agree with Plan of Care Patient      Patient will benefit from skilled therapeutic intervention in order to improve the following deficits and impairments:  Decreased range of motion, Increased fascial restricitons, Pain, Decreased activity tolerance, Decreased mobility  Visit Diagnosis: Cervicalgia  Cramp and spasm     Problem List Patient Active Problem List   Diagnosis Date Noted  . CAD in native artery 12/04/2015  . Cardiomyopathy, ischemic 12/04/2015  . CAD (coronary artery disease), native coronary artery 12/04/2015  . History of pulmonary embolism 11/25/2015  . Lactic acidosis 11/25/2015  . AKI (acute kidney injury) ([place]) 11/25/2015  . Aspiration pneumonia ([place]) 11/25/2015  . Hyperglycemia  11/25/2015  . Normocytic anemia 11/25/2015  . Acute respiratory failure with hypoxia ([place])   . Cardiogenic shock ([place])   . NSTEMI (non-ST elevated myocardial infarction) ([place])   . Cardiac arrest ([place]) 11/19/2015  . Angina pectoris ([place]) 08/28/2015  . Retinal detachment 05/07/2014  . OA (osteoarthritis) of knee 01/08/2014  . Preoperative respiratory examination 11/22/2013  . Pulmonary embolism ([place]) 04/18/2013  . Chronic back pain 04/18/2013  . Dyspnea on exertion 04/06/2013  . 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 Hypothyroidism   . Hypercholesterolemia     Brittany Mason, Brittany Mason 03/15/17 8:44 AM   [facility] [address] [place], [place], [address] Phone: [phone]   Fax:  [phone]  Name: Brittany Mason MRN: [id number] Date of Birth: 13-Jul-1950

## 2017-03-18 ENCOUNTER — Ambulatory Visit: Payer: 59 | Admitting: Physical Therapy

## 2017-03-18 ENCOUNTER — Encounter: Payer: Self-pay | Admitting: Physical Therapy

## 2017-03-18 DIAGNOSIS — R252 Cramp and spasm: Secondary | ICD-10-CM | POA: Diagnosis not present

## 2017-03-18 DIAGNOSIS — M542 Cervicalgia: Secondary | ICD-10-CM

## 2017-03-18 NOTE — Therapy (Signed)
[facility] [address], [place], [place], [address] Phone: [phone]   Fax:  [phone]  Physical Therapy Treatment  Patient Details  Name: Brittany Mason MRN: [id number] Date of Birth: Apr 08, 1951 Referring Provider: Dr. Latanya Maudlin  Encounter Date: 03/18/2017      PT End of Session - 03/18/17 0840    Visit Number 9   Date for PT Re-Evaluation 04/15/17   Authorization Type UMR   PT Start Time 0805   PT Stop Time 0843   PT Time Calculation (min) 38 min   Activity Tolerance Patient tolerated treatment well   Behavior During Therapy [facility] for tasks assessed/performed      Past Medical History:  Diagnosis Date  . Acute pulmonary embolism ([place]) 06/20/2015  . Arthritis    osteoarthritis-knees. Chronic back pain  . Basal cell carcinoma of right shoulder    "burned off"  . Chronic SI joint pain   . DVT (deep venous thrombosis) ([place]) 04/2013   left lower leg, resulted in Pulmonary emboli on hormone therapy  . Dyspnea on exertion   . GERD (gastroesophageal reflux disease)   . Hypercholesterolemia   . Hypothyroidism   . Migraine    "< once/month" (12/04/2015)  . MVP (mitral valve prolapse)    asymptomatic  . Myocardial infarction ([place]) 11/19/2015   cardiac arrest  . PE (pulmonary embolism) Apr 18, 2013   tx. -using Xarelto now, no further lung problems, denies SOB on 01-02-14  . PONV (postoperative nausea and vomiting)   . RLS (restless legs syndrome)     Past Surgical History:  Procedure Laterality Date  . ANTERIOR CRUCIATE LIGAMENT REPAIR Left 1976; 1981   "open"  . CARDIAC CATHETERIZATION N/A 11/20/2015   Procedure: Left Heart Cath and Coronary Angiography;  Surgeon: Belva Crome, MD;  Location: [place] [facility];  Service: Cardiovascular;  Laterality: N/A;  . CARDIAC CATHETERIZATION N/A 12/04/2015   Procedure: Left Heart Cath and Coronary Angiography;  Surgeon: Peter M Martinique, MD;  Location: [place] [facility];  Service: Cardiovascular;  Laterality: N/A;  . CARDIAC CATHETERIZATION  12/04/2015   Procedure: Coronary Stent Intervention;  Surgeon: Peter M Martinique, MD;  Location: [place] [facility];  Service: Cardiovascular;;  . COLONOSCOPY  09/28/2011   Procedure: COLONOSCOPY;  Surgeon: Juanita Craver, MD;  [facility];  Service: Endoscopy;  Laterality: N/A;  . COLONOSCOPY WITH PROPOFOL N/A 01/05/2017   Procedure: COLONOSCOPY WITH PROPOFOL;  Surgeon: Juanita Craver, MD;  [facility];  Service: Endoscopy;  Laterality: N/A;  . JOINT REPLACEMENT    . LEFT HEART CATH AND CORONARY ANGIOGRAPHY N/A 10/08/2016   Procedure: Left Heart Cath and Coronary Angiography;  Surgeon: Peter M Martinique, MD;  Location: [place] [facility];  Service: Cardiovascular;  Laterality: N/A;  . TOTAL ABDOMINAL HYSTERECTOMY  1998  . TOTAL KNEE ARTHROPLASTY Left 01/08/2014   Procedure: LEFT TOTAL KNEE ARTHROPLASTY;  Surgeon: Gearlean Alf, MD;  [facility];  Service: Orthopedics;  Laterality: Left;    There were no vitals filed for this visit.      Subjective Assessment - 03/18/17 0808    Subjective I feel like I have more motion especially to the right. I feel like when I see to the left there is a muscle on the right suboccipital blocking me.    Patient Stated Goals improve movement especially with driving and shopping; reduce pain   Currently in Pain? Yes   Pain Score 1  Pain Location Neck   Pain Orientation Right   Pain Descriptors / Indicators Aching;Dull   Pain Type Chronic pain   Pain Onset More than a month ago   Pain Frequency Intermittent   Aggravating Factors  turning head alot   Pain Relieving Factors ice   Multiple Pain Sites No            [facility] PT Assessment - 03/18/17 0001      AROM   Cervical - Right Rotation full                     [facility] Adult PT Treatment/Exercise - 03/18/17 0001      Neck Exercises: Machines for Strengthening   UBE (Upper Arm  Bike) level 1 2 min forward/2 min. backward     Neck Exercises: Seated   Other Seated Exercise sitting holding green physioball and rotating 10x each way; bring ball overhead increasing trunk extension     Neck Exercises: Supine   Neck Retraction 5 reps;5 secs  head on red physioball   Capital Flexion 10 reps;3 secs   Cervical Rotation Both;20 reps  with assistance     Manual Therapy   Manual Therapy Joint mobilization;Soft tissue mobilization   Manual therapy comments contract relax for bil. rotation 10x each way   Joint Mobilization sideglide and gapping of the left cervical facets C2-C5; sideglide of C2-C5 bil.    Soft tissue mobilization cervical paraspinals, suboccipitals, scalenes, around Left side of C2-C5   Passive ROM PROM to cervical for rotatioin                  PT Short Term Goals - 03/15/17 0809      PT SHORT TERM GOAL #3   Title ability to turn head with driving with pain decreased >/= 25% due to improved mobility   Time 4   Period Weeks   Status Achieved           PT Long Term Goals - 03/15/17 0810      PT LONG TERM GOAL #1   Title independent with HEP   Baseline still learning   Time 8   Period Weeks   Status On-going     PT LONG TERM GOAL #2   Title ability to turn her head without difficulty while driving due to improved  cervical ROM   Baseline does not have full motion yet   Time 8   Period Weeks   Status On-going     PT LONG TERM GOAL #3   Title abilty to shop in grocery store with looking at the signs with minimal to no difficulty and no headache   Baseline has not been shopping yet   Time 8   Period Weeks   Status On-going     PT LONG TERM GOAL #4   Title able to move head while working between 2 monitors with minimal to no difficulty due to improve cervical ROM   Baseline 50% better   Time 8   Period Weeks   Status On-going     PT LONG TERM GOAL #5   Title FOTO score </= 39% limitation   Time 8   Period Weeks    Status New               Plan - 03/18/17 8416    Clinical Impression Statement Patient now has full cervical right rotation and left is limited by 25%.  Patient has tightness at C1-C2  on right.  Patient will benefit from skilled therapy to address limitation iin ROM, strength and nurromuscular control.    Rehab Potential Excellent   Clinical Impairments Affecting Rehab Potential hypothyroidism; cardiac history   PT Frequency 2x / week   PT Duration 8 weeks   PT Treatment/Interventions Cryotherapy;Electrical Stimulation;Moist Heat;Traction;Ultrasound;Therapeutic activities;Therapeutic exercise;Neuromuscular re-education;Patient/family education;Passive range of motion;Manual techniques;Dry needling   PT Next Visit Plan conitnue with soft tissue work and joint mobilization of cervical spine to improve rotation; posture therex; cervical ROM exercises   PT Home Exercise Plan progress as needed   Consulted and Agree with Plan of Care Patient      Patient will benefit from skilled therapeutic intervention in order to improve the following deficits and impairments:  Decreased range of motion, Increased fascial restricitons, Pain, Decreased activity tolerance, Decreased mobility  Visit Diagnosis: Cervicalgia  Cramp and spasm     Problem List Patient Active Problem List   Diagnosis Date Noted  . CAD in native artery 12/04/2015  . Cardiomyopathy, ischemic 12/04/2015  . CAD (coronary artery disease), native coronary artery 12/04/2015  . History of pulmonary embolism 11/25/2015  . Lactic acidosis 11/25/2015  . AKI (acute kidney injury) ([place]) 11/25/2015  . Aspiration pneumonia ([place]) 11/25/2015  . Hyperglycemia 11/25/2015  . Normocytic anemia 11/25/2015  . Acute respiratory failure with hypoxia ([place])   . Cardiogenic shock ([place])   . NSTEMI (non-ST elevated myocardial infarction) ([place])   . Cardiac arrest ([place]) 11/19/2015  . Angina pectoris ([place]) 08/28/2015  . Retinal detachment  05/07/2014  . OA (osteoarthritis) of knee 01/08/2014  . Preoperative respiratory examination 11/22/2013  . Pulmonary embolism ([place]) 04/18/2013  . Chronic back pain 04/18/2013  . Dyspnea on exertion 04/06/2013  . 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 Hypothyroidism   . Hypercholesterolemia     Earlie Counts, PT 03/18/17 8:44 AM   [facility] [address] [place], [place], [address] Phone: [phone]   Fax:  [phone]  Name: Brittany Mason MRN: [id number] Date of Birth: Aug 04, 1950

## 2017-03-22 ENCOUNTER — Ambulatory Visit: Payer: 59 | Admitting: Physical Therapy

## 2017-03-22 DIAGNOSIS — M542 Cervicalgia: Secondary | ICD-10-CM | POA: Diagnosis not present

## 2017-03-22 DIAGNOSIS — R252 Cramp and spasm: Secondary | ICD-10-CM

## 2017-03-22 NOTE — Therapy (Signed)
[facility] [address] [place], [place], [address] Phone: [phone]   Fax:  [phone]  Physical Therapy Treatment  Patient Details  Name: Brittany Mason MRN: [id number] Date of Birth: 1950/12/23 Referring Provider: Dr. Latanya Maudlin  Encounter Date: 03/22/2017      PT End of Session - 03/22/17 1658    Visit Number 10   Date for PT Re-Evaluation 04/15/17   Authorization Type UMR   PT Start Time 1615   PT Stop Time 1655   PT Time Calculation (min) 40 min   Activity Tolerance Patient tolerated treatment well   Behavior During Therapy [facility] for tasks assessed/performed      Past Medical History:  Diagnosis Date  . Acute pulmonary embolism ([place]) 06/20/2015  . Arthritis    osteoarthritis-knees. Chronic back pain  . Basal cell carcinoma of right shoulder    "burned off"  . Chronic SI joint pain   . DVT (deep venous thrombosis) ([place]) 04/2013   left lower leg, resulted in Pulmonary emboli on hormone therapy  . Dyspnea on exertion   . GERD (gastroesophageal reflux disease)   . Hypercholesterolemia   . Hypothyroidism   . Migraine    "< once/month" (12/04/2015)  . MVP (mitral valve prolapse)    asymptomatic  . Myocardial infarction ([place]) 11/19/2015   cardiac arrest  . PE (pulmonary embolism) Apr 18, 2013   tx. -using Xarelto now, no further lung problems, denies SOB on 01-02-14  . PONV (postoperative nausea and vomiting)   . RLS (restless legs syndrome)     Past Surgical History:  Procedure Laterality Date  . ANTERIOR CRUCIATE LIGAMENT REPAIR Left 1976; 1981   "open"  . CARDIAC CATHETERIZATION N/A 11/20/2015   Procedure: Left Heart Cath and Coronary Angiography;  Surgeon: Belva Crome, MD;  Location: [place] [facility];  Service: Cardiovascular;  Laterality: N/A;  . CARDIAC CATHETERIZATION N/A 12/04/2015   Procedure: Left Heart Cath and Coronary Angiography;  Surgeon: Peter M Martinique, MD;  Location: [place] [facility];  Service: Cardiovascular;  Laterality: N/A;  . CARDIAC CATHETERIZATION  12/04/2015   Procedure: Coronary Stent Intervention;  Surgeon: Peter M Martinique, MD;  Location: [place] [facility];  Service: Cardiovascular;;  . COLONOSCOPY  09/28/2011   Procedure: COLONOSCOPY;  Surgeon: Juanita Craver, MD;  [facility];  Service: Endoscopy;  Laterality: N/A;  . COLONOSCOPY WITH PROPOFOL N/A 01/05/2017   Procedure: COLONOSCOPY WITH PROPOFOL;  Surgeon: Juanita Craver, MD;  [facility];  Service: Endoscopy;  Laterality: N/A;  . JOINT REPLACEMENT    . LEFT HEART CATH AND CORONARY ANGIOGRAPHY N/A 10/08/2016   Procedure: Left Heart Cath and Coronary Angiography;  Surgeon: Peter M Martinique, MD;  Location: [place] [facility];  Service: Cardiovascular;  Laterality: N/A;  . TOTAL ABDOMINAL HYSTERECTOMY  1998  . TOTAL KNEE ARTHROPLASTY Left 01/08/2014   Procedure: LEFT TOTAL KNEE ARTHROPLASTY;  Surgeon: Gearlean Alf, MD;  [facility];  Service: Orthopedics;  Laterality: Left;    There were no vitals filed for this visit.      Subjective Assessment - 03/22/17 1620    Subjective I see the doctor next week.    Patient Stated Goals improve movement especially with driving and shopping; reduce pain   Currently in Pain? Yes   Pain Score 4    Pain Location Head   Pain Orientation --  right frontal lobe   Pain Descriptors / Indicators Tender  Pain Type Acute pain   Pain Radiating Towards none   Pain Onset More than a month ago   Pain Frequency Intermittent   Aggravating Factors  headache   Pain Relieving Factors medication   Multiple Pain Sites No            [facility] PT Assessment - 03/22/17 0001      AROM   Cervical - Right Rotation full   Cervical - Left Rotation 40 deg  end of treatment is 75 degrees                     [facility] Adult PT Treatment/Exercise - 03/22/17 0001      Neck Exercises: Machines for Strengthening   UBE (Upper Arm Bike) level 1 2  min forward/2 min. backward     Neck Exercises: Standing   Other Standing Exercises doorway stretch bil. hold 15 sec, 4x     Neck Exercises: Seated   Other Seated Exercise sitting holding green physioball and rotating 10x each way; bring ball overhead increasing trunk extension; diagonals 10x each way     Manual Therapy   Manual Therapy Joint mobilization;Soft tissue mobilization   Manual therapy comments contract relax for bil. rotation 10x each way   Joint Mobilization sideglide and gapping of the left cervical facets C2-C5; sideglide of C2-C5 bil.    Soft tissue mobilization cervical paraspinals, suboccipitals, scalenes, around Left side of C2-C5   Passive ROM PROM to cervical for rotatioin                  PT Short Term Goals - 03/15/17 0809      PT SHORT TERM GOAL #3   Title ability to turn head with driving with pain decreased >/= 25% due to improved mobility   Time 4   Period Weeks   Status Achieved           PT Long Term Goals - 03/15/17 0810      PT LONG TERM GOAL #1   Title independent with HEP   Baseline still learning   Time 8   Period Weeks   Status On-going     PT LONG TERM GOAL #2   Title ability to turn her head without difficulty while driving due to improved  cervical ROM   Baseline does not have full motion yet   Time 8   Period Weeks   Status On-going     PT LONG TERM GOAL #3   Title abilty to shop in grocery store with looking at the signs with minimal to no difficulty and no headache   Baseline has not been shopping yet   Time 8   Period Weeks   Status On-going     PT LONG TERM GOAL #4   Title able to move head while working between 2 monitors with minimal to no difficulty due to improve cervical ROM   Baseline 50% better   Time 8   Period Weeks   Status On-going     PT LONG TERM GOAL #5   Title FOTO score </= 39% limitation   Time 8   Period Weeks   Status New               Plan - 03/22/17 1622    Clinical  Impression Statement Patient went from 40 degrees to 75degrees of left cervical rotation after manual therapy. Patient had no headache after therapy.  Patient will benfit from skilled therapy to address  limitation in ROM, strength and neuromuscular control.    Rehab Potential Excellent   Clinical Impairments Affecting Rehab Potential hypothyroidism; cardiac history   PT Frequency 2x / week   PT Duration 8 weeks   PT Treatment/Interventions Cryotherapy;Electrical Stimulation;Moist Heat;Traction;Ultrasound;Therapeutic activities;Therapeutic exercise;Neuromuscular re-education;Patient/family education;Passive range of motion;Manual techniques;Dry needling   PT Next Visit Plan Write MD progress note due to seeing MD on Monday; conitnue with soft tissue work and joint mobilization of cervical spine to improve rotation   PT Home Exercise Plan progress as needed   Consulted and Agree with Plan of Care Patient      Patient will benefit from skilled therapeutic intervention in order to improve the following deficits and impairments:  Decreased range of motion, Increased fascial restricitons, Pain, Decreased activity tolerance, Decreased mobility  Visit Diagnosis: Cervicalgia  Cramp and spasm     Problem List Patient Active Problem List   Diagnosis Date Noted  . CAD in native artery 12/04/2015  . Cardiomyopathy, ischemic 12/04/2015  . CAD (coronary artery disease), native coronary artery 12/04/2015  . History of pulmonary embolism 11/25/2015  . Lactic acidosis 11/25/2015  . AKI (acute kidney injury) ([place]) 11/25/2015  . Aspiration pneumonia ([place]) 11/25/2015  . Hyperglycemia 11/25/2015  . Normocytic anemia 11/25/2015  . Acute respiratory failure with hypoxia ([place])   . Cardiogenic shock ([place])   . NSTEMI (non-ST elevated myocardial infarction) ([place])   . Cardiac arrest ([place]) 11/19/2015  . Angina pectoris ([place]) 08/28/2015  . Retinal detachment 05/07/2014  . OA (osteoarthritis) of knee  01/08/2014  . Preoperative respiratory examination 11/22/2013  . Pulmonary embolism ([place]) 04/18/2013  . Chronic back pain 04/18/2013  . Dyspnea on exertion 04/06/2013  . 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 Hypothyroidism   . Hypercholesterolemia     Earlie Counts, PT 03/22/17 5:01 PM   [facility] [address] [place], [place], [address] Phone: [phone]   Fax:  [phone]  Name: Brittany Mason MRN: [id number] Date of Birth: 12/31/1950

## 2017-03-25 ENCOUNTER — Encounter: Payer: Self-pay | Admitting: Physical Therapy

## 2017-03-25 ENCOUNTER — Ambulatory Visit: Payer: 59 | Admitting: Physical Therapy

## 2017-03-25 DIAGNOSIS — M542 Cervicalgia: Secondary | ICD-10-CM

## 2017-03-25 DIAGNOSIS — R252 Cramp and spasm: Secondary | ICD-10-CM

## 2017-03-25 NOTE — Therapy (Signed)
[facility] [address] [place], [place], [address] Phone: [phone]   Fax:  [phone]  Physical Therapy Treatment  Patient Details  Name: Brittany Mason MRN: [id number] Date of Birth: [birth year] Referring Provider: Dr. Latanya Maudlin  Encounter Date: 03/25/2017      PT End of Session - 03/25/17 0843    Visit Number 11   Date for PT Re-Evaluation 04/15/17   Authorization Type UMR   PT Start Time 0808  came late   PT Stop Time 0846   PT Time Calculation (min) 38 min   Activity Tolerance Patient tolerated treatment well   Behavior During Therapy [facility] for tasks assessed/performed      Past Medical History:  Diagnosis Date  . Acute pulmonary embolism ([place]) 06/20/2015  . Arthritis    osteoarthritis-knees. Chronic back pain  . Basal cell carcinoma of right shoulder    "burned off"  . Chronic SI joint pain   . DVT (deep venous thrombosis) ([place]) 04/2013   left lower leg, resulted in Pulmonary emboli on hormone therapy  . Dyspnea on exertion   . GERD (gastroesophageal reflux disease)   . Hypercholesterolemia   . Hypothyroidism   . Migraine    "< once/month" (12/04/2015)  . MVP (mitral valve prolapse)    asymptomatic  . Myocardial infarction ([place]) 11/19/2015   cardiac arrest  . PE (pulmonary embolism) Apr 18, 2013   tx. -using Xarelto now, no further lung problems, denies SOB on 01-02-14  . PONV (postoperative nausea and vomiting)   . RLS (restless legs syndrome)     Past Surgical History:  Procedure Laterality Date  . ANTERIOR CRUCIATE LIGAMENT REPAIR Left 1976; 1981   "open"  . CARDIAC CATHETERIZATION N/A 11/20/2015   Procedure: Left Heart Cath and Coronary Angiography;  Surgeon: Belva Crome, MD;  Location: [place] [facility];  Service: Cardiovascular;  Laterality: N/A;  . CARDIAC CATHETERIZATION N/A 12/04/2015   Procedure: Left Heart Cath and Coronary Angiography;  Surgeon: Peter M Martinique, MD;   Location: [place] [facility];  Service: Cardiovascular;  Laterality: N/A;  . CARDIAC CATHETERIZATION  12/04/2015   Procedure: Coronary Stent Intervention;  Surgeon: Peter M Martinique, MD;  Location: [place] [facility];  Service: Cardiovascular;;  . COLONOSCOPY  09/28/2011   Procedure: COLONOSCOPY;  Surgeon: Juanita Craver, MD;  [facility];  Service: Endoscopy;  Laterality: N/A;  . COLONOSCOPY WITH PROPOFOL N/A 01/05/2017   Procedure: COLONOSCOPY WITH PROPOFOL;  Surgeon: Juanita Craver, MD;  [facility];  Service: Endoscopy;  Laterality: N/A;  . JOINT REPLACEMENT    . LEFT HEART CATH AND CORONARY ANGIOGRAPHY N/A 10/08/2016   Procedure: Left Heart Cath and Coronary Angiography;  Surgeon: Peter M Martinique, MD;  Location: [place] [facility];  Service: Cardiovascular;  Laterality: N/A;  . TOTAL ABDOMINAL HYSTERECTOMY  1998  . TOTAL KNEE ARTHROPLASTY Left 01/08/2014   Procedure: LEFT TOTAL KNEE ARTHROPLASTY;  Surgeon: Gearlean Alf, MD;  [facility];  Service: Orthopedics;  Laterality: Left;    There were no vitals filed for this visit.      Subjective Assessment - 03/25/17 0813    Subjective I was abl eto turn my head further to see when driving.    Patient Stated Goals improve movement especially with driving and shopping; reduce pain   Currently in Pain? No/denies            [facility] PT Assessment - 03/25/17 0001  Assessment   Medical Diagnosis M54.2 Cervical pain   Referring Provider Dr. Latanya Maudlin   Onset Date/Surgical Date 11/16/16   Prior Therapy None     Precautions   Precautions None     Restrictions   Weight Bearing Restrictions No     Balance Screen   Has the patient fallen in the past 6 months No   Has the patient had a decrease in activity level because of a fear of falling?  No   Is the patient reluctant to leave their home because of a fear of falling?  No     Home [employer] residence     Prior Function    Level of Independence Independent     Cognition   Overall Cognitive Status Within Functional Limits for tasks assessed     Observation/Other Assessments   Focus on Therapeutic Outcomes (FOTO)  58% limitation     Posture/Postural Control   Posture/Postural Control No significant limitations     AROM   Cervical Flexion 50   Cervical Extension 40   Cervical - Right Side Bend 30   Cervical - Left Side Bend 30   Cervical - Right Rotation 85   Cervical - Left Rotation 75  after therapy 85 degrees                     [facility] Adult PT Treatment/Exercise - 03/25/17 0001      Neck Exercises: Machines for Strengthening   UBE (Upper Arm Bike) level 1 2 min forward/2 min. backward     Neck Exercises: Standing   Other Standing Exercises doorway stretch bil. hold 15 sec, 4x     Neck Exercises: Seated   Other Seated Exercise sitting holding green physioball and rotating 10x each way; bring ball overhead increasing trunk extension; diagonals 10x each way  endrange assistance from therapist     Manual Therapy   Manual Therapy Joint mobilization;Soft tissue mobilization   Manual therapy comments contract relax for bil. rotation 10x each way   Joint Mobilization sideglide and gapping of the left cervical facets C2-C5; sideglide of C2-C5 bil. ; snags to right side of C2-4 in sitting, left side of T1-T3   Soft tissue mobilization cervical paraspinals, suboccipitals, scalenes, around Left side of C2-C5   Passive ROM PROM to cervical for rotatioin                  PT Short Term Goals - 03/15/17 0809      PT SHORT TERM GOAL #3   Title ability to turn head with driving with pain decreased >/= 25% due to improved mobility   Time 4   Period Weeks   Status Achieved           PT Long Term Goals - 03/25/17 6440      PT LONG TERM GOAL #1   Title independent with HEP   Baseline still learning   Time 8   Period Weeks   Status On-going     PT LONG TERM GOAL #2    Title ability to turn her head without difficulty while driving due to improved  cervical ROM   Baseline does not have full motion to left   Time 8   Period Weeks   Status On-going     PT LONG TERM GOAL #3   Title abilty to shop in grocery store with looking at the signs with minimal to no difficulty and no  headache   Baseline has not been shopping yet   Time 8   Period Weeks   Status On-going     PT LONG TERM GOAL #4   Title able to move head while working between 2 monitors with minimal to no difficulty due to improve cervical ROM   Baseline 50% better   Time 8   Period Weeks   Status On-going               Plan - 03/25/17 0814    Clinical Impression Statement Patient has has gone from 75 degrees to 85 degrees.  Patient has restrictions at T1-T3 and right side of C1-C3.  Patient has no headaches.  Patient is now able to turn her head to the left to ses around the corner for the first time.  Patient will benefit from skilled therapy to address limitation in ROM, Strength and neuromuscular control.    Rehab Potential Excellent   Clinical Impairments Affecting Rehab Potential hypothyroidism; cardiac history   PT Frequency 2x / week   PT Duration 8 weeks   PT Treatment/Interventions Cryotherapy;Electrical Stimulation;Moist Heat;Traction;Ultrasound;Therapeutic activities;Therapeutic exercise;Neuromuscular re-education;Patient/family education;Passive range of motion;Manual techniques;Dry needling   PT Next Visit Plan See how MD appointment went.  joint mobilization and STW to cervical to improve left rotation   PT Home Exercise Plan progress as needed   Recommended Other Services Sent MD progress note   Consulted and Agree with Plan of Care Patient      Patient will benefit from skilled therapeutic intervention in order to improve the following deficits and impairments:  Decreased range of motion, Increased fascial restricitons, Pain, Decreased activity tolerance, Decreased  mobility  Visit Diagnosis: Cervicalgia  Cramp and spasm     Problem List Patient Active Problem List   Diagnosis Date Noted  . CAD in native artery 12/04/2015  . Cardiomyopathy, ischemic 12/04/2015  . CAD (coronary artery disease), native coronary artery 12/04/2015  . History of pulmonary embolism 11/25/2015  . Lactic acidosis 11/25/2015  . AKI (acute kidney injury) ([place]) 11/25/2015  . Aspiration pneumonia ([place]) 11/25/2015  . Hyperglycemia 11/25/2015  . Normocytic anemia 11/25/2015  . Acute respiratory failure with hypoxia ([place])   . Cardiogenic shock ([place])   . NSTEMI (non-ST elevated myocardial infarction) ([place])   . Cardiac arrest ([place]) 11/19/2015  . Angina pectoris ([place]) 08/28/2015  . Retinal detachment 05/07/2014  . OA (osteoarthritis) of knee 01/08/2014  . Preoperative respiratory examination 11/22/2013  . Pulmonary embolism ([place]) 04/18/2013  . Chronic back pain 04/18/2013  . Dyspnea on exertion 04/06/2013  . 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 Hypothyroidism   . Hypercholesterolemia     Earlie Counts, PT 03/25/17 8:54 AM   [facility] [address] [place], [place], [address] Phone: [phone]   Fax:  [phone]  Name: Brittany Mason MRN: [id number] Date of Birth: April 28, 1951

## 2017-03-26 MED FILL — SYNTHROID 137 MCG TABLET: 137 | 30 days supply | Qty: 30 | Fill #1

## 2017-03-29 DIAGNOSIS — M542 Cervicalgia: Secondary | ICD-10-CM | POA: Diagnosis not present

## 2017-03-30 ENCOUNTER — Encounter: Payer: Self-pay | Admitting: Physical Therapy

## 2017-03-30 ENCOUNTER — Ambulatory Visit: Payer: 59 | Admitting: Physical Therapy

## 2017-03-30 DIAGNOSIS — M542 Cervicalgia: Secondary | ICD-10-CM

## 2017-03-30 DIAGNOSIS — R252 Cramp and spasm: Secondary | ICD-10-CM

## 2017-03-30 NOTE — Therapy (Signed)
[facility] [address] [place], [place], [address] Phone: [phone]   Fax:  [phone]  Physical Therapy Treatment  Patient Details  Name: Brittany Mason MRN: [id number] Date of Birth: [birth year] Referring Provider: Dr. Latanya Maudlin  Encounter Date: 03/30/2017      PT End of Session - 03/30/17 0808    Visit Number 12   Date for PT Re-Evaluation 04/15/17   Authorization Type UMR   PT Start Time 0805   PT Stop Time 0845   PT Time Calculation (min) 40 min   Activity Tolerance Patient tolerated treatment well   Behavior During Therapy [facility] for tasks assessed/performed      Past Medical History:  Diagnosis Date  . Acute pulmonary embolism ([place]) 06/20/2015  . Arthritis    osteoarthritis-knees. Chronic back pain  . Basal cell carcinoma of right shoulder    "burned off"  . Chronic SI joint pain   . DVT (deep venous thrombosis) ([place]) 04/2013   left lower leg, resulted in Pulmonary emboli on hormone therapy  . Dyspnea on exertion   . GERD (gastroesophageal reflux disease)   . Hypercholesterolemia   . Hypothyroidism   . Migraine    "< once/month" (12/04/2015)  . MVP (mitral valve prolapse)    asymptomatic  . Myocardial infarction ([place]) 11/19/2015   cardiac arrest  . PE (pulmonary embolism) Apr 18, 2013   tx. -using Xarelto now, no further lung problems, denies SOB on 01-02-14  . PONV (postoperative nausea and vomiting)   . RLS (restless legs syndrome)     Past Surgical History:  Procedure Laterality Date  . ANTERIOR CRUCIATE LIGAMENT REPAIR Left 1976; 1981   "open"  . CARDIAC CATHETERIZATION N/A 11/20/2015   Procedure: Left Heart Cath and Coronary Angiography;  Surgeon: Belva Crome, MD;  Location: [place] [facility];  Service: Cardiovascular;  Laterality: N/A;  . CARDIAC CATHETERIZATION N/A 12/04/2015   Procedure: Left Heart Cath and Coronary Angiography;  Surgeon: Peter M Martinique, MD;  Location: [place] [facility];  Service: Cardiovascular;  Laterality: N/A;  . CARDIAC CATHETERIZATION  12/04/2015   Procedure: Coronary Stent Intervention;  Surgeon: Peter M Martinique, MD;  Location: [place] [facility];  Service: Cardiovascular;;  . COLONOSCOPY  09/28/2011   Procedure: COLONOSCOPY;  Surgeon: Juanita Craver, MD;  [facility]: [facility];  Service: Endoscopy;  Laterality: N/A;  . COLONOSCOPY WITH PROPOFOL N/A 01/05/2017   Procedure: COLONOSCOPY WITH PROPOFOL;  Surgeon: Juanita Craver, MD;  [facility]: [facility];  Service: Endoscopy;  Laterality: N/A;  . JOINT REPLACEMENT    . LEFT HEART CATH AND CORONARY ANGIOGRAPHY N/A 10/08/2016   Procedure: Left Heart Cath and Coronary Angiography;  Surgeon: Peter M Martinique, MD;  Location: [place] [facility];  Service: Cardiovascular;  Laterality: N/A;  . TOTAL ABDOMINAL HYSTERECTOMY  1998  . TOTAL KNEE ARTHROPLASTY Left 01/08/2014   Procedure: LEFT TOTAL KNEE ARTHROPLASTY;  Surgeon: Gearlean Alf, MD;  Location: [facility];  Service: Orthopedics;  Laterality: Left;    There were no vitals filed for this visit.      Subjective Assessment - 03/30/17 0809    Subjective Still improving turning to the left.  MD has released me and said I can continue therapy as long as I needed.    Patient Stated Goals improve movement especially with driving and shopping; reduce pain   Currently in Pain? Yes   Pain Score 1    Pain Location Neck  Pain Orientation Right;Left   Pain Descriptors / Indicators Aching   Pain Type Acute pain   Pain Onset More than a month ago   Pain Frequency Intermittent   Aggravating Factors  turning head   Pain Relieving Factors not turning head   Multiple Pain Sites No                         [facility] Adult PT Treatment/Exercise - 03/30/17 0001      Neuro Re-ed    Neuro Re-ed Details  many yoga poses to improve cervical rotation and extension in standing and sitting     Neck Exercises: Machines for Strengthening   UBE (Upper Arm  Bike) level 1 2 min forward/2 min. backward     Manual Therapy   Manual Therapy Joint mobilization;Soft tissue mobilization   Manual therapy comments contract relax for bil. rotation 10x each way   Joint Mobilization sideglide and gapping of the left cervical facets C2-C5; sideglide of C2-C5 bil. ; snags to right side of C2-4 in sitting, left side of T1-T3   Soft tissue mobilization cervical paraspinals, suboccipitals, scalenes, around Left side of C2-C5   Passive ROM PROM to cervical for rotatioin                  PT Short Term Goals - 03/15/17 0809      PT SHORT TERM GOAL #3   Title ability to turn head with driving with pain decreased >/= 25% due to improved mobility   Time 4   Period Weeks   Status Achieved           PT Long Term Goals - 03/30/17 0810      PT LONG TERM GOAL #1   Title independent with HEP   Baseline still learning   Time 8   Period Weeks   Status On-going     PT LONG TERM GOAL #2   Title ability to turn her head without difficulty while driving due to improved  cervical ROM   Baseline moderate difficulty turning her head to the left   Time 8   Period Weeks   Status On-going     PT LONG TERM GOAL #3   Title abilty to shop in grocery store with looking at the signs with minimal to no difficulty and no headache   Baseline 60% better, gets a slight headache   Time 8   Period Weeks   Status On-going     PT LONG TERM GOAL #4   Title able to move head while working between 2 monitors with minimal to no difficulty due to improve cervical ROM   Baseline 70% better   Time 8   Period Weeks   Status On-going     PT LONG TERM GOAL #5   Title FOTO score </= 39% limitation   Time 8   Period Weeks   Status New               Plan - 03/30/17 0844    Clinical Impression Statement Patient reports pain is 70% better. Patient had increased cervical ROM with blocking her shoulders.  Patient reports moderate difficulty with turning her head  with  driving.  Patient reports 60% easier to turn between computer monitors. patient will benefit form skilled therapy to addresslimitation in ROM , strength and neuromuscluar control.    Rehab Potential Excellent   Clinical Impairments Affecting Rehab Potential hypothyroidism; cardiac history  PT Frequency 2x / week   PT Duration 8 weeks   PT Treatment/Interventions Cryotherapy;Electrical Stimulation;Moist Heat;Traction;Ultrasound;Therapeutic activities;Therapeutic exercise;Neuromuscular re-education;Patient/family education;Passive range of motion;Manual techniques;Dry needling   PT Next Visit Plan .  joint mobilization and STW to cervical to improve left rotation   PT Home Exercise Plan progress as needed   Consulted and Agree with Plan of Care Patient      Patient will benefit from skilled therapeutic intervention in order to improve the following deficits and impairments:  Decreased range of motion, Increased fascial restricitons, Pain, Decreased activity tolerance, Decreased mobility  Visit Diagnosis: Cervicalgia  Cramp and spasm     Problem List Patient Active Problem List   Diagnosis Date Noted  . CAD in native artery 12/04/2015  . Cardiomyopathy, ischemic 12/04/2015  . CAD (coronary artery disease), native coronary artery 12/04/2015  . History of pulmonary embolism 11/25/2015  . Lactic acidosis 11/25/2015  . AKI (acute kidney injury) ([place]) 11/25/2015  . Aspiration pneumonia ([place]) 11/25/2015  . Hyperglycemia 11/25/2015  . Normocytic anemia 11/25/2015  . Acute respiratory failure with hypoxia ([place])   . Cardiogenic shock ([place])   . NSTEMI (non-ST elevated myocardial infarction) ([place])   . Cardiac arrest ([place]) 11/19/2015  . Angina pectoris ([place]) 08/28/2015  . Retinal detachment 05/07/2014  . OA (osteoarthritis) of knee 01/08/2014  . Preoperative respiratory examination 11/22/2013  . Pulmonary embolism ([place]) 04/18/2013  . Chronic back pain 04/18/2013  . Dyspnea on  exertion 04/06/2013  . 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 Hypothyroidism   . Hypercholesterolemia     Earlie Counts, PT 03/30/17 8:47 AM   [facility] [address], [place], [place], [address] Phone: [phone]   Fax:  [phone]  Name: EMMALYNN PINKHAM MRN: [id number] Date of Birth: 10-03-1950

## 2017-04-02 ENCOUNTER — Ambulatory Visit: Payer: 59 | Admitting: Physical Therapy

## 2017-04-02 ENCOUNTER — Encounter: Payer: Self-pay | Admitting: Physical Therapy

## 2017-04-02 DIAGNOSIS — M542 Cervicalgia: Secondary | ICD-10-CM | POA: Diagnosis not present

## 2017-04-02 DIAGNOSIS — R252 Cramp and spasm: Secondary | ICD-10-CM | POA: Diagnosis not present

## 2017-04-02 NOTE — Therapy (Signed)
[facility] [address], [place], [place], [address] Phone: [phone]   Fax:  [phone]  Physical Therapy Treatment  Patient Details  Name: Brittany Mason MRN: [id number] Date of Birth: July 03, 1950 Referring Provider: Dr. Latanya Maudlin  Encounter Date: 04/02/2017      PT End of Session - 04/02/17 0922    Visit Number 13   Date for PT Re-Evaluation 04/15/17   Authorization Type UMR   PT Start Time 0845   PT Stop Time 0945   PT Time Calculation (min) 60 min   Activity Tolerance Patient tolerated treatment well   Behavior During Therapy [facility] for tasks assessed/performed      Past Medical History:  Diagnosis Date  . Acute pulmonary embolism ([place]) 06/20/2015  . Arthritis    osteoarthritis-knees. Chronic back pain  . Basal cell carcinoma of right shoulder    "burned off"  . Chronic SI joint pain   . DVT (deep venous thrombosis) ([place]) 04/2013   left lower leg, resulted in Pulmonary emboli on hormone therapy  . Dyspnea on exertion   . GERD (gastroesophageal reflux disease)   . Hypercholesterolemia   . Hypothyroidism   . Migraine    "< once/month" (12/04/2015)  . MVP (mitral valve prolapse)    asymptomatic  . Myocardial infarction ([place]) 11/19/2015   cardiac arrest  . PE (pulmonary embolism) Apr 18, 2013   tx. -using Xarelto now, no further lung problems, denies SOB on 01-02-14  . PONV (postoperative nausea and vomiting)   . RLS (restless legs syndrome)     Past Surgical History:  Procedure Laterality Date  . ANTERIOR CRUCIATE LIGAMENT REPAIR Left 1976; 1981   "open"  . CARDIAC CATHETERIZATION N/A 11/20/2015   Procedure: Left Heart Cath and Coronary Angiography;  Surgeon: Belva Crome, MD;  Location: [place] [facility];  Service: Cardiovascular;  Laterality: N/A;  . CARDIAC CATHETERIZATION N/A 12/04/2015   Procedure: Left Heart Cath and Coronary Angiography;  Surgeon: Peter M Martinique, MD;  Location: [place] [facility];  Service: Cardiovascular;  Laterality: N/A;  . CARDIAC CATHETERIZATION  12/04/2015   Procedure: Coronary Stent Intervention;  Surgeon: Peter M Martinique, MD;  Location: [place] [facility];  Service: Cardiovascular;;  . COLONOSCOPY  09/28/2011   Procedure: COLONOSCOPY;  Surgeon: Juanita Craver, MD;  [facility];  Service: Endoscopy;  Laterality: N/A;  . COLONOSCOPY WITH PROPOFOL N/A 01/05/2017   Procedure: COLONOSCOPY WITH PROPOFOL;  Surgeon: Juanita Craver, MD;  [facility];  Service: Endoscopy;  Laterality: N/A;  . JOINT REPLACEMENT    . LEFT HEART CATH AND CORONARY ANGIOGRAPHY N/A 10/08/2016   Procedure: Left Heart Cath and Coronary Angiography;  Surgeon: Peter M Martinique, MD;  Location: [place] [facility];  Service: Cardiovascular;  Laterality: N/A;  . TOTAL ABDOMINAL HYSTERECTOMY  1998  . TOTAL KNEE ARTHROPLASTY Left 01/08/2014   Procedure: LEFT TOTAL KNEE ARTHROPLASTY;  Surgeon: Gearlean Alf, MD;  [facility];  Service: Orthopedics;  Laterality: Left;    There were no vitals filed for this visit.      Subjective Assessment - 04/02/17 0852    Subjective Last visit was too aggressive.  I had a headache and my neck hurt.    Patient Stated Goals improve movement especially with driving and shopping; reduce pain   Currently in Pain? Yes   Pain Score 2    Pain Location Neck   Pain Orientation Right;Left   Pain Descriptors /  Indicators Aching   Pain Type Acute pain   Pain Onset More than a month ago   Pain Frequency Intermittent   Aggravating Factors  turning head   Pain Relieving Factors not turning head   Multiple Pain Sites No                         [facility] Adult PT Treatment/Exercise - 04/02/17 0001      Neck Exercises: Machines for Strengthening   UBE (Upper Arm Bike) level 2 2 min forward/2 min. backward     Neck Exercises: Standing   Other Standing Exercises cervical isometrics with ball- retraction hold 5 sec 5x/ sidebending  hole 5 sec 5x, ; rotation hold 5 sec 5x bil.    Other Standing Exercises doorway stretch bil. hold 15 sec, 4x     Neck Exercises: Prone   Other Prone Exercise prone on elbows and push head upward 10x     Traction   Type of Traction Cervical   Min (lbs) 5   Max (lbs) 18   Time 15 min     Manual Therapy   Manual Therapy Soft tissue mobilization;Joint mobilization   Joint Mobilization upslide and downslide glide of left C3-C6 facet   Soft tissue mobilization cervical paraspinals, suboccipitals, scalenes, around Left side of C2-C5                  PT Short Term Goals - 03/15/17 0809      PT SHORT TERM GOAL #3   Title ability to turn head with driving with pain decreased >/= 25% due to improved mobility   Time 4   Period Weeks   Status Achieved           PT Long Term Goals - 03/30/17 0810      PT LONG TERM GOAL #1   Title independent with HEP   Baseline still learning   Time 8   Period Weeks   Status On-going     PT LONG TERM GOAL #2   Title ability to turn her head without difficulty while driving due to improved  cervical ROM   Baseline moderate difficulty turning her head to the left   Time 8   Period Weeks   Status On-going     PT LONG TERM GOAL #3   Title abilty to shop in grocery store with looking at the signs with minimal to no difficulty and no headache   Baseline 60% better, gets a slight headache   Time 8   Period Weeks   Status On-going     PT LONG TERM GOAL #4   Title able to move head while working between 2 monitors with minimal to no difficulty due to improve cervical ROM   Baseline 70% better   Time 8   Period Weeks   Status On-going     PT LONG TERM GOAL #5   Title FOTO score </= 39% limitation   Time 8   Period Weeks   Status New               Plan - 04/02/17 0904    Clinical Impression Statement Patient had flare-up from last visit with the mobilization to her cervical.  This visit was more gentle.  Patient still  has difficulty turning to the left. Patient has tightness in left C3-C6 facets. Patient ill benefit from skilled therapy to address limitation in ROM, strength and neuromuscular control.  Rehab Potential Excellent   Clinical Impairments Affecting Rehab Potential hypothyroidism; cardiac history   PT Frequency 2x / week   PT Duration 8 weeks   PT Treatment/Interventions Cryotherapy;Electrical Stimulation;Moist Heat;Traction;Ultrasound;Therapeutic activities;Therapeutic exercise;Neuromuscular re-education;Patient/family education;Passive range of motion;Manual techniques;Dry needling   PT Next Visit Plan See how cervical traction did for patient; cervical stabilization; joint mobilization   PT Home Exercise Plan progress as needed   Consulted and Agree with Plan of Care Patient      Patient will benefit from skilled therapeutic intervention in order to improve the following deficits and impairments:  Decreased range of motion, Increased fascial restricitons, Pain, Decreased activity tolerance, Decreased mobility  Visit Diagnosis: Cervicalgia  Cramp and spasm     Problem List Patient Active Problem List   Diagnosis Date Noted  . CAD in native artery 12/04/2015  . Cardiomyopathy, ischemic 12/04/2015  . CAD (coronary artery disease), native coronary artery 12/04/2015  . History of pulmonary embolism 11/25/2015  . Lactic acidosis 11/25/2015  . AKI (acute kidney injury) ([place]) 11/25/2015  . Aspiration pneumonia ([place]) 11/25/2015  . Hyperglycemia 11/25/2015  . Normocytic anemia 11/25/2015  . Acute respiratory failure with hypoxia ([place])   . Cardiogenic shock ([place])   . NSTEMI (non-ST elevated myocardial infarction) ([place])   . Cardiac arrest ([place]) 11/19/2015  . Angina pectoris ([place]) 08/28/2015  . Retinal detachment 05/07/2014  . OA (osteoarthritis) of knee 01/08/2014  . Preoperative respiratory examination 11/22/2013  . Pulmonary embolism ([place]) 04/18/2013  . Chronic back pain 04/18/2013   . Dyspnea on exertion 04/06/2013  . 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 Hypothyroidism   . Hypercholesterolemia     Earlie Counts, PT 04/02/17 9:28 AM   [facility] [address] [place], [place], [address] Phone: [phone]   Fax:  [phone]  Name: Brittany Mason MRN: [id number] Date of Birth: 01-Jan-1951

## 2017-04-06 ENCOUNTER — Encounter: Payer: Self-pay | Admitting: Physical Therapy

## 2017-04-06 ENCOUNTER — Ambulatory Visit: Payer: 59 | Admitting: Physical Therapy

## 2017-04-06 DIAGNOSIS — R252 Cramp and spasm: Secondary | ICD-10-CM | POA: Diagnosis not present

## 2017-04-06 DIAGNOSIS — M542 Cervicalgia: Secondary | ICD-10-CM

## 2017-04-06 MED FILL — SIMVASTATIN 40 MG TABLET: 40 | 90 days supply | Qty: 90 | Fill #1

## 2017-04-06 MED FILL — FENOFIBRATE 160 MG TABLET: 160 | 90 days supply | Qty: 90 | Fill #0

## 2017-04-06 NOTE — Therapy (Signed)
[facility] [address] [place], [place], [address] Phone: [phone]   Fax:  [phone]  Physical Therapy Treatment  Patient Details  Name: Brittany Mason MRN: [id number] Date of Birth: [birth year] Referring Provider: Dr. Latanya Maudlin  Encounter Date: 04/06/2017      PT End of Session - 04/06/17 0838    Visit Number 14   Date for PT Re-Evaluation 04/15/17   Authorization Type UMR   PT Start Time 0800   PT Stop Time 0900   PT Time Calculation (min) 60 min   Activity Tolerance Patient tolerated treatment well   Behavior During Therapy [facility] for tasks assessed/performed      Past Medical History:  Diagnosis Date  . Acute pulmonary embolism ([place]) 06/20/2015  . Arthritis    osteoarthritis-knees. Chronic back pain  . Basal cell carcinoma of right shoulder    "burned off"  . Chronic SI joint pain   . DVT (deep venous thrombosis) ([place]) 04/2013   left lower leg, resulted in Pulmonary emboli on hormone therapy  . Dyspnea on exertion   . GERD (gastroesophageal reflux disease)   . Hypercholesterolemia   . Hypothyroidism   . Migraine    "< once/month" (12/04/2015)  . MVP (mitral valve prolapse)    asymptomatic  . Myocardial infarction ([place]) 11/19/2015   cardiac arrest  . PE (pulmonary embolism) Apr 18, 2013   tx. -using Xarelto now, no further lung problems, denies SOB on 01-02-14  . PONV (postoperative nausea and vomiting)   . RLS (restless legs syndrome)     Past Surgical History:  Procedure Laterality Date  . ANTERIOR CRUCIATE LIGAMENT REPAIR Left 1976; 1981   "open"  . CARDIAC CATHETERIZATION N/A 11/20/2015   Procedure: Left Heart Cath and Coronary Angiography;  Surgeon: Belva Crome, MD;  Location: [place] [facility];  Service: Cardiovascular;  Laterality: N/A;  . CARDIAC CATHETERIZATION N/A 12/04/2015   Procedure: Left Heart Cath and Coronary Angiography;  Surgeon: Peter M Martinique, MD;  Location: [place] [facility];  Service: Cardiovascular;  Laterality: N/A;  . CARDIAC CATHETERIZATION  12/04/2015   Procedure: Coronary Stent Intervention;  Surgeon: Peter M Martinique, MD;  Location: [place] [facility];  Service: Cardiovascular;;  . COLONOSCOPY  09/28/2011   Procedure: COLONOSCOPY;  Surgeon: Juanita Craver, MD;  [facility];  Service: Endoscopy;  Laterality: N/A;  . COLONOSCOPY WITH PROPOFOL N/A 01/05/2017   Procedure: COLONOSCOPY WITH PROPOFOL;  Surgeon: Juanita Craver, MD;  [facility];  Service: Endoscopy;  Laterality: N/A;  . JOINT REPLACEMENT    . LEFT HEART CATH AND CORONARY ANGIOGRAPHY N/A 10/08/2016   Procedure: Left Heart Cath and Coronary Angiography;  Surgeon: Peter M Martinique, MD;  Location: [place] [facility];  Service: Cardiovascular;  Laterality: N/A;  . TOTAL ABDOMINAL HYSTERECTOMY  1998  . TOTAL KNEE ARTHROPLASTY Left 01/08/2014   Procedure: LEFT TOTAL KNEE ARTHROPLASTY;  Surgeon: Gearlean Alf, MD;  [facility];  Service: Orthopedics;  Laterality: Left;    There were no vitals filed for this visit.      Subjective Assessment - 04/06/17 0806    Subjective I had a headache the next day after therapy. Ihave full rotation going to the right.  I am limited with left rotation but i am just able to see the car behind me.    Patient Stated Goals improve movement especially with driving and shopping; reduce pain   Currently in Pain? Yes  Pain Score 1    Pain Location Neck   Pain Orientation Right;Left   Pain Descriptors / Indicators Aching   Pain Type Acute pain   Pain Onset More than a month ago   Pain Frequency Intermittent   Aggravating Factors  turning head   Pain Relieving Factors not turning head   Multiple Pain Sites No                         [facility] Adult PT Treatment/Exercise - 04/06/17 0001      Modalities   Modalities Traction     Traction   Type of Traction Cervical   Min (lbs) 5   Max (lbs) 20   Time 15 min     Manual  Therapy   Manual Therapy Soft tissue mobilization;Joint mobilization   Joint Mobilization upslide and downslide glide of left C3-C6 facet; PA mobilization to C2-C5 grade 3 in prone   Soft tissue mobilization cervical paraspinals, suboccipitals, scalenes, around Left side of C2-C5   Passive ROM PROM to cervical for rotatioin and sidebend          Trigger Point Dry Needling - 04/06/17 0819    Consent Given? Yes   Education Handout Provided Yes   Muscles Treated Upper Body Oblique capitus;Suboccipitals muscle group   Muscles Treated Lower Body --  bil. multifidi of C2-C4 bil.    Oblique Capitus Response Twitch response elicited;Palpable increased muscle length   SubOccipitals Response Twitch response elicited;Palpable increased muscle length              PT Education - 04/06/17 0837    Education provided Yes   Education Details information on dry needling   Person(s) Educated Patient   Methods Explanation;Handout   Comprehension Verbalized understanding          PT Short Term Goals - 03/15/17 0809      PT SHORT TERM GOAL #3   Title ability to turn head with driving with pain decreased >/= 25% due to improved mobility   Time 4   Period Weeks   Status Achieved           PT Long Term Goals - 04/06/17 0840      PT LONG TERM GOAL #1   Title independent with HEP   Baseline still learning   Time 8   Period Weeks   Status On-going     PT LONG TERM GOAL #2   Title ability to turn her head without difficulty while driving due to improved  cervical ROM   Baseline moderate difficulty turning her head to the left   Time 8   Period Weeks   Status On-going     PT LONG TERM GOAL #3   Title abilty to shop in grocery store with looking at the signs with minimal to no difficulty and no headache   Time 8   Period Weeks   Status Achieved     PT LONG TERM GOAL #4   Title able to move head while working between 2 monitors with minimal to no difficulty due to improve  cervical ROM   Time 8   Period Weeks   Status Achieved               Plan - 04/06/17 0840    Clinical Impression Statement Patient can turn her head between monitors and looking at signs in the store without difficulty.  Patient has full right cervical rotation but limited  by 50% prior to therapy and after therapy limited by 25%.  Patient has tightness in C1-C3 facets.  Patient will benefit from skilled therapy to address limitation in ROM, strength and neuromuscular control.    Rehab Potential Excellent   Clinical Impairments Affecting Rehab Potential hypothyroidism; cardiac history   PT Frequency 2x / week   PT Duration 8 weeks   PT Treatment/Interventions Cryotherapy;Electrical Stimulation;Moist Heat;Traction;Ultrasound;Therapeutic activities;Therapeutic exercise;Neuromuscular re-education;Patient/family education;Passive range of motion;Manual techniques;Dry needling   PT Next Visit Plan cervical traction did for patient; cervical stabilization; joint mobilization; see if dry needling helped   PT Home Exercise Plan progress as needed   Consulted and Agree with Plan of Care Patient      Patient will benefit from skilled therapeutic intervention in order to improve the following deficits and impairments:  Decreased range of motion, Increased fascial restricitons, Pain, Decreased activity tolerance, Decreased mobility  Visit Diagnosis: Cervicalgia  Cramp and spasm     Problem List Patient Active Problem List   Diagnosis Date Noted  . CAD in native artery 12/04/2015  . Cardiomyopathy, ischemic 12/04/2015  . CAD (coronary artery disease), native coronary artery 12/04/2015  . History of pulmonary embolism 11/25/2015  . Lactic acidosis 11/25/2015  . AKI (acute kidney injury) ([place]) 11/25/2015  . Aspiration pneumonia ([place]) 11/25/2015  . Hyperglycemia 11/25/2015  . Normocytic anemia 11/25/2015  . Acute respiratory failure with hypoxia ([place])   . Cardiogenic shock ([place])   .  NSTEMI (non-ST elevated myocardial infarction) ([place])   . Cardiac arrest ([place]) 11/19/2015  . Angina pectoris ([place]) 08/28/2015  . Retinal detachment 05/07/2014  . OA (osteoarthritis) of knee 01/08/2014  . Preoperative respiratory examination 11/22/2013  . Pulmonary embolism ([place]) 04/18/2013  . Chronic back pain 04/18/2013  . Dyspnea on exertion 04/06/2013  . 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 Hypothyroidism   . Hypercholesterolemia     Earlie Counts, PT 04/06/17 8:43 AM   [facility] [address], [place], [place], [address] Phone: [phone]   Fax:  [phone]  Name: Brittany Mason MRN: [id number] Date of Birth: 07-30-1950

## 2017-04-06 NOTE — Patient Instructions (Signed)

## 2017-04-09 ENCOUNTER — Encounter: Payer: Self-pay | Admitting: Physical Therapy

## 2017-04-09 ENCOUNTER — Ambulatory Visit: Payer: 59 | Attending: Orthopedic Surgery | Admitting: Physical Therapy

## 2017-04-09 DIAGNOSIS — M542 Cervicalgia: Secondary | ICD-10-CM | POA: Insufficient documentation

## 2017-04-09 DIAGNOSIS — R252 Cramp and spasm: Secondary | ICD-10-CM | POA: Insufficient documentation

## 2017-04-09 NOTE — Therapy (Signed)
[facility] [address], [place], [place], [address] Phone: [phone]   Fax:  [phone]  Physical Therapy Treatment  Patient Details  Name: Brittany Mason MRN: [id number] Date of Birth: September 14, 1950 Referring Provider: Dr. Latanya Maudlin  Encounter Date: 04/09/2017      PT End of Session - 04/09/17 0807    Visit Number 15   Date for PT Re-Evaluation 04/15/17   Authorization Type UMR   PT Start Time 0804   PT Stop Time 0900   PT Time Calculation (min) 56 min   Activity Tolerance Patient tolerated treatment well   Behavior During Therapy [facility] for tasks assessed/performed      Past Medical History:  Diagnosis Date  . Acute pulmonary embolism ([place]) 06/20/2015  . Arthritis    osteoarthritis-knees. Chronic back pain  . Basal cell carcinoma of right shoulder    "burned off"  . Chronic SI joint pain   . DVT (deep venous thrombosis) ([place]) 04/2013   left lower leg, resulted in Pulmonary emboli on hormone therapy  . Dyspnea on exertion   . GERD (gastroesophageal reflux disease)   . Hypercholesterolemia   . Hypothyroidism   . Migraine    "< once/month" (12/04/2015)  . MVP (mitral valve prolapse)    asymptomatic  . Myocardial infarction ([place]) 11/19/2015   cardiac arrest  . PE (pulmonary embolism) Apr 18, 2013   tx. -using Xarelto now, no further lung problems, denies SOB on 01-02-14  . PONV (postoperative nausea and vomiting)   . RLS (restless legs syndrome)     Past Surgical History:  Procedure Laterality Date  . ANTERIOR CRUCIATE LIGAMENT REPAIR Left 1976; 1981   "open"  . CARDIAC CATHETERIZATION N/A 11/20/2015   Procedure: Left Heart Cath and Coronary Angiography;  Surgeon: Belva Crome, MD;  Location: [place] [facility];  Service: Cardiovascular;  Laterality: N/A;  . CARDIAC CATHETERIZATION N/A 12/04/2015   Procedure: Left Heart Cath and Coronary Angiography;  Surgeon: Peter M Martinique, MD;  Location: [place] [facility];  Service: Cardiovascular;  Laterality: N/A;  . CARDIAC CATHETERIZATION  12/04/2015   Procedure: Coronary Stent Intervention;  Surgeon: Peter M Martinique, MD;  Location: [place] [facility];  Service: Cardiovascular;;  . COLONOSCOPY  09/28/2011   Procedure: COLONOSCOPY;  Surgeon: Juanita Craver, MD;  [facility];  Service: Endoscopy;  Laterality: N/A;  . COLONOSCOPY WITH PROPOFOL N/A 01/05/2017   Procedure: COLONOSCOPY WITH PROPOFOL;  Surgeon: Juanita Craver, MD;  [facility];  Service: Endoscopy;  Laterality: N/A;  . JOINT REPLACEMENT    . LEFT HEART CATH AND CORONARY ANGIOGRAPHY N/A 10/08/2016   Procedure: Left Heart Cath and Coronary Angiography;  Surgeon: Peter M Martinique, MD;  Location: [place] [facility];  Service: Cardiovascular;  Laterality: N/A;  . TOTAL ABDOMINAL HYSTERECTOMY  1998  . TOTAL KNEE ARTHROPLASTY Left 01/08/2014   Procedure: LEFT TOTAL KNEE ARTHROPLASTY;  Surgeon: Gearlean Alf, MD;  [facility];  Service: Orthopedics;  Laterality: Left;    There were no vitals filed for this visit.      Subjective Assessment - 04/09/17 0806    Subjective I am having less pain.  I do not know if I am having more rotation.    Patient Stated Goals improve movement especially with driving and shopping; reduce pain   Currently in Pain? No/denies            [facility] PT Assessment - 04/09/17 0001  AROM   Cervical - Left Rotation 75  after treatment                     [facility] Adult PT Treatment/Exercise - 04/09/17 0001      Neuro Re-ed    Neuro Re-ed Details  sitting thoracic extension with hands  behind head; sitting extension and thoracic extension with rotation to the left with overpressure in the right C1/C2 vertebrae; cervical extension with strap, thoracic rotation bil. with overpressure in the right rib cage     Neck Exercises: Machines for Strengthening   UBE (Upper Arm Bike) level 2 2 min forward/2 min. backward     Neck  Exercises: Seated   Other Seated Exercise self mobilization with strap to cervical with extension     Manual Therapy   Manual Therapy Soft tissue mobilization;Joint mobilization   Joint Mobilization bil. scapula mobilization in sidely; left first rib mobilization; upslide glide of bil. C1-C5 with different movements   Soft tissue mobilization interscapular muscles; upper thoracic parapsinals                  PT Short Term Goals - 03/15/17 0809      PT SHORT TERM GOAL #3   Title ability to turn head with driving with pain decreased >/= 25% due to improved mobility   Time 4   Period Weeks   Status Achieved           PT Long Term Goals - 04/06/17 0840      PT LONG TERM GOAL #1   Title independent with HEP   Baseline still learning   Time 8   Period Weeks   Status On-going     PT LONG TERM GOAL #2   Title ability to turn her head without difficulty while driving due to improved  cervical ROM   Baseline moderate difficulty turning her head to the left   Time 8   Period Weeks   Status On-going     PT LONG TERM GOAL #3   Title abilty to shop in grocery store with looking at the signs with minimal to no difficulty and no headache   Time 8   Period Weeks   Status Achieved     PT LONG TERM GOAL #4   Title able to move head while working between 2 monitors with minimal to no difficulty due to improve cervical ROM   Time 8   Period Weeks   Status Achieved               Plan - 04/09/17 2505    Clinical Impression Statement Patient had tightness in the cervical thoracic region and scapula. After therapy, patient was able to open her chest further, relax her shoulders more, improve scapula mobility, and maintains cervical rotation.  Patient reports she is having less pain since last treatment and has not complained of a headache.  Patient will benefit from skilled therpay to address limitation in ROM, strength and neuromuscular control.    Rehab Potential  Excellent   Clinical Impairments Affecting Rehab Potential hypothyroidism; cardiac history   PT Frequency 2x / week   PT Duration 8 weeks   PT Treatment/Interventions Cryotherapy;Electrical Stimulation;Moist Heat;Traction;Ultrasound;Therapeutic activities;Therapeutic exercise;Neuromuscular re-education;Patient/family education;Passive range of motion;Manual techniques;Dry needling   PT Next Visit Plan cervical traction did for patient; cervical stabilization; joint mobilization; see if dry needling helped   PT Home Exercise Plan progress as needed   Consulted and Agree with Plan  of Care Patient      Patient will benefit from skilled therapeutic intervention in order to improve the following deficits and impairments:  Decreased range of motion, Increased fascial restricitons, Pain, Decreased activity tolerance, Decreased mobility  Visit Diagnosis: Cervicalgia  Cramp and spasm     Problem List Patient Active Problem List   Diagnosis Date Noted  . CAD in native artery 12/04/2015  . Cardiomyopathy, ischemic 12/04/2015  . CAD (coronary artery disease), native coronary artery 12/04/2015  . History of pulmonary embolism 11/25/2015  . Lactic acidosis 11/25/2015  . AKI (acute kidney injury) ([place]) 11/25/2015  . Aspiration pneumonia ([place]) 11/25/2015  . Hyperglycemia 11/25/2015  . Normocytic anemia 11/25/2015  . Acute respiratory failure with hypoxia ([place])   . Cardiogenic shock ([place])   . NSTEMI (non-ST elevated myocardial infarction) ([place])   . Cardiac arrest ([place]) 11/19/2015  . Angina pectoris ([place]) 08/28/2015  . Retinal detachment 05/07/2014  . OA (osteoarthritis) of knee 01/08/2014  . Preoperative respiratory examination 11/22/2013  . Pulmonary embolism ([place]) 04/18/2013  . Chronic back pain 04/18/2013  . Dyspnea on exertion 04/06/2013  . 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 Hypothyroidism   . Hypercholesterolemia     Earlie Counts, PT 04/09/17 8:55 AM   [facility] [address] [place], [place], [address] Phone: [phone]   Fax:  [phone]  Name: Brittany Mason MRN: [id number] Date of Birth: 21-Nov-1950

## 2017-04-13 ENCOUNTER — Ambulatory Visit: Payer: 59 | Admitting: Physical Therapy

## 2017-04-13 ENCOUNTER — Encounter: Payer: Self-pay | Admitting: Physical Therapy

## 2017-04-13 DIAGNOSIS — M542 Cervicalgia: Secondary | ICD-10-CM | POA: Diagnosis not present

## 2017-04-13 DIAGNOSIS — R252 Cramp and spasm: Secondary | ICD-10-CM

## 2017-04-13 NOTE — Therapy (Signed)
[facility] [address], [place], [place], [address] Phone: [phone]   Fax:  [phone]  Physical Therapy Treatment  Patient Details  Name: Brittany Mason MRN: [id number] Date of Birth: 1950/10/26 Referring Provider: Dr. Latanya Maudlin   Encounter Date: 04/13/2017  PT End of Session - 04/13/17 0835    Visit Number  16    Date for PT Re-Evaluation  04/15/17    Authorization Type  UMR    PT Start Time  0805    PT Stop Time  0900    PT Time Calculation (min)  55 min    Activity Tolerance  Patient tolerated treatment well    Behavior During Therapy  [facility] for tasks assessed/performed       Past Medical History:  Diagnosis Date  . Acute pulmonary embolism ([place]) 06/20/2015  . Arthritis    osteoarthritis-knees. Chronic back pain  . Basal cell carcinoma of right shoulder    "burned off"  . Chronic SI joint pain   . DVT (deep venous thrombosis) ([place]) 04/2013   left lower leg, resulted in Pulmonary emboli on hormone therapy  . Dyspnea on exertion   . GERD (gastroesophageal reflux disease)   . Hypercholesterolemia   . Hypothyroidism   . Migraine    "< once/month" (12/04/2015)  . MVP (mitral valve prolapse)    asymptomatic  . Myocardial infarction ([place]) 11/19/2015   cardiac arrest  . PE (pulmonary embolism) Apr 18, 2013   tx. -using Xarelto now, no further lung problems, denies SOB on 01-02-14  . PONV (postoperative nausea and vomiting)   . RLS (restless legs syndrome)     Past Surgical History:  Procedure Laterality Date  . ANTERIOR CRUCIATE LIGAMENT REPAIR Left 1976; 1981   "open"  . JOINT REPLACEMENT    . TOTAL ABDOMINAL HYSTERECTOMY  1998    There were no vitals filed for this visit.  Subjective Assessment - 04/13/17 0806    Subjective  I feel traction helps me not be in pain.     Patient Stated Goals  improve movement especially with driving and shopping; reduce pain    Currently in Pain?  Yes    Pain Score  1     Pain Location  Neck    Pain Orientation  Lower;Left;Right    Pain Descriptors / Indicators  Aching    Pain Type  Acute pain    Pain Onset  More than a month ago    Pain Frequency  Intermittent    Aggravating Factors   turning head    Pain Relieving Factors  not turning head    Multiple Pain Sites  No         [facility] PT Assessment - 04/13/17 0001      AROM   Cervical - Left Rotation  85                  [facility] Adult PT Treatment/Exercise - 04/13/17 0001      Neck Exercises: Seated   Other Seated Exercise  cervical rotation to left with therapist doing SNAGS to C2 on right      Neck Exercises: Supine   Other Supine Exercise  neck on foam roll doing the MELT method to improve movement and increase tissue mobility; lay with thoracic on roll and extens spine;    Other Supine Exercise  supine laying on foam roll along spine:chinretraction with edge of roll at T1, head on roll alternate shoulder flexion,  bil. shoulder flexion, diagonals holding green plyoball      Modalities   Modalities  Traction      Traction   Type of Traction  Cervical    Min (lbs)  5    Max (lbs)  20    Time  15 min               PT Short Term Goals - 03/15/17 0809      PT SHORT TERM GOAL #3   Title  ability to turn head with driving with pain decreased >/= 25% due to improved mobility    Time  4    Period  Weeks    Status  Achieved        PT Long Term Goals - 04/13/17 4270      PT LONG TERM GOAL #1   Title  independent with HEP    Baseline  still learning    Time  8    Period  Weeks    Status  On-going      PT LONG TERM GOAL #2   Title  ability to turn her head without difficulty while driving due to improved  cervical ROM    Baseline  full right rotation, left is limited by 40%    Time  8    Period  Weeks    Status  On-going      PT LONG TERM GOAL #3   Title  abilty to shop in grocery store with looking at the signs with minimal to no difficulty and  no headache    Baseline  --    Time  8    Period  Weeks    Status  Achieved      PT LONG TERM GOAL #4   Title  able to move head while working between 2 monitors with minimal to no difficulty due to improve cervical ROM    Baseline  --    Time  8    Period  Weeks    Status  Achieved      PT LONG TERM GOAL #5   Title  FOTO score </= 39% limitation    Time  8    Period  Weeks    Status  New            Plan - 04/13/17 6237    Clinical Impression Statement  Patient has increased cervical rotation to left to 85 degrees.  Patient reports she is 60% better with rotation to left.  Patient has tightness in right C1-C3 restricting left rotation.  Patient will benefit from skilled therapy to address limitation in ROM, strength and neuromuscular control.     Rehab Potential  Excellent    Clinical Impairments Affecting Rehab Potential  hypothyroidism; cardiac history    PT Frequency  2x / week    PT Duration  8 weeks    PT Treatment/Interventions  Cryotherapy;Electrical Stimulation;Moist Heat;Traction;Ultrasound;Therapeutic activities;Therapeutic exercise;Neuromuscular re-education;Patient/family education;Passive range of motion;Manual techniques;Dry needling    PT Next Visit Plan  cervical traction did for patient; cervical stabilization; joint mobilization; see if dry needling helped    PT Home Exercise Plan  progress as needed    Consulted and Agree with Plan of Care  Patient       Patient will benefit from skilled therapeutic intervention in order to improve the following deficits and impairments:  Decreased range of motion, Increased fascial restricitons, Pain, Decreased activity tolerance, Decreased mobility  Visit Diagnosis: Cervicalgia  Cramp and  spasm     Problem List Patient Active Problem List   Diagnosis Date Noted  . CAD in native artery 12/04/2015  . Cardiomyopathy, ischemic 12/04/2015  . CAD (coronary artery disease), native coronary artery 12/04/2015  .  History of pulmonary embolism 11/25/2015  . Lactic acidosis 11/25/2015  . AKI (acute kidney injury) ([place]) 11/25/2015  . Aspiration pneumonia ([place]) 11/25/2015  . Hyperglycemia 11/25/2015  . Normocytic anemia 11/25/2015  . Acute respiratory failure with hypoxia ([place])   . Cardiogenic shock ([place])   . NSTEMI (non-ST elevated myocardial infarction) ([place])   . Cardiac arrest ([place]) 11/19/2015  . Angina pectoris ([place]) 08/28/2015  . Retinal detachment 05/07/2014  . OA (osteoarthritis) of knee 01/08/2014  . Preoperative respiratory examination 11/22/2013  . Pulmonary embolism ([place]) 04/18/2013  . Chronic back pain 04/18/2013  . Dyspnea on exertion 04/06/2013  . 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 Hypothyroidism   . Hypercholesterolemia     Earlie Counts, PT 04/13/17 8:40 AM   [facility] [address] [place], [place], [address] Phone: [phone]   Fax:  [phone]  Name: Brittany Mason MRN: [id number] Date of Birth: [birth year]

## 2017-04-15 ENCOUNTER — Encounter: Payer: Self-pay | Admitting: Physical Therapy

## 2017-04-15 ENCOUNTER — Other Ambulatory Visit: Payer: Self-pay

## 2017-04-15 ENCOUNTER — Ambulatory Visit: Payer: 59 | Admitting: Physical Therapy

## 2017-04-15 DIAGNOSIS — M542 Cervicalgia: Secondary | ICD-10-CM

## 2017-04-15 DIAGNOSIS — R252 Cramp and spasm: Secondary | ICD-10-CM

## 2017-04-15 NOTE — Therapy (Signed)
[facility] [address], [place], [place], [address] Phone: [phone]   Fax:  [phone]  Physical Therapy Treatment  Patient Details  Name: Brittany Mason MRN: [id number] Date of Birth: Sep 04, 1950 Referring Provider: Dr. Latanya Maudlin   Encounter Date: 04/15/2017  PT End of Session - 04/15/17 0806    Visit Number  17    Date for PT Re-Evaluation  05/06/17    Authorization Type  UMR    PT Start Time  0805    PT Stop Time  0900    PT Time Calculation (min)  55 min    Activity Tolerance  Patient tolerated treatment well    Behavior During Therapy  [facility] for tasks assessed/performed       Past Medical History:  Diagnosis Date  . Acute pulmonary embolism ([place]) 06/20/2015  . Arthritis    osteoarthritis-knees. Chronic back pain  . Basal cell carcinoma of right shoulder    "burned off"  . Chronic SI joint pain   . DVT (deep venous thrombosis) ([place]) 04/2013   left lower leg, resulted in Pulmonary emboli on hormone therapy  . Dyspnea on exertion   . GERD (gastroesophageal reflux disease)   . Hypercholesterolemia   . Hypothyroidism   . Migraine    "< once/month" (12/04/2015)  . MVP (mitral valve prolapse)    asymptomatic  . Myocardial infarction ([employer]) 11/19/2015   cardiac arrest  . PE (pulmonary embolism) Apr 18, 2013   tx. -using Xarelto now, no further lung problems, denies SOB on 01-02-14  . PONV (postoperative nausea and vomiting)   . RLS (restless legs syndrome)     Past Surgical History:  Procedure Laterality Date  . ANTERIOR CRUCIATE LIGAMENT REPAIR Left 1976; 1981   "open"  . JOINT REPLACEMENT    . TOTAL ABDOMINAL HYSTERECTOMY  1998    There were no vitals filed for this visit.  Subjective Assessment - 04/15/17 0805    Subjective  I can turn my head enough to see the road to the left when driving    Patient Stated Goals  improve movement especially with driving and shopping; reduce pain    Currently in Pain?  Yes    Pain Score  1     Pain Location  Neck    Pain Descriptors / Indicators  Aching    Pain Type  Acute pain    Pain Onset  More than a month ago    Pain Frequency  Intermittent    Aggravating Factors   turning head to left    Pain Relieving Factors  not turning head    Multiple Pain Sites  No         [facility] PT Assessment - 04/15/17 0001      Assessment   Medical Diagnosis  M54.2 Cervical pain    Referring Provider  Dr. Latanya Maudlin    Onset Date/Surgical Date  11/16/16    Prior Therapy  None      Precautions   Precautions  None      Restrictions   Weight Bearing Restrictions  No      Balance Screen   Has the patient fallen in the past 6 months  No    Has the patient had a decrease in activity level because of a fear of falling?   No    Is the patient reluctant to leave their home because of a fear of falling?   No  [place] residence      Prior Function   Level of Independence  Independent      Cognition   Overall Cognitive Status  Within Functional Limits for tasks assessed      Observation/Other Assessments   Focus on Therapeutic Outcomes (FOTO)   58% limitation      AROM   Cervical Flexion  full    Cervical Extension  55    Cervical - Right Side Bend  38    Cervical - Left Side Bend  35    Cervical - Right Rotation  80    Cervical - Left Rotation  70 prior to therapy   prior to therapy     Transfers   Transfers  Not assessed      Ambulation/Gait   Ambulation/Gait  No                  [facility] Adult PT Treatment/Exercise - 04/15/17 0001      Neck Exercises: Machines for Strengthening   UBE (Upper Arm Bike)  level 2 2 min forward/2 min. backward      Neck Exercises: Seated   Other Seated Exercise  cervical rotation to left with therapist doing SNAGS to C2 on right      Neck Exercises: Supine   Other Supine Exercise  neck on foam roll doing the MELT method to improve  movement and increase tissue mobility; lay with thoracic on roll and extens spine;    Other Supine Exercise  supine laying on foam roll along spine:chinretraction with edge of roll at T1, head on roll alternate shoulder flexion, bil. shoulder flexion, diagonals holding green plyoball      Modalities   Modalities  Traction      Traction   Type of Traction  Cervical    Min (lbs)  5    Max (lbs)  20    Time  15 min               PT Short Term Goals - 03/15/17 0809      PT SHORT TERM GOAL #3   Title  ability to turn head with driving with pain decreased >/= 25% due to improved mobility    Time  4    Period  Weeks    Status  Achieved        PT Long Term Goals - 04/13/17 2376      PT LONG TERM GOAL #1   Title  independent with HEP    Baseline  still learning    Time  8    Period  Weeks    Status  On-going      PT LONG TERM GOAL #2   Title  ability to turn her head without difficulty while driving due to improved  cervical ROM    Baseline  full right rotation, left is limited by 40%    Time  8    Period  Weeks    Status  On-going      PT LONG TERM GOAL #3   Title  abilty to shop in grocery store with looking at the signs with minimal to no difficulty and no headache    Baseline  --    Time  8    Period  Weeks    Status  Achieved      PT LONG TERM GOAL #4   Title  able to move head while working between 2 monitors  with minimal to no difficulty due to improve cervical ROM    Baseline  --    Time  8    Period  Weeks    Status  Achieved      PT LONG TERM GOAL #5   Title  FOTO score </= 39% limitation    Time  8    Period  Weeks    Status  New            Plan - 04/15/17 0843    Clinical Impression Statement  Patient reports she is 60% better with most difficulty turning to the left.  [place] has tighness in right C1-C3 and tightness on right C3-C5 facet.  Patient cervical rotation to the left is 75 degrees and right is 85 degrees.  Patient has full  cervical flexion and extension.  Patient is able to turn her head side to side while looking at her computer and signs overhead without getting a headache.  Patient still has difficutly with turning her head to the left with driving which is a safety factor. Patient will benefit from skilled therapy to address limitation in left cervical ROM and neuromuscular control.     Rehab Potential  Excellent    Clinical Impairments Affecting Rehab Potential  hypothyroidism; cardiac history    PT Frequency  2x / week    PT Duration  3 weeks    PT Treatment/Interventions  Cryotherapy;Electrical Stimulation;Moist Heat;Traction;Ultrasound;Therapeutic activities;Therapeutic exercise;Neuromuscular re-education;Patient/family education;Passive range of motion;Manual techniques;Dry needling    PT Next Visit Plan  cervical traction did for patient; cervical stabilization; joint mobilization; see if dry needling helped    PT Home Exercise Plan  progress as needed    Recommended Other Services  sent MD renewal note    Consulted and Agree with Plan of Care  Patient       Patient will benefit from skilled therapeutic intervention in order to improve the following deficits and impairments:  Decreased range of motion, Increased fascial restricitons, Pain, Decreased activity tolerance, Decreased mobility  Visit Diagnosis: Cervicalgia - Plan: PT plan of care cert/re-cert  Cramp and spasm - Plan: PT plan of care cert/re-cert     Problem List Patient Active Problem List   Diagnosis Date Noted  . CAD in native artery 12/04/2015  . Cardiomyopathy, ischemic 12/04/2015  . CAD (coronary artery disease), native coronary artery 12/04/2015  . History of pulmonary embolism 11/25/2015  . Lactic acidosis 11/25/2015  . AKI (acute kidney injury) ([place]) 11/25/2015  . Aspiration pneumonia ([place]) 11/25/2015  . Hyperglycemia 11/25/2015  . Normocytic anemia 11/25/2015  . Acute respiratory failure with hypoxia ([place])   .  Cardiogenic shock ([place])   . NSTEMI (non-ST elevated myocardial infarction) ([place])   . Cardiac arrest ([place]) 11/19/2015  . Angina pectoris ([place]) 08/28/2015  . Retinal detachment 05/07/2014  . OA (osteoarthritis) of knee 01/08/2014  . Preoperative respiratory examination 11/22/2013  . Pulmonary embolism ([place]) 04/18/2013  . Chronic back pain 04/18/2013  . Dyspnea on exertion 04/06/2013  . 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 Hypothyroidism   . Hypercholesterolemia     Earlie Counts, PT 04/15/17 8:49 AM   [facility] [address] [place], [place], [address] Phone: [phone]   Fax:  [phone]  Name: SHEREKA LAFORTUNE MRN: [id number] Date of Birth: January 13, 1951

## 2017-04-16 MED FILL — MORPHINE SULF ER 30 MG TAB: 30 | 30 days supply | Qty: 60 | Fill #0

## 2017-04-19 ENCOUNTER — Encounter: Payer: Self-pay | Admitting: Physical Therapy

## 2017-04-19 ENCOUNTER — Ambulatory Visit: Payer: 59 | Admitting: Physical Therapy

## 2017-04-19 DIAGNOSIS — M542 Cervicalgia: Secondary | ICD-10-CM

## 2017-04-19 DIAGNOSIS — R252 Cramp and spasm: Secondary | ICD-10-CM | POA: Diagnosis not present

## 2017-04-19 MED FILL — METOPROLOL TARTRATE 25 MG T: 25 | 90 days supply | Qty: 90 | Fill #2

## 2017-04-19 NOTE — Therapy (Signed)
[facility] [address] [place], [place], [address] Phone: [phone]   Fax:  [phone]  Physical Therapy Treatment  Patient Details  Name: Brittany Mason MRN: [id number] Date of Birth: 25-Feb-1951 Referring Provider: Dr. Latanya Maudlin   Encounter Date: 04/19/2017  PT End of Session - 04/19/17 0916    Visit Number  18    Date for PT Re-Evaluation  05/06/17    Authorization Type  UMR    PT Start Time  0846    PT Stop Time  0935    PT Time Calculation (min)  49 min    Activity Tolerance  Patient tolerated treatment well    Behavior During Therapy  [facility] for tasks assessed/performed       Past Medical History:  Diagnosis Date  . Acute pulmonary embolism ([place]) 06/20/2015  . Arthritis    osteoarthritis-knees. Chronic back pain  . Basal cell carcinoma of right shoulder    "burned off"  . Chronic SI joint pain   . DVT (deep venous thrombosis) ([place]) 04/2013   left lower leg, resulted in Pulmonary emboli on hormone therapy  . Dyspnea on exertion   . GERD (gastroesophageal reflux disease)   . Hypercholesterolemia   . Hypothyroidism   . Migraine    "< once/month" (12/04/2015)  . MVP (mitral valve prolapse)    asymptomatic  . Myocardial infarction ([place]) 11/19/2015   cardiac arrest  . PE (pulmonary embolism) Apr 18, 2013   tx. -using Xarelto now, no further lung problems, denies SOB on 01-02-14  . PONV (postoperative nausea and vomiting)   . RLS (restless legs syndrome)     Past Surgical History:  Procedure Laterality Date  . ANTERIOR CRUCIATE LIGAMENT REPAIR Left 1976; 1981   "open"  . JOINT REPLACEMENT    . TOTAL ABDOMINAL HYSTERECTOMY  1998    There were no vitals filed for this visit.  Subjective Assessment - 04/19/17 0851    Subjective  I still feel like I have the motion. I can turn my head further but I have pain at the end range.     Patient Stated Goals  improve movement especially with driving  and shopping; reduce pain    Currently in Pain?  No/denies    Multiple Pain Sites  No                      [facility] Adult PT Treatment/Exercise - 04/19/17 0001      Neck Exercises: Machines for Strengthening   UBE (Upper Arm Bike)  level 2 2 min forward/2 min. backward      Neck Exercises: Seated   Cervical Rotation  Right;Left;5 reps therapist holding patient shoulder still    Other Seated Exercise  cervical rotation to left with therapist doing SNAGS to C2 on right      Neck Exercises: Supine   Other Supine Exercise  neck on foam roll doing the MELT method to improve movement and increase tissue mobility; lay with thoracic on roll and extens spine;    Other Supine Exercise  supine laying on foam roll along spine:chinretraction with edge of roll at T1, head on roll alternate shoulder flexion, bil. shoulder flexion, diagonals holding green plyoball      Modalities   Modalities  Traction      Traction   Type of Traction  Cervical    Min (lbs)  5    Max (lbs)  22    Time  15 min               PT Short Term Goals - 03/15/17 0809      PT SHORT TERM GOAL #3   Title  ability to turn head with driving with pain decreased >/= 25% due to improved mobility    Time  4    Period  Weeks    Status  Achieved        PT Long Term Goals - 04/19/17 0915      PT LONG TERM GOAL #1   Title  independent with HEP    Baseline  still learning    Time  8    Period  Weeks    Status  On-going      PT LONG TERM GOAL #2   Title  ability to turn her head without difficulty while driving due to improved  cervical ROM    Baseline  full right rotation, left is limited by 10%    Time  8    Period  Weeks    Status  On-going      PT LONG TERM GOAL #5   Title  FOTO score </= 39% limitation    Time  8    Period  Weeks    Status  New            Plan - 04/19/17 0916    Clinical Impression Statement  Patient reports turning her head to the left is 80% better.  Patient  has pain at endrange of left rotation.  Patient likes to have the traction at the end of treatment helps with the pain.  Patient will benefit from skilled therpay to address limitation in left cervical ROM and neuromuscular control.     Rehab Potential  Excellent    Clinical Impairments Affecting Rehab Potential  hypothyroidism; cardiac history    PT Frequency  2x / week    PT Duration  3 weeks    PT Treatment/Interventions  Cryotherapy;Electrical Stimulation;Moist Heat;Traction;Ultrasound;Therapeutic activities;Therapeutic exercise;Neuromuscular re-education;Patient/family education;Passive range of motion;Manual techniques;Dry needling    PT Next Visit Plan  cervical traction did for patient; cervical stabilization; joint mobilization; see if dry needling helped    PT Home Exercise Plan  progress as needed    Consulted and Agree with Plan of Care  Patient       Patient will benefit from skilled therapeutic intervention in order to improve the following deficits and impairments:  Decreased range of motion, Increased fascial restricitons, Pain, Decreased activity tolerance, Decreased mobility  Visit Diagnosis: Cervicalgia  Cramp and spasm     Problem List Patient Active Problem List   Diagnosis Date Noted  . CAD in native artery 12/04/2015  . Cardiomyopathy, ischemic 12/04/2015  . CAD (coronary artery disease), native coronary artery 12/04/2015  . History of pulmonary embolism 11/25/2015  . Lactic acidosis 11/25/2015  . AKI (acute kidney injury) ([place]) 11/25/2015  . Aspiration pneumonia ([place]) 11/25/2015  . Hyperglycemia 11/25/2015  . Normocytic anemia 11/25/2015  . Acute respiratory failure with hypoxia ([place])   . Cardiogenic shock ([place])   . NSTEMI (non-ST elevated myocardial infarction) ([place])   . Cardiac arrest ([place]) 11/19/2015  . Angina pectoris ([place]) 08/28/2015  . Retinal detachment 05/07/2014  . OA (osteoarthritis) of knee 01/08/2014  . Preoperative respiratory examination  11/22/2013  . Pulmonary embolism ([place]) 04/18/2013  . Chronic back pain 04/18/2013  . Dyspnea on exertion 04/06/2013  . 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 Hypothyroidism   . Hypercholesterolemia  Earlie Counts, PT 04/19/17 9:20 AM   [facility] [address] [place], [place], [address] Phone: [phone]   Fax:  [phone]  Name: WING GFELLER MRN: [id number] Date of Birth: 02-11-1951

## 2017-04-22 ENCOUNTER — Encounter: Payer: Self-pay | Admitting: Physical Therapy

## 2017-04-22 ENCOUNTER — Ambulatory Visit: Payer: 59 | Admitting: Physical Therapy

## 2017-04-22 DIAGNOSIS — R252 Cramp and spasm: Secondary | ICD-10-CM | POA: Diagnosis not present

## 2017-04-22 DIAGNOSIS — M542 Cervicalgia: Secondary | ICD-10-CM | POA: Diagnosis not present

## 2017-04-22 NOTE — Therapy (Signed)
[facility] [address] [place], [place], [address] Phone: [phone]   Fax:  [phone]  Physical Therapy Treatment  Patient Details  Name: Brittany Mason MRN: [id number] Date of Birth: 09-Aug-1950 Referring Provider: Dr. Latanya Maudlin   Encounter Date: 04/22/2017  PT End of Session - 04/22/17 0843    Visit Number  19    Date for PT Re-Evaluation  05/06/17    Authorization Type  UMR    PT Start Time  0800    PT Stop Time  0843    PT Time Calculation (min)  43 min    Activity Tolerance  Patient tolerated treatment well    Behavior During Therapy  [facility] for tasks assessed/performed       Past Medical History:  Diagnosis Date  . Acute pulmonary embolism ([place]) 06/20/2015  . Arthritis    osteoarthritis-knees. Chronic back pain  . Basal cell carcinoma of right shoulder    "burned off"  . Chronic SI joint pain   . DVT (deep venous thrombosis) ([place]) 04/2013   left lower leg, resulted in Pulmonary emboli on hormone therapy  . Dyspnea on exertion   . GERD (gastroesophageal reflux disease)   . Hypercholesterolemia   . Hypothyroidism   . Migraine    "< once/month" (12/04/2015)  . MVP (mitral valve prolapse)    asymptomatic  . Myocardial infarction ([place]) 11/19/2015   cardiac arrest  . PE (pulmonary embolism) Apr 18, 2013   tx. -using Xarelto now, no further lung problems, denies SOB on 01-02-14  . PONV (postoperative nausea and vomiting)   . RLS (restless legs syndrome)     Past Surgical History:  Procedure Laterality Date  . ANTERIOR CRUCIATE LIGAMENT REPAIR Left 1976; 1981   "open"  . CARDIAC CATHETERIZATION N/A 11/20/2015   Procedure: Left Heart Cath and Coronary Angiography;  Surgeon: Belva Crome, MD;  Location: [facility];  Service: Cardiovascular;  Laterality: N/A;  . CARDIAC CATHETERIZATION N/A 12/04/2015   Procedure: Left Heart Cath and Coronary Angiography;  Surgeon: Peter M Martinique, MD;   Location: [facility];  Service: Cardiovascular;  Laterality: N/A;  . CARDIAC CATHETERIZATION  12/04/2015   Procedure: Coronary Stent Intervention;  Surgeon: Peter M Martinique, MD;  Location: [facility];  Service: Cardiovascular;;  . COLONOSCOPY  09/28/2011   Procedure: COLONOSCOPY;  Surgeon: Juanita Craver, MD;  Location: [facility];  Service: Endoscopy;  Laterality: N/A;  . COLONOSCOPY WITH PROPOFOL N/A 01/05/2017   Procedure: COLONOSCOPY WITH PROPOFOL;  Surgeon: Juanita Craver, MD;  Location: [facility];  Service: Endoscopy;  Laterality: N/A;  . JOINT REPLACEMENT    . LEFT HEART CATH AND CORONARY ANGIOGRAPHY N/A 10/08/2016   Procedure: Left Heart Cath and Coronary Angiography;  Surgeon: Peter M Martinique, MD;  Location: [facility];  Service: Cardiovascular;  Laterality: N/A;  . TOTAL ABDOMINAL HYSTERECTOMY  1998  . TOTAL KNEE ARTHROPLASTY Left 01/08/2014   Procedure: LEFT TOTAL KNEE ARTHROPLASTY;  Surgeon: Gearlean Alf, MD;  Location: [facility];  Service: Orthopedics;  Laterality: Left;    There were no vitals filed for this visit.  Subjective Assessment - 04/22/17 0812    Subjective  I have a meeting after therapy. I am getting the motion.  I have had a headache for 3 days.     Patient Stated Goals  improve movement especially with driving and shopping; reduce pain    Currently in Pain?  Yes  Pain Score  3     Pain Location  Neck    Pain Orientation  Upper;Lower    Pain Descriptors / Indicators  Aching    Pain Type  Acute pain    Pain Onset  More than a month ago    Pain Frequency  Intermittent    Aggravating Factors   turning head to left    Pain Relieving Factors  not turning head    Multiple Pain Sites  No                      [facility] Adult PT Treatment/Exercise - 04/22/17 0001      Self-Care   Self-Care  Other Self-Care Comments    Other Self-Care Comments   discussed head postion in front of computer and how her glasses will affects her head  movement      Neck Exercises: Machines for Strengthening   UBE (Upper Arm Bike)  level 2 2 min forward/2 min. backward      Neck Exercises: Seated   Other Seated Exercise  chin retraction with flexion resistance hold 5 sec 5 times      Neck Exercises: Supine   Other Supine Exercise  neck on foam roll doing the MELT method to improve movement and increase tissue mobility; lay with thoracic on roll and extens spine;    Other Supine Exercise  supine laying on foam roll along spine:head on roll alternate shoulder flexion, bil. shoulder flexion,       Neck Exercises: Prone   Other Prone Exercise  prone on elbows  with chin retraction      Manual Therapy   Manual Therapy  Soft tissue mobilization;Joint mobilization    Joint Mobilization  left C3-C6 grade 3 upslide glide    Soft tissue mobilization  bilateral suboccipitals, cervical paraspinals, scalenes             PT Education - 04/22/17 0842    Education provided  Yes    Education Details  cervical retraction isometric for headaches but no picture available    Person(s) Educated  Patient    Methods  Explanation;Demonstration    Comprehension  Verbalized understanding;Returned demonstration       PT Short Term Goals - 03/15/17 0809      PT SHORT TERM GOAL #3   Title  ability to turn head with driving with pain decreased >/= 25% due to improved mobility    Time  4    Period  Weeks    Status  Achieved        PT Long Term Goals - 04/19/17 0915      PT LONG TERM GOAL #1   Title  independent with HEP    Baseline  still learning    Time  8    Period  Weeks    Status  On-going      PT LONG TERM GOAL #2   Title  ability to turn her head without difficulty while driving due to improved  cervical ROM    Baseline  full right rotation, left is limited by 10%    Time  8    Period  Weeks    Status  On-going      PT LONG TERM GOAL #5   Title  FOTO score </= 39% limitation    Time  8    Period  Weeks    Status  New  Plan - 04/22/17 0843    Clinical Impression Statement  Patient reports her headache decreased to 1/10.  Patient is going to look into a better prescription for her computer glasses so she does not have to look up and down so much.  Patient has had a headache for 3 days.  Patient continues to have tightness in left cervical C3-C6 area.  Patient will benefit from skilled therapy to address limitation in left cervcial ROM  and neural muscular control.     Rehab Potential  Excellent    Clinical Impairments Affecting Rehab Potential  hypothyroidism; cardiac history    PT Frequency  2x / week    PT Duration  3 weeks    PT Treatment/Interventions  Cryotherapy;Electrical Stimulation;Moist Heat;Traction;Ultrasound;Therapeutic activities;Therapeutic exercise;Neuromuscular re-education;Patient/family education;Passive range of motion;Manual techniques;Dry needling    PT Next Visit Plan   cervical stabilization; joint mobilization; cervical traction    PT Home Exercise Plan  progress as needed    Consulted and Agree with Plan of Care  Patient       Patient will benefit from skilled therapeutic intervention in order to improve the following deficits and impairments:  Decreased range of motion, Increased fascial restricitons, Pain, Decreased activity tolerance, Decreased mobility  Visit Diagnosis: Cervicalgia  Cramp and spasm     Problem List Patient Active Problem List   Diagnosis Date Noted  . CAD in native artery 12/04/2015  . Cardiomyopathy, ischemic 12/04/2015  . CAD (coronary artery disease), native coronary artery 12/04/2015  . History of pulmonary embolism 11/25/2015  . Lactic acidosis 11/25/2015  . AKI (acute kidney injury) ([place]) 11/25/2015  . Aspiration pneumonia ([place]) 11/25/2015  . Hyperglycemia 11/25/2015  . Normocytic anemia 11/25/2015  . Acute respiratory failure with hypoxia ([place])   . Cardiogenic shock ([place])   . NSTEMI (non-ST elevated myocardial infarction)  ([place])   . Cardiac arrest ([place]) 11/19/2015  . Angina pectoris ([place]) 08/28/2015  . Retinal detachment 05/07/2014  . OA (osteoarthritis) of knee 01/08/2014  . Preoperative respiratory examination 11/22/2013  . Pulmonary embolism ([place]) 04/18/2013  . Chronic back pain 04/18/2013  . Dyspnea on exertion 04/06/2013  . 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 Hypothyroidism   . Hypercholesterolemia     Earlie Counts, PT 04/22/17 8:48 AM   [facility] [address], [place], [place], [address] Phone: [phone]   Fax:  [phone]  Name: Brittany Mason MRN: [id number] Date of Birth: 02/23/1951

## 2017-04-26 ENCOUNTER — Ambulatory Visit: Payer: 59 | Admitting: Physical Therapy

## 2017-04-26 ENCOUNTER — Encounter: Payer: Self-pay | Admitting: Physical Therapy

## 2017-04-26 DIAGNOSIS — M542 Cervicalgia: Secondary | ICD-10-CM | POA: Diagnosis not present

## 2017-04-26 DIAGNOSIS — R252 Cramp and spasm: Secondary | ICD-10-CM | POA: Diagnosis not present

## 2017-04-26 MED FILL — SYNTHROID 137 MCG TABLET: 137 | 30 days supply | Qty: 30 | Fill #0

## 2017-04-26 NOTE — Therapy (Signed)
[facility] [address] [place], [place], [address] Phone: [phone]   Fax:  [phone]  Physical Therapy Treatment  Patient Details  Name: Brittany Mason MRN: [id number] Date of Birth: 1951-05-24 Referring Provider: Dr. Latanya Maudlin   Encounter Date: 04/26/2017  PT End of Session - 04/26/17 0840    Visit Number  20    Date for PT Re-Evaluation  05/06/17    Authorization Type  [facility]    PT Start Time  0805    PT Stop Time  0900    PT Time Calculation (min)  55 min    Activity Tolerance  Patient tolerated treatment well    Behavior During Therapy  [facility] for tasks assessed/performed       Past Medical History:  Diagnosis Date  . Acute pulmonary embolism ([place]) 06/20/2015  . Arthritis    osteoarthritis-knees. Chronic back pain  . Basal cell carcinoma of right shoulder    "burned off"  . Chronic SI joint pain   . DVT (deep venous thrombosis) ([place]) 04/2013   left lower leg, resulted in Pulmonary emboli on hormone therapy  . Dyspnea on exertion   . GERD (gastroesophageal reflux disease)   . Hypercholesterolemia   . Hypothyroidism   . Migraine    "< once/month" (12/04/2015)  . MVP (mitral valve prolapse)    asymptomatic  . Myocardial infarction ([place]) 11/19/2015   cardiac arrest  . PE (pulmonary embolism) Apr 18, 2013   tx. -using Xarelto now, no further lung problems, denies SOB on 01-02-14  . PONV (postoperative nausea and vomiting)   . RLS (restless legs syndrome)     Past Surgical History:  Procedure Laterality Date  . ANTERIOR CRUCIATE LIGAMENT REPAIR Left 1976; 1981   "open"  . COLONOSCOPY N/A 09/28/2011   Performed by Juanita Craver, MD at [facility]  . COLONOSCOPY WITH PROPOFOL N/A 01/05/2017   Performed by Juanita Craver, MD at [facility]  . Coronary Stent Intervention  12/04/2015   Performed by Martinique, Peter M, MD at [facility]  . INVASIVE LAB ABORTED/CANCEL CASE  11/19/2015   Performed  by Belva Crome, MD at [facility]  . JOINT REPLACEMENT    . Left Heart Cath and Coronary Angiography N/A 10/08/2016   Performed by Martinique, Peter M, MD at [facility]  . Left Heart Cath and Coronary Angiography N/A 12/04/2015   Performed by Martinique, Peter M, MD at [facility]  . Left Heart Cath and Coronary Angiography N/A 11/20/2015   Performed by Belva Crome, MD at [facility]  . LEFT TOTAL KNEE ARTHROPLASTY Left 01/08/2014   Performed by Gearlean Alf, MD at [facility]  . TOTAL ABDOMINAL HYSTERECTOMY  1998    There were no vitals filed for this visit.  Subjective Assessment - 04/26/17 0809    Subjective  My neck feels good, The new exercise has been helping my headaches.     Patient Stated Goals  improve movement especially with driving and shopping; reduce pain    Currently in Pain?  Yes    Pain Score  1     Pain Location  Neck    Pain Orientation  Upper;Lower    Pain Descriptors / Indicators  Aching    Pain Type  Acute pain    Pain Onset  More than a month ago    Pain Frequency  Intermittent  Aggravating Factors   turning head to left    Pain Relieving Factors  not turning head    Multiple Pain Sites  No         [facility] PT Assessment - 04/26/17 0001      Assessment   Medical Diagnosis  M54.2 Cervical pain    Referring Provider  Dr. Latanya Maudlin    Onset Date/Surgical Date  11/16/16    Prior Therapy  None      Precautions   Precautions  None      Restrictions   Weight Bearing Restrictions  No      Home Environment   Living Environment  Private residence      Prior Function   Level of Independence  Independent      Cognition   Overall Cognitive Status  Within Functional Limits for tasks assessed      Observation/Other Assessments   Focus on Therapeutic Outcomes (FOTO)   37% limitation      AROM   Cervical Flexion  full    Cervical Extension  55    Cervical - Right Side Bend  35    Cervical - Left Side Bend  35    Cervical -  Right Rotation  85    Cervical - Left Rotation  85      Transfers   Transfers  Not assessed      Ambulation/Gait   Ambulation/Gait  No                  [facility] Adult PT Treatment/Exercise - 04/26/17 0001      Neck Exercises: Machines for Strengthening   UBE (Upper Arm Bike)  level 2 2 min forward/2 min. backward      Neck Exercises: Supine   Other Supine Exercise  neck on foam roll doing the MELT method to improve movement and increase tissue mobility; lay with thoracic on roll and extens spine;    Other Supine Exercise  supine laying on foam roll along spine:head on roll alternate shoulder flexion, bil. shoulder flexion,       Modalities   Modalities  Traction      Traction   Type of Traction  Cervical    Min (lbs)  5    Max (lbs)  22    Time  15 min      Manual Therapy   Manual Therapy  Soft tissue mobilization;Joint mobilization    Joint Mobilization  left C3-C6 grade 3 upslide glide    Soft tissue mobilization  bilateral suboccipitals, cervical paraspinals, scalenes               PT Short Term Goals - 03/15/17 0809      PT SHORT TERM GOAL #3   Title  ability to turn head with driving with pain decreased >/= 25% due to improved mobility    Time  4    Period  Weeks    Status  Achieved        PT Long Term Goals - 04/26/17 0818      PT LONG TERM GOAL #1   Title  independent with HEP    Time  8    Period  Weeks    Status  Achieved      PT LONG TERM GOAL #2   Title  ability to turn her head without difficulty while driving due to improved  cervical ROM    Time  8    Period  Weeks  Status  Achieved      PT LONG TERM GOAL #3   Title  abilty to shop in grocery store with looking at the signs with minimal to no difficulty and no headache    Time  8    Period  Weeks    Status  Achieved      PT LONG TERM GOAL #4   Title  able to move head while working between 2 monitors with minimal to no difficulty due to improve cervical ROM    Time  8     Period  Weeks    Status  Achieved      PT LONG TERM GOAL #5   Title  FOTO score </= 39% limitation    Time  8    Period  Weeks    Status  Achieved            Plan - 04/26/17 0810    Clinical Impression Statement  Patient has increased rotation of cervical. She has pain with endrange of left cervical rotation.  Patient is able to turn her head to the left with driving without straining.  Patient has met her goals.  Patient is independent with her HEP.  Patient is ready for discharge.     Rehab Potential  Excellent    Clinical Impairments Affecting Rehab Potential  hypothyroidism; cardiac history    PT Treatment/Interventions  Cryotherapy;Electrical Stimulation;Moist Heat;Traction;Ultrasound;Therapeutic activities;Therapeutic exercise;Neuromuscular re-education;Patient/family education;Passive range of motion;Manual techniques;Dry needling    PT Next Visit Plan  Disharge to HEP this visit    PT Home Exercise Plan  Current HEP    Recommended Other Services  second request sent to sign renewal.     Consulted and Agree with Plan of Care  Patient       Patient will benefit from skilled therapeutic intervention in order to improve the following deficits and impairments:  Decreased range of motion, Increased fascial restricitons, Pain, Decreased activity tolerance, Decreased mobility  Visit Diagnosis: Cervicalgia  Cramp and spasm     Problem List Patient Active Problem List   Diagnosis Date Noted  . CAD in native artery 12/04/2015  . Cardiomyopathy, ischemic 12/04/2015  . CAD (coronary artery disease), native coronary artery 12/04/2015  . History of pulmonary embolism 11/25/2015  . Lactic acidosis 11/25/2015  . AKI (acute kidney injury) ([place]) 11/25/2015  . Aspiration pneumonia ([place]) 11/25/2015  . Hyperglycemia 11/25/2015  . Normocytic anemia 11/25/2015  . Acute respiratory failure with hypoxia ([place])   . Cardiogenic shock ([place])   . NSTEMI (non-ST elevated myocardial  infarction) ([place])   . Cardiac arrest ([place]) 11/19/2015  . Angina pectoris ([place]) 08/28/2015  . Retinal detachment 05/07/2014  . OA (osteoarthritis) of knee 01/08/2014  . Preoperative respiratory examination 11/22/2013  . Pulmonary embolism ([place]) 04/18/2013  . Chronic back pain 04/18/2013  . Dyspnea on exertion 04/06/2013  . 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 Hypothyroidism   . Hypercholesterolemia     Earlie Counts, PT 04/26/17 8:46 AM    [facility] [address], [place], [place], [address] Phone: [phone]   Fax:  [phone]  Name: Brittany Mason MRN: [id number] Date of Birth: [birth year] PHYSICAL THERAPY DISCHARGE SUMMARY  Visits from Start of Care: 20  Current functional level related to goals / functional outcomes: See above.    Remaining deficits: See above   Education / Equipment: HEP Plan: Patient agrees to discharge.  Patient goals were met. Patient is being discharged due to meeting  the stated rehab goals.  Thank you for the referral. Earlie Counts, PT 04/26/17 8:46 AM  ?????

## 2017-04-27 MED FILL — ESCITALOPRAM 20 MG TABLET: 20 | 90 days supply | Qty: 90 | Fill #0

## 2017-05-06 DIAGNOSIS — D225 Melanocytic nevi of trunk: Secondary | ICD-10-CM | POA: Diagnosis not present

## 2017-05-06 DIAGNOSIS — L821 Other seborrheic keratosis: Secondary | ICD-10-CM | POA: Diagnosis not present

## 2017-05-06 DIAGNOSIS — L570 Actinic keratosis: Secondary | ICD-10-CM | POA: Diagnosis not present

## 2017-05-06 DIAGNOSIS — L820 Inflamed seborrheic keratosis: Secondary | ICD-10-CM | POA: Diagnosis not present

## 2017-05-06 DIAGNOSIS — D239 Other benign neoplasm of skin, unspecified: Secondary | ICD-10-CM | POA: Diagnosis not present

## 2017-05-06 DIAGNOSIS — Z85828 Personal history of other malignant neoplasm of skin: Secondary | ICD-10-CM | POA: Diagnosis not present

## 2017-05-06 DIAGNOSIS — L814 Other melanin hyperpigmentation: Secondary | ICD-10-CM | POA: Diagnosis not present

## 2017-05-06 DIAGNOSIS — L853 Xerosis cutis: Secondary | ICD-10-CM | POA: Diagnosis not present

## 2017-05-07 MED FILL — CEPHALEXIN 500 MG CAPSULE: 500 | 6 days supply | Qty: 19 | Fill #0

## 2017-05-09 ENCOUNTER — Ambulatory Visit: Payer: Self-pay | Admitting: Family Medicine

## 2017-05-09 ENCOUNTER — Telehealth: Payer: 59 | Admitting: Family

## 2017-05-09 VITALS — BP 110/80 | HR 76 | Temp 98.7°F | Resp 18 | Wt 180.6 lb

## 2017-05-09 DIAGNOSIS — R0689 Other abnormalities of breathing: Secondary | ICD-10-CM

## 2017-05-09 DIAGNOSIS — R05 Cough: Secondary | ICD-10-CM

## 2017-05-09 DIAGNOSIS — R059 Cough, unspecified: Secondary | ICD-10-CM

## 2017-05-09 DIAGNOSIS — J019 Acute sinusitis, unspecified: Secondary | ICD-10-CM

## 2017-05-09 MED ORDER — AMOXICILLIN-POT CLAVULANATE 875-125 MG PO TABS: 1.0000 | ORAL_TABLET | Freq: Two times a day (BID) | ORAL | 0 refills | Status: DC

## 2017-05-09 MED ORDER — FLUCONAZOLE 150 MG PO TABS: 150.0000 mg | ORAL_TABLET | Freq: Once | ORAL | 0 refills | Status: AC

## 2017-05-09 MED ORDER — BENZONATATE 100 MG PO CAPS: 100.0000 mg | ORAL_CAPSULE | Freq: Three times a day (TID) | ORAL | 0 refills | Status: DC | PRN

## 2017-05-09 NOTE — Progress Notes (Signed)
Subjective:  Brittany Mason is a 66 y.o. female who presents for evaluation of persistent sinus congestion and a worsening productive cough.  Medical history is significant for CAD, and STEMI, hypothyroidism , and chronic dyspnea.  Brittany Mason reports a 6-7-day history of worsening cough, nasal congestion with occasional clear nasal drainage, and postnasal drip.  She reports associated soreness of throat and intermittent headache.  She is attempted relief with Mucinex and Coricidin in spite of over-the-counter therapy cough has progressively worsened.  She reports a yellowish white sputum production with cough.  She denies fever, abdominal pain, nausea vomiting, chest tightness, wheezing, or shortness of breath.   The following portions of the patient's history were reviewed and updated as appropriate:  allergies, current medications and past medical history.  Constitutional: positive for fatigue Eyes: negative Ears, nose, mouth, throat, and face: positive for nasal congestion and sore throat Respiratory: positive for cough and sputum Cardiovascular: negative Neurological: positive for headaches Behavioral/Psych: negative Objective:  Physical Exam  Constitutional: She is oriented to person, place, and time and well-developed, well-nourished, and in no distress. She has a sickly appearance.  HENT:  Head: Normocephalic and atraumatic.  Right Ear: Hearing, tympanic membrane, external ear and ear canal normal.  Left Ear: Hearing, tympanic membrane, external ear and ear canal normal.  Nose: Mucosal edema and rhinorrhea present.  Mouth/Throat: Uvula is midline and oropharynx is clear and moist.  Cardiovascular: Normal rate, regular rhythm, normal heart sounds and intact distal pulses.  Pulmonary/Chest: Effort normal. No respiratory distress. She has no wheezes.  Persistent, hacking cough  Musculoskeletal: Normal range of motion.  Neurological: She is alert and oriented to person, place, and time.    Skin: Skin is warm and dry.  Psychiatric: Mood, memory, affect and judgment normal.    Vitals:   05/09/17 1220  BP: 110/80  Pulse: 76  Resp: 18  Temp: 98.7 F (37.1 C)  SpO2: 95%   Immunization History  Administered Date(s) Administered  . Influenza Split 03/13/2013, 02/06/2014  . Influenza, Seasonal, Injecte, Preservative Fre 03/20/2016  . Influenza,inj,Quad PF,6+ Mos 03/09/2015  . Tdap 11/16/2013  . Zoster 08/06/2013    Assessment:  Sinusitis and Cough worsening over the course of 6-7 days. Cough is persistent and productive of colored sputum.  Concern for illness progressing to bronchitis and or pneumonia. Will cover patient today with broad-spectrum antibiotic and recommend close follow-up with PCP due to comorbid conditions and no prior immunizations against pneumonia and influenza.      Plan:   Meds ordered this encounter  Medications  . amoxicillin-clavulanate (AUGMENTIN) 875-125 MG tablet    Sig: Take 1 tablet by mouth 2 (two) times daily.    Dispense:  20 tablet    Refill:  0  . fluconazole (DIFLUCAN) 150 MG tablet    Sig: Take 1 tablet (150 mg total) by mouth once for 1 dose. Repeat if needed    Dispense:  1 tablet    Refill:  0  . benzonatate (TESSALON) 100 MG capsule    Sig: Take 1-2 capsules (100-200 mg total) by mouth 3 (three) times daily as needed for cough.    Dispense:  60 capsule    Refill:  0    If fever, chest tightness, or any worsening of symptoms occurs, please follow-up immediately with your PCP.   Carroll Sage. Kenton Kingfisher, MSN, FNP-C [address] [place]  [place], [place] [address] [phone]

## 2017-05-09 NOTE — Patient Instructions (Addendum)
You are being treated for sinusitis. Start Augmentin 1 tablet twice daily with food to avoid stomach upset. Complete all medication. I have prescribed Diflucan for you take if vaginal irritation develops. For cough, take benzonatate 100-200 mg, 3 times daily as needed for cough. If fever, chest tightness, or any worsening of symptoms occurs, please follow-up immediately with your PCP.     Cough, Adult A cough helps to clear your throat and lungs. A cough may last only 2-3 weeks (acute), or it may last longer than 8 weeks (chronic). Many different things can cause a cough. A cough may be a sign of an illness or another medical condition. Follow these instructions at home:  Pay attention to any changes in your cough.  Take medicines only as told by your doctor. ? If you were prescribed an antibiotic medicine, take it as told by your doctor. Do not stop taking it even if you start to feel better. ? Talk with your doctor before you try using a cough medicine.  Drink enough fluid to keep your pee (urine) clear or pale yellow.  If the air is dry, use a cold steam vaporizer or humidifier in your home.  Stay away from things that make you cough at work or at home.  If your cough is worse at night, try using extra pillows to raise your head up higher while you sleep.  Do not smoke, and try not to be around smoke. If you need help quitting, ask your doctor.  Do not have caffeine.  Do not drink alcohol.  Rest as needed. Contact a doctor if:  You have new problems (symptoms).  You cough up yellow fluid (pus).  Your cough does not get better after 2-3 weeks, or your cough gets worse.  Medicine does not help your cough and you are not sleeping well.  You have pain that gets worse or pain that is not helped with medicine.  You have a fever.  You are losing weight and you do not know why.  You have night sweats. Get help right away if:  You cough up blood.  You have trouble  breathing.  Your heartbeat is very fast. This information is not intended to replace advice given to you by your health care provider. Make sure you discuss any questions you have with your health care provider. Document Released: 02/05/2011 Document Revised: 10/31/2015 Document Reviewed: 08/01/2014 Elsevier Interactive Patient Education  2018 [employer].  Sinusitis, Adult Sinusitis is soreness and inflammation of your sinuses. Sinuses are hollow spaces in the bones around your face. They are located:  Around your eyes.  In the middle of your forehead.  Behind your nose.  In your cheekbones.  Your sinuses and nasal passages are lined with a stringy fluid (mucus). Mucus normally drains out of your sinuses. When your nasal tissues get inflamed or swollen, the mucus can get trapped or blocked so air cannot flow through your sinuses. This lets bacteria, viruses, and funguses grow, and that leads to infection. Follow these instructions at home: Medicines  Take, use, or apply over-the-counter and prescription medicines only as told by your doctor. These may include nasal sprays.  If you were prescribed an antibiotic medicine, take it as told by your doctor. Do not stop taking the antibiotic even if you start to feel better. Hydrate and Humidify  Drink enough water to keep your pee (urine) clear or pale yellow.  Use a cool mist humidifier to keep the humidity level in  your home above 50%.  Breathe in steam for 10-15 minutes, 3-4 times a day or as told by your doctor. You can do this in the bathroom while a hot shower is running.  Try not to spend time in cool or dry air. Rest  Rest as much as possible.  Sleep with your head raised (elevated).  Make sure to get enough sleep each night. General instructions  Put a warm, moist washcloth on your face 3-4 times a day or as told by your doctor. This will help with discomfort.  Wash your hands often with soap and water. If there is  no soap and water, use hand sanitizer.  Do not smoke. Avoid being around people who are smoking (secondhand smoke).  Keep all follow-up visits as told by your doctor. This is important. Contact a doctor if:  You have a fever.  Your symptoms get worse.  Your symptoms do not get better within 10 days. Get help right away if:  You have a very bad headache.  You cannot stop throwing up (vomiting).  You have pain or swelling around your face or eyes.  You have trouble seeing.  You feel confused.  Your neck is stiff.  You have trouble breathing. This information is not intended to replace advice given to you by your health care provider. Make sure you discuss any questions you have with your health care provider. Document Released: 11/11/2007 Document Revised: 01/19/2016 Document Reviewed: 03/20/2015 Elsevier Interactive Patient Education  [employer].

## 2017-05-09 NOTE — Progress Notes (Signed)
Based on what you shared with me it looks like you have a serious condition that should be evaluated in a face to face office visit.  NOTE: Even if you have entered your credit card information for this eVisit, you will not be charged.   If you are having a true medical emergency please call 911.  If you need an urgent face to face visit, [facility] has four urgent care centers for your convenience.  If you need care fast and have a high deductible or no insurance consider:   [email or web address]  [phone]  [address], [address] [place], [place] [address] 8 am to 8 pm Monday-Friday 10 am to 4 pm Saturday-Sunday   The following sites will take your  insurance:    . [facility]  [phone] Get Driving Directions Find a Provider at this Location  [address] [place], [place] [address] . 10 am to 8 pm Monday-Friday . 12 pm to 8 pm Saturday-Sunday   . [facility]  [phone] Get Driving Directions Find a Provider at this Location  [address], [address] [place], [place] [address] . 8 am to 8 pm Monday-Friday . 9 am to 6 pm Saturday . 11 am to 6 pm Sunday   . [facility] [place]  [phone] Get Driving Directions  [address] [place],  [address] . 8 am to 8 pm Monday-Friday . 8 am to 4 pm Saturday-Sunday   Your e-visit answers were reviewed by a board certified advanced clinical practitioner to complete your personal care plan.  Thank you for using e-Visits.  

## 2017-05-11 ENCOUNTER — Telehealth: Payer: Self-pay

## 2017-05-12 NOTE — Telephone Encounter (Signed)
Patient states she feels better and the cough is almost gone.

## 2017-05-24 DIAGNOSIS — J069 Acute upper respiratory infection, unspecified: Secondary | ICD-10-CM | POA: Diagnosis not present

## 2017-05-24 MED FILL — predniSONE 20 MG TABS: 20 | 3 days supply | Qty: 3 | Fill #0

## 2017-05-24 MED FILL — BENZONATATE 100 MG CAP: 100 | 10 days supply | Qty: 30 | Fill #0

## 2017-05-25 MED FILL — EZETIMIBE 10 MG TABS: 10 | 90 days supply | Qty: 90 | Fill #2

## 2017-06-04 MED FILL — CLOPIDOGREL 75 MG TABLET: 75 | 90 days supply | Qty: 90 | Fill #2

## 2017-06-04 MED FILL — ISOSORBIDE MN ER 30 MG TAB: 30 | 90 days supply | Qty: 90 | Fill #3

## 2017-06-10 DIAGNOSIS — M79662 Pain in left lower leg: Secondary | ICD-10-CM | POA: Diagnosis not present

## 2017-06-10 DIAGNOSIS — Z23 Encounter for immunization: Secondary | ICD-10-CM | POA: Diagnosis not present

## 2017-06-10 DIAGNOSIS — E039 Hypothyroidism, unspecified: Secondary | ICD-10-CM | POA: Diagnosis not present

## 2017-06-22 DIAGNOSIS — L720 Epidermal cyst: Secondary | ICD-10-CM | POA: Diagnosis not present

## 2017-06-22 DIAGNOSIS — L7211 Pilar cyst: Secondary | ICD-10-CM | POA: Diagnosis not present

## 2017-06-22 DIAGNOSIS — Z85828 Personal history of other malignant neoplasm of skin: Secondary | ICD-10-CM | POA: Diagnosis not present

## 2017-06-23 DIAGNOSIS — G2581 Restless legs syndrome: Secondary | ICD-10-CM | POA: Diagnosis not present

## 2017-06-23 DIAGNOSIS — E039 Hypothyroidism, unspecified: Secondary | ICD-10-CM | POA: Diagnosis not present

## 2017-06-23 DIAGNOSIS — E782 Mixed hyperlipidemia: Secondary | ICD-10-CM | POA: Diagnosis not present

## 2017-06-23 DIAGNOSIS — I252 Old myocardial infarction: Secondary | ICD-10-CM | POA: Diagnosis not present

## 2017-06-23 DIAGNOSIS — Z0000 Encounter for general adult medical examination without abnormal findings: Secondary | ICD-10-CM | POA: Diagnosis not present

## 2017-06-23 DIAGNOSIS — M545 Low back pain: Secondary | ICD-10-CM | POA: Diagnosis not present

## 2017-06-23 DIAGNOSIS — F338 Other recurrent depressive disorders: Secondary | ICD-10-CM | POA: Diagnosis not present

## 2017-06-23 MED FILL — SYNTHROID 137 MCG TABLET: 137 | 90 days supply | Qty: 90 | Fill #0

## 2017-06-28 MED FILL — MORPHINE SULF ER 30 MG TAB: 30 | 30 days supply | Qty: 60 | Fill #0

## 2017-06-28 MED FILL — rOPINIRole HCL 0.5 MG TABS: 0.5 | 90 days supply | Qty: 90 | Fill #2

## 2017-07-07 MED FILL — FENOFIBRATE 160 MG TABLET: 160 | 90 days supply | Qty: 90 | Fill #1

## 2017-07-07 MED FILL — SIMVASTATIN 40 MG TABLET: 40 | 90 days supply | Qty: 90 | Fill #2

## 2017-07-07 MED FILL — OMEGA-3 ETHYL ESTERS 1 GM C: 1 | 90 days supply | Qty: 360 | Fill #2

## 2017-07-19 MED FILL — AMOXICILLIN 500 MG CAPSULE: 500 | 3 days supply | Qty: 12 | Fill #0

## 2017-07-22 MED FILL — ESCITALOPRAM 20 MG TABLET: 20 | 90 days supply | Qty: 90 | Fill #0

## 2017-07-28 MED FILL — METOPROLOL TARTRATE 25 MG T: 25 | 90 days supply | Qty: 90 | Fill #3

## 2017-08-20 ENCOUNTER — Other Ambulatory Visit: Payer: Self-pay | Admitting: Cardiovascular Disease

## 2017-08-20 MED FILL — MORPHINE SULF ER 30 MG TAB: 30 | 30 days supply | Qty: 60 | Fill #0

## 2017-08-20 MED FILL — EZETIMIBE 10 MG TABS: 10 | 90 days supply | Qty: 90 | Fill #0

## 2017-08-23 ENCOUNTER — Other Ambulatory Visit: Payer: Self-pay | Admitting: Cardiovascular Disease

## 2017-08-23 MED ORDER — CLOPIDOGREL BISULFATE 75 MG PO TABS: 75.0000 mg | ORAL_TABLET | Freq: Every day | ORAL | 1 refills | Status: DC

## 2017-09-06 MED FILL — ISOSORBIDE MN ER 30 MG TAB: 30 | 90 days supply | Qty: 90 | Fill #0

## 2017-09-06 MED FILL — CLOPIDOGREL 75 MG TABLET: 75 | 90 days supply | Qty: 90 | Fill #0

## 2017-09-24 ENCOUNTER — Other Ambulatory Visit: Payer: Self-pay | Admitting: Cardiovascular Disease

## 2017-09-24 MED FILL — SIMVASTATIN 40 MG TABLET: 40 | 90 days supply | Qty: 90 | Fill #0

## 2017-09-27 MED FILL — FENOFIBRATE 160 MG TABLET: 160 | 90 days supply | Qty: 90 | Fill #0

## 2017-09-30 MED FILL — rOPINIRole HCL 0.5 MG TABS: 0.5 | 90 days supply | Qty: 90 | Fill #0

## 2017-09-30 MED FILL — SYNTHROID 137 MCG TABLET: 137 | 90 days supply | Qty: 90 | Fill #1

## 2017-10-06 MED FILL — SHINGRIX 50 MCG SUS: 50 | 1 days supply | Qty: 1 | Fill #0

## 2017-10-14 MED FILL — MORPHINE SULF ER 30 MG TAB: 30 | 30 days supply | Qty: 60 | Fill #0

## 2017-10-14 MED FILL — OMEGA-3 ETHYL ESTERS 1 GM C: 1 | 90 days supply | Qty: 360 | Fill #3

## 2017-10-19 ENCOUNTER — Other Ambulatory Visit: Payer: Self-pay | Admitting: Cardiovascular Disease

## 2017-10-20 MED FILL — ESCITALOPRAM 20 MG TABLET: 20 | 90 days supply | Qty: 90 | Fill #0

## 2017-10-20 MED FILL — METOPROLOL TARTRATE 25 MG T: 25 | 90 days supply | Qty: 90 | Fill #0

## 2017-11-11 MED FILL — EZETIMIBE 10 MG TABS: 10 | 90 days supply | Qty: 90 | Fill #1

## 2017-11-17 DIAGNOSIS — Z1231 Encounter for screening mammogram for malignant neoplasm of breast: Secondary | ICD-10-CM | POA: Diagnosis not present

## 2017-12-06 MED FILL — ISOSORBIDE MN ER 30 MG TAB: 30 | 90 days supply | Qty: 90 | Fill #1

## 2017-12-06 MED FILL — CLOPIDOGREL 75 MG TABLET: 75 | 90 days supply | Qty: 90 | Fill #1

## 2017-12-08 MED FILL — MORPHINE SULF ER 30 MG TAB: 30 | 30 days supply | Qty: 60 | Fill #0

## 2017-12-22 DIAGNOSIS — G894 Chronic pain syndrome: Secondary | ICD-10-CM | POA: Diagnosis not present

## 2017-12-22 DIAGNOSIS — H5203 Hypermetropia, bilateral: Secondary | ICD-10-CM | POA: Diagnosis not present

## 2017-12-22 DIAGNOSIS — H52223 Regular astigmatism, bilateral: Secondary | ICD-10-CM | POA: Diagnosis not present

## 2017-12-22 DIAGNOSIS — H35372 Puckering of macula, left eye: Secondary | ICD-10-CM | POA: Diagnosis not present

## 2017-12-22 DIAGNOSIS — M545 Low back pain: Secondary | ICD-10-CM | POA: Diagnosis not present

## 2017-12-22 DIAGNOSIS — H43822 Vitreomacular adhesion, left eye: Secondary | ICD-10-CM | POA: Diagnosis not present

## 2017-12-22 DIAGNOSIS — Z8669 Personal history of other diseases of the nervous system and sense organs: Secondary | ICD-10-CM | POA: Diagnosis not present

## 2017-12-22 DIAGNOSIS — Z79899 Other long term (current) drug therapy: Secondary | ICD-10-CM | POA: Diagnosis not present

## 2017-12-22 DIAGNOSIS — H2513 Age-related nuclear cataract, bilateral: Secondary | ICD-10-CM | POA: Diagnosis not present

## 2017-12-22 MED FILL — SYNTHROID 137 MCG TABLET: 137 | 90 days supply | Qty: 90 | Fill #2

## 2017-12-22 MED FILL — rOPINIRole HCL 0.5 MG TABS: 0.5 | 90 days supply | Qty: 90 | Fill #1

## 2017-12-27 MED FILL — SIMVASTATIN 40 MG TABS: 40 | 90 days supply | Qty: 90 | Fill #1

## 2017-12-27 MED FILL — FENOFIBRATE 160 MG TABLET: 160 | 90 days supply | Qty: 90 | Fill #1

## 2018-01-27 ENCOUNTER — Other Ambulatory Visit: Payer: Self-pay | Admitting: Cardiovascular Disease

## 2018-01-27 MED FILL — ESCITALOPRAM 20 MG TABLET: 20 | 90 days supply | Qty: 90 | Fill #0

## 2018-01-27 MED FILL — METOPROLOL TARTRATE 25 MG T: 25 | 90 days supply | Qty: 90 | Fill #0

## 2018-01-28 MED FILL — OMEGA-3 ETHYL ESTERS 1 GM C: 1 | 90 days supply | Qty: 360 | Fill #0

## 2018-01-31 NOTE — Progress Notes (Signed)
CARDIOLOGY OFFICE NOTE  Date:  02/08/2018    Brittany Mason Date of Birth: [birth year] Medical Record #[id number]  PCP:  Hulan Fess, MD  Cardiologist:  Gillian Shields    No chief complaint on file.   History of Present Illness: Brittany Mason is a 67 y.o. female who presents today for a follow up visit.  Previous  Patient of Dr Gwenlyn Found History of PE on HRT, low thyroid, and CAD. Had VF arrest with cath 12/04/15 99% proximal circumflex stenosis Rx DES Dr Martinique. Stented into large OM1 persistent distal LCx occlusion with right to left collaterals. Recurrent chest pain and repeat cath 10/08/16 no change Unable to cross distal circumflex with wire good right to left collaterals and patent stent Medical RX  Unable to tolerate lipitor and crestor. Zetia added to zocor tr try to achieve LDL goal History of SEM but TTE 2017 only trivial AR      Still working at [employer]       Having some sharp chest pain radiating to right side of neck pain is relieved with nitro usually   Only needs one. Has had for a few months No rest pain Already on long acting nitrates    Past Medical History:  Diagnosis Date  . Acute pulmonary embolism ([place]) 06/20/2015  . Arthritis    osteoarthritis-knees. Chronic back pain  . Basal cell carcinoma of right shoulder    "burned off"  . Chronic SI joint pain   . DVT (deep venous thrombosis) ([place]) 04/2013   left lower leg, resulted in Pulmonary emboli on hormone therapy  . Dyspnea on exertion   . GERD (gastroesophageal reflux disease)   . Hypercholesterolemia   . Hypothyroidism   . Migraine    "< once/month" (12/04/2015)  . MVP (mitral valve prolapse)    asymptomatic  . Myocardial infarction ([place]) 11/19/2015   cardiac arrest  . PE (pulmonary embolism) Apr 18, 2013   tx. -using Xarelto now, no further lung problems, denies SOB on 01-02-14  . PONV (postoperative nausea and vomiting)   . RLS (restless legs syndrome)     Past Surgical History:    Procedure Laterality Date  . ANTERIOR CRUCIATE LIGAMENT REPAIR Left 1976; 1981   "open"  . CARDIAC CATHETERIZATION N/A 11/20/2015   Procedure: Left Heart Cath and Coronary Angiography;  Surgeon: Belva Crome, MD;  Location: [facility];  Service: Cardiovascular;  Laterality: N/A;  . CARDIAC CATHETERIZATION N/A 12/04/2015   Procedure: Left Heart Cath and Coronary Angiography;  Surgeon: Peter M Martinique, MD;  Location: [facility];  Service: Cardiovascular;  Laterality: N/A;  . CARDIAC CATHETERIZATION  12/04/2015   Procedure: Coronary Stent Intervention;  Surgeon: Peter M Martinique, MD;  Location: [facility];  Service: Cardiovascular;;  . COLONOSCOPY  09/28/2011   Procedure: COLONOSCOPY;  Surgeon: Juanita Craver, MD;  Location: [facility];  Service: Endoscopy;  Laterality: N/A;  . COLONOSCOPY WITH PROPOFOL N/A 01/05/2017   Procedure: COLONOSCOPY WITH PROPOFOL;  Surgeon: Juanita Craver, MD;  Location: [facility];  Service: Endoscopy;  Laterality: N/A;  . JOINT REPLACEMENT    . LEFT HEART CATH AND CORONARY ANGIOGRAPHY N/A 10/08/2016   Procedure: Left Heart Cath and Coronary Angiography;  Surgeon: Peter M Martinique, MD;  Location: [facility];  Service: Cardiovascular;  Laterality: N/A;  . TOTAL ABDOMINAL HYSTERECTOMY  1998  . TOTAL KNEE ARTHROPLASTY Left 01/08/2014   Procedure: LEFT TOTAL KNEE ARTHROPLASTY;  Surgeon:  Gearlean Alf, MD;  Location: [facility];  Service: Orthopedics;  Laterality: Left;     Medications: Current Outpatient Medications  Medication Sig Dispense Refill  . amoxicillin-clavulanate (AUGMENTIN) 875-125 MG tablet Take 1 tablet by mouth 2 (two) times daily. 20 tablet 0  . benzonatate (TESSALON) 100 MG capsule Take 1-2 capsules (100-200 mg total) by mouth 3 (three) times daily as needed for cough. 60 capsule 0  . butalbital-acetaminophen-caffeine (FIORICET, ESGIC) 50-325-40 MG tablet Take 1 tablet by mouth 2 (two) times daily as needed for headache.    .  clopidogrel (PLAVIX) 75 MG tablet Take 1 tablet (75 mg total) by mouth daily. 90 tablet 1  . cyclobenzaprine (FLEXERIL) 5 MG tablet Take 5 mg by mouth 3 (three) times daily as needed for muscle spasms.    Marland Kitchen escitalopram (LEXAPRO) 20 MG tablet Take 20 mg by mouth every morning.     . ezetimibe (ZETIA) 10 MG tablet Take 1 tablet (10 mg total) by mouth daily. Please keep upcoming appt in September with Dr. Johnsie Cancel for future refills. Thank you 90 tablet 0  . fenofibrate 160 MG tablet Take 160 mg by mouth at bedtime.     . isosorbide mononitrate (IMDUR) 30 MG 24 hr tablet TAKE 1 TABLET BY MOUTH ONCE DAILY 90 tablet 1  . levothyroxine (SYNTHROID, LEVOTHROID) 150 MCG tablet Take 150 mcg by mouth daily before breakfast.     . linaclotide (LINZESS) 290 MCG CAPS capsule Take 290 mcg by mouth every evening. Taking for 8 days prior to procedure    . metoprolol tartrate (LOPRESSOR) 25 MG tablet TAKE 1/2 TABLET BY MOUTH 2 TIMES DAILY. 90 tablet 0  . morphine (MSIR) 30 MG tablet Take 30 mg by mouth 2 (two) times daily as needed for moderate pain or severe pain (takes 1 tablet every morning and additonal dose onl;y if needed).     . nitroGLYCERIN (NITROSTAT) 0.4 MG SL tablet Place 1 tablet (0.4 mg total) under the tongue every 5 (five) minutes as needed for chest pain. 25 tablet 3  . omega-3 acid ethyl esters (LOVAZA) 1 G capsule Take 2 g by mouth 2 (two) times daily.    Marland Kitchen OVER THE COUNTER MEDICATION Take 2 capsules by mouth daily at 6 PM. "Relizen" herbal supplement for hormone replacement     . Polyethyl Glycol-Propyl Glycol (SYSTANE OP) Place 1-2 drops into both eyes daily as needed (red/itchy eyes).    Marland Kitchen rOPINIRole (REQUIP) 0.5 MG tablet Take 0.25 mg by mouth See admin instructions. Takes every night between 8-9 pm , takes a 1/2 tablet every night but sometimes takes 0.5 tablet if having major symptoms of restless legs    . scopolamine (TRANSDERM-SCOP) 1 MG/3DAYS Place 1 patch onto the skin as needed (for  travel).    . simvastatin (ZOCOR) 40 MG tablet Take 1 tablet (40 mg total) by mouth at bedtime. Please make yearly appt with Dr. Johnsie Cancel for September for future refills. 1st attempt 90 tablet 1   No current facility-administered medications for this visit.     Allergies: Allergies  Allergen Reactions  . Codeine Nausea And Vomiting    Takes hydrocodone; if takes doses too close together, states "violently throws up"  . Erythromycin Hives and Itching    Social History: The patient  reports that she has never smoked. She has never used smokeless tobacco. She reports that she does not drink alcohol or use drugs.   Family History: [name] family history includes Breast cancer  in her maternal aunt and paternal aunt; Cancer in her maternal aunt; Congestive Heart Failure in her mother; Coronary artery disease in her father; Hypertension in her father and mother; Stroke in her brother.   Review of Systems: Please see the history of present illness.   Otherwise, the review of systems is positive for none.   All other systems are reviewed and negative.   Physical Exam: VS:  BP 106/64   Pulse (!) 56   Ht 5\' 3"  (1.6 m)   Wt 180 lb (81.6 kg)   BMI 31.89 kg/m  .  BMI Body mass index is 31.89 kg/m.  Wt Readings from Last 3 Encounters:  02/08/18 180 lb (81.6 kg)  05/09/17 180 lb 9.6 oz (81.9 kg)  03/04/17 181 lb (82.1 kg)    Affect appropriate Healthy:  appears stated age HEENT: normal Neck supple with no adenopathy JVP normal no bruits no thyromegaly Lungs clear with no wheezing and good diaphragmatic motion Heart:  S1/S2 SEM  murmur, no rub, gallop or click PMI normal Abdomen: benighn, BS positve, no tenderness, no AAA no bruit.  No HSM or HJR Distal pulses intact with no bruits No edema Neuro non-focal Skin warm and dry No muscular weakness     LABORATORY DATA:  EKG:   SR PVC;s 10/08/16 02/08/18 SR rate 56 PR 210 PVC otherwise normal   Lab Results  Component Value  Date   WBC 5.9 10/05/2016   HGB 12.9 10/05/2016   HCT 39.3 10/05/2016   PLT 294 10/05/2016   GLUCOSE 98 10/05/2016   CHOL 138 11/18/2016   TRIG 79 11/18/2016   HDL 56 11/18/2016   LDLCALC 66 11/18/2016   ALT 22 09/08/2016   AST 33 09/08/2016   NA 137 10/05/2016   K 4.4 10/05/2016   CL 99 10/05/2016   CREATININE 0.65 10/05/2016   BUN 12 10/05/2016   CO2 23 10/05/2016   TSH 0.39 (L) 01/20/2016   INR 1.0 10/05/2016   HGBA1C 5.7 (H) 11/19/2015    BNP (last 3 results) No results for input(s): BNP in the last 8760 hours.  ProBNP (last 3 results) No results for input(s): PROBNP in the last 8760 hours.   Other Studies Reviewed Today:  Cardiac Cath/PCI Conclusion from 12/04/15     Prox RCA to Mid RCA lesion, 30% stenosed.  RPDA lesion, 60% stenosed.  Prox Cx to Mid Cx lesion, 100% stenosed.  There is mild left ventricular systolic dysfunction.  Prox Cx lesion, 99% stenosed. Post intervention, there is a 0% residual stenosis.  1. Single vessel obstructive CAD 2. Mild LV dysfunction. 3. Low LVEDP 4. Successful stenting of the proximal LCx into a large bifurcating OM1. The distal LCx is still occluded with right to left collaterals. This branch is relatively small.   Plan: DAPT for one year. Will stop Lasix. Given improvement in EF would not recommend Life Vest at this point. Anticipate DC in am.      Cardiac Catheterization: 11/20/2015  Prox RCA to Dist RCA lesion, 45% stenosed.  RPDA lesion, 65% stenosed.  Prox Cx to Mid Cx lesion, 100% stenosed.   Total occlusion (Acute vs Chronic) of the proximal circumflex with collaterals from left to left and right to left. The collaterals appear to be meager however, the patient is on the cooling protocol and I suspect that collateralization will significantly improved once the patient is back to body temperature. If angina develops post arrest, recanalization of the circumflex coronary artery would be  reasonable.  Diffuse moderate stenosis in a large PDA, up to 70% obstruction. Relatively small distribution LAD that reaches, but does not wrap around, the left ventricular apex. No significant obstruction is noted in the LAD.  Mid inferior wall moderate hypokinesis. Estimated ejection fraction 45-50%. Marked elevation in left ventricular end-diastolic pressure of 40 mmHg secondary to acute ischemia and cooling.   Recommendations:  Conservative medical management for the time being.  I would add Plavix to the patient's medical regimen.  Should the patient have a significant neurological recovery, angina/ongoing evidence of ischemia could be treated with circumflex PCI. We chose against PCI today given cooling, vasoconstriction related to cooling, and clinical stability at the present time without evidence of ongoing ischemia.   Echocardiogram: 11/20/2015 Study Conclusions  - Left ventricle: The cavity size was normal. There was mild focal  basal hypertrophy of the septum. Systolic function was moderately  reduced. The estimated ejection fraction was in the range of 35%  to 40%. Diffuse hypokinesis. There is akinesis of the  mid-apicalinferolateral and inferior myocardium. Doppler  parameters are consistent with abnormal left ventricular  relaxation (grade 1 diastolic dysfunction). - Aortic valve: Trileaflet; mildly thickened, mildly calcified  leaflets. There was mild regurgitation directed eccentrically in  the LVOT. - Aorta: Aortic root dimension: 38 mm (ED). - Ascending aorta: The ascending aorta was mildly dilated. - Mitral valve: Calcified annulus. - Right ventricle: The cavity size was moderately dilated. Wall  thickness was normal. Systolic function was moderately reduced.  Assessment/Plan: 1. V-fib Cardiac Arrest/ NSTEMI - with LV dysfunction - Post DES to proximal circumflex into OM1 with June 2017  Total distal circumflex occlusion right to left  collaterals. Patent stent cath repeat 10/08/16 Recurrent angina Collaterals make stress testing less appealing In light of new onset angina will arrange cath this week With Dr Martinique continue beta blocker and nitrates Risks of cath including stroke, MI bleeding need for emergency surgery discussed willing to proceed Orders written lab Dr Burt Knack to do on Friday   2. Ischemic CM -   Seen by EP and no AICD indicated  EF 45-50% by echo 04/10/16  LV gram during cath   3. HLD - continue zocor and zetia LDL at goal   4. Elevated LFTs - normal now on statin 09/08/16   5. Short term memory issues - greatly improved.   6. PVCs - seen by Dr Curt Bears continue beta blocker   7. Murmur: benign SEM echo 04/2016 only trivial AR  See #2 TTE ordered ? AV sclerosis     Jenkins Rouge

## 2018-01-31 NOTE — H&P (View-Only) (Signed)
CARDIOLOGY OFFICE NOTE  Date:  02/08/2018    Brittany Mason Date of Birth: 09-Dec-1950 Medical Record #[id number]  PCP:  Hulan Fess, MD  Cardiologist:  Gillian Shields    No chief complaint on file.   History of Present Illness: Brittany Mason is a 67 y.o. female who presents today for a follow up visit.  Previous  Patient of Dr Gwenlyn Found History of PE on HRT, low thyroid, and CAD. Had VF arrest with cath 12/04/15 99% proximal circumflex stenosis Rx DES Dr Martinique. Stented into large OM1 persistent distal LCx occlusion with right to left collaterals. Recurrent chest pain and repeat cath 10/08/16 no change Unable to cross distal circumflex with wire good right to left collaterals and patent stent Medical RX  Unable to tolerate lipitor and crestor. Zetia added to zocor tr try to achieve LDL goal History of SEM but TTE 2017 only trivial AR      Still working at [employer]       Having some sharp chest pain radiating to right side of neck pain is relieved with nitro usually   Only needs one. Has had for a few months No rest pain Already on long acting nitrates    Past Medical History:  Diagnosis Date  . Acute pulmonary embolism ([place]) 06/20/2015  . Arthritis    osteoarthritis-knees. Chronic back pain  . Basal cell carcinoma of right shoulder    "burned off"  . Chronic SI joint pain   . DVT (deep venous thrombosis) ([place]) 04/2013   left lower leg, resulted in Pulmonary emboli on hormone therapy  . Dyspnea on exertion   . GERD (gastroesophageal reflux disease)   . Hypercholesterolemia   . Hypothyroidism   . Migraine    "< once/month" (12/04/2015)  . MVP (mitral valve prolapse)    asymptomatic  . Myocardial infarction ([place]) 11/19/2015   cardiac arrest  . PE (pulmonary embolism) Apr 18, 2013   tx. -using Xarelto now, no further lung problems, denies SOB on 01-02-14  . PONV (postoperative nausea and vomiting)   . RLS (restless legs syndrome)     Past Surgical History:    Procedure Laterality Date  . ANTERIOR CRUCIATE LIGAMENT REPAIR Left 1976; 1981   "open"  . CARDIAC CATHETERIZATION N/A 11/20/2015   Procedure: Left Heart Cath and Coronary Angiography;  Surgeon: Belva Crome, MD;  Location: [facility];  Service: Cardiovascular;  Laterality: N/A;  . CARDIAC CATHETERIZATION N/A 12/04/2015   Procedure: Left Heart Cath and Coronary Angiography;  Surgeon: Saory Carriero M Martinique, MD;  Location: [facility];  Service: Cardiovascular;  Laterality: N/A;  . CARDIAC CATHETERIZATION  12/04/2015   Procedure: Coronary Stent Intervention;  Surgeon: Harlyn Italiano M Martinique, MD;  Location: [facility];  Service: Cardiovascular;;  . COLONOSCOPY  09/28/2011   Procedure: COLONOSCOPY;  Surgeon: Juanita Craver, MD;  Location: [facility];  Service: Endoscopy;  Laterality: N/A;  . COLONOSCOPY WITH PROPOFOL N/A 01/05/2017   Procedure: COLONOSCOPY WITH PROPOFOL;  Surgeon: Juanita Craver, MD;  Location: [facility];  Service: Endoscopy;  Laterality: N/A;  . JOINT REPLACEMENT    . LEFT HEART CATH AND CORONARY ANGIOGRAPHY N/A 10/08/2016   Procedure: Left Heart Cath and Coronary Angiography;  Surgeon: Danijah Noh M Martinique, MD;  Location: [facility];  Service: Cardiovascular;  Laterality: N/A;  . TOTAL ABDOMINAL HYSTERECTOMY  1998  . TOTAL KNEE ARTHROPLASTY Left 01/08/2014   Procedure: LEFT TOTAL KNEE ARTHROPLASTY;  Surgeon:  Gearlean Alf, MD;  Location: [facility];  Service: Orthopedics;  Laterality: Left;     Medications: Current Outpatient Medications  Medication Sig Dispense Refill  . amoxicillin-clavulanate (AUGMENTIN) 875-125 MG tablet Take 1 tablet by mouth 2 (two) times daily. 20 tablet 0  . benzonatate (TESSALON) 100 MG capsule Take 1-2 capsules (100-200 mg total) by mouth 3 (three) times daily as needed for cough. 60 capsule 0  . butalbital-acetaminophen-caffeine (FIORICET, ESGIC) 50-325-40 MG tablet Take 1 tablet by mouth 2 (two) times daily as needed for headache.    .  clopidogrel (PLAVIX) 75 MG tablet Take 1 tablet (75 mg total) by mouth daily. 90 tablet 1  . cyclobenzaprine (FLEXERIL) 5 MG tablet Take 5 mg by mouth 3 (three) times daily as needed for muscle spasms.    Marland Kitchen escitalopram (LEXAPRO) 20 MG tablet Take 20 mg by mouth every morning.     . ezetimibe (ZETIA) 10 MG tablet Take 1 tablet (10 mg total) by mouth daily. Please keep upcoming appt in September with Dr. Johnsie Cancel for future refills. Thank you 90 tablet 0  . fenofibrate 160 MG tablet Take 160 mg by mouth at bedtime.     . isosorbide mononitrate (IMDUR) 30 MG 24 hr tablet TAKE 1 TABLET BY MOUTH ONCE DAILY 90 tablet 1  . levothyroxine (SYNTHROID, LEVOTHROID) 150 MCG tablet Take 150 mcg by mouth daily before breakfast.     . linaclotide (LINZESS) 290 MCG CAPS capsule Take 290 mcg by mouth every evening. Taking for 8 days prior to procedure    . metoprolol tartrate (LOPRESSOR) 25 MG tablet TAKE 1/2 TABLET BY MOUTH 2 TIMES DAILY. 90 tablet 0  . morphine (MSIR) 30 MG tablet Take 30 mg by mouth 2 (two) times daily as needed for moderate pain or severe pain (takes 1 tablet every morning and additonal dose onl;y if needed).     . nitroGLYCERIN (NITROSTAT) 0.4 MG SL tablet Place 1 tablet (0.4 mg total) under the tongue every 5 (five) minutes as needed for chest pain. 25 tablet 3  . omega-3 acid ethyl esters (LOVAZA) 1 G capsule Take 2 g by mouth 2 (two) times daily.    Marland Kitchen OVER THE COUNTER MEDICATION Take 2 capsules by mouth daily at 6 PM. "Relizen" herbal supplement for hormone replacement     . Polyethyl Glycol-Propyl Glycol (SYSTANE OP) Place 1-2 drops into both eyes daily as needed (red/itchy eyes).    Marland Kitchen rOPINIRole (REQUIP) 0.5 MG tablet Take 0.25 mg by mouth See admin instructions. Takes every night between 8-9 pm , takes a 1/2 tablet every night but sometimes takes 0.5 tablet if having major symptoms of restless legs    . scopolamine (TRANSDERM-SCOP) 1 MG/3DAYS Place 1 patch onto the skin as needed (for  travel).    . simvastatin (ZOCOR) 40 MG tablet Take 1 tablet (40 mg total) by mouth at bedtime. Please make yearly appt with Dr. Johnsie Cancel for September for future refills. 1st attempt 90 tablet 1   No current facility-administered medications for this visit.     Allergies: Allergies  Allergen Reactions  . Codeine Nausea And Vomiting    Takes hydrocodone; if takes doses too close together, states "violently throws up"  . Erythromycin Hives and Itching    Social History: The patient  reports that she has never smoked. She has never used smokeless tobacco. She reports that she does not drink alcohol or use drugs.   Family History: [name] family history includes Breast cancer  in her maternal aunt and paternal aunt; Cancer in her maternal aunt; Congestive Heart Failure in her mother; Coronary artery disease in her father; Hypertension in her father and mother; Stroke in her brother.   Review of Systems: Please see the history of present illness.   Otherwise, the review of systems is positive for none.   All other systems are reviewed and negative.   Physical Exam: VS:  BP 106/64   Pulse (!) 56   Ht 5\' 3"  (1.6 m)   Wt 180 lb (81.6 kg)   BMI 31.89 kg/m  .  BMI Body mass index is 31.89 kg/m.  Wt Readings from Last 3 Encounters:  02/08/18 180 lb (81.6 kg)  05/09/17 180 lb 9.6 oz (81.9 kg)  03/04/17 181 lb (82.1 kg)    Affect appropriate Healthy:  appears stated age HEENT: normal Neck supple with no adenopathy JVP normal no bruits no thyromegaly Lungs clear with no wheezing and good diaphragmatic motion Heart:  S1/S2 SEM  murmur, no rub, gallop or click PMI normal Abdomen: benighn, BS positve, no tenderness, no AAA no bruit.  No HSM or HJR Distal pulses intact with no bruits No edema Neuro non-focal Skin warm and dry No muscular weakness     LABORATORY DATA:  EKG:   SR PVC;s 10/08/16 02/08/18 SR rate 56 PR 210 PVC otherwise normal   Lab Results  Component Value  Date   WBC 5.9 10/05/2016   HGB 12.9 10/05/2016   HCT 39.3 10/05/2016   PLT 294 10/05/2016   GLUCOSE 98 10/05/2016   CHOL 138 11/18/2016   TRIG 79 11/18/2016   HDL 56 11/18/2016   LDLCALC 66 11/18/2016   ALT 22 09/08/2016   AST 33 09/08/2016   NA 137 10/05/2016   K 4.4 10/05/2016   CL 99 10/05/2016   CREATININE 0.65 10/05/2016   BUN 12 10/05/2016   CO2 23 10/05/2016   TSH 0.39 (L) 01/20/2016   INR 1.0 10/05/2016   HGBA1C 5.7 (H) 11/19/2015    BNP (last 3 results) No results for input(s): BNP in the last 8760 hours.  ProBNP (last 3 results) No results for input(s): PROBNP in the last 8760 hours.   Other Studies Reviewed Today:  Cardiac Cath/PCI Conclusion from 12/04/15     Prox RCA to Mid RCA lesion, 30% stenosed.  RPDA lesion, 60% stenosed.  Prox Cx to Mid Cx lesion, 100% stenosed.  There is mild left ventricular systolic dysfunction.  Prox Cx lesion, 99% stenosed. Post intervention, there is a 0% residual stenosis.  1. Single vessel obstructive CAD 2. Mild LV dysfunction. 3. Low LVEDP 4. Successful stenting of the proximal LCx into a large bifurcating OM1. The distal LCx is still occluded with right to left collaterals. This branch is relatively small.   Plan: DAPT for one year. Will stop Lasix. Given improvement in EF would not recommend Life Vest at this point. Anticipate DC in am.      Cardiac Catheterization: 11/20/2015  Prox RCA to Dist RCA lesion, 45% stenosed.  RPDA lesion, 65% stenosed.  Prox Cx to Mid Cx lesion, 100% stenosed.   Total occlusion (Acute vs Chronic) of the proximal circumflex with collaterals from left to left and right to left. The collaterals appear to be meager however, the patient is on the cooling protocol and I suspect that collateralization will significantly improved once the patient is back to body temperature. If angina develops post arrest, recanalization of the circumflex coronary artery would be  reasonable.  Diffuse moderate stenosis in a large PDA, up to 70% obstruction. Relatively small distribution LAD that reaches, but does not wrap around, the left ventricular apex. No significant obstruction is noted in the LAD.  Mid inferior wall moderate hypokinesis. Estimated ejection fraction 45-50%. Marked elevation in left ventricular end-diastolic pressure of 40 mmHg secondary to acute ischemia and cooling.   Recommendations:  Conservative medical management for the time being.  I would add Plavix to the patient's medical regimen.  Should the patient have a significant neurological recovery, angina/ongoing evidence of ischemia could be treated with circumflex PCI. We chose against PCI today given cooling, vasoconstriction related to cooling, and clinical stability at the present time without evidence of ongoing ischemia.   Echocardiogram: 11/20/2015 Study Conclusions  - Left ventricle: The cavity size was normal. There was mild focal  basal hypertrophy of the septum. Systolic function was moderately  reduced. The estimated ejection fraction was in the range of 35%  to 40%. Diffuse hypokinesis. There is akinesis of the  mid-apicalinferolateral and inferior myocardium. Doppler  parameters are consistent with abnormal left ventricular  relaxation (grade 1 diastolic dysfunction). - Aortic valve: Trileaflet; mildly thickened, mildly calcified  leaflets. There was mild regurgitation directed eccentrically in  the LVOT. - Aorta: Aortic root dimension: 38 mm (ED). - Ascending aorta: The ascending aorta was mildly dilated. - Mitral valve: Calcified annulus. - Right ventricle: The cavity size was moderately dilated. Wall  thickness was normal. Systolic function was moderately reduced.  Assessment/Plan: 1. V-fib Cardiac Arrest/ NSTEMI - with LV dysfunction - Post DES to proximal circumflex into OM1 with June 2017  Total distal circumflex occlusion right to left  collaterals. Patent stent cath repeat 10/08/16 Recurrent angina Collaterals make stress testing less appealing In light of new onset angina will arrange cath this week With Dr Martinique continue beta blocker and nitrates Risks of cath including stroke, MI bleeding need for emergency surgery discussed willing to proceed Orders written lab Dr Burt Knack to do on Friday   2. Ischemic CM -   Seen by EP and no AICD indicated  EF 45-50% by echo 04/10/16  LV gram during cath   3. HLD - continue zocor and zetia LDL at goal   4. Elevated LFTs - normal now on statin 09/08/16   5. Short term memory issues - greatly improved.   6. PVCs - seen by Dr Curt Bears continue beta blocker   7. Murmur: benign SEM echo 04/2016 only trivial AR  See #2 TTE ordered ? AV sclerosis     Jenkins Rouge

## 2018-02-02 ENCOUNTER — Other Ambulatory Visit: Payer: Self-pay | Admitting: Cardiovascular Disease

## 2018-02-02 MED FILL — MORPHINE SULFATE IR 30 MG T: 30 | 30 days supply | Qty: 60 | Fill #0

## 2018-02-02 MED FILL — EZETIMIBE 10 MG TABLET: 10 | 90 days supply | Qty: 90 | Fill #0

## 2018-02-08 ENCOUNTER — Other Ambulatory Visit: Payer: Self-pay | Admitting: Cardiovascular Disease

## 2018-02-08 ENCOUNTER — Ambulatory Visit: Payer: 59 | Admitting: Cardiovascular Disease

## 2018-02-08 VITALS — BP 106/64 | HR 56 | Ht 63.0 in | Wt 180.0 lb

## 2018-02-08 DIAGNOSIS — I2510 Atherosclerotic heart disease of native coronary artery without angina pectoris: Secondary | ICD-10-CM

## 2018-02-08 DIAGNOSIS — I209 Angina pectoris, unspecified: Secondary | ICD-10-CM

## 2018-02-08 DIAGNOSIS — R079 Chest pain, unspecified: Secondary | ICD-10-CM | POA: Diagnosis not present

## 2018-02-08 LAB — CBC WITH DIFFERENTIAL/PLATELET
BASOS: 1 %
Basophils Absolute: 0 10*3/uL (ref 0.0–0.2)
EOS (ABSOLUTE): 0.1 10*3/uL (ref 0.0–0.4)
EOS: 1 %
HEMATOCRIT: 37.3 % (ref 34.0–46.6)
HEMOGLOBIN: 12.5 g/dL (ref 11.1–15.9)
IMMATURE GRANULOCYTES: 0 %
Immature Grans (Abs): 0 10*3/uL (ref 0.0–0.1)
Lymphocytes Absolute: 1.9 10*3/uL (ref 0.7–3.1)
Lymphs: 43 %
MCH: 28.6 pg (ref 26.6–33.0)
MCHC: 33.5 g/dL (ref 31.5–35.7)
MCV: 85 fL (ref 79–97)
MONOCYTES: 9 %
MONOS ABS: 0.4 10*3/uL (ref 0.1–0.9)
NEUTROS PCT: 46 %
Neutrophils Absolute: 2.1 10*3/uL (ref 1.4–7.0)
Platelets: 267 10*3/uL (ref 150–450)
RBC: 4.37 x10E6/uL (ref 3.77–5.28)
RDW: 14.4 % (ref 12.3–15.4)
WBC: 4.5 10*3/uL (ref 3.4–10.8)

## 2018-02-08 LAB — BASIC METABOLIC PANEL
BUN/Creatinine Ratio: 14 (ref 12–28)
BUN: 11 mg/dL (ref 8–27)
CO2: 24 mmol/L (ref 20–29)
CREATININE: 0.8 mg/dL (ref 0.57–1.00)
Calcium: 9.3 mg/dL (ref 8.7–10.3)
Chloride: 106 mmol/L (ref 96–106)
GFR calc Af Amer: 90 mL/min/{1.73_m2} (ref >59)
GFR, EST NON AFRICAN AMERICAN: 78 mL/min/{1.73_m2} (ref >59)
GLUCOSE: 92 mg/dL (ref 65–99)
Potassium: 4.4 mmol/L (ref 3.5–5.2)
Sodium: 141 mmol/L (ref 134–144)

## 2018-02-08 NOTE — Patient Instructions (Addendum)
Medication Instructions:  Your physician recommends that you continue on your current medications as directed. Please refer to the Current Medication list given to you today.  Labwork: Your physician recommends that you have lab work today- BMET and CBC  Testing/Procedures: NONE  Follow-Up: Your physician wants you to follow-up in: 2 weeks with Dr. Johnsie Cancel or PA/NP.  If you need a refill on your cardiac medications before your next appointment, please call your pharmacy.     [facility] [place], [address] [place] [id number] Dept: [phone] Loc: [place]  02/08/2018  You are scheduled for a Cardiac Catheterization on Friday, September 6 with Dr. Sherren Mocha.  1. Please arrive at the [facility] (Main Entrance A) at [facility]: [address] [place], [place] [address] at 9:30 AM (This time is two hours before your procedure to ensure your preparation). Free valet parking service is available.   Special note: Every effort is made to have your procedure done on time. Please understand that emergencies sometimes delay scheduled procedures.  2. Diet: Do not eat solid foods after midnight.  The patient may have clear liquids until 5am upon the day of the procedure.  3. Labs: You will need to have blood drawn on Tuesday, September 3 at [facility] at [facility]. [address]  Open: 7:30am - 5pm    Phone: [phone]. You do not need to be fasting.  4. Medication instructions in preparation for your procedure:   Contrast Allergy: No  On the morning of your procedure, take your Plavix/Clopidogrel and any morning medicines NOT listed above.  You may use sips of water.  5. Plan for one night stay--bring personal belongings. 6. Bring a current list of your medications and current insurance cards. 7. You MUST have a responsible  person to drive you home. 8. Someone MUST be with you the first 24 hours after you arrive home or your discharge will be delayed. 9. Please wear clothes that are easy to get on and off and wear slip-on shoes.  Thank you for allowing [place] to care for you!   -- [facility]

## 2018-02-11 ENCOUNTER — Encounter (HOSPITAL_COMMUNITY): Payer: Self-pay

## 2018-02-11 ENCOUNTER — Inpatient Hospital Stay (HOSPITAL_COMMUNITY)
Admission: AD | Disposition: A | Discharge status: Home / Self Care | Payer: Self-pay | Source: Ambulatory Visit | Attending: Cardiovascular Disease

## 2018-02-11 ENCOUNTER — Inpatient Hospital Stay (HOSPITAL_COMMUNITY)
Admission: AD | Admit: 2018-02-11 | Discharge: 2018-02-13 | DRG: 287 | Disposition: A | Discharge status: Home / Self Care | Payer: 59 | Source: Ambulatory Visit | Attending: Cardiovascular Disease | Admitting: Cardiovascular Disease

## 2018-02-11 ENCOUNTER — Other Ambulatory Visit: Payer: Self-pay

## 2018-02-11 ENCOUNTER — Ambulatory Visit (HOSPITAL_COMMUNITY): Payer: 59

## 2018-02-11 DIAGNOSIS — M7981 Nontraumatic hematoma of soft tissue: Secondary | ICD-10-CM | POA: Diagnosis not present

## 2018-02-11 DIAGNOSIS — I97630 Postprocedural hematoma of a circulatory system organ or structure following a cardiac catheterization: Secondary | ICD-10-CM | POA: Diagnosis not present

## 2018-02-11 DIAGNOSIS — I959 Hypotension, unspecified: Secondary | ICD-10-CM | POA: Diagnosis not present

## 2018-02-11 DIAGNOSIS — I252 Old myocardial infarction: Secondary | ICD-10-CM

## 2018-02-11 DIAGNOSIS — I209 Angina pectoris, unspecified: Secondary | ICD-10-CM | POA: Diagnosis not present

## 2018-02-11 DIAGNOSIS — Z955 Presence of coronary angioplasty implant and graft: Secondary | ICD-10-CM

## 2018-02-11 DIAGNOSIS — E7800 Pure hypercholesterolemia, unspecified: Secondary | ICD-10-CM | POA: Diagnosis not present

## 2018-02-11 DIAGNOSIS — I2510 Atherosclerotic heart disease of native coronary artery without angina pectoris: Secondary | ICD-10-CM | POA: Diagnosis present

## 2018-02-11 DIAGNOSIS — Z86718 Personal history of other venous thrombosis and embolism: Secondary | ICD-10-CM

## 2018-02-11 DIAGNOSIS — Z79899 Other long term (current) drug therapy: Secondary | ICD-10-CM

## 2018-02-11 DIAGNOSIS — K5730 Diverticulosis of large intestine without perforation or abscess without bleeding: Secondary | ICD-10-CM | POA: Diagnosis not present

## 2018-02-11 DIAGNOSIS — I341 Nonrheumatic mitral (valve) prolapse: Secondary | ICD-10-CM | POA: Diagnosis present

## 2018-02-11 DIAGNOSIS — T82837A Hemorrhage of cardiac prosthetic devices, implants and grafts, initial encounter: Secondary | ICD-10-CM | POA: Diagnosis present

## 2018-02-11 DIAGNOSIS — Z885 Allergy status to narcotic agent status: Secondary | ICD-10-CM

## 2018-02-11 DIAGNOSIS — Z85828 Personal history of other malignant neoplasm of skin: Secondary | ICD-10-CM

## 2018-02-11 DIAGNOSIS — E039 Hypothyroidism, unspecified: Secondary | ICD-10-CM | POA: Diagnosis present

## 2018-02-11 DIAGNOSIS — Z888 Allergy status to other drugs, medicaments and biological substances status: Secondary | ICD-10-CM | POA: Diagnosis not present

## 2018-02-11 DIAGNOSIS — I255 Ischemic cardiomyopathy: Secondary | ICD-10-CM | POA: Diagnosis present

## 2018-02-11 DIAGNOSIS — I25110 Atherosclerotic heart disease of native coronary artery with unstable angina pectoris: Secondary | ICD-10-CM | POA: Diagnosis present

## 2018-02-11 DIAGNOSIS — I25119 Atherosclerotic heart disease of native coronary artery with unspecified angina pectoris: Secondary | ICD-10-CM

## 2018-02-11 DIAGNOSIS — I493 Ventricular premature depolarization: Secondary | ICD-10-CM | POA: Diagnosis not present

## 2018-02-11 DIAGNOSIS — Z96652 Presence of left artificial knee joint: Secondary | ICD-10-CM | POA: Diagnosis present

## 2018-02-11 DIAGNOSIS — D62 Acute posthemorrhagic anemia: Secondary | ICD-10-CM | POA: Diagnosis not present

## 2018-02-11 DIAGNOSIS — T148XXA Other injury of unspecified body region, initial encounter: Secondary | ICD-10-CM | POA: Diagnosis present

## 2018-02-11 DIAGNOSIS — Z7902 Long term (current) use of antithrombotics/antiplatelets: Secondary | ICD-10-CM | POA: Diagnosis not present

## 2018-02-11 DIAGNOSIS — Z79890 Hormone replacement therapy (postmenopausal): Secondary | ICD-10-CM

## 2018-02-11 DIAGNOSIS — K449 Diaphragmatic hernia without obstruction or gangrene: Secondary | ICD-10-CM | POA: Diagnosis not present

## 2018-02-11 DIAGNOSIS — K219 Gastro-esophageal reflux disease without esophagitis: Secondary | ICD-10-CM | POA: Diagnosis present

## 2018-02-11 DIAGNOSIS — I5042 Chronic combined systolic (congestive) and diastolic (congestive) heart failure: Secondary | ICD-10-CM | POA: Diagnosis present

## 2018-02-11 DIAGNOSIS — G2581 Restless legs syndrome: Secondary | ICD-10-CM | POA: Diagnosis present

## 2018-02-11 DIAGNOSIS — Z86711 Personal history of pulmonary embolism: Secondary | ICD-10-CM | POA: Diagnosis not present

## 2018-02-11 HISTORY — DX: Other injury of unspecified body region, initial encounter: T14.8XXA

## 2018-02-11 HISTORY — PX: LEFT HEART CATH AND CORONARY ANGIOGRAPHY: CATH118249

## 2018-02-11 HISTORY — PX: INTRAVASCULAR PRESSURE WIRE/FFR STUDY: CATH118243

## 2018-02-11 HISTORY — DX: Chronic combined systolic (congestive) and diastolic (congestive) heart failure: I50.42

## 2018-02-11 LAB — CBC
HCT: 42.1 % (ref 36.0–46.0)
HEMOGLOBIN: 13.4 g/dL (ref 12.0–15.0)
MCH: 29 pg (ref 26.0–34.0)
MCHC: 31.8 g/dL (ref 30.0–36.0)
MCV: 91.1 fL (ref 78.0–100.0)
PLATELETS: 226 10*3/uL (ref 150–400)
RBC: 4.62 MIL/uL (ref 3.87–5.11)
RDW: 13.8 % (ref 11.5–15.5)
WBC: 10 10*3/uL (ref 4.0–10.5)

## 2018-02-11 LAB — POCT ACTIVATED CLOTTING TIME: ACTIVATED CLOTTING TIME: 219 s

## 2018-02-11 LAB — PREPARE RBC (CROSSMATCH)

## 2018-02-11 LAB — MRSA PCR SCREENING: MRSA BY PCR: NEGATIVE

## 2018-02-11 SURGERY — LEFT HEART CATH AND CORONARY ANGIOGRAPHY
Anesthesia: LOCAL

## 2018-02-11 MED ORDER — EZETIMIBE 10 MG PO TABS
10.0000 mg | ORAL_TABLET | Freq: Every day | ORAL | Status: DC
  Administered 2018-02-12 – 2018-02-13 (×2): 10 mg via ORAL
  Filled 2018-02-11 (×2): qty 1

## 2018-02-11 MED ORDER — SODIUM CHLORIDE 0.9% FLUSH: 3.0000 mL | Freq: Two times a day (BID) | INTRAVENOUS | Status: DC

## 2018-02-11 MED ORDER — SODIUM CHLORIDE 0.9 % IV SOLN
INTRAVENOUS | Status: AC | PRN
  Administered 2018-02-11: 250 mL via INTRAVENOUS

## 2018-02-11 MED ORDER — DOPAMINE-DEXTROSE 3.2-5 MG/ML-% IV SOLN
INTRAVENOUS | Status: AC
  Filled 2018-02-11: qty 250

## 2018-02-11 MED ORDER — CYCLOBENZAPRINE HCL 10 MG PO TABS: 5.0000 mg | ORAL_TABLET | Freq: Three times a day (TID) | ORAL | Status: DC | PRN

## 2018-02-11 MED ORDER — LIDOCAINE HCL (PF) 1 % IJ SOLN
INTRAMUSCULAR | Status: DC | PRN
  Administered 2018-02-11: 2 mL
  Administered 2018-02-11: 15 mL

## 2018-02-11 MED ORDER — ADENOSINE 12 MG/4ML IV SOLN
INTRAVENOUS | Status: AC
  Filled 2018-02-11: qty 16

## 2018-02-11 MED ORDER — ASPIRIN EC 81 MG PO TBEC
81.0000 mg | DELAYED_RELEASE_TABLET | Freq: Every day | ORAL | Status: DC
  Administered 2018-02-12 – 2018-02-13 (×2): 81 mg via ORAL
  Filled 2018-02-11 (×2): qty 1

## 2018-02-11 MED ORDER — IOHEXOL 350 MG/ML SOLN
INTRAVENOUS | Status: DC | PRN
  Administered 2018-02-11: 60 mL via INTRACARDIAC

## 2018-02-11 MED ORDER — ASPIRIN 81 MG PO CHEW
81.0000 mg | CHEWABLE_TABLET | ORAL | Status: AC
  Administered 2018-02-11: 81 mg via ORAL
  Filled 2018-02-11: qty 1

## 2018-02-11 MED ORDER — DOPAMINE-DEXTROSE 3.2-5 MG/ML-% IV SOLN
INTRAVENOUS | Status: AC
  Administered 2018-02-11: 15 ug/kg/min
  Filled 2018-02-11: qty 250

## 2018-02-11 MED ORDER — SIMVASTATIN 40 MG PO TABS
40.0000 mg | ORAL_TABLET | Freq: Every day | ORAL | Status: DC
  Administered 2018-02-11 – 2018-02-12 (×2): 40 mg via ORAL
  Filled 2018-02-11 (×2): qty 1

## 2018-02-11 MED ORDER — ACETAMINOPHEN 325 MG PO TABS
650.0000 mg | ORAL_TABLET | ORAL | Status: DC | PRN
  Administered 2018-02-11 – 2018-02-12 (×3): 650 mg via ORAL
  Filled 2018-02-11 (×3): qty 2

## 2018-02-11 MED ORDER — SODIUM CHLORIDE 0.9 % IV SOLN
INTRAVENOUS | Status: AC | PRN
  Administered 2018-02-11: 999 mL/h via INTRAVENOUS
  Administered 2018-02-11: 14:00:00

## 2018-02-11 MED ORDER — SODIUM CHLORIDE 0.9 % WEIGHT BASED INFUSION
3.0000 mL/kg/h | INTRAVENOUS | Status: AC
  Administered 2018-02-11: 3 mL/kg/h via INTRAVENOUS

## 2018-02-11 MED ORDER — HEPARIN SODIUM (PORCINE) 1000 UNIT/ML IJ SOLN
INTRAMUSCULAR | Status: AC
  Filled 2018-02-11: qty 1

## 2018-02-11 MED ORDER — ONDANSETRON HCL 4 MG/2ML IJ SOLN
INTRAMUSCULAR | Status: DC | PRN
  Administered 2018-02-11: 4 mg via INTRAVENOUS

## 2018-02-11 MED ORDER — ESCITALOPRAM OXALATE 10 MG PO TABS
20.0000 mg | ORAL_TABLET | Freq: Every morning | ORAL | Status: DC
  Administered 2018-02-12 – 2018-02-13 (×2): 20 mg via ORAL
  Filled 2018-02-11 (×2): qty 2

## 2018-02-11 MED ORDER — NITROGLYCERIN 0.4 MG SL SUBL: 0.4000 mg | SUBLINGUAL_TABLET | SUBLINGUAL | Status: DC | PRN

## 2018-02-11 MED ORDER — LEVOTHYROXINE SODIUM 25 MCG PO TABS
137.0000 ug | ORAL_TABLET | Freq: Every day | ORAL | Status: DC
  Administered 2018-02-12 – 2018-02-13 (×2): 137 ug via ORAL
  Filled 2018-02-11 (×2): qty 1

## 2018-02-11 MED ORDER — SODIUM CHLORIDE 0.9 % WEIGHT BASED INFUSION: 1.0000 mL/kg/h | INTRAVENOUS | Status: AC

## 2018-02-11 MED ORDER — ONDANSETRON HCL 4 MG/2ML IJ SOLN
4.0000 mg | Freq: Four times a day (QID) | INTRAMUSCULAR | Status: DC | PRN
  Administered 2018-02-11 (×2): 4 mg via INTRAVENOUS
  Filled 2018-02-11: qty 2

## 2018-02-11 MED ORDER — SODIUM CHLORIDE 0.9% IV SOLUTION: Freq: Once | INTRAVENOUS | Status: DC

## 2018-02-11 MED ORDER — SODIUM CHLORIDE 0.9 % IV SOLN: 250.0000 mL | INTRAVENOUS | Status: DC | PRN

## 2018-02-11 MED ORDER — HEPARIN (PORCINE) IN NACL 1000-0.9 UT/500ML-% IV SOLN
INTRAVENOUS | Status: AC
  Filled 2018-02-11: qty 1000

## 2018-02-11 MED ORDER — BUTALBITAL-APAP-CAFFEINE 50-325-40 MG PO TABS: 1.0000 | ORAL_TABLET | Freq: Two times a day (BID) | ORAL | Status: DC | PRN

## 2018-02-11 MED ORDER — ATROPINE SULFATE 1 MG/10ML IJ SOSY
PREFILLED_SYRINGE | INTRAMUSCULAR | Status: AC
  Filled 2018-02-11: qty 10

## 2018-02-11 MED ORDER — MIDAZOLAM HCL 2 MG/2ML IJ SOLN
INTRAMUSCULAR | Status: AC
  Filled 2018-02-11: qty 2

## 2018-02-11 MED ORDER — OMEGA-3-ACID ETHYL ESTERS 1 G PO CAPS
2.0000 g | ORAL_CAPSULE | Freq: Two times a day (BID) | ORAL | Status: DC
  Administered 2018-02-11 – 2018-02-13 (×4): 2 g via ORAL
  Filled 2018-02-11 (×4): qty 2

## 2018-02-11 MED ORDER — DOPAMINE-DEXTROSE 3.2-5 MG/ML-% IV SOLN
0.0000 ug/kg/min | INTRAVENOUS | Status: DC
  Administered 2018-02-11: 15 ug/kg/min via INTRAVENOUS

## 2018-02-11 MED ORDER — ATROPINE SULFATE 1 MG/10ML IJ SOSY
PREFILLED_SYRINGE | INTRAMUSCULAR | Status: DC | PRN
  Administered 2018-02-11: 0.5 mg via INTRAVENOUS

## 2018-02-11 MED ORDER — FENOFIBRATE 160 MG PO TABS
160.0000 mg | ORAL_TABLET | Freq: Every day | ORAL | Status: DC
  Administered 2018-02-11 – 2018-02-12 (×2): 160 mg via ORAL
  Filled 2018-02-11 (×2): qty 1

## 2018-02-11 MED ORDER — HEPARIN (PORCINE) IN NACL 1000-0.9 UT/500ML-% IV SOLN
INTRAVENOUS | Status: DC | PRN
  Administered 2018-02-11 (×2): 500 mL

## 2018-02-11 MED ORDER — SODIUM CHLORIDE 0.9 % IV SOLN
INTRAVENOUS | Status: DC
  Administered 2018-02-11 – 2018-02-12 (×2): via INTRAVENOUS

## 2018-02-11 MED ORDER — SODIUM CHLORIDE 0.9 % IV SOLN
INTRAVENOUS | Status: AC | PRN
  Administered 2018-02-11: 999 mL/h via INTRAVENOUS

## 2018-02-11 MED ORDER — FENTANYL CITRATE (PF) 100 MCG/2ML IJ SOLN
INTRAMUSCULAR | Status: AC
  Filled 2018-02-11: qty 2

## 2018-02-11 MED ORDER — SODIUM CHLORIDE 0.9 % WEIGHT BASED INFUSION: 1.0000 mL/kg/h | INTRAVENOUS | Status: DC

## 2018-02-11 MED ORDER — MIDAZOLAM HCL 2 MG/2ML IJ SOLN
INTRAMUSCULAR | Status: DC | PRN
  Administered 2018-02-11 (×2): 1 mg via INTRAVENOUS

## 2018-02-11 MED ORDER — ROPINIROLE HCL 0.5 MG PO TABS
0.5000 mg | ORAL_TABLET | Freq: Every day | ORAL | Status: DC
  Administered 2018-02-11 – 2018-02-12 (×2): 0.5 mg via ORAL
  Filled 2018-02-11 (×2): qty 1

## 2018-02-11 MED ORDER — VERAPAMIL HCL 2.5 MG/ML IV SOLN
INTRAVENOUS | Status: AC
  Filled 2018-02-11: qty 2

## 2018-02-11 MED ORDER — LIDOCAINE HCL (PF) 1 % IJ SOLN
INTRAMUSCULAR | Status: AC
  Filled 2018-02-11: qty 30

## 2018-02-11 MED ORDER — SODIUM CHLORIDE 0.9% FLUSH
3.0000 mL | Freq: Two times a day (BID) | INTRAVENOUS | Status: DC
  Administered 2018-02-11 – 2018-02-12 (×3): 3 mL via INTRAVENOUS

## 2018-02-11 MED ORDER — DOPAMINE-DEXTROSE 3.2-5 MG/ML-% IV SOLN
INTRAVENOUS | Status: AC | PRN
  Administered 2018-02-11: 5 ug/kg/min via INTRAVENOUS

## 2018-02-11 MED ORDER — ADENOSINE (DIAGNOSTIC) 140MCG/KG/MIN
INTRAVENOUS | Status: DC | PRN
  Administered 2018-02-11: 140 ug/kg/min via INTRAVENOUS

## 2018-02-11 MED ORDER — FENTANYL CITRATE (PF) 100 MCG/2ML IJ SOLN
INTRAMUSCULAR | Status: DC | PRN
  Administered 2018-02-11 (×2): 25 ug via INTRAVENOUS

## 2018-02-11 MED ORDER — ONDANSETRON HCL 4 MG/2ML IJ SOLN
INTRAMUSCULAR | Status: AC
  Filled 2018-02-11: qty 2

## 2018-02-11 MED ORDER — SODIUM CHLORIDE 0.9% FLUSH: 3.0000 mL | INTRAVENOUS | Status: DC | PRN

## 2018-02-11 MED ORDER — HEPARIN SODIUM (PORCINE) 1000 UNIT/ML IJ SOLN
INTRAMUSCULAR | Status: DC | PRN
  Administered 2018-02-11: 6000 [IU] via INTRAVENOUS

## 2018-02-11 MED ORDER — FENTANYL CITRATE (PF) 100 MCG/2ML IJ SOLN
25.0000 ug | INTRAMUSCULAR | Status: DC | PRN
  Administered 2018-02-12: 50 ug via INTRAVENOUS
  Filled 2018-02-11: qty 2

## 2018-02-11 SURGICAL SUPPLY — 17 items
CATH INFINITI 5FR MULTPACK ANG (CATHETERS) ×1 IMPLANT
CATH LAUNCHER 5F EBU3.5 (CATHETERS) ×1 IMPLANT
DEVICE CLOSURE PERCLS PRGLD 6F (VASCULAR PRODUCTS) IMPLANT
ELECT DEFIB PAD ADLT CADENCE (PAD) ×1 IMPLANT
GLIDESHEATH SLEND SS 6F .021 (SHEATH) ×1 IMPLANT
GUIDEWIRE INQWIRE 1.5J.035X260 (WIRE) IMPLANT
GUIDEWIRE PRESSURE COMET II (WIRE) ×1 IMPLANT
INQWIRE 1.5J .035X260CM (WIRE) ×2
KIT ESSENTIALS PG (KITS) ×1 IMPLANT
KIT HEART LEFT (KITS) ×2 IMPLANT
PACK CARDIAC CATHETERIZATION (CUSTOM PROCEDURE TRAY) ×2 IMPLANT
PERCLOSE PROGLIDE 6F (VASCULAR PRODUCTS) ×4
SHEATH PINNACLE 5F 10CM (SHEATH) ×1 IMPLANT
SHEATH PROBE COVER 6X72 (BAG) ×1 IMPLANT
TRANSDUCER W/STOPCOCK (MISCELLANEOUS) ×2 IMPLANT
TUBING CIL FLEX 10 FLL-RA (TUBING) ×2 IMPLANT
WIRE EMERALD 3MM-J .035X150CM (WIRE) ×1 IMPLANT

## 2018-02-11 NOTE — Interval H&P Note (Signed)
Cath Lab Visit (complete for each Cath Lab visit)  Clinical Evaluation Leading to the Procedure:   ACS: No.  Non-ACS:    Anginal Classification: CCS II  Anti-ischemic medical therapy: Maximal Therapy (2 or more classes of medications)  Non-Invasive Test Results: No non-invasive testing performed  Prior CABG: No previous CABG      History and Physical Interval Note:  02/11/2018 10:38 AM  Brittany Mason  has presented today for surgery, with the diagnosis of cp  The various methods of treatment have been discussed with the patient and family. After consideration of risks, benefits and other options for treatment, the patient has consented to  Procedure(s): LEFT HEART CATH AND CORS/GRAFTS ANGIOGRAPHY (N/A) as a surgical intervention .  [name] history has been reviewed, patient examined, no change in status, stable for surgery.  I have reviewed the patient's chart and labs.  Questions were answered to the patient's satisfaction.     Sherren Mocha

## 2018-02-11 NOTE — Plan of Care (Signed)

## 2018-02-11 NOTE — Progress Notes (Signed)
PM Round: Pt doing better - still with significant groin/lower abdominal pain associated with movement, comfortable at rest.   SBP > 100 mmHg on dopamine 15 mcg, no chest pain or shortness of breath  Appreciate Dr Nicole Cella evaluation. Plan repeat CBC post-transfusion, monitor in CV-ICU. Continue current Rx.  Sherren Mocha 02/11/2018 4:11 PM

## 2018-02-11 NOTE — Consult Note (Signed)
REASON FOR CONSULT:    Retroperitoneal hematoma status post cardiac catheterization.  The consult is requested by Dr. Sherren Mocha.  HPI:   Brittany Mason is a pleasant 67 y.o. female, with a history of significant cardiac disease.  She had been having chest pain and presented for cardiac catheterization.  She underwent cardiac catheterization today via a right femoral approach.  After the procedure she developed some swelling in the right groin and hypotension.  Pressure was held for hemostasis.  A CT scan of the abdomen and pelvis was obtained which showed some retroperitoneal hematoma.  For this reason vascular surgery was consulted.  Her heart cath showed moderate two-vessel coronary disease involving the circumflex and RCA.  The left main and LAD were patent with mild nonobstructive disease.  The patient is currently in Cath Lab holding and is not having significant pain.  Pressure is being held on the groin.  Past Medical History:  Diagnosis Date  . Acute pulmonary embolism ([place]) 06/20/2015  . Arthritis    osteoarthritis-knees. Chronic back pain  . Basal cell carcinoma of right shoulder    "burned off"  . Chronic SI joint pain   . DVT (deep venous thrombosis) ([place]) 04/2013   left lower leg, resulted in Pulmonary emboli on hormone therapy  . Dyspnea on exertion   . GERD (gastroesophageal reflux disease)   . Hypercholesterolemia   . Hypothyroidism   . Migraine    "< once/month" (12/04/2015)  . MVP (mitral valve prolapse)    asymptomatic  . Myocardial infarction ([place]) 11/19/2015   cardiac arrest  . PE (pulmonary embolism) Apr 18, 2013   tx. -using Xarelto now, no further lung problems, denies SOB on 01-02-14  . PONV (postoperative nausea and vomiting)   . RLS (restless legs syndrome)     Family History  Problem Relation Age of Onset  . Hypertension Mother   . Congestive Heart Failure Mother   . Coronary artery disease Father   . Hypertension Father   . Stroke Brother         DIED AT 14  . Cancer Maternal Aunt        lung, colon  . Breast cancer Maternal Aunt   . Breast cancer Paternal Aunt     SOCIAL HISTORY: Social History   Socioeconomic History  . Marital status: Single    Spouse name: Not on file  . Number of children: Not on file  . Years of education: Not on file  . Highest education level: Not on file  Occupational History  . Not on file  Social Needs  . Financial resource strain: Not on file  . Food insecurity:    Worry: Not on file    Inability: Not on file  . Transportation needs:    Medical: Not on file    Non-medical: Not on file  Tobacco Use  . Smoking status: Never Smoker  . Smokeless tobacco: Never Used  Substance and Sexual Activity  . Alcohol use: No  . Drug use: No  . Sexual activity: Never    Birth control/protection: Surgical    Comment: HYSTERECTOMY  Lifestyle  . Physical activity:    Days per week: Not on file    Minutes per session: Not on file  . Stress: Not on file  Relationships  . Social connections:    Talks on phone: Not on file    Gets together: Not on file    Attends religious service: Not on file  Active member of club or organization: Not on file    Attends meetings of clubs or organizations: Not on file    Relationship status: Not on file  . Intimate partner violence:    Fear of current or ex partner: Not on file    Emotionally abused: Not on file    Physically abused: Not on file    Forced sexual activity: Not on file  Other Topics Concern  . Not on file  Social History Narrative   Lives with close friend.    Allergies  Allergen Reactions  . Codeine Nausea And Vomiting and Other (See Comments)    Takes hydrocodone; if takes doses too close together, states "violently throws up"  . Erythromycin Hives and Itching    Current Facility-Administered Medications  Medication Dose Route Frequency Provider Last Rate Last Dose  . 0.9 %  sodium chloride infusion (Manually program via  Guardrails IV Fluids)   Intravenous Once Sherren Mocha, MD      . 0.9 %  sodium chloride infusion  250 mL Intravenous PRN Josue Hector, MD      . 0.9% sodium chloride infusion  1 mL/kg/hr Intravenous Continuous Josue Hector, MD 81.6 mL/hr at 02/11/18 1120 1 mL/kg/hr at 02/11/18 1120  . DOPamine (INTROPIN) 800 mg in dextrose 5 % 250 mL (3.2 mg/mL) infusion  0-20 mcg/kg/min Intravenous Titrated Sherren Mocha, MD      . ondansetron [facility]) injection 4 mg  4 mg Intravenous Q6H PRN Sherren Mocha, MD   4 mg at 02/11/18 1318  . sodium chloride flush (NS) 0.9 % injection 3 mL  3 mL Intravenous Q12H Josue Hector, MD      . sodium chloride flush (NS) 0.9 % injection 3 mL  3 mL Intravenous PRN Josue Hector, MD        REVIEW OF SYSTEMS:  [X]  denotes positive finding, [ ]  denotes negative finding Cardiac  Comments:  Chest pain or chest pressure: x   Shortness of breath upon exertion: x   Short of breath when lying flat:    Irregular heart rhythm:        Vascular    Pain in calf, thigh, or hip brought on by ambulation:    Pain in feet at night that wakes you up from your sleep:     Blood clot in your veins:    Leg swelling:         Pulmonary    Oxygen at home:    Productive cough:     Wheezing:         Neurologic    Sudden weakness in arms or legs:     Sudden numbness in arms or legs:     Sudden onset of difficulty speaking or slurred speech:    Temporary loss of vision in one eye:     Problems with dizziness:         Gastrointestinal    Blood in stool:     Vomited blood:         Genitourinary    Burning when urinating:     Blood in urine:        Psychiatric    Major depression:         Hematologic    Bleeding problems:    Problems with blood clotting too easily:        Skin    Rashes or ulcers:        Constitutional    Fever or  chills:     PHYSICAL EXAM:   Vitals:   02/11/18 1335 02/11/18 1340 02/11/18 1345 02/11/18 1346  BP: (!) 101/36 (!) 105/44 (!)  114/41   Pulse: 89 97 (!) 105   Resp: 14 12 12    Temp:    (!) 97.5 F (36.4 C)  TempSrc:    Temporal  SpO2: 92% 94% 95%   Weight:      Height:        GENERAL: The patient is a well-nourished female, in no acute distress. The vital signs are documented above. CARDIAC: There is a regular rate and rhythm.  VASCULAR: Pressures being held on the right groin.  She does have a palpable left femoral pulse.  She has palpable dorsalis pedis pulses bilaterally. PULMONARY: There is good air exchange bilaterally without wheezing or rales. ABDOMEN: Soft and non-tender with normal pitched bowel sounds.  Currently there is no significant abdominal swelling. MUSCULOSKELETAL: There are no major deformities or cyanosis. NEUROLOGIC: No focal weakness or paresthesias are detected. SKIN: There are no ulcers or rashes noted. PSYCHIATRIC: The patient has a normal affect.  DATA:    CT ABDOMEN PELVIS: On my review the films there is a hematoma in the abdominal wall primarily with minimal retroperitoneal hematoma.  There is no evidence of active bleeding.   ASSESSMENT & PLAN:   HEMATOMA STATUS POST HEART CATHETERIZATION: Currently the patient is stable.  There is no evidence of active bleeding.  She is to receive 2 units of packed red blood cells.  We will follow closely.  I would only recommend surgical intervention if she had evidence of recurrent bleeding.  We will follow closely.   Brittany Mason [facility] [phone]

## 2018-02-11 NOTE — Progress Notes (Signed)
Pt returned from CT: retroperitoneal hematoma present. Pressure held over femoral arteriotomy and right lower abdomen by cath lab staff. Pt stable on dopamine and IV fluid resuscitation at present. Orders written for stat PRBC transfusion 2 units. Vascular surgery called - Dr Scot Dock to evaluate. Family (sister) updated. Awaiting CV-ICU bed.  Sherren Mocha 02/11/2018 12:52 PM

## 2018-02-11 NOTE — Progress Notes (Signed)
Dr. Burt Knack updated on patient: Vital signs,  Dopamine at 10mcg, NS at 250cc/hr, and blood transfusion. Unable to void w/purewick. Order received for foley.

## 2018-02-12 DIAGNOSIS — I2510 Atherosclerotic heart disease of native coronary artery without angina pectoris: Secondary | ICD-10-CM

## 2018-02-12 DIAGNOSIS — I209 Angina pectoris, unspecified: Secondary | ICD-10-CM

## 2018-02-12 LAB — TYPE AND SCREEN
ABO/RH(D): A POS
Antibody Screen: NEGATIVE
UNIT DIVISION: 0
UNIT DIVISION: 0

## 2018-02-12 LAB — CBC
HEMATOCRIT: 37.3 % (ref 36.0–46.0)
Hemoglobin: 12.1 g/dL (ref 12.0–15.0)
MCH: 28.9 pg (ref 26.0–34.0)
MCHC: 32.4 g/dL (ref 30.0–36.0)
MCV: 89 fL (ref 78.0–100.0)
PLATELETS: 186 10*3/uL (ref 150–400)
RBC: 4.19 MIL/uL (ref 3.87–5.11)
RDW: 13.7 % (ref 11.5–15.5)
WBC: 6.7 10*3/uL (ref 4.0–10.5)

## 2018-02-12 LAB — BPAM RBC
Blood Product Expiration Date: 201910012359
Blood Product Expiration Date: 201910012359
ISSUE DATE / TIME: 201909061358
ISSUE DATE / TIME: 201909061809
Unit Type and Rh: 6200
Unit Type and Rh: 6200

## 2018-02-12 LAB — BASIC METABOLIC PANEL
Anion gap: 7 (ref 5–15)
BUN: 5 mg/dL — ABNORMAL LOW (ref 8–23)
CO2: 26 mmol/L (ref 22–32)
Calcium: 8.3 mg/dL — ABNORMAL LOW (ref 8.9–10.3)
Chloride: 109 mmol/L (ref 98–111)
Creatinine, Ser: 0.73 mg/dL (ref 0.44–1.00)
GFR calc Af Amer: >60 mL/min (ref >60)
GFR calc non Af Amer: >60 mL/min (ref >60)
Glucose, Bld: 107 mg/dL — ABNORMAL HIGH (ref 70–99)
Potassium: 3.7 mmol/L (ref 3.5–5.1)
Sodium: 142 mmol/L (ref 135–145)

## 2018-02-12 NOTE — Progress Notes (Signed)
   VASCULAR SURGERY ASSESSMENT & PLAN:   MODERATE INTRAMUSCULAR HEMATOMA RIGHT LATERAL ABDOMINAL WALL: Her hemoglobin is stable at 12.1 this morning.  She is comfortable sitting up in the chair.  I think it is safe to resume activity.  No evidence of active bleeding.  She is on minimal dopamine.  SUBJECTIVE:   No complaints this morning.  PHYSICAL EXAM:   Vitals:   02/12/18 0500 02/12/18 0530 02/12/18 0600 02/12/18 0630  BP: (!) 100/50 (!) 101/54 (!) 106/59 (!) 122/58  Pulse: (!) 54 61 (!) 57 (!) 58  Resp: 14 11 15 13   Temp:      TempSrc:      SpO2: 90% 94% 98% 100%  Weight:   84.9 kg   Height:       Right groin is stable with minimal swelling. She has a palpable right dorsalis pedis pulse.  LABS:   Lab Results  Component Value Date   WBC 6.7 02/12/2018   HGB 12.1 02/12/2018   HCT 37.3 02/12/2018   MCV 89.0 02/12/2018   PLT 186 02/12/2018   Lab Results  Component Value Date   CREATININE 0.73 02/12/2018   Lab Results  Component Value Date   INR 1.0 10/05/2016    PROBLEM LIST:    Active Problems:   Angina pectoris ([place])   Retroperitoneal hemorrhage complicating cardiac catheterization   CURRENT MEDS:   . sodium chloride   Intravenous Once  . aspirin EC  81 mg Oral Daily  . escitalopram  20 mg Oral q morning - 10a  . ezetimibe  10 mg Oral Daily  . fenofibrate  160 mg Oral QHS  . levothyroxine  137 mcg Oral QAC breakfast  . omega-3 acid ethyl esters  2 g Oral BID  . rOPINIRole  0.5 mg Oral QHS  . simvastatin  40 mg Oral QHS  . sodium chloride flush  3 mL Intravenous Q12H    Deitra Mayo Beeper: [phone] Office: [phone] 02/12/2018

## 2018-02-12 NOTE — Progress Notes (Signed)
Progress Note  Patient Name: Brittany Mason Date of Encounter: 02/12/2018  Primary Cardiologist: Jenkins Rouge, MD   Subjective   Feeling well. Groin sore.  No chest pain or shortness of breath.  Inpatient Medications    Scheduled Meds: . sodium chloride   Intravenous Once  . aspirin EC  81 mg Oral Daily  . escitalopram  20 mg Oral q morning - 10a  . ezetimibe  10 mg Oral Daily  . fenofibrate  160 mg Oral QHS  . levothyroxine  137 mcg Oral QAC breakfast  . omega-3 acid ethyl esters  2 g Oral BID  . rOPINIRole  0.5 mg Oral QHS  . simvastatin  40 mg Oral QHS  . sodium chloride flush  3 mL Intravenous Q12H   Continuous Infusions: . sodium chloride    . sodium chloride 100 mL/hr at 02/12/18 0600  . DOPamine 2 mcg/kg/min (02/12/18 0600)   PRN Meds: sodium chloride, acetaminophen, cyclobenzaprine, fentaNYL (SUBLIMAZE) injection, nitroGLYCERIN, ondansetron (ZOFRAN) IV, sodium chloride flush   Vital Signs    Vitals:   02/12/18 0600 02/12/18 0630 02/12/18 0700 02/12/18 0756  BP: (!) 106/59 (!) 122/58 (!) 102/53   Pulse: (!) 57 (!) 58 (!) 53   Resp: 15 13 11    Temp:    98.5 F (36.9 C)  TempSrc:    Oral  SpO2: 98% 100% 97%   Weight: 84.9 kg     Height:        Intake/Output Summary (Last 24 hours) at 02/12/2018 0811 Last data filed at 02/12/2018 0600 Gross per 24 hour  Intake 5223.91 ml  Output 3350 ml  Net 1873.91 ml   Filed Weights   02/11/18 0957 02/12/18 0600  Weight: 79.4 kg 84.9 kg    Telemetry    Sinus rhythm.  PACs and PVCs. - Personally Reviewed  ECG    n/a - Personally Reviewed  Physical Exam   VS:  BP (!) 102/53   Pulse (!) 53   Temp 98.5 F (36.9 C) (Oral)   Resp 11   Ht 5\' 3"  (1.6 m)   Wt 84.9 kg   SpO2 97%   BMI 33.16 kg/m  , BMI Body mass index is 33.16 kg/m. GENERAL:  Well appearing HEENT: Pupils equal round and reactive, fundi not visualized, oral mucosa unremarkable NECK:  No jugular venous distention, waveform within normal  limits, carotid upstroke brisk and symmetric, no bruits, no thyromegaly LYMPHATICS:  No cervical adenopathy LUNGS:  Clear to auscultation bilaterally HEART:  RRR.  PMI not displaced or sustained,S1 and S2 within normal limits, no S3, no S4, no clicks, no rubs, no murmurs ABD:  Flat, positive bowel sounds normal in frequency in pitch, no bruits, no rebound, no guarding, no midline pulsatile mass, no hepatomegaly, no splenomegaly EXT:  2 plus pulses throughout, no edema, no cyanosis no clubbing.  R groin tender.  No hematoma.  Minimal ecchymosis.  SKIN:  No rashes no nodules NEURO:  Cranial nerves II through XII grossly intact, motor grossly intact throughout PSYCH:  Cognitively intact, oriented to person place and time   Labs    Chemistry Recent Labs  Lab 02/08/18 0904 02/12/18 0254  NA 141 142  K 4.4 3.7  CL 106 109  CO2 24 26  GLUCOSE 92 107*  BUN 11 5*  CREATININE 0.80 0.73  CALCIUM 9.3 8.3*  GFRNONAA 78 >60  GFRAA 90 >60  ANIONGAP  --  7     Hematology Recent Labs  Lab 02/08/18 0904 02/11/18 1612 02/12/18 0254  WBC 4.5 10.0 6.7  RBC 4.37 4.62 4.19  HGB 12.5 13.4 12.1  HCT 37.3 42.1 37.3  MCV 85 91.1 89.0  MCH 28.6 29.0 28.9  MCHC 33.5 31.8 32.4  RDW 14.4 13.8 13.7  PLT 267 226 186    Cardiac EnzymesNo results for input(s): TROPONINI in the last 168 hours. No results for input(s): TROPIPOC in the last 168 hours.   BNPNo results for input(s): BNP, PROBNP in the last 168 hours.   DDimer No results for input(s): DDIMER in the last 168 hours.   Radiology    Ct Abdomen Pelvis Wo Contrast  Result Date: 02/11/2018 CLINICAL DATA:  Hypotension status post catheterization. EXAM: CT ABDOMEN AND PELVIS WITHOUT CONTRAST TECHNIQUE: Multidetector CT imaging of the abdomen and pelvis was performed following the standard protocol without IV contrast. COMPARISON:  11/20/2015 abdominal radiograph. FINDINGS: Lower chest: No significant pulmonary nodules or acute consolidative  airspace disease. Coronary atherosclerosis. Hepatobiliary: Normal liver size. No liver mass. Normal gallbladder with no radiopaque cholelithiasis. No biliary ductal dilatation. Pancreas: Normal, with no mass or duct dilation. Spleen: Normal size. No mass. Adrenals/Urinary Tract: Normal adrenals. Normal kidneys with no hydronephrosis and no renal masses. Excreted contrast is noted within the renal collecting systems, ureters and bladder. Normal bladder. Stomach/Bowel: Small hiatal hernia. Otherwise normal nondistended stomach. Normal caliber small bowel with no small bowel wall thickening. Normal appendix. Minimal sigmoid diverticulosis, with no large bowel wall thickening or significant pericolonic fat stranding. Vascular/Lymphatic: Atherosclerotic nonaneurysmal abdominal aorta. No pathologically enlarged lymph nodes in the abdomen or pelvis. Reproductive: Status post hysterectomy, with no abnormal findings at the vaginal cuff. No adnexal mass. Other: No pneumoperitoneum. There is a large intramuscular hematoma in the right lateral abdominal muscle wall measuring 9.4 x 4.4 x 15.0 cm (series 3/image 47), which extends into the ventral right pelvic muscle wall. There are smaller right retroperitoneal hematomas measuring 3.4 x 1.7 x 3.0 cm at the level of right iliac crest (series 3/image 56) and measuring 4.1 x 2.0 x 4.2 cm anterior to the right external iliac vessels (series 3/image 64). There is subcutaneous scattered hemorrhage in the right inguinal region. Musculoskeletal: No aggressive appearing focal osseous lesions. Marked degenerative disc disease at L5-S1. IMPRESSION: 1. Large intramuscular hematoma in the right lateral abdominal muscle wall extending inferiorly into the ventral right pelvic muscle wall. 2. Smaller right retroperitoneal hematoma as described. 3. No intraperitoneal collections. 4. Small hiatal hernia. 5. Minimal sigmoid diverticulosis. 6.  Aortic Atherosclerosis (ICD10-I70.0).  Coronary  atherosclerosis. These results will be called to the ordering clinician or representative by the Radiologist Assistant, and communication documented in the PACS or zVision Dashboard. Electronically Signed   By: Ilona Sorrel M.D.   On: 02/11/2018 16:03    Cardiac Studies   LHC 02/11/18: 1. Moderate 2 vessel CAD involving the LCx and RCA and chronic occlusion of the distal LCx collateralized from L--->L collaterals 2. Patent left main and LAD with mild nonobstructive disease 3. Low LVEDP 4. Negative FFR of the left circumflex  Recommend: continued medical therapy. There is progressive stenosis of the right PDA but this vessel is diffusely diseased and not favorable for PCI.  Procedure complicated by hypotension after hemostasis is obtained. 0.5 mg atropine and IV fluids are administered. Groin site is tender into the right lower quadrant. Plan stat non-contrast CT.  Echo 04/10/16: Study Conclusions  - Left ventricle: The cavity size was normal. Systolic function was   mildly  reduced. The estimated ejection fraction was in the range   of 45% to 50%. Moderate hypokinesis of the   mid-apicalanterolateral and inferolateral myocardium; consistent   with infarction in the distribution of the left circumflex   coronary artery. Doppler parameters are consistent with abnormal   left ventricular relaxation (grade 1 diastolic dysfunction). - Aortic valve: There was trivial regurgitation. - Atrial septum: No defect or patent foramen ovale was identified.   Patient Profile     67 y.o. female with CAD, s/p VF arrest during dath 02/7947, chronic systolic and diastolic heart failure, PE on HRT, hypothyroidism here with RP bleed during cath.  Assessment & Plan    # Intramuscular hematoma: Large bleeds in the R lateral abdominal muscle into ventral R pelvic muscle and around the iliac crest and iliac vessels.  Required 2 units of pRBCs.  H/h stable this AM and she is off dopamine.  Holding  clopidogrel.   # CAD: # Hyperlipidemia: Presented for unstable angina.  Went for cath 02/11/18 and had moderate 2 vessel CAD in the LCx and RCA with chronic occlusion of the distal LCx with L-->L collaterals.  Medical therapy recommended.  Complicated by bleed.  Due for lipids.   WIll check in AM.  Continue Zetia, simvastatin, fenofibrate, and fish oil.  # Chronic systolic and diastolic heart failure: LVEF 45-50%.  She is euvolemic.  Resume metoprolol as BP allows.   For questions or updates, please contact [place] Please consult [email or web address] for contact info under        Signed, Skeet Latch, MD  02/12/2018, 8:11 AM

## 2018-02-12 NOTE — Progress Notes (Signed)
Interventional Note: Pt looks good this am. Off dopamine, BP stable. Groin/abdominal exam improved - pt with mild tenderness to palpation no firm hematoma. H/H stable. Should be fine to resume plavix and DC home tomorrow. Appreciate care of weekend team.  Sherren Mocha 02/12/2018 11:02 AM

## 2018-02-13 ENCOUNTER — Encounter (HOSPITAL_COMMUNITY): Payer: Self-pay | Admitting: Cardiology

## 2018-02-13 DIAGNOSIS — E7800 Pure hypercholesterolemia, unspecified: Secondary | ICD-10-CM

## 2018-02-13 DIAGNOSIS — D62 Acute posthemorrhagic anemia: Secondary | ICD-10-CM | POA: Diagnosis present

## 2018-02-13 DIAGNOSIS — T148XXA Other injury of unspecified body region, initial encounter: Secondary | ICD-10-CM

## 2018-02-13 DIAGNOSIS — I5042 Chronic combined systolic (congestive) and diastolic (congestive) heart failure: Secondary | ICD-10-CM

## 2018-02-13 HISTORY — DX: Other injury of unspecified body region, initial encounter: T14.8XXA

## 2018-02-13 HISTORY — DX: Chronic combined systolic (congestive) and diastolic (congestive) heart failure: I50.42

## 2018-02-13 LAB — CBC
HCT: 42 % (ref 36.0–46.0)
Hemoglobin: 13.6 g/dL (ref 12.0–15.0)
MCH: 29.6 pg (ref 26.0–34.0)
MCHC: 32.4 g/dL (ref 30.0–36.0)
MCV: 91.3 fL (ref 78.0–100.0)
PLATELETS: 190 10*3/uL (ref 150–400)
RBC: 4.6 MIL/uL (ref 3.87–5.11)
RDW: 14 % (ref 11.5–15.5)
WBC: 7.8 10*3/uL (ref 4.0–10.5)

## 2018-02-13 LAB — BASIC METABOLIC PANEL
ANION GAP: 11 (ref 5–15)
BUN: 11 mg/dL (ref 8–23)
CHLORIDE: 110 mmol/L (ref 98–111)
CO2: 19 mmol/L — AB (ref 22–32)
Calcium: 8.9 mg/dL (ref 8.9–10.3)
Creatinine, Ser: 0.76 mg/dL (ref 0.44–1.00)
GFR calc non Af Amer: >60 mL/min (ref >60)
Glucose, Bld: 92 mg/dL (ref 70–99)
Potassium: 4.3 mmol/L (ref 3.5–5.1)
Sodium: 140 mmol/L (ref 135–145)

## 2018-02-13 LAB — LIPID PANEL
CHOL/HDL RATIO: 2.6 ratio
CHOLESTEROL: 139 mg/dL (ref 0–200)
HDL: 54 mg/dL (ref >40)
LDL Cholesterol: 70 mg/dL (ref 0–99)
TRIGLYCERIDES: 75 mg/dL (ref <150)
VLDL: 15 mg/dL (ref 0–40)

## 2018-02-13 MED ORDER — METOPROLOL TARTRATE 12.5 MG HALF TABLET
12.5000 mg | ORAL_TABLET | Freq: Two times a day (BID) | ORAL | Status: DC
  Administered 2018-02-13: 12.5 mg via ORAL
  Filled 2018-02-13: qty 1

## 2018-02-13 MED ORDER — CLOPIDOGREL BISULFATE 75 MG PO TABS
75.0000 mg | ORAL_TABLET | Freq: Every day | ORAL | Status: DC
  Administered 2018-02-13: 75 mg via ORAL
  Filled 2018-02-13: qty 1

## 2018-02-13 NOTE — Discharge Instructions (Signed)
°  Heart healthy Diet, No driving for 1 week Call if any increase of pain at site.      Femoral Site Care Refer to this sheet in the next few weeks. These instructions provide you with information about caring for yourself after your procedure. Your health care provider may also give you more specific instructions. Your treatment has been planned according to current medical practices, but problems sometimes occur. Call your health care provider if you have any problems or questions after your procedure. What can I expect after the procedure? After your procedure, it is typical to have the following:  Bruising at the site that usually fades within 1-2 weeks.  Blood collecting in the tissue (hematoma) that may be painful to the touch. It should usually decrease in size and tenderness within 1-2 weeks.  Follow these instructions at home:  Take medicines only as directed by your health care provider.  You may shower 24-48 hours after the procedure or as directed by your health care provider. Remove the bandage (dressing) and gently wash the site with plain soap and water. Pat the area dry with a clean towel. Do not rub the site, because this may cause bleeding.  Do not take baths, swim, or use a hot tub until your health care provider approves.  Check your insertion site every day for redness, swelling, or drainage.  Do not apply powder or lotion to the site.  Limit use of stairs to twice a day for the first 2-3 days or as directed by your health care provider.  Do not squat for the first 2-3 days or as directed by your health care provider.  Do not lift over 10 lb (4.5 kg) for 5 days after your procedure or as directed by your health care provider.  Ask your health care provider when it is okay to: ? Return to work or school. ? Resume usual physical activities or sports. ? Resume sexual activity.  Do not drive home if you are discharged the same day as the procedure. Have someone  else drive you.  You may drive 24 hours after the procedure unless otherwise instructed by your health care provider.  Do not operate machinery or power tools for 24 hours after the procedure or as directed by your health care provider.  If your procedure was done as an outpatient procedure, which means that you went home the same day as your procedure, a responsible adult should be with you for the first 24 hours after you arrive home.  Keep all follow-up visits as directed by your health care provider. This is important. Contact a health care provider if:  You have a fever.  You have chills.  You have increased bleeding from the site. Hold pressure on the site. Get help right away if:  You have unusual pain at the site.  You have redness, warmth, or swelling at the site.  You have drainage (other than a small amount of blood on the dressing) from the site.  The site is bleeding, and the bleeding does not stop after 30 minutes of holding steady pressure on the site.  Your leg or foot becomes pale, cool, tingly, or numb. This information is not intended to replace advice given to you by your health care provider. Make sure you discuss any questions you have with your health care provider. Document Released: 01/26/2014 Document Revised: 10/31/2015 Document Reviewed: 12/12/2013 Elsevier Interactive Patient Education  [employer].

## 2018-02-13 NOTE — Progress Notes (Signed)
Progress Note  Patient Name: Brittany Mason Date of Encounter: 02/13/2018  Primary Cardiologist: Jenkins Rouge, MD   Subjective   Feeling well. Groin sore but improving.  Able to ambulate.  No chest pain or shortness of breath.  Inpatient Medications    Scheduled Meds: . sodium chloride   Intravenous Once  . aspirin EC  81 mg Oral Daily  . escitalopram  20 mg Oral q morning - 10a  . ezetimibe  10 mg Oral Daily  . fenofibrate  160 mg Oral QHS  . levothyroxine  137 mcg Oral QAC breakfast  . omega-3 acid ethyl esters  2 g Oral BID  . rOPINIRole  0.5 mg Oral QHS  . simvastatin  40 mg Oral QHS  . sodium chloride flush  3 mL Intravenous Q12H   Continuous Infusions: . sodium chloride    . sodium chloride Stopped (02/12/18 1214)  . DOPamine Stopped (02/12/18 0947)   PRN Meds: sodium chloride, acetaminophen, cyclobenzaprine, fentaNYL (SUBLIMAZE) injection, nitroGLYCERIN, ondansetron (ZOFRAN) IV, sodium chloride flush   Vital Signs    Vitals:   02/12/18 2043 02/12/18 2359 02/13/18 0408 02/13/18 0826  BP: (!) 111/56 120/66 117/81 (!) 130/58  Pulse: 76 70 68 65  Resp: 18 16 16    Temp: 98.9 F (37.2 C) 99.8 F (37.7 C) 98.4 F (36.9 C) 98 F (36.7 C)  TempSrc: Oral Oral Oral Oral  SpO2: 94%  94% 96%  Weight:      Height:        Intake/Output Summary (Last 24 hours) at 02/13/2018 1038 Last data filed at 02/13/2018 0600 Gross per 24 hour  Intake 783.29 ml  Output 1000 ml  Net -216.71 ml   Filed Weights   02/11/18 0957 02/12/18 0600  Weight: 79.4 kg 84.9 kg    Telemetry    Sinus rhythm.  PVCs.  4 beats NSVT. - Personally Reviewed  ECG    n/a - Personally Reviewed  Physical Exam   VS:  BP (!) 130/58 (BP Location: Left Arm)   Pulse 65   Temp 98 F (36.7 C) (Oral)   Resp 16   Ht 5\' 3"  (1.6 m)   Wt 84.9 kg   SpO2 96%   BMI 33.16 kg/m  , BMI Body mass index is 33.16 kg/m. GENERAL:  Well appearing HEENT: Pupils equal round and reactive, fundi not  visualized, oral mucosa unremarkable NECK:  No jugular venous distention, waveform within normal limits, carotid upstroke brisk and symmetric, no bruits LUNGS:  Clear to auscultation bilaterally HEART:  RRR.  PMI not displaced or sustained,S1 and S2 within normal limits, no S3, no S4, no clicks, no rubs, no murmurs ABD:  Flat, positive bowel sounds normal in frequency in pitch, no bruits, no rebound, no guarding, no midline pulsatile mass, no hepatomegaly, no splenomegaly EXT:  2 plus pulses throughout, no edema, no cyanosis no clubbing.  R groin tender.  No hematoma.  Minimal ecchymosis.  SKIN:  No rashes no nodules NEURO:  Cranial nerves II through XII grossly intact, motor grossly intact throughout [facility]:  Cognitively intact, oriented to person place and time   Labs    Chemistry Recent Labs  Lab 02/08/18 0904 02/12/18 0254 02/13/18 0937  NA 141 142 140  K 4.4 3.7 4.3  CL 106 109 110  CO2 24 26 19*  GLUCOSE 92 107* 92  BUN 11 5* 11  CREATININE 0.80 0.73 0.76  CALCIUM 9.3 8.3* 8.9  GFRNONAA 78 >60 >60  GFRAA  90 >60 >60  ANIONGAP  --  7 11     Hematology Recent Labs  Lab 02/11/18 1612 02/12/18 0254 02/13/18 0937  WBC 10.0 6.7 7.8  RBC 4.62 4.19 4.60  HGB 13.4 12.1 13.6  HCT 42.1 37.3 42.0  MCV 91.1 89.0 91.3  MCH 29.0 28.9 29.6  MCHC 31.8 32.4 32.4  RDW 13.8 13.7 14.0  PLT 226 186 190    Cardiac EnzymesNo results for input(s): TROPONINI in the last 168 hours. No results for input(s): TROPIPOC in the last 168 hours.   BNPNo results for input(s): BNP, PROBNP in the last 168 hours.   DDimer No results for input(s): DDIMER in the last 168 hours.   Radiology    Ct Abdomen Pelvis Wo Contrast  Result Date: 02/11/2018 CLINICAL DATA:  Hypotension status post catheterization. EXAM: CT ABDOMEN AND PELVIS WITHOUT CONTRAST TECHNIQUE: Multidetector CT imaging of the abdomen and pelvis was performed following the standard protocol without IV contrast. COMPARISON:   11/20/2015 abdominal radiograph. FINDINGS: Lower chest: No significant pulmonary nodules or acute consolidative airspace disease. Coronary atherosclerosis. Hepatobiliary: Normal liver size. No liver mass. Normal gallbladder with no radiopaque cholelithiasis. No biliary ductal dilatation. Pancreas: Normal, with no mass or duct dilation. Spleen: Normal size. No mass. Adrenals/Urinary Tract: Normal adrenals. Normal kidneys with no hydronephrosis and no renal masses. Excreted contrast is noted within the renal collecting systems, ureters and bladder. Normal bladder. Stomach/Bowel: Small hiatal hernia. Otherwise normal nondistended stomach. Normal caliber small bowel with no small bowel wall thickening. Normal appendix. Minimal sigmoid diverticulosis, with no large bowel wall thickening or significant pericolonic fat stranding. Vascular/Lymphatic: Atherosclerotic nonaneurysmal abdominal aorta. No pathologically enlarged lymph nodes in the abdomen or pelvis. Reproductive: Status post hysterectomy, with no abnormal findings at the vaginal cuff. No adnexal mass. Other: No pneumoperitoneum. There is a large intramuscular hematoma in the right lateral abdominal muscle wall measuring 9.4 x 4.4 x 15.0 cm (series 3/image 47), which extends into the ventral right pelvic muscle wall. There are smaller right retroperitoneal hematomas measuring 3.4 x 1.7 x 3.0 cm at the level of right iliac crest (series 3/image 56) and measuring 4.1 x 2.0 x 4.2 cm anterior to the right external iliac vessels (series 3/image 64). There is subcutaneous scattered hemorrhage in the right inguinal region. Musculoskeletal: No aggressive appearing focal osseous lesions. Marked degenerative disc disease at L5-S1. IMPRESSION: 1. Large intramuscular hematoma in the right lateral abdominal muscle wall extending inferiorly into the ventral right pelvic muscle wall. 2. Smaller right retroperitoneal hematoma as described. 3. No intraperitoneal collections. 4.  Small hiatal hernia. 5. Minimal sigmoid diverticulosis. 6.  Aortic Atherosclerosis (ICD10-I70.0).  Coronary atherosclerosis. These results will be called to the ordering clinician or representative by the Radiologist Assistant, and communication documented in the PACS or zVision Dashboard. Electronically Signed   By: Ilona Sorrel M.D.   On: 02/11/2018 16:03    Cardiac Studies   LHC 02/11/18: 1. Moderate 2 vessel CAD involving the LCx and RCA and chronic occlusion of the distal LCx collateralized from L--->L collaterals 2. Patent left main and LAD with mild nonobstructive disease 3. Low LVEDP 4. Negative FFR of the left circumflex  Recommend: continued medical therapy. There is progressive stenosis of the right PDA but this vessel is diffusely diseased and not favorable for PCI.  Procedure complicated by hypotension after hemostasis is obtained. 0.5 mg atropine and IV fluids are administered. Groin site is tender into the right lower quadrant. Plan stat non-contrast CT.  Echo  04/10/16: Study Conclusions  - Left ventricle: The cavity size was normal. Systolic function was   mildly reduced. The estimated ejection fraction was in the range   of 45% to 50%. Moderate hypokinesis of the   mid-apicalanterolateral and inferolateral myocardium; consistent   with infarction in the distribution of the left circumflex   coronary artery. Doppler parameters are consistent with abnormal   left ventricular relaxation (grade 1 diastolic dysfunction). - Aortic valve: There was trivial regurgitation. - Atrial septum: No defect or patent foramen ovale was identified.   Patient Profile     67 y.o. female with CAD, s/p VF arrest during dath 11/8113, chronic systolic and diastolic heart failure, PE on HRT, hypothyroidism here with RP bleed during cath.  Assessment & Plan    # Intramuscular hematoma: Large bleeds in the R lateral abdominal muscle into ventral R pelvic muscle and around the iliac crest and  iliac vessels.  Required 2 units of pRBCs.  H/h remains stable this AM and she is off dopamine.  Will resume clopidogrel today.  # CAD: # Hyperlipidemia: Presented for unstable angina.  Went for cath 02/11/18 and had moderate 2 vessel CAD in the LCx and RCA with chronic occlusion of the distal LCx with L-->L collaterals.  Medical therapy recommended and clopidogrel resumed 02/13/18.  Complicated by bleed.LDL 70 this admission.  Continue Zetia, simvastatin, fenofibrate, and fish oil.  We will resume her home metoprolol today.  Plan to restart isosorbide mononitrate as an outpatient at follow-up if her blood pressure remains stable.  # Chronic systolic and diastolic heart failure: LVEF 45-50%.  She is euvolemic.  Resume metoprolol as above.   For questions or updates, please contact [place] Please consult [email or web address] for contact info under        Signed, Skeet Latch, MD  02/13/2018, 10:38 AM

## 2018-02-13 NOTE — Discharge Summary (Signed)
Discharge Summary    Patient ID: KARELYN BRISBY MRN: [id number]; DOB: [birth year]  Admit date: 02/11/2018 Discharge date: 02/13/2018  Primary Care Provider: Hulan Fess, MD  Primary Cardiologist: Jenkins Rouge, MD  Primary Electrophysiologist:  None  Discharge Diagnoses    Principal Problem:   Angina pectoris [facility]) Active Problems:   Retroperitoneal hemorrhage complicating cardiac catheterization   Hypercholesterolemia   CAD in native artery   Acute blood loss anemia transfused 2 U PRBCs 02/11/18   Chronic combined systolic and diastolic HF (heart failure) (HCC)   Intramuscular hematoma  Allergies Allergies  Allergen Reactions  . Codeine Nausea And Vomiting and Other (See Comments)    Takes hydrocodone; if takes doses too close together, states "violently throws up"  . Erythromycin Hives and Itching    Diagnostic Studies/Procedures    Cardiac cath 02/11/18: . Moderate 2 vessel CAD involving the LCx and RCA and chronic occlusion of the distal LCx collateralized from L--->L collaterals 2. Patent left main and LAD with mild nonobstructive disease 3. Low LVEDP 4. Negative FFR of the left circumflex  Recommend: continued medical therapy. There is progressive stenosis of the right PDA but this vessel is diffusely diseased and not favorable for PCI.  Procedure complicated by hypotension after hemostasis is obtained. 0.5 mg atropine and IV fluids are administered. Groin site is tender into the right lower quadrant. Plan stat non-contrast CT.  _____________   History of Present Illness     67 year old female with hx of CAD including V fib arrest with cath in 2017, had DES stent to LCX  Into large bifurcating OM1at that time.  Distal Lcx was occluded with rt to lt collaterals.   Then cath in 2018 was stable with single vessel obstructive CAD. The stent in the proximal LCx into the first OM is widely patent. There is chronic total occlusion of the distal LCx following the OM. The  vessel is small and well collateralized. Unable to cross with wire previously.   On her last OV she has sharp chest pain with radiation to Rt side of neck and was relieved with NTG.    To evaluate after discussion with pt cardiac cath was planned.    Pt presented for cath and found stable disease with 2 vessel CAD of LCX and RCA and chronic occlusion of the distal Lcx with L to L collaterals. There was progressive stenosis of the right PDA but this vessel is diffusely diseased and not favorable for PCI.  With procedure she developed hypotension and given atropine and IV fluids, she had tenderness into Rt groin.  She went to stat non contrast CT.  Pt was admitted for further management.  With hypotension dopamine was started.    Hospital Course     Consultants: Vascular    CT of the abd with large intramuscular hematoma in Rt lateral abd muscle wall extending inf. Into the ventral Rt pelvic muscle wall.  Smaller Rt retroperitoneal hematoma.  Consult with Dr. Scot Dock for bleed obtained.  Pt had acute blood loss anemia and transfused 2 U PRBCs.    Pt stabilized and hgb 12.  She was able to ambulate with minimal pain. She was weaned off dopamine.     She has been seen and evaluated by Dr. Oval Linsey and found stable for discharge.  BB resumed today and plavix resumed today.  Will need to resume imdur as outpt if BP allows.  No driving for 1 week.  Will have her seen this week  in office.  _____________  Discharge Vitals Blood pressure (!) 118/46, pulse (!) 41, temperature 98.5 F (36.9 C), temperature source Oral, resp. rate 16, height 5\' 3"  (1.6 m), weight 84.9 kg, SpO2 98 %.  Filed Weights   02/11/18 0957 02/12/18 0600  Weight: 79.4 kg 84.9 kg    Labs & Radiologic Studies    CBC Recent Labs    02/12/18 0254 02/13/18 0937  WBC 6.7 7.8  HGB 12.1 13.6  HCT 37.3 42.0  MCV 89.0 91.3  PLT 186 852   Basic Metabolic Panel Recent Labs    02/12/18 0254 02/13/18 0937  NA 142 140  K 3.7  4.3  CL 109 110  CO2 26 19*  GLUCOSE 107* 92  BUN 5* 11  CREATININE 0.73 0.76  CALCIUM 8.3* 8.9   Liver Function Tests No results for input(s): AST, ALT, ALKPHOS, BILITOT, PROT, ALBUMIN in the last 72 hours. No results for input(s): LIPASE, AMYLASE in the last 72 hours. Cardiac Enzymes No results for input(s): CKTOTAL, CKMB, CKMBINDEX, TROPONINI in the last 72 hours. BNP Invalid input(s): POCBNP D-Dimer No results for input(s): DDIMER in the last 72 hours. Hemoglobin A1C No results for input(s): HGBA1C in the last 72 hours. Fasting Lipid Panel Recent Labs    02/13/18 0450  CHOL 139  HDL 54  LDLCALC 70  TRIG 75  CHOLHDL 2.6   Thyroid Function Tests No results for input(s): TSH, T4TOTAL, T3FREE, THYROIDAB in the last 72 hours.  Invalid input(s): FREET3 _____________  Ct Abdomen Pelvis Wo Contrast  Result Date: 02/11/2018 CLINICAL DATA:  Hypotension status post catheterization. EXAM: CT ABDOMEN AND PELVIS WITHOUT CONTRAST TECHNIQUE: Multidetector CT imaging of the abdomen and pelvis was performed following the standard protocol without IV contrast. COMPARISON:  11/20/2015 abdominal radiograph. FINDINGS: Lower chest: No significant pulmonary nodules or acute consolidative airspace disease. Coronary atherosclerosis. Hepatobiliary: Normal liver size. No liver mass. Normal gallbladder with no radiopaque cholelithiasis. No biliary ductal dilatation. Pancreas: Normal, with no mass or duct dilation. Spleen: Normal size. No mass. Adrenals/Urinary Tract: Normal adrenals. Normal kidneys with no hydronephrosis and no renal masses. Excreted contrast is noted within the renal collecting systems, ureters and bladder. Normal bladder. Stomach/Bowel: Small hiatal hernia. Otherwise normal nondistended stomach. Normal caliber small bowel with no small bowel wall thickening. Normal appendix. Minimal sigmoid diverticulosis, with no large bowel wall thickening or significant pericolonic fat stranding.  Vascular/Lymphatic: Atherosclerotic nonaneurysmal abdominal aorta. No pathologically enlarged lymph nodes in the abdomen or pelvis. Reproductive: Status post hysterectomy, with no abnormal findings at the vaginal cuff. No adnexal mass. Other: No pneumoperitoneum. There is a large intramuscular hematoma in the right lateral abdominal muscle wall measuring 9.4 x 4.4 x 15.0 cm (series 3/image 47), which extends into the ventral right pelvic muscle wall. There are smaller right retroperitoneal hematomas measuring 3.4 x 1.7 x 3.0 cm at the level of right iliac crest (series 3/image 56) and measuring 4.1 x 2.0 x 4.2 cm anterior to the right external iliac vessels (series 3/image 64). There is subcutaneous scattered hemorrhage in the right inguinal region. Musculoskeletal: No aggressive appearing focal osseous lesions. Marked degenerative disc disease at L5-S1. IMPRESSION: 1. Large intramuscular hematoma in the right lateral abdominal muscle wall extending inferiorly into the ventral right pelvic muscle wall. 2. Smaller right retroperitoneal hematoma as described. 3. No intraperitoneal collections. 4. Small hiatal hernia. 5. Minimal sigmoid diverticulosis. 6.  Aortic Atherosclerosis (ICD10-I70.0).  Coronary atherosclerosis. These results will be called to the ordering clinician or representative  by the Radiologist Assistant, and communication documented in the PACS or zVision Dashboard. Electronically Signed   By: Ilona Sorrel M.D.   On: 02/11/2018 16:03   Disposition   Pt is being discharged home today in good condition.  Follow-up Plans & Appointments    Heart healthy Diet, No driving for 1 week Call if any increase of pain at site.     Follow-up Information    Josue Hector, MD Follow up.   Specialty:  Cardiology Why:  office should call Monday for follow up appt, with Dr. Johnsie Cancel or Truitt Merle or other APP.  if you have not heard by Tuesday morning call the office.  you will need blood work also.     Contact information: [address] [place] [address] [phone]            Discharge Medications   Allergies as of 02/13/2018      Reactions   Codeine Nausea And Vomiting, Other (See Comments)   Takes hydrocodone; if takes doses too close together, states "violently throws up"   Erythromycin Hives, Itching      Medication List    STOP taking these medications   isosorbide mononitrate 30 MG 24 hr tablet Commonly known as:  IMDUR     TAKE these medications   aspirin EC 81 MG tablet Take 81 mg by mouth daily with lunch.   butalbital-acetaminophen-caffeine 50-325-40 MG tablet Commonly known as:  FIORICET, ESGIC Take 1 tablet by mouth 2 (two) times daily as needed for headache.   clopidogrel 75 MG tablet Commonly known as:  PLAVIX Take 1 tablet (75 mg total) by mouth daily.   cyclobenzaprine 5 MG tablet Commonly known as:  FLEXERIL Take 5 mg by mouth 3 (three) times daily as needed for muscle spasms.   escitalopram 20 MG tablet Commonly known as:  LEXAPRO Take 20 mg by mouth every morning.   ezetimibe 10 MG tablet Commonly known as:  ZETIA Take 1 tablet (10 mg total) by mouth daily. Please keep upcoming appt in September with Dr. Johnsie Cancel for future refills. Thank you   fenofibrate 160 MG tablet Take 160 mg by mouth at bedtime.   levothyroxine 137 MCG tablet Commonly known as:  SYNTHROID, LEVOTHROID Take 137 mcg by mouth daily before breakfast.   metoprolol tartrate 25 MG tablet Commonly known as:  LOPRESSOR TAKE 1/2 TABLET BY MOUTH 2 TIMES DAILY. What changed:  See the new instructions.   morphine 30 MG tablet Commonly known as:  MSIR Take 30 mg by mouth daily.   nitroGLYCERIN 0.4 MG SL tablet Commonly known as:  NITROSTAT Place 1 tablet (0.4 mg total) under the tongue every 5 (five) minutes as needed for chest pain.   omega-3 acid ethyl esters 1 g capsule Commonly known as:  LOVAZA Take 2 g by mouth 2 (two) times daily.    OVER THE COUNTER MEDICATION Take 2 capsules by mouth daily at 6 PM. "Relizen" herbal supplement for hormone replacement   rOPINIRole 0.5 MG tablet Commonly known as:  REQUIP Take 0.5 mg by mouth at bedtime.   scopolamine 1 MG/3DAYS Commonly known as:  TRANSDERM-SCOP Place 1 patch onto the skin as needed (for travel).   simvastatin 40 MG tablet Commonly known as:  ZOCOR Take 1 tablet (40 mg total) by mouth at bedtime. Please make yearly appt with Dr. Johnsie Cancel for September for future refills. 1st attempt   SYSTANE OP Place 1-2 drops into both eyes daily  as needed (for red/itchy eyes).        Acute coronary syndrome (MI, NSTEMI, STEMI, etc) this admission?: No.    Outstanding Labs/Studies   BMP and CBC week of 02/14/18  Duration of Discharge Encounter   Greater than 30 minutes including physician time.  Signed, Cecilie Kicks, NP 02/13/2018, 1:07 PM

## 2018-02-13 NOTE — Progress Notes (Signed)
   VASCULAR SURGERY ASSESSMENT & PLAN:   Minimal right groin discomfort.  Today's labs are pending.  Hopefully home today.  SUBJECTIVE:   Minimal discomfort right groin  PHYSICAL EXAM:   Vitals:   02/12/18 1610 02/12/18 2043 02/12/18 2359 02/13/18 0408  BP: (!) 117/58 (!) 111/56 120/66 117/81  Pulse: 61 76 70 68  Resp:  18 16 16   Temp: 99.2 F (37.3 C) 98.9 F (37.2 C) 99.8 F (37.7 C) 98.4 F (36.9 C)  TempSrc: Oral Oral Oral Oral  SpO2: 99% 94%  94%  Weight:      Height:       Right groin is soft.  Mild bruising.  LABS:   Lab Results  Component Value Date   WBC 6.7 02/12/2018   HGB 12.1 02/12/2018   HCT 37.3 02/12/2018   MCV 89.0 02/12/2018   PLT 186 02/12/2018   Lab Results  Component Value Date   CREATININE 0.73 02/12/2018   Lab Results  Component Value Date   INR 1.0 10/05/2016   CBG (last 3)  No results for input(s): GLUCAP in the last 72 hours.  PROBLEM LIST:    Active Problems:   Angina pectoris (HCC)   Retroperitoneal hemorrhage complicating cardiac catheterization   CURRENT MEDS:   . sodium chloride   Intravenous Once  . aspirin EC  81 mg Oral Daily  . escitalopram  20 mg Oral q morning - 10a  . ezetimibe  10 mg Oral Daily  . fenofibrate  160 mg Oral QHS  . levothyroxine  137 mcg Oral QAC breakfast  . omega-3 acid ethyl esters  2 g Oral BID  . rOPINIRole  0.5 mg Oral QHS  . simvastatin  40 mg Oral QHS  . sodium chloride flush  3 mL Intravenous Q12H    Brittany Mason Beeper: [phone] Office: [phone] 02/13/2018

## 2018-02-13 NOTE — Plan of Care (Signed)
  Problem: Education: Goal: Knowledge of General Education information will improve Description Including pain rating scale, medication(s)/side effects and non-pharmacologic comfort measures Outcome: Completed/Met   Problem: Health Behavior/Discharge Planning: Goal: Ability to manage health-related needs will improve Outcome: Completed/Met   Problem: Clinical Measurements: Goal: Ability to maintain clinical measurements within normal limits will improve Outcome: Completed/Met Goal: Will remain free from infection Outcome: Completed/Met Goal: Diagnostic test results will improve Outcome: Completed/Met Goal: Cardiovascular complication will be avoided Outcome: Completed/Met   Problem: Activity: Goal: Risk for activity intolerance will decrease Outcome: Completed/Met   Problem: Nutrition: Goal: Adequate nutrition will be maintained Outcome: Completed/Met   Problem: Coping: Goal: Level of anxiety will decrease Outcome: Completed/Met   Problem: Elimination: Goal: Will not experience complications related to bowel motility Outcome: Completed/Met   Problem: Pain Managment: Goal: General experience of comfort will improve Outcome: Completed/Met   Problem: Safety: Goal: Ability to remain free from injury will improve Outcome: Completed/Met   Problem: Skin Integrity: Goal: Risk for impaired skin integrity will decrease Outcome: Completed/Met   Problem: Education: Goal: Understanding of cardiac disease, CV risk reduction, and recovery process will improve Outcome: Completed/Met Goal: Individualized Educational Video(s) Outcome: Completed/Met   Problem: Activity: Goal: Ability to tolerate increased activity will improve Outcome: Completed/Met   Problem: Cardiac: Goal: Ability to achieve and maintain adequate cardiovascular perfusion will improve Outcome: Completed/Met   Problem: Health Behavior/Discharge Planning: Goal: Ability to safely manage health-related needs  after discharge will improve Outcome: Completed/Met

## 2018-02-14 ENCOUNTER — Encounter (HOSPITAL_COMMUNITY): Payer: Self-pay | Admitting: Cardiovascular Disease

## 2018-02-14 ENCOUNTER — Other Ambulatory Visit: Payer: Self-pay | Admitting: *Deleted

## 2018-02-14 MED FILL — Verapamil HCl IV Soln 2.5 MG/ML: INTRAVENOUS | Qty: 2 | Status: AC

## 2018-02-14 NOTE — Patient Outreach (Signed)
[facility]) Care Management  02/14/2018  Brittany Mason [birth year] [id number]   Subjective: Telephone call to patient's home  / mobile number, no answer, left HIPAA compliant voicemail message, and requested call back.    Objective: Per KPN (Knowledge Performance Now, point of care tool) and chart review, patient hospitalized 02/11/18 -02/13/18 for again.    Patient has a history of: cardiac arrest, Hypothyroidism, Cardiogenic shock, NSTEMI (non-ST elevated myocardial infarction), Acute respiratory failure with hypoxia, pulmonary embolism, AKI (acute kidney injury, Aspiration pneumonia, Hyperglycemia, Normocytic anemia, Restless legs syndrome, acute systolic heart failure, and Migraine headaches.  [facility] Care Management completed last transition of care follow up on 12/02/15.       Assessment: Received UMR Transition of care referral on 02/14/18. Transition of care follow up pending patient contact.     Plan: RNCM will send unsuccessful outreach  letter, [facility] pamphlet, will call patient for 2nd telephone outreach attempt, transition of care follow up, and proceed with case closure, within 10 business days if no return call.      Tyeasha Ebbs H. Annia Friendly, BSN, [place] Management [facility] Telephonic CM Phone: [phone] Fax: [phone]

## 2018-02-16 ENCOUNTER — Other Ambulatory Visit: Payer: Self-pay | Admitting: *Deleted

## 2018-02-16 NOTE — Patient Outreach (Signed)
[facility]) Care Management  02/16/2018  Brittany Mason 1950/09/06 [id number]   Subjective: Telephone call to patient's home  / mobile number, no answer, left HIPAA compliant voicemail message, and requested call back.    Objective: Per KPN (Knowledge Performance Now, point of care tool) and chart review, patient hospitalized 02/11/18 -02/13/18 for again.    Patient has a history of: cardiac arrest, Hypothyroidism, Cardiogenic shock, NSTEMI (non-ST elevated myocardial infarction), Acute respiratory failure with hypoxia, pulmonary embolism, AKI (acute kidney injury, Aspiration pneumonia, Hyperglycemia, Normocytic anemia, Restless legs syndrome, acute systolic heart failure, and Migraine headaches.  [facility] Care Management completed last transition of care follow up on 12/02/15.       Assessment:Received UMR Transition of care referral on 02/14/18. Transition of care follow up pending patient contact.     Plan:RNCM has sent unsuccessful outreach  letter, [facility] pamphlet, will call patient for 3rd telephone outreach attempt, transition of care follow up, and proceed with case closure, within 10 business days if no return call.     Pj Zehner H. Annia Friendly, BSN, [place] Management [facility] Telephonic CM Phone: [phone] Fax: [phone]

## 2018-02-17 ENCOUNTER — Encounter: Payer: Self-pay | Admitting: *Deleted

## 2018-02-17 ENCOUNTER — Telehealth: Payer: Self-pay | Admitting: Cardiology

## 2018-02-17 ENCOUNTER — Other Ambulatory Visit: Payer: Self-pay | Admitting: *Deleted

## 2018-02-17 NOTE — Patient Outreach (Signed)
[facility]) Care Management  02/17/2018  Brittany Mason 09-Jan-1951 [id number]   Subjective: Received voicemail from [name], states she is returning call, and requested call back.  Telephone call to patient's home / mobile number, spoke with patient, and HIPAA verified.  Discussed [facility] Care Management UMR Transition of care follow up, patient voiced understanding, and is in agreement to follow up.   Patient states she is doing okay, had a cardiac cath, had severe bleeding post procedure related to artery bleed, still very weak, very sore, MD aware, and MD stated this (her symptoms) are to be expected.  Patient aware of signs/ symptoms to report to MD and states she will follow up as needed.   States she has a follow up appointment with cardiologist on 02/23/18.   Patient states she is able to manage self care and has assistance as needed.  Patient voices understanding of medical diagnosis and treatment plan.  States she is accessing the following [facility] benefits: outpatient pharmacy, hospital indemnity (verbally given contact number for Teena Dunk [phone], will file claim if appropriate, verbally given contact number for [employer] [phone] to request itemized bill), and will call [employer] (verbally given contact number for [employer] [phone]), today to start family medical leave act (FMLA) process.  Patient states she does not have any education material, transition of care, care coordination, disease management, disease monitoring, transportation, community resource, or pharmacy needs at this time. States she is very appreciative of the follow up and is in agreement to receive [place] Management information.      Objective:Per KPN (Knowledge Performance Now, point of care tool) and chart review,patient hospitalized9/6/19 -02/13/18 for again.Patient has a history of: cardiac arrest,Hypothyroidism, Cardiogenic shock, NSTEMI (non-ST elevated myocardial  infarction), Acute respiratory failure with hypoxia, pulmonary embolism, AKI (acute kidney injury, Aspiration pneumonia, Hyperglycemia, Normocytic anemia, Restless legs syndrome, acute systolic heart failure, and Migraine headaches. [facility] Care Management completed last transition of care follow up on 12/02/15.     Assessment:Received UMR Transition of care referral on 02/14/18. Transition of care follow up completed, no care management needs, and will proceed with case closure.      Plan:RNCM will send patient successful outreach letter, [facility] pamphlet, and magnet. RNCM will complete case closure due to follow up completed / no care management needs.      Donnelle Olmeda H. Annia Friendly, BSN, [place] Management [facility] Telephonic CM Phone: [phone] Fax: [phone]

## 2018-02-17 NOTE — Telephone Encounter (Signed)
New Message   TOC appt with Ellen Henri 09/18

## 2018-02-18 NOTE — Telephone Encounter (Signed)
Patient contacted regarding discharge from [facility] on 02/13/18.  Patient understands to follow up with provider Ellen Henri PA-C on 02/23/18 at 1030 at [facility] location. Patient understands discharge instructions? yes Patient understands medications and regiment? yes Patient understands to bring all medications to this visit? yes  Pt had no further questions at this time.  Pt appreciative for the call.

## 2018-02-23 ENCOUNTER — Ambulatory Visit: Payer: 59 | Admitting: Cardiology

## 2018-02-23 ENCOUNTER — Encounter: Payer: Self-pay | Admitting: Cardiology

## 2018-02-23 VITALS — BP 124/62 | HR 64 | Ht 63.0 in | Wt 178.0 lb

## 2018-02-23 DIAGNOSIS — Z79899 Other long term (current) drug therapy: Secondary | ICD-10-CM | POA: Diagnosis not present

## 2018-02-23 DIAGNOSIS — I10 Essential (primary) hypertension: Secondary | ICD-10-CM

## 2018-02-23 DIAGNOSIS — D649 Anemia, unspecified: Secondary | ICD-10-CM | POA: Diagnosis not present

## 2018-02-23 LAB — CBC WITH DIFFERENTIAL/PLATELET
BASOS: 1 %
Basophils Absolute: 0.1 10*3/uL (ref 0.0–0.2)
EOS (ABSOLUTE): 0.1 10*3/uL (ref 0.0–0.4)
Eos: 2 %
HEMOGLOBIN: 13.6 g/dL (ref 11.1–15.9)
Hematocrit: 41.8 % (ref 34.0–46.6)
IMMATURE GRANS (ABS): 0 10*3/uL (ref 0.0–0.1)
Immature Granulocytes: 0 %
LYMPHS: 39 %
Lymphocytes Absolute: 2.8 10*3/uL (ref 0.7–3.1)
MCH: 29.2 pg (ref 26.6–33.0)
MCHC: 32.5 g/dL (ref 31.5–35.7)
MCV: 90 fL (ref 79–97)
MONOCYTES: 12 %
Monocytes Absolute: 0.8 10*3/uL (ref 0.1–0.9)
NEUTROS ABS: 3.4 10*3/uL (ref 1.4–7.0)
Neutrophils: 46 %
Platelets: 383 10*3/uL (ref 150–450)
RBC: 4.65 x10E6/uL (ref 3.77–5.28)
RDW: 14.5 % (ref 12.3–15.4)
WBC: 7.3 10*3/uL (ref 3.4–10.8)

## 2018-02-23 LAB — BASIC METABOLIC PANEL
BUN / CREAT RATIO: 17 (ref 12–28)
BUN: 15 mg/dL (ref 8–27)
CO2: 25 mmol/L (ref 20–29)
Calcium: 9.9 mg/dL (ref 8.7–10.3)
Chloride: 99 mmol/L (ref 96–106)
Creatinine, Ser: 0.87 mg/dL (ref 0.57–1.00)
GFR calc non Af Amer: 71 mL/min/{1.73_m2} (ref >59)
GFR, EST AFRICAN AMERICAN: 81 mL/min/{1.73_m2} (ref >59)
Glucose: 86 mg/dL (ref 65–99)
Potassium: 4.8 mmol/L (ref 3.5–5.2)
Sodium: 137 mmol/L (ref 134–144)

## 2018-02-23 MED ORDER — ISOSORBIDE MONONITRATE ER 30 MG PO TB24: 15.0000 mg | ORAL_TABLET | Freq: Every day | ORAL | 3 refills | Status: DC

## 2018-02-23 NOTE — Patient Instructions (Addendum)
Medication Instructions:  Your physician has recommended you make the following change in your medication:  1-START Imdur 15 mg by mouth daily.  Labwork: Your physician recommends that you have lab work today- CBC and BMET  Testing/Procedures: NONE  Follow-Up: Your physician wants you to follow-up in: 3 months with Dr. Johnsie Cancel.    If you need a refill on your cardiac medications before your next appointment, please call your pharmacy.    Cholesterol Cholesterol is a fat. Your body needs a small amount of cholesterol. Cholesterol (plaque) may build up in your blood vessels (arteries). That makes you more likely to have a heart attack or stroke. You cannot feel your cholesterol level. Having a blood test is the only way to find out if your level is high. Keep your test results. Work with your doctor to keep your cholesterol at a good level. What do the results mean?  Total cholesterol is how much cholesterol is in your blood.  LDL is bad cholesterol. This is the type that can build up. Try to have low LDL.  HDL is good cholesterol. It cleans your blood vessels and carries LDL away. Try to have high HDL.  Triglycerides are fat that the body can store or burn for energy. What are good levels of cholesterol?  Total cholesterol below 200.  LDL below 100 is good for people who have health risks. LDL below 70 is good for people who have very high risks.  HDL above 40 is good. It is best to have HDL of 60 or higher.  Triglycerides below 150. How can I lower my cholesterol? Diet Follow your diet program as told by your doctor.  Choose fish, white meat chicken, or [place] that is roasted or baked. Try not to eat red meat, fried foods, sausage, or lunch meats.  Eat lots of fresh fruits and vegetables.  Choose whole grains, beans, pasta, potatoes, and cereals.  Choose olive oil, corn oil, or canola oil. Only use small amounts.  Try not to eat butter, mayonnaise, shortening, or  palm kernel oils.  Try not to eat foods with trans fats.  Choose low-fat or nonfat dairy foods. ? Drink skim or nonfat milk. ? Eat low-fat or nonfat yogurt and cheeses. ? Try not to drink whole milk or cream. ? Try not to eat ice cream, egg yolks, or full-fat cheeses.  Healthy desserts include angel food cake, ginger snaps, animal crackers, hard candy, popsicles, and low-fat or nonfat frozen yogurt. Try not to eat pastries, cakes, pies, and cookies.  Exercise Follow your exercise program as told by your doctor.  Be more active. Try gardening, walking, and taking the stairs.  Ask your doctor about ways that you can be more active.  Medicine  Take over-the-counter and prescription medicines only as told by your doctor. This information is not intended to replace advice given to you by your health care provider. Make sure you discuss any questions you have with your health care provider. Document Released: 08/21/2008 Document Revised: 12/25/2015 Document Reviewed: 12/05/2015 Elsevier Interactive Patient Education  [employer].

## 2018-02-23 NOTE — Progress Notes (Signed)
02/23/2018 Brittany Mason   November 07, 1950  [id number]  Primary Physician Hulan Fess, MD Primary Cardiologist: Dr. Johnsie Cancel   Reason for Visit/CC: [facility] F/u s/p LHC complicated by retroperitoneal hematoma   HPI:  Brittany Mason is a 67 y.o. female who is being seen today for post hospital f/u. She has a h/o CAD including V fib arrest with cath in 2017. She had DES stent to LCX into a large bifurcating OM1at that time.  The distal Lcx was occluded with rt to lt collaterals. She had repeat cardiac cath again in 2018 that showed patent stents and stable disease.   Pt was admitted again 02/11/18 for repeat LHC given recurrent CP. Cath showed moderate 2 vessel CAD involving the LCx and RCA and chronic occlusion of the distal LCx collateralized from L--->L collaterals, patent left main and LAD with mild nonobstructive disease, Low LVEDP and negative FFR of the left circumflex. There was progressive stenosis of the right PDA but this vessel is diffusely diseased and not favorable for PCI. Medical therapy was elected.   During post cath recovery, pt became hypotensive and found to have a retroperitoneal hematoma. Pressure was held over femoral arteriotomy and right lower abdomen by cath lab staff.  She was started on dopamine and IV fluid resuscitation and recieved transfusion w/ 2 units PRBCs. Vascular surgery consulted and pt seen by Dr Scot Dock. On his assessment, the pt had stabilized w/o any signs of active bleeding. Her Hgb was monitored closely and remained stable at 12. Dr. Scot Dock monitored closely but ultimately decided there was no need for surgical intervention. Condition improved and pt was felt stable by Vascular surgery, as well as by general cardiology for discharge home. Plavix was resumed. Plan was to try to restart Imdur as outpatient if BP would allow.   Today in f/u, she reports that she is progressing fairly well. She denies any CP since discharge. No dyspnea. Her right femoral  cath access site remains sore but gradually improving. BP is 124/62.   Cardiac Studies  LHC 02/11/18 Procedures   INTRAVASCULAR PRESSURE WIRE/FFR STUDY  LEFT HEART CATH AND CORONARY ANGIOGRAPHY  Conclusion   1. Moderate 2 vessel CAD involving the LCx and RCA and chronic occlusion of the distal LCx collateralized from L--->L collaterals 2. Patent left main and LAD with mild nonobstructive disease 3. Low LVEDP 4. Negative FFR of the left circumflex  Recommend: continued medical therapy. There is progressive stenosis of the right PDA but this vessel is diffusely diseased and not favorable for PCI.    Current Meds  Medication Sig  . aspirin EC 81 MG tablet Take 81 mg by mouth daily with lunch.  . butalbital-acetaminophen-caffeine (FIORICET, ESGIC) 50-325-40 MG tablet Take 1 tablet by mouth 2 (two) times daily as needed for headache.  . clopidogrel (PLAVIX) 75 MG tablet Take 1 tablet (75 mg total) by mouth daily.  . cyclobenzaprine (FLEXERIL) 5 MG tablet Take 5 mg by mouth 3 (three) times daily as needed for muscle spasms.  Marland Kitchen escitalopram (LEXAPRO) 20 MG tablet Take 20 mg by mouth every morning.   . ezetimibe (ZETIA) 10 MG tablet Take 1 tablet (10 mg total) by mouth daily. Please keep upcoming appt in September with Dr. Johnsie Cancel for future refills. Thank you  . fenofibrate 160 MG tablet Take 160 mg by mouth at bedtime.   Marland Kitchen levothyroxine (SYNTHROID, LEVOTHROID) 137 MCG tablet Take 137 mcg by mouth daily before breakfast.   . metoprolol tartrate (LOPRESSOR) 25  MG tablet TAKE 1/2 TABLET BY MOUTH 2 TIMES DAILY.  Marland Kitchen morphine (MSIR) 30 MG tablet Take 30 mg by mouth daily.   . nitroGLYCERIN (NITROSTAT) 0.4 MG SL tablet Place 1 tablet (0.4 mg total) under the tongue every 5 (five) minutes as needed for chest pain.  Marland Kitchen omega-3 acid ethyl esters (LOVAZA) 1 G capsule Take 2 g by mouth 2 (two) times daily.  Marland Kitchen OVER THE COUNTER MEDICATION Take 2 capsules by mouth daily at 6 PM. "Relizen" herbal supplement  for hormone replacement   . Polyethyl Glycol-Propyl Glycol (SYSTANE OP) Place 1-2 drops into both eyes daily as needed (for red/itchy eyes).   Marland Kitchen rOPINIRole (REQUIP) 0.5 MG tablet Take 0.5 mg by mouth at bedtime.   Marland Kitchen scopolamine (TRANSDERM-SCOP) 1 MG/3DAYS Place 1 patch onto the skin as needed (for travel).  . simvastatin (ZOCOR) 40 MG tablet Take 1 tablet (40 mg total) by mouth at bedtime. Please make yearly appt with Dr. Johnsie Cancel for September for future refills. 1st attempt   Allergies  Allergen Reactions  . Codeine Nausea And Vomiting and Other (See Comments)    Takes hydrocodone; if takes doses too close together, states "violently throws up"  . Erythromycin Hives and Itching   Past Medical History:  Diagnosis Date  . Acute blood loss anemia transfused 2 U PRBCs 02/11/18 02/13/2018  . Acute pulmonary embolism ([place]) 06/20/2015  . Arthritis    osteoarthritis-knees. Chronic back pain  . Basal cell carcinoma of right shoulder    "burned off"  . Chronic combined systolic and diastolic HF (heart failure) ([place]) 02/13/2018  . Chronic SI joint pain   . DVT (deep venous thrombosis) ([place]) 04/2013   left lower leg, resulted in Pulmonary emboli on hormone therapy  . Dyspnea on exertion   . GERD (gastroesophageal reflux disease)   . Hypercholesterolemia   . Hypothyroidism   . Intramuscular hematoma 02/13/2018  . Migraine    "< once/month" (12/04/2015)  . MVP (mitral valve prolapse)    asymptomatic  . Myocardial infarction ([place]) 11/19/2015   cardiac arrest  . PE (pulmonary embolism) Apr 18, 2013   tx. -using Xarelto now, no further lung problems, denies SOB on 01-02-14  . PONV (postoperative nausea and vomiting)   . RLS (restless legs syndrome)    Family History  Problem Relation Age of Onset  . Hypertension Mother   . Congestive Heart Failure Mother   . Coronary artery disease Father   . Hypertension Father   . Stroke Brother        DIED AT 106  . Cancer Maternal Aunt        lung, colon  .  Breast cancer Maternal Aunt   . Breast cancer Paternal Aunt    Past Surgical History:  Procedure Laterality Date  . ANTERIOR CRUCIATE LIGAMENT REPAIR Left 1976; 1981   "open"  . CARDIAC CATHETERIZATION N/A 11/20/2015   Procedure: Left Heart Cath and Coronary Angiography;  Surgeon: Belva Crome, MD;  Location: [facility];  Service: Cardiovascular;  Laterality: N/A;  . CARDIAC CATHETERIZATION N/A 12/04/2015   Procedure: Left Heart Cath and Coronary Angiography;  Surgeon: Peter M Martinique, MD;  Location: [facility];  Service: Cardiovascular;  Laterality: N/A;  . CARDIAC CATHETERIZATION  12/04/2015   Procedure: Coronary Stent Intervention;  Surgeon: Peter M Martinique, MD;  Location: [place] [facility];  Service: Cardiovascular;;  . COLONOSCOPY  09/28/2011   Procedure: COLONOSCOPY;  Surgeon: Juanita Craver, MD;  Location: [facility];  Service: Endoscopy;  Laterality: N/A;  . COLONOSCOPY WITH PROPOFOL N/A 01/05/2017   Procedure: COLONOSCOPY WITH PROPOFOL;  Surgeon: Juanita Craver, MD;  Location: [facility];  Service: Endoscopy;  Laterality: N/A;  . INTRAVASCULAR PRESSURE WIRE/FFR STUDY N/A 02/11/2018   Procedure: INTRAVASCULAR PRESSURE WIRE/FFR STUDY;  Surgeon: Sherren Mocha, MD;  Location: [facility];  Service: Cardiovascular;  Laterality: N/A;  . JOINT REPLACEMENT    . LEFT HEART CATH AND CORONARY ANGIOGRAPHY N/A 10/08/2016   Procedure: Left Heart Cath and Coronary Angiography;  Surgeon: Peter M Martinique, MD;  Location: [place];  Service: Cardiovascular;  Laterality: N/A;  . LEFT HEART CATH AND CORONARY ANGIOGRAPHY N/A 02/11/2018   Procedure: LEFT HEART CATH AND CORONARY ANGIOGRAPHY;  Surgeon: Sherren Mocha, MD;  Location: [facility];  Service: Cardiovascular;  Laterality: N/A;  . TOTAL ABDOMINAL HYSTERECTOMY  1998  . TOTAL KNEE ARTHROPLASTY Left 01/08/2014   Procedure: LEFT TOTAL KNEE ARTHROPLASTY;  Surgeon: Gearlean Alf, MD;  Location: [facility];  Service:  Orthopedics;  Laterality: Left;   Social History   Socioeconomic History  . Marital status: Single    Spouse name: Not on file  . Number of children: Not on file  . Years of education: Not on file  . Highest education level: Not on file  Occupational History  . Not on file  Social Needs  . Financial resource strain: Not very hard  . Food insecurity:    Worry: Never true    Inability: Never true  . Transportation needs:    Medical: No    Non-medical: No  Tobacco Use  . Smoking status: Never Smoker  . Smokeless tobacco: Never Used  Substance and Sexual Activity  . Alcohol use: No  . Drug use: No  . Sexual activity: Never    Birth control/protection: Surgical    Comment: HYSTERECTOMY  Lifestyle  . Physical activity:    Days per week: 3 days    Minutes per session: 30 min  . Stress: Only a little  Relationships  . Social connections:    Talks on phone: Three times a week    Gets together: Three times a week    Attends religious service: 1 to 4 times per year    Active member of club or organization: Yes    Attends meetings of clubs or organizations: 1 to 4 times per year    Relationship status: Never married  . Intimate partner violence:    Fear of current or ex partner: No    Emotionally abused: No    Physically abused: No    Forced sexual activity: No  Other Topics Concern  . Not on file  Social History Narrative   Lives with close friend.     Review of Systems: General: negative for chills, fever, night sweats or weight changes.  Cardiovascular: negative for chest pain, dyspnea on exertion, edema, orthopnea, palpitations, paroxysmal nocturnal dyspnea or shortness of breath Dermatological: negative for rash Respiratory: negative for cough or wheezing Urologic: negative for hematuria Abdominal: negative for nausea, vomiting, diarrhea, bright red blood per rectum, melena, or hematemesis Neurologic: negative for visual changes, syncope, or dizziness All other  systems reviewed and are otherwise negative except as noted above.   Physical Exam:  Blood pressure 124/62, pulse 64, height 5\' 3"  (1.6 m), weight 178 lb (80.7 kg), SpO2 93 %.  General appearance: alert, cooperative and no distress Neck: no carotid bruit and no JVD Lungs: clear to  auscultation bilaterally Heart: regular rate and rhythm, S1, S2 normal, no murmur, click, rub or gallop Extremities: extremities normal, atraumatic, no cyanosis or edema Pulses: 2+ and symmetric Skin: Skin color, texture, turgor normal. No rashes or lesions Neurologic: Grossly normal  EKG not performed -- personally reviewed   ASSESSMENT AND PLAN:   1. CAD: per above. Cath 02/11/18 showed moderate 2 vessel CAD in the LCx and RCA with chronic occlusion of the distal LCx with L-->L collaterals.  Medical therapy recommended. She is CP free. Continue ASA, Plavix, statin and BB. Restart Imdur but at lower dose of 15 mg daily.   2. Retroperitoneal hematoma: complication from femoral access cardiac catheterization. Hemostasis achieved via manual compression. Blood transfusion required + IVF resuscitation for acute blood loss anemia and hypotension. Condition stabilized and pt evaluated by Vascular surgery and felt no indication for surgical intervention. Pt continues to progress gradually. Groin is soft. We will check repeat CBC today to ensure H/H is stable. Pt advised to continue to refrain from heavy lifting and straining.   3. HTN: controlled on current regimen  4. HLD: recent lipid panel showed controlled LDL at 70 mg/dL. Continue simvastatin and zetia.    Follow-Up in 3 months with Dr. Johnsie Cancel.   Brittany Mason, MHS [facility] 02/23/2018 11:22 AM

## 2018-02-25 ENCOUNTER — Ambulatory Visit: Payer: 59 | Admitting: Cardiology

## 2018-03-16 ENCOUNTER — Other Ambulatory Visit: Payer: Self-pay | Admitting: Cardiovascular Disease

## 2018-03-16 DIAGNOSIS — R42 Dizziness and giddiness: Secondary | ICD-10-CM | POA: Diagnosis not present

## 2018-03-16 DIAGNOSIS — R110 Nausea: Secondary | ICD-10-CM | POA: Diagnosis not present

## 2018-03-16 MED FILL — ONDANSETRON ODT 4 MG TABLET: 4 | 5 days supply | Qty: 20 | Fill #0

## 2018-03-16 MED FILL — MECLIZINE 25 MG TABLET: 25 | 7 days supply | Qty: 30 | Fill #0

## 2018-03-16 MED FILL — ISOSORBIDE MN ER 30 MG TAB: 30 | 90 days supply | Qty: 45 | Fill #0

## 2018-03-16 MED FILL — CLOPIDOGREL 75 MG TABLET: 75 | 90 days supply | Qty: 90 | Fill #0

## 2018-03-24 DIAGNOSIS — G2581 Restless legs syndrome: Secondary | ICD-10-CM | POA: Diagnosis not present

## 2018-03-24 DIAGNOSIS — G894 Chronic pain syndrome: Secondary | ICD-10-CM | POA: Diagnosis not present

## 2018-03-24 DIAGNOSIS — G8929 Other chronic pain: Secondary | ICD-10-CM | POA: Diagnosis not present

## 2018-03-24 DIAGNOSIS — I959 Hypotension, unspecified: Secondary | ICD-10-CM | POA: Diagnosis not present

## 2018-03-24 MED FILL — MECLIZINE 25 MG TABLET: 25 | 7 days supply | Qty: 30 | Fill #1

## 2018-03-25 ENCOUNTER — Telehealth: Payer: Self-pay | Admitting: Cardiovascular Disease

## 2018-03-25 NOTE — Telephone Encounter (Signed)
New Message    Pt c/o BP issue:  1. What are your last 5 BP readings? 96/60 2. Are you having any other symptoms (ex. Dizziness, headache, blurred vision, passed out)? vertigo 3. What is your medication issue? No  Patient is calling because she has vertigo but her PCP feels that it may be do to her low BP. So she wants her to see cardiology. Please call to discuss.

## 2018-03-25 NOTE — Telephone Encounter (Signed)
Called patient about her message. Patient complaining of dizziness and lightheaded with a low BP. Patient stated her BP has been running 96/60, 110/65 and HR 50's. Asked patient to monitor her BP over the weekend. If her SBP is below 110 to hold her metoprolol. Made patient an appointment on Monday to see Cecilie Kicks NP. Asked patient to bring her BP reading in at that time. Encouraged patient to drink some fluids and call our office if her symptoms get worse.  Patient verbalized understanding.

## 2018-03-28 ENCOUNTER — Other Ambulatory Visit: Payer: Self-pay | Admitting: Cardiovascular Disease

## 2018-03-28 MED FILL — SIMVASTATIN 40 MG TABS: 40 | 90 days supply | Qty: 90 | Fill #0

## 2018-03-28 MED FILL — SYNTHROID 137 MCG TABLET: 137 | 90 days supply | Qty: 90 | Fill #3

## 2018-03-29 ENCOUNTER — Encounter: Payer: Self-pay | Admitting: Physician Assistant

## 2018-03-29 ENCOUNTER — Ambulatory Visit: Payer: 59 | Admitting: Physician Assistant

## 2018-03-29 VITALS — BP 104/68 | HR 74 | Ht 63.0 in | Wt 181.6 lb

## 2018-03-29 DIAGNOSIS — E785 Hyperlipidemia, unspecified: Secondary | ICD-10-CM | POA: Diagnosis not present

## 2018-03-29 DIAGNOSIS — T82837D Hemorrhage of cardiac prosthetic devices, implants and grafts, subsequent encounter: Secondary | ICD-10-CM

## 2018-03-29 DIAGNOSIS — I959 Hypotension, unspecified: Secondary | ICD-10-CM | POA: Diagnosis not present

## 2018-03-29 DIAGNOSIS — H93A9 Pulsatile tinnitus, unspecified ear: Secondary | ICD-10-CM | POA: Diagnosis not present

## 2018-03-29 DIAGNOSIS — I2510 Atherosclerotic heart disease of native coronary artery without angina pectoris: Secondary | ICD-10-CM | POA: Diagnosis not present

## 2018-03-29 DIAGNOSIS — R42 Dizziness and giddiness: Secondary | ICD-10-CM | POA: Diagnosis not present

## 2018-03-29 LAB — CBC
HEMATOCRIT: 42.4 % (ref 34.0–46.6)
HEMOGLOBIN: 14.4 g/dL (ref 11.1–15.9)
MCH: 29.4 pg (ref 26.6–33.0)
MCHC: 34 g/dL (ref 31.5–35.7)
MCV: 87 fL (ref 79–97)
Platelets: 266 10*3/uL (ref 150–450)
RBC: 4.89 x10E6/uL (ref 3.77–5.28)
RDW: 14.7 % (ref 12.3–15.4)
WBC: 6.2 10*3/uL (ref 3.4–10.8)

## 2018-03-29 LAB — BASIC METABOLIC PANEL
BUN/Creatinine Ratio: 23 (ref 12–28)
BUN: 18 mg/dL (ref 8–27)
CALCIUM: 9.8 mg/dL (ref 8.7–10.3)
CHLORIDE: 100 mmol/L (ref 96–106)
CO2: 26 mmol/L (ref 20–29)
Creatinine, Ser: 0.78 mg/dL (ref 0.57–1.00)
GFR, EST AFRICAN AMERICAN: 92 mL/min/{1.73_m2} (ref >59)
GFR, EST NON AFRICAN AMERICAN: 80 mL/min/{1.73_m2} (ref >59)
Glucose: 89 mg/dL (ref 65–99)
POTASSIUM: 4.8 mmol/L (ref 3.5–5.2)
Sodium: 136 mmol/L (ref 134–144)

## 2018-03-29 LAB — TSH: TSH: 4.49 u[IU]/mL (ref 0.450–4.500)

## 2018-03-29 MED FILL — MORPHINE SULF ER 30 MG TAB: 30 | 30 days supply | Qty: 60 | Fill #0

## 2018-03-29 NOTE — Patient Instructions (Addendum)
Medication Instructions:  Your physician recommends that you continue on your current medications as directed. Please refer to the Current Medication list given to you today.  If you need a refill on your cardiac medications before your next appointment, please call your pharmacy.   Lab work: TODAY:  STAT BMET & CBC TSH  If you have labs (blood work) drawn today and your tests are completely normal, you will receive your results only by: Marland Kitchen MyChart Message (if you have MyChart) OR . A paper copy in the mail If you have any lab test that is abnormal or we need to change your treatment, we will call you to review the results.  Testing/Procedures: Your physician has requested that you have a carotid duplex. This test is an ultrasound of the carotid arteries in your neck. It looks at blood flow through these arteries that supply the brain with blood. Allow one hour for this exam. There are no restrictions or special instructions.    Follow-Up: Your physician recommends that you schedule a follow-up appointment in: [address] [place]   Your physician recommends that you schedule a follow-up appointment in: [place] Johnsie Cancel  Any Other Special Instructions Will Be Listed Below (If Applicable).

## 2018-03-29 NOTE — Progress Notes (Signed)
Cardiology Office Note    Date:  03/29/2018  ID:  Brittany Mason, DOB May 09, 1951, MRN [id number] PCP:  Hulan Fess, MD  Cardiologist:  Jenkins Rouge, MD   Chief Complaint: dizziness  History of Present Illness:  Brittany Mason is a 67 y.o. female with history of CAD (V fib arrest 2017 s/p DES to LCx with occluded dCx, repeat cath 02/2018 treated medically), retroperitoneal hematoma 02/2018, ICM (previous EF 35-40% in 11/2015, improved to 50-55% by last assessment 10/2016), PVCs, DVT/PE 2014 (felt provoked by hormone therapy), migraines, RLS who presents for evaluation of dizziness.   She had VF arrest with cath in 2017. She had DES stent to LCX into a large bifurcating OM1at that time. The distal Lcx was occluded with Rl-L collaterals. She had repeat cardiac cath again in 2018 that showed patent stents and stable disease. More recently in 02/2018 she was admitted for recurrent CP. Cath showed moderate 2 vessel CAD involving the LCx and RCA and chronic occlusion of the distal LCx collateralized from L--->L collaterals, patent left main and LAD with mild nonobstructive disease, Low LVEDP and negative FFR of the left circumflex. There was progressive stenosis of the right PDA but this vessel is diffusely diseased and not favorable for PCI. Medical therapy was elected, but procedure was complicated by hypotension and retroperitoneal hematoma. She was started on dopamine and IV fluid resuscitation and recieved transfusion w/ 2 units PRBCs. Vascular surgery consulted and pt seen by Dr Scot Dock. On his assessment, the pt had stabilized with manual compression w/o any signs of active bleeding. Her Hgb was monitored closely and remained stable at 12. Dr. Scot Dock monitored closely but ultimately decided there was no need for surgical intervention. Condition improved and pt was felt stable by Vascular surgery, as well as by general cardiology for discharge home. Plavix was resumed. Plan was to try to restart  Imdur as outpatient if BP would allow. At f/u 02/23/18 she was feeling better. Imdur was restarted at lower dose of 15mg  daily. Last labs 02/23/18 showed Cr 0.87, K 4.8, Hgb of 13.6, most recent LDL 70.   She presents back to clinic today for evaluation of dizziness. For the past week she has noticed a constant dizziness she describes as vertigo, regardless of head or body position. She does not feel lightheaded or presyncopal. She has had a dull headache on the top of her head with this. No visual changes or slurred speech. She saw PCP 10/17 and BP was noted to be 96/50 so metoprolol was stopped. She did not notice a significant difference in symptoms with stopping this, but BP came back up into the mid 100s range in the office. She has gotten home BP readings in the 120s-130s as well. She does notice significant improvement in dizziness when she takes meclizine. She also has begun getting car sick if being a passenger in the car so has been taking Zofran PRN as well. The only other change is about a week before symptoms started her MSO4 was refilled as IR rather than extended release, but she states her PCP did not seem to think this was the cause. He returns to the office this week and she will be having him correct the rx. She still feels comfortable driving and walking. No falls, dyspnea, palpitations. She gets rare CP relieved with NTG which is stable for her, no change except actually improved somewhat since starting Imdur. She also has noticed hearing her heartbeat in her right ear when  she lays down to sleep since stopping the metoprolol. She does not hear it all the time. No neck pain or TIA sx. She works in Engineer, technical sales at [employer].   Past Medical History:  Diagnosis Date  . Acute pulmonary embolism ([place]) 06/20/2015  . Arthritis    osteoarthritis-knees. Chronic back pain  . Basal cell carcinoma of right shoulder    "burned off"  . CAD in native artery    a. V fib arrest 2017 s/p DES to LCx with occluded dCx.  b. repeat cath 02/2018 treated medically (see report) - c/b RPH.  . Chronic combined systolic and diastolic HF (heart failure) ([place]) 02/13/2018  . Chronic SI joint pain   . DVT (deep venous thrombosis) ([place]) 04/2013   left lower leg, resulted in Pulmonary emboli on hormone therapy  . Dyspnea on exertion   . GERD (gastroesophageal reflux disease)   . Hypercholesterolemia   . Hypothyroidism   . Intramuscular hematoma 02/13/2018  . Migraine    "< once/month" (12/04/2015)  . MVP (mitral valve prolapse)    asymptomatic  . Myocardial infarction ([place]) 11/19/2015   cardiac arrest  . PE (pulmonary embolism) Apr 18, 2013   tx. -using Xarelto now, no further lung problems, denies SOB on 01-02-14  . PONV (postoperative nausea and vomiting)   . Retroperitoneal hematoma   . RLS (restless legs syndrome)     Past Surgical History:  Procedure Laterality Date  . ANTERIOR CRUCIATE LIGAMENT REPAIR Left 1976; 1981   "open"  . CARDIAC CATHETERIZATION N/A 11/20/2015   Procedure: Left Heart Cath and Coronary Angiography;  Surgeon: Belva Crome, MD;  Location: [facility];  Service: Cardiovascular;  Laterality: N/A;  . CARDIAC CATHETERIZATION N/A 12/04/2015   Procedure: Left Heart Cath and Coronary Angiography;  Surgeon: Peter M Martinique, MD;  Location: [place] [facility];  Service: Cardiovascular;  Laterality: N/A;  . CARDIAC CATHETERIZATION  12/04/2015   Procedure: Coronary Stent Intervention;  Surgeon: Peter M Martinique, MD;  Location: [place] [facility];  Service: Cardiovascular;;  . COLONOSCOPY  09/28/2011   Procedure: COLONOSCOPY;  Surgeon: Juanita Craver, MD;  Location: [facility];  Service: Endoscopy;  Laterality: N/A;  . COLONOSCOPY WITH PROPOFOL N/A 01/05/2017   Procedure: COLONOSCOPY WITH PROPOFOL;  Surgeon: Juanita Craver, MD;  Location: [facility];  Service: Endoscopy;  Laterality: N/A;  . INTRAVASCULAR PRESSURE WIRE/FFR STUDY N/A 02/11/2018   Procedure: INTRAVASCULAR PRESSURE WIRE/FFR STUDY;  Surgeon:  Sherren Mocha, MD;  Location: [facility];  Service: Cardiovascular;  Laterality: N/A;  . JOINT REPLACEMENT    . LEFT HEART CATH AND CORONARY ANGIOGRAPHY N/A 10/08/2016   Procedure: Left Heart Cath and Coronary Angiography;  Surgeon: Peter M Martinique, MD;  Location: [facility];  Service: Cardiovascular;  Laterality: N/A;  . LEFT HEART CATH AND CORONARY ANGIOGRAPHY N/A 02/11/2018   Procedure: LEFT HEART CATH AND CORONARY ANGIOGRAPHY;  Surgeon: Sherren Mocha, MD;  Location: [facility];  Service: Cardiovascular;  Laterality: N/A;  . TOTAL ABDOMINAL HYSTERECTOMY  1998  . TOTAL KNEE ARTHROPLASTY Left 01/08/2014   Procedure: LEFT TOTAL KNEE ARTHROPLASTY;  Surgeon: Gearlean Alf, MD;  Location: [facility];  Service: Orthopedics;  Laterality: Left;    Current Medications: Current Meds  Medication Sig  . aspirin EC 81 MG tablet Take 81 mg by mouth daily with lunch.  . butalbital-acetaminophen-caffeine (FIORICET, ESGIC) 50-325-40 MG tablet Take 1 tablet by mouth 2 (two) times daily as needed for headache.  Marland Kitchen  clopidogrel (PLAVIX) 75 MG tablet TAKE 1 TABLET BY MOUTH ONCE DAILY  . cyclobenzaprine (FLEXERIL) 5 MG tablet Take 5 mg by mouth 3 (three) times daily as needed for muscle spasms.  Marland Kitchen escitalopram (LEXAPRO) 20 MG tablet Take 20 mg by mouth every morning.   . ezetimibe (ZETIA) 10 MG tablet Take 1 tablet (10 mg total) by mouth daily. Please keep upcoming appt in September with Dr. Johnsie Cancel for future refills. Thank you  . fenofibrate 160 MG tablet Take 160 mg by mouth at bedtime.   . isosorbide mononitrate (IMDUR) 30 MG 24 hr tablet Take 0.5 tablets (15 mg total) by mouth daily.  Marland Kitchen levothyroxine (SYNTHROID, LEVOTHROID) 137 MCG tablet Take 137 mcg by mouth daily before breakfast.   . meclizine (ANTIVERT) 25 MG tablet Take 25 mg by mouth 3 (three) times daily as needed for dizziness.  Marland Kitchen morphine (MSIR) 30 MG tablet Take 30 mg by mouth daily.   . nitroGLYCERIN (NITROSTAT) 0.4 MG SL tablet  Place 1 tablet (0.4 mg total) under the tongue every 5 (five) minutes as needed for chest pain.  Marland Kitchen omega-3 acid ethyl esters (LOVAZA) 1 G capsule Take 2 g by mouth 2 (two) times daily.  . ondansetron (ZOFRAN) 4 MG tablet Take 4 mg by mouth every 8 (eight) hours as needed for nausea or vomiting.  Marland Kitchen OVER THE COUNTER MEDICATION Take 2 capsules by mouth daily at 6 PM. "Relizen" herbal supplement for hormone replacement   . Polyethyl Glycol-Propyl Glycol (SYSTANE OP) Place 1-2 drops into both eyes daily as needed (for red/itchy eyes).   Marland Kitchen rOPINIRole (REQUIP) 0.5 MG tablet Take 0.5 mg by mouth at bedtime.   Marland Kitchen scopolamine (TRANSDERM-SCOP) 1 MG/3DAYS Place 1 patch onto the skin as needed (for travel).  . simvastatin (ZOCOR) 40 MG tablet Take 1 tablet (40 mg total) by mouth daily at 6 PM.      Allergies:   Codeine and Erythromycin   Social History   Socioeconomic History  . Marital status: Single    Spouse name: Not on file  . Number of children: Not on file  . Years of education: Not on file  . Highest education level: Not on file  Occupational History  . Not on file  Social Needs  . Financial resource strain: Not very hard  . Food insecurity:    Worry: Never true    Inability: Never true  . Transportation needs:    Medical: No    Non-medical: No  Tobacco Use  . Smoking status: Never Smoker  . Smokeless tobacco: Never Used  Substance and Sexual Activity  . Alcohol use: No  . Drug use: No  . Sexual activity: Never    Birth control/protection: Surgical    Comment: HYSTERECTOMY  Lifestyle  . Physical activity:    Days per week: 3 days    Minutes per session: 30 min  . Stress: Only a little  Relationships  . Social connections:    Talks on phone: Three times a week    Gets together: Three times a week    Attends religious service: 1 to 4 times per year    Active member of club or organization: Yes    Attends meetings of clubs or organizations: 1 to 4 times per year     Relationship status: Never married  Other Topics Concern  . Not on file  Social History Narrative   Lives with close friend.     Family History:  [name] family  history includes Breast cancer in her maternal aunt and paternal aunt; Cancer in her maternal aunt; Congestive Heart Failure in her mother; Coronary artery disease in her father; Hypertension in her father and mother; Stroke in her brother.  ROS:   Please see the history of present illness.  All other systems are reviewed and otherwise negative.    PHYSICAL EXAM:   VS:  BP 104/68   Pulse 74   Ht 5\' 3"  (1.6 m)   Wt 181 lb 9.6 oz (82.4 kg)   SpO2 97%   BMI 32.17 kg/m   BMI: Body mass index is 32.17 kg/m. GEN: Well nourished, well developed WF in no acute distress HEENT: normocephalic, atraumatic Neck: no JVD, carotid bruits, or masses Cardiac: RRR rare ectopy; no murmurs, rubs, or gallops, no edema  Respiratory:  clear to auscultation bilaterally, normal work of breathing GI: soft, nontender, nondistended, + BS MS: no deformity or atrophy Skin: warm and dry, no rash, right groin cath site without hematoma, ecchymosis, or bruit. Neuro:  Alert and Oriented x 3, Strength and sensation are intact, follows commands Psych: euthymic mood, full affect  Wt Readings from Last 3 Encounters:  03/29/18 181 lb 9.6 oz (82.4 kg)  02/23/18 178 lb (80.7 kg)  02/12/18 187 lb 2.7 oz (84.9 kg)      Studies/Labs Reviewed:   EKG:  EKG was ordered today and personally reviewed by me and demonstrates NSR 66bpm no acute changes, QTc 446bpm  Recent Labs: 02/23/2018: BUN 15; Creatinine, Ser 0.87; Hemoglobin 13.6; Platelets 383; Potassium 4.8; Sodium 137   Lipid Panel    Component Value Date/Time   CHOL 139 02/13/2018 0450   CHOL 138 11/18/2016 0859   TRIG 75 02/13/2018 0450   HDL 54 02/13/2018 0450   HDL 56 11/18/2016 0859   CHOLHDL 2.6 02/13/2018 0450   VLDL 15 02/13/2018 0450   LDLCALC 70 02/13/2018 0450   LDLCALC 66  11/18/2016 0859    Additional studies/ records that were reviewed today include: Summarized above.   ASSESSMENT & PLAN:   1. Hypotension/dizziness - etiology not immediately apparent, wide differential. BP is low normal but has been similar to this in the past as well. HR is controlled without significant ectopy. Symptoms did not really change with d/c of metoprolol but BP has improved from the 90s back to baseline 100s. Meclizine relieves the symptoms the most which favors that vertigo is more likely the cause. Whether this is precipitated by her recent change in morphine is unclear to me; will defer to primary care. Nevertheless, needs labs to r/o obvious metabolic cause including anemia or dehydration. Will obtain today. The pulsatile tinnitus is also somewhat unusual as a sequelae of stopping metoprolol so will also obtain carotid duplex to exclude abnormality. If her dizziness or headache persist, primary care may need to undertake further brain/vascular imaging to rule out intracranial cause of symptoms. If her symptoms acutely worsen she may need more emergent workup but for now she states she is still able to work and drive as symptoms are not impacting her ability to participate in daily life. 2. CAD - stable  3. Retroperitoneal hematoma 02/2018 - clinically appears stable but will recheck Hgb today. 4. Hyperlipidemia - continue statin.  Disposition: F/u with APP in 2 weeks for recheck, then keep f/u in December with Dr. Johnsie Cancel.  Medication Adjustments/Labs and Tests Ordered: Current medicines are reviewed at length with the patient today.  Concerns regarding medicines are outlined above. Medication changes, Labs  and Tests ordered today are summarized above and listed in the Patient Instructions accessible in Encounters.   Signed, Charlie Pitter, PA-C  03/29/2018 11:21 AM    [address], [place], [place]  [address] Phone: [phone]; Fax: [phone]

## 2018-03-30 ENCOUNTER — Other Ambulatory Visit: Payer: Self-pay | Admitting: Physician Assistant

## 2018-03-30 DIAGNOSIS — R42 Dizziness and giddiness: Secondary | ICD-10-CM

## 2018-03-30 DIAGNOSIS — Z8249 Family history of ischemic heart disease and other diseases of the circulatory system: Secondary | ICD-10-CM

## 2018-03-30 MED FILL — MECLIZINE 25 MG TABLET: 25 | 10 days supply | Qty: 30 | Fill #0

## 2018-03-30 MED FILL — ONDANSETRON ODT 4 MG TABLET: 4 | 5 days supply | Qty: 20 | Fill #1

## 2018-04-04 MED FILL — FENOFIBRATE 160 MG TABLET: 160 | 90 days supply | Qty: 90 | Fill #0

## 2018-04-04 MED FILL — rOPINIRole HCL 1 MG TABS: 1 | 90 days supply | Qty: 90 | Fill #0

## 2018-04-05 ENCOUNTER — Ambulatory Visit (HOSPITAL_COMMUNITY)
Admission: RE | Admit: 2018-04-05 | Discharge: 2018-04-05 | Disposition: A | Discharge status: Home / Self Care | Payer: 59 | Source: Ambulatory Visit | Attending: Cardiology | Admitting: Cardiology

## 2018-04-05 DIAGNOSIS — R42 Dizziness and giddiness: Secondary | ICD-10-CM | POA: Diagnosis not present

## 2018-04-05 DIAGNOSIS — Z8249 Family history of ischemic heart disease and other diseases of the circulatory system: Secondary | ICD-10-CM | POA: Insufficient documentation

## 2018-04-08 ENCOUNTER — Ambulatory Visit (INDEPENDENT_AMBULATORY_CARE_PROVIDER_SITE_OTHER): Payer: Self-pay | Admitting: Family Medicine

## 2018-04-08 VITALS — BP 105/75 | HR 86 | Temp 98.6°F | Resp 16 | Wt 181.0 lb

## 2018-04-08 DIAGNOSIS — J029 Acute pharyngitis, unspecified: Secondary | ICD-10-CM

## 2018-04-08 LAB — POCT RAPID STREP A (OFFICE): Rapid Strep A Screen: NEGATIVE

## 2018-04-08 NOTE — Patient Instructions (Addendum)
Sore Throat  PLAN< Supportive care: warm salt- water gargles, throat lozenges, tylenol for fever and chills, rest, increased hydration, balanced diet. Call if symptoms become worse or are unimproved.   A sore throat is pain, burning, irritation, or scratchiness in the throat. When you have a sore throat, you may feel pain or tenderness in your throat when you swallow or talk. Many things can cause a sore throat, including:  An infection.  Seasonal allergies.  Dryness in the air.  Irritants, such as smoke or pollution.  Gastroesophageal reflux disease (GERD).  A tumor.  A sore throat is often the first sign of another sickness. It may happen with other symptoms, such as coughing, sneezing, fever, and swollen neck glands. Most sore throats go away without medical treatment. Follow these instructions at home:  Take over-the-counter medicines only as told by your health care provider.  Drink enough fluids to keep your urine clear or pale yellow.  Rest as needed.  To help with pain, try: ? Sipping warm liquids, such as broth, herbal tea, or warm water. ? Eating or drinking cold or frozen liquids, such as frozen ice pops. ? Gargling with a salt-water mixture 3-4 times a day or as needed. To make a salt-water mixture, completely dissolve -1 tsp of salt in 1 cup of warm water. ? Sucking on hard candy or throat lozenges. ? Putting a cool-mist humidifier in your bedroom at night to moisten the air. ? Sitting in the bathroom with the door closed for 5-10 minutes while you run hot water in the shower.  Do not use any tobacco products, such as cigarettes, chewing tobacco, and e-cigarettes. If you need help quitting, ask your health care provider. Contact a health care provider if:  You have a fever for more than 2-3 days.  You have symptoms that last (are persistent) for more than 2-3 days.  Your throat does not get better within 7 days.  You have a fever and your symptoms suddenly  get worse. Get help right away if:  You have difficulty breathing.  You cannot swallow fluids, soft foods, or your saliva.  You have increased swelling in your throat or neck.  You have persistent nausea and vomiting. This information is not intended to replace advice given to you by your health care provider. Make sure you discuss any questions you have with your health care provider. Document Released: 07/02/2004 Document Revised: 01/19/2016 Document Reviewed: 03/15/2015 Elsevier Interactive Patient Education  [employer].

## 2018-04-08 NOTE — Progress Notes (Signed)
Brittany Mason is a 67 y.o. female who presents today with concerns of sore throat for the last 2 days. She reports a known sick work contact who was diagnosed with strep but had been treated with antibiotics already prior to returning to work. She has attempted warm salt water rinses with only mild short term relief.  Review of Systems  Constitutional: Negative for chills, fever and malaise/fatigue.  HENT: Positive for sore throat. Negative for congestion, ear discharge, ear pain and sinus pain.   Eyes: Negative.   Respiratory: Negative for cough, sputum production and shortness of breath.   Cardiovascular: Negative.  Negative for chest pain.  Gastrointestinal: Negative for abdominal pain, diarrhea, nausea and vomiting.  Genitourinary: Negative for dysuria, frequency, hematuria and urgency.  Musculoskeletal: Negative for myalgias.  Skin: Negative.   Neurological: Negative for headaches.  Endo/Heme/Allergies: Negative.   Psychiatric/Behavioral: Negative.     O: Vitals:   04/08/18 0906  BP: 105/75  Pulse: 86  Resp: 16  Temp: 98.6 F (37 C)  SpO2: 94%     Physical Exam  Constitutional: She is oriented to person, place, and time. Vital signs are normal. She appears well-developed and well-nourished. She is active.  Non-toxic appearance. She does not have a sickly appearance.  HENT:  Head: Normocephalic.  Right Ear: Hearing, tympanic membrane, external ear and ear canal normal.  Left Ear: Hearing, tympanic membrane, external ear and ear canal normal.  Nose: Nose normal.  Mouth/Throat: Uvula is midline, oropharynx is clear and moist and mucous membranes are normal. Tonsils are 0 on the right. Tonsils are 0 on the left. No tonsillar exudate.  Neck: Normal range of motion. Neck supple.  Cardiovascular: Normal rate, regular rhythm, normal heart sounds and normal pulses.  Pulmonary/Chest: Effort normal and breath sounds normal.  Abdominal: Soft. Bowel sounds are normal.   Musculoskeletal: Normal range of motion.  Lymphadenopathy:       Head (right side): No submental and no submandibular adenopathy present.       Head (left side): No submental and no submandibular adenopathy present.    She has no cervical adenopathy.  Neurological: She is alert and oriented to person, place, and time.  Psychiatric: She has a normal mood and affect.  Vitals reviewed.  A: 1. Acute sore throat    P: Discussed exam findings, diagnosis etiology and medication use and indications reviewed with patient. Follow- Up and discharge instructions provided. No emergent/urgent issues found on exam.  Patient verbalized understanding of information provided and agrees with plan of care (POC), all questions answered.  1. Acute sore throat Supportive care: warm salt- water gargles, throat lozenges, tylenol for fever and chills, rest, increased hydration, balanced diet. Call if symptoms become worse or are unimproved.

## 2018-04-12 ENCOUNTER — Ambulatory Visit: Payer: 59 | Admitting: Cardiology

## 2018-04-13 ENCOUNTER — Ambulatory Visit: Payer: 59 | Admitting: Nurse Practitioner

## 2018-04-14 MED FILL — MECLIZINE 25 MG TABLET: 25 | 7 days supply | Qty: 30 | Fill #0

## 2018-04-27 ENCOUNTER — Other Ambulatory Visit: Payer: Self-pay | Admitting: Cardiovascular Disease

## 2018-04-27 MED FILL — ESCITALOPRAM 20 MG TABLET: 20 | 90 days supply | Qty: 90 | Fill #0

## 2018-04-27 MED FILL — EZETIMIBE 10 MG TABS: 10 | 90 days supply | Qty: 90 | Fill #0

## 2018-04-27 MED FILL — MECLIZINE 25 MG TABLET: 25 | 15 days supply | Qty: 60 | Fill #0

## 2018-05-12 ENCOUNTER — Other Ambulatory Visit (HOSPITAL_COMMUNITY): Payer: Self-pay | Admitting: Otolaryngology

## 2018-05-12 DIAGNOSIS — R42 Dizziness and giddiness: Secondary | ICD-10-CM | POA: Diagnosis not present

## 2018-05-13 ENCOUNTER — Other Ambulatory Visit: Payer: Self-pay | Admitting: Nurse Practitioner

## 2018-05-13 MED FILL — NITROGLYCERIN 0.4 MG TAB SL: 0.4 | 8 days supply | Qty: 25 | Fill #0

## 2018-05-16 MED FILL — MORPHINE SULF ER 30 MG TAB: 30 | 30 days supply | Qty: 60 | Fill #0

## 2018-05-17 ENCOUNTER — Other Ambulatory Visit (HOSPITAL_COMMUNITY): Payer: Self-pay | Admitting: Otolaryngology

## 2018-05-18 ENCOUNTER — Other Ambulatory Visit (HOSPITAL_COMMUNITY): Payer: Self-pay | Admitting: Otolaryngology

## 2018-05-18 DIAGNOSIS — R42 Dizziness and giddiness: Secondary | ICD-10-CM

## 2018-05-24 ENCOUNTER — Encounter (HOSPITAL_COMMUNITY): Payer: Self-pay

## 2018-05-24 ENCOUNTER — Ambulatory Visit (HOSPITAL_COMMUNITY): Admission: RE | Admit: 2018-05-24 | Payer: 59 | Source: Ambulatory Visit

## 2018-05-26 ENCOUNTER — Other Ambulatory Visit (HOSPITAL_COMMUNITY): Payer: Self-pay | Admitting: Otolaryngology

## 2018-05-26 ENCOUNTER — Ambulatory Visit (HOSPITAL_COMMUNITY)
Admission: RE | Admit: 2018-05-26 | Discharge: 2018-05-26 | Disposition: A | Discharge status: Home / Self Care | Payer: 59 | Source: Ambulatory Visit | Attending: Otolaryngology | Admitting: Otolaryngology

## 2018-05-26 DIAGNOSIS — R42 Dizziness and giddiness: Secondary | ICD-10-CM | POA: Insufficient documentation

## 2018-05-26 LAB — POCT I-STAT CREATININE: Creatinine, Ser: 0.8 mg/dL (ref 0.44–1.00)

## 2018-05-26 MED ORDER — GADOBUTROL 1 MMOL/ML IV SOLN
10.0000 mL | Freq: Once | INTRAVENOUS | Status: AC | PRN
  Administered 2018-05-26: 8 mL via INTRAVENOUS

## 2018-05-28 NOTE — Progress Notes (Signed)
Cardiology Office Note    Date:  05/30/2018  ID:  Brittany Mason, DOB 06-12-1950, MRN [id number] PCP:  Hulan Fess, MD  Cardiologist:  Jenkins Rouge, MD   Chief Complaint: dizziness  History of Present Illness:   67 y.o. with history of CAD and Vfib arrest 2017 Stenting of circumflex with residual occluded dista circumflex last cath September 2019 medical Rx recommended by Dr Burt Knack patent stent in circumflex chronically occluded distal circumflex with L-> L collaterals diffusely diseased right PDA not amenable to PCI. Last cath complicated by retroperitoneal hematoma Surgery not required    History of migraines and RLS. Seen by PA 03/29/18 with dizziness. Carotids plaque no stenosis labs ok Metoprolol stopped due to low BP. Meclizine helps.   She works in Engineer, technical sales at [employer].   Still has some angina but not on beta blocker or nitrates meds stopped to see if made vertigo Worse but not correlated  She has new grandson Brittany Mason she will see in [place] for the holidays  Past Medical History:  Diagnosis Date  . Acute pulmonary embolism ([place]) 06/20/2015  . Arthritis    osteoarthritis-knees. Chronic back pain  . Basal cell carcinoma of right shoulder    "burned off"  . CAD in native artery    a. V fib arrest 2017 s/p DES to LCx with occluded dCx. b. repeat cath 02/2018 treated medically (see report) - c/b RPH.  . Chronic combined systolic and diastolic HF (heart failure) ([place]) 02/13/2018  . Chronic SI joint pain   . DVT (deep venous thrombosis) ([place]) 04/2013   left lower leg, resulted in Pulmonary emboli on hormone therapy  . Dyspnea on exertion   . GERD (gastroesophageal reflux disease)   . Hypercholesterolemia   . Hypothyroidism   . Intramuscular hematoma 02/13/2018  . Migraine    "< once/month" (12/04/2015)  . MVP (mitral valve prolapse)    asymptomatic  . Myocardial infarction ([place]) 11/19/2015   cardiac arrest  . PE (pulmonary embolism) Apr 18, 2013   tx. -using Xarelto now, no further  lung problems, denies SOB on 01-02-14  . PONV (postoperative nausea and vomiting)   . Retroperitoneal hematoma   . RLS (restless legs syndrome)     Past Surgical History:  Procedure Laterality Date  . ANTERIOR CRUCIATE LIGAMENT REPAIR Left 1976; 1981   "open"  . CARDIAC CATHETERIZATION N/A 11/20/2015   Procedure: Left Heart Cath and Coronary Angiography;  Surgeon: Belva Crome, MD;  Location: [facility];  Service: Cardiovascular;  Laterality: N/A;  . CARDIAC CATHETERIZATION N/A 12/04/2015   Procedure: Left Heart Cath and Coronary Angiography;  Surgeon: Oluwadarasimi Redmon M Martinique, MD;  Location: [place] [facility];  Service: Cardiovascular;  Laterality: N/A;  . CARDIAC CATHETERIZATION  12/04/2015   Procedure: Coronary Stent Intervention;  Surgeon: Eliodoro Gullett M Martinique, MD;  Location: [place] [facility] [facility];  Service: Cardiovascular;;  . COLONOSCOPY  09/28/2011   Procedure: COLONOSCOPY;  Surgeon: Juanita Craver, MD;  Location: [facility];  Service: Endoscopy;  Laterality: N/A;  . COLONOSCOPY WITH PROPOFOL N/A 01/05/2017   Procedure: COLONOSCOPY WITH PROPOFOL;  Surgeon: Juanita Craver, MD;  Location: [facility];  Service: Endoscopy;  Laterality: N/A;  . INTRAVASCULAR PRESSURE WIRE/FFR STUDY N/A 02/11/2018   Procedure: INTRAVASCULAR PRESSURE WIRE/FFR STUDY;  Surgeon: Sherren Mocha, MD;  Location: [place] [facility];  Service: Cardiovascular;  Laterality: N/A;  . JOINT REPLACEMENT    . LEFT HEART CATH AND CORONARY ANGIOGRAPHY N/A 10/08/2016   Procedure: Left Heart  Cath and Coronary Angiography;  Surgeon: Yeiden Frenkel M Martinique, MD;  Location: [place];  Service: Cardiovascular;  Laterality: N/A;  . LEFT HEART CATH AND CORONARY ANGIOGRAPHY N/A 02/11/2018   Procedure: LEFT HEART CATH AND CORONARY ANGIOGRAPHY;  Surgeon: Sherren Mocha, MD;  Location: [facility] [facility];  Service: Cardiovascular;  Laterality: N/A;  . TOTAL ABDOMINAL HYSTERECTOMY  1998  . TOTAL KNEE ARTHROPLASTY Left 01/08/2014   Procedure: LEFT TOTAL  KNEE ARTHROPLASTY;  Surgeon: Gearlean Alf, MD;  Location: [facility];  Service: Orthopedics;  Laterality: Left;    Current Medications: Current Meds  Medication Sig  . aspirin EC 81 MG tablet Take 81 mg by mouth daily with lunch.  . butalbital-acetaminophen-caffeine (FIORICET, ESGIC) 50-325-40 MG tablet Take 1 tablet by mouth 2 (two) times daily as needed for headache.  . clopidogrel (PLAVIX) 75 MG tablet TAKE 1 TABLET BY MOUTH ONCE DAILY  . cyclobenzaprine (FLEXERIL) 5 MG tablet Take 5 mg by mouth 3 (three) times daily as needed for muscle spasms.  Marland Kitchen escitalopram (LEXAPRO) 20 MG tablet Take 20 mg by mouth every morning.   . ezetimibe (ZETIA) 10 MG tablet Take 1 tablet (10 mg total) by mouth daily.  . fenofibrate 160 MG tablet Take 160 mg by mouth at bedtime.   . isosorbide mononitrate (IMDUR) 30 MG 24 hr tablet Take 0.5 tablets (15 mg total) by mouth daily.  Marland Kitchen levothyroxine (SYNTHROID, LEVOTHROID) 137 MCG tablet Take 137 mcg by mouth daily before breakfast.   . meclizine (ANTIVERT) 25 MG tablet Take 25 mg by mouth 3 (three) times daily as needed for dizziness.  Marland Kitchen morphine (MSIR) 30 MG tablet Take 30 mg by mouth daily.   . nitroGLYCERIN (NITROSTAT) 0.4 MG SL tablet PLACE 1 TABLET UNDER THE TONGUE EVERY 5 MINUTES AS NEEDED FOR CHEST PAIN  . omega-3 acid ethyl esters (LOVAZA) 1 G capsule Take 2 g by mouth 2 (two) times daily.  . ondansetron (ZOFRAN) 4 MG tablet Take 4 mg by mouth every 8 (eight) hours as needed for nausea or vomiting.  Marland Kitchen OVER THE COUNTER MEDICATION Take 2 capsules by mouth daily at 6 PM. "Relizen" herbal supplement for hormone replacement   . Polyethyl Glycol-Propyl Glycol (SYSTANE OP) Place 1-2 drops into both eyes daily as needed (for red/itchy eyes).   Marland Kitchen rOPINIRole (REQUIP) 0.5 MG tablet Take 0.5 mg by mouth at bedtime.   Marland Kitchen scopolamine (TRANSDERM-SCOP) 1 MG/3DAYS Place 1 patch onto the skin as needed (for travel).  . simvastatin (ZOCOR) 40 MG tablet Take 1 tablet (40 mg  total) by mouth daily at 6 PM.  . [DISCONTINUED] isosorbide mononitrate (IMDUR) 30 MG 24 hr tablet Take 0.5 tablets (15 mg total) by mouth daily.      Allergies:   Codeine and Erythromycin   Social History   Socioeconomic History  . Marital status: Single    Spouse name: Not on file  . Number of children: Not on file  . Years of education: Not on file  . Highest education level: Not on file  Occupational History  . Not on file  Social Needs  . Financial resource strain: Not very hard  . Food insecurity:    Worry: Never true    Inability: Never true  . Transportation needs:    Medical: No    Non-medical: No  Tobacco Use  . Smoking status: Never Smoker  . Smokeless tobacco: Never Used  Substance and Sexual Activity  . Alcohol use: No  .  Drug use: No  . Sexual activity: Never    Birth control/protection: Surgical    Comment: HYSTERECTOMY  Lifestyle  . Physical activity:    Days per week: 3 days    Minutes per session: 30 min  . Stress: Only a little  Relationships  . Social connections:    Talks on phone: Three times a week    Gets together: Three times a week    Attends religious service: 1 to 4 times per year    Active member of club or organization: Yes    Attends meetings of clubs or organizations: 1 to 4 times per year    Relationship status: Never married  Other Topics Concern  . Not on file  Social History Narrative   Lives with close friend.     Family History:  [name] family history includes Breast cancer in her maternal aunt and paternal aunt; Cancer in her maternal aunt; Congestive Heart Failure in her mother; Coronary artery disease in her father; Hypertension in her father and mother; Stroke in her brother.  ROS:   Please see the history of present illness.  All other systems are reviewed and otherwise negative.    PHYSICAL EXAM:   VS:  BP 130/84   Pulse 98   Ht 5\' 3"  (1.6 m)   Wt 184 lb (83.5 kg)   BMI 32.59 kg/m   BMI: Body mass  index is 32.59 kg/m. Affect appropriate Healthy:  appears stated age 74: normal Neck supple with no adenopathy JVP normal no bruits no thyromegaly Lungs clear with no wheezing and good diaphragmatic motion Heart:  S1/S2 no murmur, no rub, gallop or click PMI normal Abdomen: benighn, BS positve, no tenderness, no AAA no bruit.  No HSM or HJR Distal pulses intact with no bruits No edema Neuro non-focal Skin warm and dry No muscular weakness   Wt Readings from Last 3 Encounters:  05/30/18 184 lb (83.5 kg)  04/08/18 181 lb (82.1 kg)  03/29/18 181 lb 9.6 oz (82.4 kg)      Studies/Labs Reviewed:   EKG:  EKG was ordered today and personally reviewed by me and demonstrates NSR 66bpm no acute changes, QTc 446bpm  Recent Labs: 03/29/2018: BUN 18; Hemoglobin 14.4; Platelets 266; Potassium 4.8; Sodium 136; TSH 4.490 05/26/2018: Creatinine, Ser 0.80   Lipid Panel    Component Value Date/Time   CHOL 139 02/13/2018 0450   CHOL 138 11/18/2016 0859   TRIG 75 02/13/2018 0450   HDL 54 02/13/2018 0450   HDL 56 11/18/2016 0859   CHOLHDL 2.6 02/13/2018 0450   VLDL 15 02/13/2018 0450   LDLCALC 70 02/13/2018 0450   LDLCALC 66 11/18/2016 0859    Additional studies/ records that were reviewed today include: Summarized above.   ASSESSMENT & PLAN:   1. Hypotension/dizziness - not likely cardiac in etiology Meclizine has helped Carotids 04/05/18 plaqaue No stenosis f/u with primary  2. CAD - stable add back coreg 3.125 bid Imdur 15  3. Retroperitoneal hematoma 02/2018 - resolved Hct 42.4 03/29/18 4. Hyperlipidemia - continue statin.  F/U in 3 months   Jenkins Rouge

## 2018-05-30 ENCOUNTER — Ambulatory Visit: Payer: 59 | Admitting: Cardiovascular Disease

## 2018-05-30 VITALS — BP 130/84 | HR 98 | Ht 63.0 in | Wt 184.0 lb

## 2018-05-30 DIAGNOSIS — I959 Hypotension, unspecified: Secondary | ICD-10-CM | POA: Diagnosis not present

## 2018-05-30 DIAGNOSIS — I2510 Atherosclerotic heart disease of native coronary artery without angina pectoris: Secondary | ICD-10-CM

## 2018-05-30 DIAGNOSIS — E785 Hyperlipidemia, unspecified: Secondary | ICD-10-CM | POA: Diagnosis not present

## 2018-05-30 MED ORDER — CARVEDILOL 3.125 MG PO TABS: 3.1250 mg | ORAL_TABLET | Freq: Two times a day (BID) | ORAL | 3 refills | Status: DC

## 2018-05-30 MED ORDER — ISOSORBIDE MONONITRATE ER 30 MG PO TB24: 15.0000 mg | ORAL_TABLET | Freq: Every day | ORAL | 3 refills | Status: DC

## 2018-05-30 MED FILL — CARVEDILOL 3.125 MG TABLET: 3.125 | 90 days supply | Qty: 180 | Fill #0

## 2018-05-30 MED FILL — ISOSORBIDE MN ER 30 MG TAB: 30 | 90 days supply | Qty: 45 | Fill #0

## 2018-05-30 NOTE — Patient Instructions (Signed)
Medication Instructions:  Your physician has recommended you make the following change in your medication:  1-START Carvedilol (Coreg) 3.125 mg by mouth twice daily. 2-START Imdur 15 mg by mouth daily.  If you need a refill on your cardiac medications before your next appointment, please call your pharmacy.   Lab work:  If you have labs (blood work) drawn today and your tests are completely normal, you will receive your results only by: Marland Kitchen MyChart Message (if you have MyChart) OR . A paper copy in the mail If you have any lab test that is abnormal or we need to change your treatment, we will call you to review the results.  Testing/Procedures: NONE ordered today.  Follow-Up: At [facility], you and your health needs are our priority.  As part of our continuing mission to provide you with exceptional heart care, we have created designated Provider Care Teams.  These Care Teams include your primary Cardiologist (physician) and Advanced Practice Providers (APPs -  Physician Assistants and Nurse Practitioners) who all work together to provide you with the care you need, when you need it. You will need a follow up appointment in 3 months. You may see Jenkins Rouge, MD or one of the following Advanced Practice Providers on your designated Care Team:   Truitt Merle, NP Cecilie Kicks, NP . Kathyrn Drown, NP

## 2018-06-09 DIAGNOSIS — H8120 Vestibular neuronitis, unspecified ear: Secondary | ICD-10-CM | POA: Diagnosis not present

## 2018-06-09 MED FILL — MECLIZINE 25 MG TABLET: 25 | 22 days supply | Qty: 90 | Fill #0

## 2018-06-09 MED FILL — CLOPIDOGREL 75 MG TABLET: 75 | 90 days supply | Qty: 90 | Fill #1

## 2018-06-21 ENCOUNTER — Telehealth: Payer: Self-pay | Admitting: Cardiovascular Disease

## 2018-06-21 MED ORDER — METOPROLOL TARTRATE 25 MG PO TABS: 12.5000 mg | ORAL_TABLET | Freq: Two times a day (BID) | ORAL | 3 refills | Status: DC

## 2018-06-21 MED FILL — METOPROLOL TARTRATE 25 MG T: 25 | 90 days supply | Qty: 90 | Fill #0

## 2018-06-21 NOTE — Telephone Encounter (Signed)
Called patient back about Dr. Kyla Balzarine recommendation. Patient verbalized understanding. Removed coreg from patient's medication list and sent in for Lopressor 12.5 mg BID to patient's pharmacy of choice.

## 2018-06-21 NOTE — Telephone Encounter (Signed)
Pt c/o medication issue:  1. Name of Medication: carvedilol (COREG) 3.125 MG tablet  2. How are you currently taking this medication (dosage and times per day)?  Take 1 tablet (3.125 mg total) by mouth 2 (two) times daily.  3. Are you having a reaction (difficulty breathing--STAT)? NO  4. What is your medication issue? Patient started taking this medication and it made her dizzy, she stop taking it last Thursday, and symptoms got better. She wants to know if something else can be prescribed.

## 2018-06-21 NOTE — Telephone Encounter (Signed)
Called patient back about her message. Patient complaining of severe dizziness while taking Coreg, patient stated more like vertigo, not lightheadedness. Patient stated she has not had any more dizziness since stopping Coreg on Thursday. Patient does not have any BP and HR readings, but stated her BP and HR are good. Patient asked if she needed to go back on Metoprolol tartrate 12.5 mg BID. Will forward to Dr. Johnsie Cancel for advisement.

## 2018-06-21 NOTE — Telephone Encounter (Signed)
Ok to try lopressor 12.5 bid

## 2018-06-23 DIAGNOSIS — D2261 Melanocytic nevi of right upper limb, including shoulder: Secondary | ICD-10-CM | POA: Diagnosis not present

## 2018-06-23 DIAGNOSIS — L738 Other specified follicular disorders: Secondary | ICD-10-CM | POA: Diagnosis not present

## 2018-06-23 DIAGNOSIS — Z85828 Personal history of other malignant neoplasm of skin: Secondary | ICD-10-CM | POA: Diagnosis not present

## 2018-06-23 DIAGNOSIS — D225 Melanocytic nevi of trunk: Secondary | ICD-10-CM | POA: Diagnosis not present

## 2018-06-23 DIAGNOSIS — L814 Other melanin hyperpigmentation: Secondary | ICD-10-CM | POA: Diagnosis not present

## 2018-06-23 DIAGNOSIS — D2262 Melanocytic nevi of left upper limb, including shoulder: Secondary | ICD-10-CM | POA: Diagnosis not present

## 2018-06-23 DIAGNOSIS — L821 Other seborrheic keratosis: Secondary | ICD-10-CM | POA: Diagnosis not present

## 2018-06-23 DIAGNOSIS — D1801 Hemangioma of skin and subcutaneous tissue: Secondary | ICD-10-CM | POA: Diagnosis not present

## 2018-06-23 DIAGNOSIS — L2089 Other atopic dermatitis: Secondary | ICD-10-CM | POA: Diagnosis not present

## 2018-06-23 MED FILL — BETAMETHASONE DP 0.05% OINT: 0.05 | 20 days supply | Qty: 45 | Fill #0

## 2018-06-29 MED FILL — SIMVASTATIN 40 MG TABLET: 40 | 90 days supply | Qty: 90 | Fill #1

## 2018-06-29 MED FILL — rOPINIRole HCL 1 MG TABS: 1 | 90 days supply | Qty: 90 | Fill #1

## 2018-06-29 MED FILL — FENOFIBRATE 160 MG TABLET: 160 | 90 days supply | Qty: 90 | Fill #0

## 2018-07-01 DIAGNOSIS — G8929 Other chronic pain: Secondary | ICD-10-CM | POA: Diagnosis not present

## 2018-07-01 DIAGNOSIS — G894 Chronic pain syndrome: Secondary | ICD-10-CM | POA: Diagnosis not present

## 2018-07-01 DIAGNOSIS — Z23 Encounter for immunization: Secondary | ICD-10-CM | POA: Diagnosis not present

## 2018-07-04 MED FILL — SYNTHROID 137 MCG TABLET: 137 | 90 days supply | Qty: 90 | Fill #0

## 2018-07-04 MED FILL — AMOXICILLIN 500 MG CAPSULE: 500 | 3 days supply | Qty: 12 | Fill #1

## 2018-07-07 MED FILL — MORPHINE SULF ER 30 MG TAB: 30 | 30 days supply | Qty: 60 | Fill #0

## 2018-07-15 ENCOUNTER — Ambulatory Visit (INDEPENDENT_AMBULATORY_CARE_PROVIDER_SITE_OTHER): Payer: Self-pay | Admitting: Family Medicine

## 2018-07-15 ENCOUNTER — Encounter: Payer: Self-pay | Admitting: Family Medicine

## 2018-07-15 VITALS — BP 105/75 | HR 61 | Temp 98.3°F | Wt 191.6 lb

## 2018-07-15 DIAGNOSIS — R300 Dysuria: Secondary | ICD-10-CM

## 2018-07-15 DIAGNOSIS — N949 Unspecified condition associated with female genital organs and menstrual cycle: Secondary | ICD-10-CM

## 2018-07-15 LAB — POCT URINALYSIS DIPSTICK
Bilirubin, UA: NEGATIVE
GLUCOSE UA: NEGATIVE
KETONES UA: NEGATIVE
Leukocytes, UA: NEGATIVE
Nitrite, UA: NEGATIVE
Protein, UA: NEGATIVE
RBC UA: NEGATIVE
SPEC GRAV UA: 1.01 (ref 1.010–1.025)
Urobilinogen, UA: 0.2 E.U./dL
pH, UA: 6.5 (ref 5.0–8.0)

## 2018-07-15 NOTE — Progress Notes (Signed)
Brittany Mason is a 68 y.o. female who presents today with 2 days of vaginal discomfort. She has used vagasil cream to the area with some mild relief she described as numbing with the primary symptoms of burning on urination and vaginal pain. She denies any relevant recent history related to this, denies fever, chills, back pain or risk for STI/STD, discharge, odor, or recent trauma. She is under the care of cardiology for heart related conditions. She does have a primary care provider.   Review of Systems  Constitutional: Negative for chills, fever and malaise/fatigue.  HENT: Negative for congestion, ear discharge, ear pain, sinus pain and sore throat.   Eyes: Negative.   Respiratory: Negative for cough, sputum production and shortness of breath.   Cardiovascular: Negative.  Negative for chest pain.  Gastrointestinal: Negative for abdominal pain, diarrhea, nausea and vomiting.  Genitourinary: Positive for dysuria and urgency. Negative for frequency and hematuria.       Vaginal discomfort  Musculoskeletal: Negative for myalgias.  Skin: Negative.   Neurological: Negative for headaches.  Endo/Heme/Allergies: Negative.   Psychiatric/Behavioral: Negative.     Brittany Mason has a current medication list which includes the following prescription(s): aspirin ec, butalbital-acetaminophen-caffeine, clopidogrel, cyclobenzaprine, escitalopram, ezetimibe, fenofibrate, isosorbide mononitrate, levothyroxine, meclizine, metoprolol tartrate, morphine, nitroglycerin, omega-3 acid ethyl esters, ropinirole, scopolamine, simvastatin, ondansetron, OVER THE COUNTER MEDICATION, and polyethyl glycol-propyl glycol. Also is allergic to codeine and erythromycin.  Brittany Mason  has a past medical history of Acute pulmonary embolism ([place]) (06/20/2015), Arthritis, Basal cell carcinoma of right shoulder, CAD in native artery, Chronic combined systolic and diastolic HF (heart failure) ([place]) (02/13/2018), Chronic SI joint pain, DVT (deep venous  thrombosis) ([place]) (04/2013), Dyspnea on exertion, GERD (gastroesophageal reflux disease), Hypercholesterolemia, Hypothyroidism, Intramuscular hematoma (02/13/2018), Migraine, MVP (mitral valve prolapse), Myocardial infarction ([place]) (11/19/2015), PE (pulmonary embolism) (Apr 18, 2013), PONV (postoperative nausea and vomiting), Retroperitoneal hematoma, and RLS (restless legs syndrome). Also  has a past surgical history that includes Anterior cruciate ligament repair (Left, 1976; 1981); Colonoscopy (09/28/2011); Total knee arthroplasty (Left, 01/08/2014); Joint replacement; Cardiac catheterization (N/A, 11/20/2015); Cardiac catheterization (N/A, 12/04/2015); Cardiac catheterization (12/04/2015); Total abdominal hysterectomy (1998); LEFT HEART CATH AND CORONARY ANGIOGRAPHY (N/A, 10/08/2016); Colonoscopy with propofol (N/A, 01/05/2017); LEFT HEART CATH AND CORONARY ANGIOGRAPHY (N/A, 02/11/2018); and INTRAVASCULAR PRESSURE WIRE/FFR STUDY (N/A, 02/11/2018).    O: Vitals:   07/15/18 0844  BP: 105/75  Pulse: 61  Temp: 98.3 F (36.8 C)  SpO2: 96%     Physical Exam Vitals signs reviewed.  Constitutional:      Appearance: She is well-developed. She is not toxic-appearing.  HENT:     Head: Normocephalic.     Right Ear: Hearing, tympanic membrane, ear canal and external ear normal.     Left Ear: Hearing, tympanic membrane, ear canal and external ear normal.     Nose: Nose normal.     Mouth/Throat:     Pharynx: Uvula midline.  Neck:     Musculoskeletal: Normal range of motion and neck supple.  Cardiovascular:     Rate and Rhythm: Normal rate and regular rhythm.     Pulses: Normal pulses.     Heart sounds: Normal heart sounds.  Pulmonary:     Effort: Pulmonary effort is normal.     Breath sounds: Normal breath sounds.  Abdominal:     General: Bowel sounds are normal.     Palpations: Abdomen is soft.     Tenderness: There is no abdominal tenderness. There is no right CVA tenderness, left  CVA tenderness or  guarding. Negative signs include Murphy's sign.     Comments: Concern of flank pain with tapping to right side of flank but no evidence of suprapubic pain on palpation or evidence of acute abdomen.  Musculoskeletal: Normal range of motion.  Lymphadenopathy:     Head:     Right side of head: No submental or submandibular adenopathy.     Left side of head: No submental or submandibular adenopathy.     Cervical: No cervical adenopathy.  Neurological:     Mental Status: She is alert and oriented to person, place, and time.    A: 1. Dysuria   2. Vaginal discomfort    P: Concern that patient may need comprehensive evaluation due to normal lab testing and overall non specific symptoms of vaginal concerns specifically pain not associated with urination that she has been using the vagasil cream for. Discussed alternative evaluation options with patient due to evidence of flank pain, normal UA and other vaginal concerns.   1. Dysuria - POCT urinalysis dipstick Results for orders placed or performed in visit on 07/15/18 (from the past 24 hour(s))  POCT urinalysis dipstick     Status: Normal   Collection Time: 07/15/18  9:00 AM  Result Value Ref Range   Color, UA LT YELLOW    Clarity, UA CLEAR    Glucose, UA Negative Negative   Bilirubin, UA NEGATIVE    Ketones, UA NEGATIVE    Spec Grav, UA 1.010 1.010 - 1.025   Blood, UA NEGATIVE    pH, UA 6.5 5.0 - 8.0   Protein, UA Negative Negative   Urobilinogen, UA 0.2 0.2 or 1.0 E.U./dL   Nitrite, UA NEGATIVE    Leukocytes, UA Negative Negative   Appearance     Odor       2. Vaginal discomfort Advised the best option is evaluation for symptoms and cream and topicals can worsen symptoms or mask them or interfere with testing. Patient verbalized understanding.  Discussed with patient exam findings, suspected diagnosis etiology and  reviewed recommended treatment plan and follow up, including complications and indications for urgent medical  follow up and evaluation. Medications including use and indications reviewed with patient. Patient provided relevant patient education on diagnosis and/or relevant related condition that were discussed and reviewed with patient at discharge. Patient verbalized understanding of information provided and agrees with plan of care (POC), all questions answered.

## 2018-07-15 NOTE — Patient Instructions (Signed)
PLAN< Seek evaluation of symptoms with PCP or location equipped to do comprehensive diagnostic of symptoms  Dysuria Dysuria is pain or discomfort while urinating. The pain or discomfort may be felt in the part of your body that drains urine from the bladder (urethra) or in the surrounding tissue of the genitals. The pain may also be felt in the groin area, lower abdomen, or lower back. You may have to urinate frequently or have the sudden feeling that you have to urinate (urgency). Dysuria can affect both men and women, but it is more common in women. Dysuria can be caused by many different things, including:  Urinary tract infection.  Kidney stones or bladder stones.  Certain sexually transmitted infections (STIs), such as chlamydia.  Dehydration.  Inflammation of the tissues of the vagina.  Use of certain medicines.  Use of certain soaps or scented products that cause irritation. Follow these instructions at home: General instructions  Watch your condition for any changes.  Urinate often. Avoid holding urine for long periods of time.  After a bowel movement or urination, women should cleanse from front to back, using each tissue only once.  Urinate after sexual intercourse.  Keep all follow-up visits as told by your health care provider. This is important.  If you had any tests done to find the cause of dysuria, it is up to you to get your test results. Ask your health care provider, or the department that is doing the test, when your results will be ready. Eating and drinking   Drink enough fluid to keep your urine pale yellow.  Avoid caffeine, tea, and alcohol. They can irritate the bladder and make dysuria worse. In men, alcohol may irritate the prostate. Medicines  Take over-the-counter and prescription medicines only as told by your health care provider.  If you were prescribed an antibiotic medicine, take it as told by your health care provider. Do not stop taking  the antibiotic even if you start to feel better. Contact a health care provider if:  You have a fever.  You develop pain in your back or sides.  You have nausea or vomiting.  You have blood in your urine.  You are not urinating as often as you usually do. Get help right away if:  Your pain is severe and not relieved with medicines.  You cannot eat or drink without vomiting.  You are confused.  You have a rapid heartbeat while at rest.  You have shaking or chills.  You feel extremely weak. Summary  Dysuria is pain or discomfort while urinating. Many different conditions can lead to dysuria.  If you have dysuria, you may have to urinate frequently or have the sudden feeling that you have to urinate (urgency).  Watch your condition for any changes. Keep all follow-up visits as told by your health care provider.  Make sure that you urinate often and drink enough fluid to keep your urine pale yellow. This information is not intended to replace advice given to you by your health care provider. Make sure you discuss any questions you have with your health care provider. Document Released: 02/21/2004 Document Revised: 03/11/2017 Document Reviewed: 03/11/2017 Elsevier Interactive Patient Education  2019 [employer].

## 2018-07-27 DIAGNOSIS — F338 Other recurrent depressive disorders: Secondary | ICD-10-CM | POA: Diagnosis not present

## 2018-07-27 DIAGNOSIS — Z13820 Encounter for screening for osteoporosis: Secondary | ICD-10-CM | POA: Diagnosis not present

## 2018-07-27 DIAGNOSIS — Z8669 Personal history of other diseases of the nervous system and sense organs: Secondary | ICD-10-CM | POA: Diagnosis not present

## 2018-07-27 DIAGNOSIS — E782 Mixed hyperlipidemia: Secondary | ICD-10-CM | POA: Diagnosis not present

## 2018-07-27 DIAGNOSIS — I2510 Atherosclerotic heart disease of native coronary artery without angina pectoris: Secondary | ICD-10-CM | POA: Diagnosis not present

## 2018-07-27 DIAGNOSIS — Z0000 Encounter for general adult medical examination without abnormal findings: Secondary | ICD-10-CM | POA: Diagnosis not present

## 2018-07-27 DIAGNOSIS — E039 Hypothyroidism, unspecified: Secondary | ICD-10-CM | POA: Diagnosis not present

## 2018-07-27 DIAGNOSIS — G2581 Restless legs syndrome: Secondary | ICD-10-CM | POA: Diagnosis not present

## 2018-07-27 DIAGNOSIS — G8929 Other chronic pain: Secondary | ICD-10-CM | POA: Diagnosis not present

## 2018-08-01 MED FILL — ESCITALOPRAM 20 MG TABLET: 20 | 90 days supply | Qty: 90 | Fill #1

## 2018-08-01 MED FILL — SYNTHROID 150 MCG TABLET: 150 | 30 days supply | Qty: 30 | Fill #0

## 2018-08-01 MED FILL — OMEGA-3 ETHYL ESTERS 1 GM C: 1 | 90 days supply | Qty: 360 | Fill #0

## 2018-08-11 MED FILL — EZETIMIBE 10 MG TABS: 10 | 90 days supply | Qty: 90 | Fill #1

## 2018-08-29 ENCOUNTER — Telehealth: Payer: Self-pay

## 2018-08-29 NOTE — Telephone Encounter (Signed)
Patient returned your call.

## 2018-08-29 NOTE — Telephone Encounter (Signed)
   Primary Cardiologist:  Jenkins Rouge, MD   Patient contacted.  History reviewed by Dr. Johnsie Cancel.  No symptoms to suggest any unstable cardiac conditions.  Based on discussion, with current pandemic situation, we will be postponing this appointment for 08/29/18.  If symptoms change, she has been instructed to contact our office. [place] has been rescheduled for 11/09/18, per Dr. Johnsie Cancel to f/u in 3 months.  Michaelyn Barter, RN  08/29/2018 3:53 PM         ./

## 2018-08-29 NOTE — Telephone Encounter (Signed)
Left message for patient to call back  

## 2018-09-03 MED FILL — ISOSORBIDE MN ER 30 MG TAB: 30 | 90 days supply | Qty: 45 | Fill #1

## 2018-09-03 MED FILL — CLOPIDOGREL 75 MG TABLET: 75 | 90 days supply | Qty: 90 | Fill #2

## 2018-09-03 MED FILL — SIMVASTATIN 40 MG TABLET: 40 | 90 days supply | Qty: 90 | Fill #2

## 2018-09-03 MED FILL — MECLIZINE 25 MG TABLET: 25 | 22 days supply | Qty: 90 | Fill #1

## 2018-09-05 ENCOUNTER — Ambulatory Visit: Payer: 59 | Admitting: Cardiovascular Disease

## 2018-09-05 MED FILL — FENOFIBRATE 160 MG TABLET: 160 | 90 days supply | Qty: 90 | Fill #0

## 2018-09-05 MED FILL — rOPINIRole HCL 1 MG TABS: 1 | 90 days supply | Qty: 90 | Fill #0

## 2018-09-05 MED FILL — SYNTHROID 150 MCG TABLET: 150 | 90 days supply | Qty: 90 | Fill #0

## 2018-09-05 MED FILL — ESCITALOPRAM 20 MG TABLET: 20 | 90 days supply | Qty: 90 | Fill #0

## 2018-09-08 MED FILL — MORPHINE SULF ER 30 MG TAB: 30 | 30 days supply | Qty: 60 | Fill #0

## 2018-09-19 MED FILL — MECLIZINE 25 MG TABLET: 25 | 22 days supply | Qty: 90 | Fill #0

## 2018-10-06 MED FILL — MECLIZINE 25 MG TABLET: 25 | 23 days supply | Qty: 90 | Fill #0

## 2018-10-22 MED FILL — EZETIMIBE 10 MG TABS: 10 | 90 days supply | Qty: 90 | Fill #2

## 2018-10-26 DIAGNOSIS — G894 Chronic pain syndrome: Secondary | ICD-10-CM | POA: Diagnosis not present

## 2018-10-27 ENCOUNTER — Telehealth: Payer: Self-pay

## 2018-10-27 NOTE — Telephone Encounter (Signed)
Virtual Visit Pre-Appointment Phone Call  1. "What is the BEST phone number to call the day of the visit?" - include this in appointment notes  2. "Do you have or have access to (through a family member/friend) a smartphone with video capability that we can use for your visit?" a. If yes - list this number in appt notes as "cell" (if different from BEST phone #) and list the appointment type as a VIDEO visit in appointment notes b. If no - list the appointment type as a PHONE visit in appointment notes  3. Confirm consent - "In the setting of the current Covid19 crisis, you are scheduled for a (phone or video) visit with your provider on (date) at (time).  Just as we do with many in-office visits, in order for you to participate in this visit, we must obtain consent.  If you'd like, I can send this to your mychart (if signed up) or email for you to review.  Otherwise, I can obtain your verbal consent now.  All virtual visits are billed to your insurance company just like a normal visit would be.  By agreeing to a virtual visit, we'd like you to understand that the technology does not allow for your provider to perform an examination, and thus may limit your provider's ability to fully assess your condition. If your provider identifies any concerns that need to be evaluated in person, we will make arrangements to do so.  Finally, though the technology is pretty good, we cannot assure that it will always work on either your or our end, and in the setting of a video visit, we may have to convert it to a phone-only visit.  In either situation, we cannot ensure that we have a secure connection.  Are you willing to proceed?Yes  4. Advise patient to be prepared - "Two hours prior to your appointment, go ahead and check your blood pressure, pulse, oxygen saturation, and your weight (if you have the equipment to check those) and write them all down. When your visit starts, your provider will ask you for this  information. If you have an Apple Watch or Kardia device, please plan to have heart rate information ready on the day of your appointment. Please have a pen and paper handy nearby the day of the visit as well."  5. Give patient instructions for MyChart download to smartphone OR Doximity/[email or web address].me as below if video visit (depending on what platform provider is using)  6. Inform patient they will receive a phone call 15 minutes prior to their appointment time (may be from unknown caller ID) so they should be prepared to answer    TELEPHONE CALL NOTE  PAKOU RAINBOW has been deemed a candidate for a follow-up tele-health visit to limit community exposure during the Covid-19 pandemic. I spoke with the patient via phone to ensure availability of phone/video source, confirm preferred email & phone number, and discuss instructions and expectations.  I reminded PEGAH SEGEL to be prepared with any vital sign and/or heart rhythm information that could potentially be obtained via home monitoring, at the time of her visit. I reminded KINETA FUDALA to expect a phone call prior to her visit.  Michaelyn Barter, RN 10/27/2018   IF USING DOXIMITY or DOXY.ME - The patient will receive a link just prior to their visit by text.     FULL LENGTH CONSENT FOR TELE-HEALTH VISIT   I hereby voluntarily request, consent and authorize [facility] and its employed or contracted physicians, Engineer, materials, nurse practitioners or other licensed health care professionals (the Practitioner), to provide me with telemedicine health care services (the "Services") as deemed necessary by the treating Practitioner. I acknowledge and consent to receive the Services by the Practitioner via telemedicine. I understand that the telemedicine visit will involve communicating with the Practitioner through live audiovisual communication technology and the disclosure of certain medical information by electronic transmission. I  acknowledge that I have been given the opportunity to request an in-person assessment or other available alternative prior to the telemedicine visit and am voluntarily participating in the telemedicine visit.  I understand that I have the right to withhold or withdraw my consent to the use of telemedicine in the course of my care at any time, without affecting my right to future care or treatment, and that the Practitioner or I may terminate the telemedicine visit at any time. I understand that I have the right to inspect all information obtained and/or recorded in the course of the telemedicine visit and may receive copies of available information for a reasonable fee.  I understand that some of the potential risks of receiving the Services via telemedicine include:  Marland Kitchen Delay or interruption in medical evaluation due to technological equipment failure or disruption; . Information transmitted may not be sufficient (e.g. poor resolution of images) to allow for appropriate medical decision making by the Practitioner; and/or  . In rare instances, security protocols could fail, causing a breach of personal health information.  Furthermore, I acknowledge that it is my responsibility to provide information about my medical history, conditions and care that is complete and accurate to the best of my ability. I acknowledge that Practitioner's advice, recommendations, and/or decision may be based on factors not within their control, such as incomplete or inaccurate data provided by me or distortions of diagnostic images or specimens that may result from electronic transmissions. I understand that the practice of medicine is not an exact science and that Practitioner makes no warranties or guarantees regarding treatment outcomes. I acknowledge that I will receive a copy of this consent concurrently upon execution via email to the email address I last provided but may also request a printed copy by calling the office of  [place].    I understand that my insurance will be billed for this visit.   I have read or had this consent read to me. . I understand the contents of this consent, which adequately explains the benefits and risks of the Services being provided via telemedicine.  . I have been provided ample opportunity to ask questions regarding this consent and the Services and have had my questions answered to my satisfaction. . I give my informed consent for the services to be provided through the use of telemedicine in my medical care  By participating in this telemedicine visit I agree to the above.

## 2018-11-04 NOTE — Progress Notes (Signed)
Virtual Visit via Video Note   This visit type was conducted due to national recommendations for restrictions regarding the COVID-19 Pandemic (e.g. social distancing) in an effort to limit this patient's exposure and mitigate transmission in our community.  Due to her co-morbid illnesses, this patient is at least at moderate risk for complications without adequate follow up.  This format is felt to be most appropriate for this patient at this time.  All issues noted in this document were discussed and addressed.  A limited physical exam was performed with this format.  Please refer to the patient's chart for her consent to telehealth for [facility].   Date:  11/08/2018   ID:  Brittany Mason, DOB Feb 08, 1951, MRN [id number]  Patient Location: Home Provider Location: Office  PCP:  Hulan Fess, MD  Cardiologist:   Johnsie Cancel Electrophysiologist:  None   Evaluation Performed:  Follow-Up Visit  Chief Complaint:  CAD  History of Present Illness:    68 y.o. with history of CAD and Vfib arrest 2017 Stenting of circumflex with residual occluded dista circumflex last cath September 2019 medical Rx recommended by Dr Burt Knack patent stent in circumflex chronically occluded distal circumflex with L-> L collaterals diffusely diseased right PDA not amenable to PCI. Last cath complicated by retroperitoneal hematoma Surgery not required    History of migraines and RLS. Seen by PA 03/29/18 with dizziness. Carotids plaque no stenosis labs ok Metoprolol stopped due to low BP. Meclizine helps.   She works in Engineer, technical sales at [employer].   Still has some angina but not on beta blocker or nitrates meds stopped to see if made vertigo Worse but not correlated Started back on coreg and imdur low dose 05/30/18   She has grandson [name]  in [place]   The patient  does not have symptoms concerning for COVID-19 infection (fever, chills, cough, or new shortness of breath).    Past Medical History:  Diagnosis Date  . Acute  pulmonary embolism ([place]) 06/20/2015  . Arthritis    osteoarthritis-knees. Chronic back pain  . Basal cell carcinoma of right shoulder    "burned off"  . CAD in native artery    a. V fib arrest 2017 s/p DES to LCx with occluded dCx. b. repeat cath 02/2018 treated medically (see report) - c/b RPH.  . Chronic combined systolic and diastolic HF (heart failure) ([place]) 02/13/2018  . Chronic SI joint pain   . DVT (deep venous thrombosis) ([place]) 04/2013   left lower leg, resulted in Pulmonary emboli on hormone therapy  . Dyspnea on exertion   . GERD (gastroesophageal reflux disease)   . Hypercholesterolemia   . Hypothyroidism   . Intramuscular hematoma 02/13/2018  . Migraine    "< once/month" (12/04/2015)  . MVP (mitral valve prolapse)    asymptomatic  . Myocardial infarction ([place]) 11/19/2015   cardiac arrest  . PE (pulmonary embolism) Apr 18, 2013   tx. -using Xarelto now, no further lung problems, denies SOB on 01-02-14  . PONV (postoperative nausea and vomiting)   . Retroperitoneal hematoma   . RLS (restless legs syndrome)    Past Surgical History:  Procedure Laterality Date  . ANTERIOR CRUCIATE LIGAMENT REPAIR Left 1976; 1981   "open"  . CARDIAC CATHETERIZATION N/A 11/20/2015   Procedure: Left Heart Cath and Coronary Angiography;  Surgeon: Belva Crome, MD;  Location: [place] [facility];  Service: Cardiovascular;  Laterality: N/A;  . CARDIAC CATHETERIZATION N/A 12/04/2015   Procedure: Left Heart Cath and Coronary  Angiography;  Surgeon: Kani Jobson M Martinique, MD;  Location: [place] [facility];  Service: Cardiovascular;  Laterality: N/A;  . CARDIAC CATHETERIZATION  12/04/2015   Procedure: Coronary Stent Intervention;  Surgeon: Ryla Cauthon M Martinique, MD;  Location: [place] [facility];  Service: Cardiovascular;;  . COLONOSCOPY  09/28/2011   Procedure: COLONOSCOPY;  Surgeon: Juanita Craver, MD;  Location: [facility];  Service: Endoscopy;  Laterality: N/A;  . COLONOSCOPY WITH PROPOFOL N/A 01/05/2017   Procedure:  COLONOSCOPY WITH PROPOFOL;  Surgeon: Juanita Craver, MD;  Location: [facility];  Service: Endoscopy;  Laterality: N/A;  . INTRAVASCULAR PRESSURE WIRE/FFR STUDY N/A 02/11/2018   Procedure: INTRAVASCULAR PRESSURE WIRE/FFR STUDY;  Surgeon: Sherren Mocha, MD;  Location: [place] [facility];  Service: Cardiovascular;  Laterality: N/A;  . JOINT REPLACEMENT    . LEFT HEART CATH AND CORONARY ANGIOGRAPHY N/A 10/08/2016   Procedure: Left Heart Cath and Coronary Angiography;  Surgeon: Omolara Carol M Martinique, MD;  Location: [place] [facility];  Service: Cardiovascular;  Laterality: N/A;  . LEFT HEART CATH AND CORONARY ANGIOGRAPHY N/A 02/11/2018   Procedure: LEFT HEART CATH AND CORONARY ANGIOGRAPHY;  Surgeon: Sherren Mocha, MD;  Location: [place] [facility];  Service: Cardiovascular;  Laterality: N/A;  . TOTAL ABDOMINAL HYSTERECTOMY  1998  . TOTAL KNEE ARTHROPLASTY Left 01/08/2014   Procedure: LEFT TOTAL KNEE ARTHROPLASTY;  Surgeon: Gearlean Alf, MD;  Location: [facility];  Service: Orthopedics;  Laterality: Left;     Current Meds  Medication Sig  . aspirin EC 81 MG tablet Take 81 mg by mouth daily with lunch.  . butalbital-acetaminophen-caffeine (FIORICET, ESGIC) 50-325-40 MG tablet Take 1 tablet by mouth 2 (two) times daily as needed for headache.  . clopidogrel (PLAVIX) 75 MG tablet TAKE 1 TABLET BY MOUTH ONCE DAILY  . cyclobenzaprine (FLEXERIL) 5 MG tablet Take 5 mg by mouth 3 (three) times daily as needed for muscle spasms.  Marland Kitchen escitalopram (LEXAPRO) 20 MG tablet Take 20 mg by mouth every morning.   . ezetimibe (ZETIA) 10 MG tablet Take 1 tablet (10 mg total) by mouth daily.  . fenofibrate 160 MG tablet Take 160 mg by mouth at bedtime.   . isosorbide mononitrate (IMDUR) 30 MG 24 hr tablet Take 0.5 tablets (15 mg total) by mouth daily.  Marland Kitchen levothyroxine (SYNTHROID, LEVOTHROID) 137 MCG tablet Take 137 mcg by mouth daily before breakfast.   . meclizine (ANTIVERT) 25 MG tablet Take 25 mg by mouth 3 (three) times daily  as needed for dizziness.  . metoprolol tartrate (LOPRESSOR) 25 MG tablet Take 0.5 tablets (12.5 mg total) by mouth 2 (two) times daily.  Marland Kitchen morphine (MSIR) 30 MG tablet Take 30 mg by mouth daily.   . nitroGLYCERIN (NITROSTAT) 0.4 MG SL tablet PLACE 1 TABLET UNDER THE TONGUE EVERY 5 MINUTES AS NEEDED FOR CHEST PAIN  . omega-3 acid ethyl esters (LOVAZA) 1 G capsule Take 2 g by mouth 2 (two) times daily.  . ondansetron (ZOFRAN) 4 MG tablet Take 4 mg by mouth every 8 (eight) hours as needed for nausea or vomiting.  Marland Kitchen OVER THE COUNTER MEDICATION Take 2 capsules by mouth daily at 6 PM. "Relizen" herbal supplement for hormone replacement   . Polyethyl Glycol-Propyl Glycol (SYSTANE OP) Place 1-2 drops into both eyes daily as needed (for red/itchy eyes).   Marland Kitchen rOPINIRole (REQUIP) 0.5 MG tablet Take 0.5 mg by mouth at bedtime.   Marland Kitchen scopolamine (TRANSDERM-SCOP) 1 MG/3DAYS Place 1 patch onto the skin as needed (for travel).  Marland Kitchen  simvastatin (ZOCOR) 40 MG tablet Take 1 tablet (40 mg total) by mouth daily at 6 PM.     Allergies:   Codeine and Erythromycin   Social History   Tobacco Use  . Smoking status: Never Smoker  . Smokeless tobacco: Never Used  Substance Use Topics  . Alcohol use: No  . Drug use: No     Family Hx: [name] family history includes Breast cancer in her maternal aunt and paternal aunt; Cancer in her maternal aunt; Congestive Heart Failure in her mother; Coronary artery disease in her father; Hypertension in her father and mother; Stroke in her brother.  ROS:   Please see the history of present illness.     All other systems reviewed and are negative.   Prior CV studies:   The following studies were reviewed today:  Carotid 04/05/18  No obstructive dx Cath 02/11/18  Conclusion   1. Moderate 2 vessel CAD involving the LCx and RCA and chronic occlusion of the distal LCx collateralized from L--->L collaterals 2. Patent left main and LAD with mild nonobstructive disease 3.  Low LVEDP 4. Negative FFR of the left circumflex  Recommend: continued medical therapy. There is progressive stenosis of the right PDA but this vessel is diffusely diseased and not favorable for PCI.  Procedure complicated by hypotension after hemostasis is obtained. 0.5 mg atropine and IV fluids are administered. Groin site is tender into the right lower quadrant. Plan stat non-contrast CT.     Labs/Other Tests and Data Reviewed:    EKG:   03/29/18 SR rate 66 low voltage   Recent Labs: 03/29/2018: BUN 18; Hemoglobin 14.4; Platelets 266; Potassium 4.8; Sodium 136; TSH 4.490 05/26/2018: Creatinine, Ser 0.80   Recent Lipid Panel Lab Results  Component Value Date/Time   CHOL 139 02/13/2018 04:50 AM   CHOL 138 11/18/2016 08:59 AM   TRIG 75 02/13/2018 04:50 AM   HDL 54 02/13/2018 04:50 AM   HDL 56 11/18/2016 08:59 AM   CHOLHDL 2.6 02/13/2018 04:50 AM   LDLCALC 70 02/13/2018 04:50 AM   LDLCALC 66 11/18/2016 08:59 AM    Wt Readings from Last 3 Encounters:  11/08/18 83.4 kg  07/15/18 86.9 kg  02/12/18 84.9 kg     Objective:    Vital Signs:  BP 124/82   Pulse 67   Ht 5\' 3"  (1.6 m)   Wt 83.4 kg   SpO2 97%   BMI 32.56 kg/m    Skin warm and dry No distress No tachypnea No JVP elevation  Neuro appears non focal No edema  Telephone visit no exam   ASSESSMENT & PLAN:    1. Hypotension/dizziness - not likely cardiac in etiology Meclizine has helped Carotids 04/05/18 plaqaue No stenosis f/u with primary  2. CAD - stable add back coreg 3.125 bid Imdur 15  3. Retroperitoneal hematoma 02/2018 - resolved Hct 42.4 03/29/18 4. Hyperlipidemia - continue statin.  COVID-19 Education: The signs and symptoms of COVID-19 were discussed with the patient and how to seek care for testing (follow up with PCP or arrange E-visit).  The importance of social distancing was discussed today.  Time:   Today, I have spent 30 minutes with the patient with telehealth technology discussing  the above problems.     Medication Adjustments/Labs and Tests Ordered: Current medicines are reviewed at length with the patient today.  Concerns regarding medicines are outlined above.   Tests Ordered:  None   Medication Changes:  None   Disposition:  Follow up in a year  Signed, Jenkins Rouge, MD  11/08/2018 3:31 PM    [place]

## 2018-11-08 ENCOUNTER — Telehealth (INDEPENDENT_AMBULATORY_CARE_PROVIDER_SITE_OTHER): Payer: 59 | Admitting: Cardiovascular Disease

## 2018-11-08 ENCOUNTER — Other Ambulatory Visit: Payer: Self-pay

## 2018-11-08 ENCOUNTER — Encounter: Payer: Self-pay | Admitting: Cardiovascular Disease

## 2018-11-08 VITALS — BP 124/82 | HR 67 | Ht 63.0 in | Wt 183.8 lb

## 2018-11-08 DIAGNOSIS — I2510 Atherosclerotic heart disease of native coronary artery without angina pectoris: Secondary | ICD-10-CM | POA: Diagnosis not present

## 2018-11-08 MED ORDER — ISOSORBIDE MONONITRATE ER 30 MG PO TB24: 30.0000 mg | ORAL_TABLET | Freq: Every day | ORAL | 3 refills | Status: DC

## 2018-11-08 NOTE — Patient Instructions (Addendum)
Medication Instructions:  Your physician has recommended you make the following change in your medication:  1- Increase Imdur 30 mg by mouth daily.  If you need a refill on your cardiac medications before your next appointment, please call your pharmacy.   Lab work:  If you have labs (blood work) drawn today and your tests are completely normal, you will receive your results only by: Marland Kitchen MyChart Message (if you have MyChart) OR . A paper copy in the mail If you have any lab test that is abnormal or we need to change your treatment, we will call you to review the results.  Testing/Procedures: None ordered today.  Follow-Up: At [facility], you and your health needs are our priority.  As part of our continuing mission to provide you with exceptional heart care, we have created designated Provider Care Teams.  These Care Teams include your primary Cardiologist (physician) and Advanced Practice Providers (APPs -  Physician Assistants and Nurse Practitioners) who all work together to provide you with the care you need, when you need it. You will need a follow up appointment in 6 months.  Please call our office 2 months in advance to schedule this appointment.  You may see Jenkins Rouge, MD or one of the following Advanced Practice Providers on your designated Care Team:   Truitt Merle, NP Cecilie Kicks, NP . Kathyrn Drown, NP

## 2018-11-09 ENCOUNTER — Telehealth: Payer: 59 | Admitting: Cardiovascular Disease

## 2018-11-09 MED FILL — MORPHINE SULF ER 30 MG TAB: 30 | 30 days supply | Qty: 60 | Fill #0

## 2018-11-23 MED FILL — METOPROLOL TARTRATE 25 MG T: 25 | 90 days supply | Qty: 90 | Fill #1

## 2018-11-23 MED FILL — OMEGA-3 ETHYL ESTERS 1 GM C: 1 | 90 days supply | Qty: 360 | Fill #0

## 2018-11-26 MED FILL — ISOSORBIDE MN ER 30 MG TAB: 30 | 90 days supply | Qty: 45 | Fill #2

## 2018-11-28 MED FILL — FENOFIBRATE 160 MG TABLET: 160 | 90 days supply | Qty: 90 | Fill #1

## 2018-11-28 MED FILL — ESCITALOPRAM 20 MG TABLET: 20 | 90 days supply | Qty: 90 | Fill #1

## 2018-11-28 MED FILL — rOPINIRole HCL 1 MG TABS: 1 | 90 days supply | Qty: 90 | Fill #0

## 2018-11-29 DIAGNOSIS — Z1231 Encounter for screening mammogram for malignant neoplasm of breast: Secondary | ICD-10-CM | POA: Diagnosis not present

## 2018-11-29 DIAGNOSIS — E2839 Other primary ovarian failure: Secondary | ICD-10-CM | POA: Diagnosis not present

## 2018-11-29 DIAGNOSIS — Z780 Asymptomatic menopausal state: Secondary | ICD-10-CM | POA: Diagnosis not present

## 2018-11-29 DIAGNOSIS — Z96652 Presence of left artificial knee joint: Secondary | ICD-10-CM | POA: Diagnosis not present

## 2018-12-02 MED FILL — CLOPIDOGREL 75 MG TABLET: 75 | 90 days supply | Qty: 90 | Fill #3

## 2018-12-02 MED FILL — SIMVASTATIN 40 MG TABLET: 40 | 90 days supply | Qty: 90 | Fill #3

## 2018-12-08 MED FILL — SYNTHROID 150 MCG TABLET: 150 | 30 days supply | Qty: 30 | Fill #1

## 2018-12-29 MED FILL — MORPHINE SULF ER 30 MG TAB: 30 | 30 days supply | Qty: 60 | Fill #0

## 2019-01-09 MED FILL — SYNTHROID 150 MCG TABLET: 150 | 90 days supply | Qty: 90 | Fill #0

## 2019-01-20 MED FILL — EZETIMIBE 10 MG TABS: 10 | 90 days supply | Qty: 90 | Fill #3

## 2019-02-15 MED FILL — OMEGA-3 ETHYL ESTERS 1 GM C: 1 | 90 days supply | Qty: 360 | Fill #1

## 2019-02-22 MED FILL — AMOXICILLIN 500 MG CAPSULE: 500 | 3 days supply | Qty: 12 | Fill #0

## 2019-02-22 MED FILL — MORPHINE SULF ER 30 MG TAB: 30 | 30 days supply | Qty: 60 | Fill #0

## 2019-02-22 MED FILL — METOPROLOL TARTRATE 25 MG T: 25 | 90 days supply | Qty: 90 | Fill #2

## 2019-02-24 MED FILL — ISOSORBIDE MN ER 30 MG TAB: 30 | 90 days supply | Qty: 45 | Fill #3

## 2019-02-27 MED FILL — ESCITALOPRAM 20 MG TABLET: 20 | 90 days supply | Qty: 90 | Fill #0

## 2019-02-27 MED FILL — FENOFIBRATE 160 MG TABLET: 160 | 90 days supply | Qty: 90 | Fill #0

## 2019-03-03 ENCOUNTER — Other Ambulatory Visit: Payer: Self-pay | Admitting: Cardiovascular Disease

## 2019-03-03 MED FILL — CLOPIDOGREL 75 MG TABLET: 75 | 90 days supply | Qty: 90 | Fill #0

## 2019-03-03 MED FILL — rOPINIRole HCL 1 MG TABS: 1 | 90 days supply | Qty: 90 | Fill #1

## 2019-03-06 ENCOUNTER — Other Ambulatory Visit: Payer: Self-pay | Admitting: Cardiovascular Disease

## 2019-03-08 MED FILL — SIMVASTATIN 40 MG TABLET: 40 | 90 days supply | Qty: 90 | Fill #0

## 2019-03-12 DIAGNOSIS — Z20828 Contact with and (suspected) exposure to other viral communicable diseases: Secondary | ICD-10-CM | POA: Diagnosis not present

## 2019-03-12 DIAGNOSIS — J029 Acute pharyngitis, unspecified: Secondary | ICD-10-CM | POA: Diagnosis not present

## 2019-03-24 MED FILL — NITROGLYCERIN 0.4 MG TAB SL: 0.4 | 8 days supply | Qty: 25 | Fill #1

## 2019-03-27 MED FILL — MORPHINE SULF ER 30 MG TAB: 30 | 30 days supply | Qty: 60 | Fill #0

## 2019-04-10 MED FILL — SYNTHROID 150 MCG TABLET: 150 | 90 days supply | Qty: 90 | Fill #1

## 2019-04-18 ENCOUNTER — Other Ambulatory Visit: Payer: Self-pay | Admitting: Cardiovascular Disease

## 2019-04-18 MED FILL — AMOXICILLIN 500 MG CAPSULE: 500 | 3 days supply | Qty: 12 | Fill #1

## 2019-04-20 MED FILL — EZETIMIBE 10 MG TABS: 10 | 90 days supply | Qty: 90 | Fill #0

## 2019-05-09 DIAGNOSIS — H5203 Hypermetropia, bilateral: Secondary | ICD-10-CM | POA: Diagnosis not present

## 2019-05-09 DIAGNOSIS — H52223 Regular astigmatism, bilateral: Secondary | ICD-10-CM | POA: Diagnosis not present

## 2019-05-09 DIAGNOSIS — H524 Presbyopia: Secondary | ICD-10-CM | POA: Diagnosis not present

## 2019-05-09 DIAGNOSIS — H33302 Unspecified retinal break, left eye: Secondary | ICD-10-CM | POA: Diagnosis not present

## 2019-05-09 DIAGNOSIS — H59812 Chorioretinal scars after surgery for detachment, left eye: Secondary | ICD-10-CM | POA: Diagnosis not present

## 2019-05-09 DIAGNOSIS — H2513 Age-related nuclear cataract, bilateral: Secondary | ICD-10-CM | POA: Diagnosis not present

## 2019-05-09 DIAGNOSIS — H40033 Anatomical narrow angle, bilateral: Secondary | ICD-10-CM | POA: Diagnosis not present

## 2019-05-16 NOTE — Progress Notes (Signed)
Date:  05/17/2019   ID:  MYANA SZUBA, DOB January 28, 1951, MRN [id number]  Provider Location: Office  PCP:  Hulan Fess, MD  Cardiologist:   Johnsie Cancel Electrophysiologist:  None   Evaluation Performed:  Follow-Up Visit  Chief Complaint:  CAD  History of Present Illness:    68 y.o. with history of CAD and Vfib arrest 2017 Stenting of circumflex with residual occluded dista circumflex last cath September 2019 medical Rx recommended by Dr Burt Knack patent stent in circumflex chronically occluded distal circumflex with L-> L collaterals diffusely diseased right PDA not amenable to PCI. Last cath complicated by retroperitoneal hematoma Surgery not required    History of migraines and RLS. Seen by PA 03/29/18 with dizziness. Carotids plaque no stenosis labs ok Metoprolol stopped due to low BP. Meclizine helps.   She works in Engineer, technical sales at [employer].   Still has some angina but not on beta blocker or nitrates meds stopped to see if made vertigo Worse but not correlated Started back on coreg and imdur low dose 05/30/18  She has not had Chest pain for 2 months and at that time was resting pain relieved with one nitro   She has a great nephew in [place] teaches at [employer]    The patient  does not have symptoms concerning for COVID-19 infection (fever, chills, cough, or new shortness of breath).    Past Medical History:  Diagnosis Date  . Acute pulmonary embolism ([place]) 06/20/2015  . Arthritis    osteoarthritis-knees. Chronic back pain  . Basal cell carcinoma of right shoulder    "burned off"  . CAD in native artery    a. V fib arrest 2017 s/p DES to LCx with occluded dCx. b. repeat cath 02/2018 treated medically (see report) - c/b RPH.  . Chronic combined systolic and diastolic HF (heart failure) ([place]) 02/13/2018  . Chronic SI joint pain   . DVT (deep venous thrombosis) ([place]) 04/2013   left lower leg, resulted in Pulmonary emboli on hormone therapy  . Dyspnea on exertion   . GERD  (gastroesophageal reflux disease)   . Hypercholesterolemia   . Hypothyroidism   . Intramuscular hematoma 02/13/2018  . Migraine    "< once/month" (12/04/2015)  . MVP (mitral valve prolapse)    asymptomatic  . Myocardial infarction ([place]) 11/19/2015   cardiac arrest  . PE (pulmonary embolism) Apr 18, 2013   tx. -using Xarelto now, no further lung problems, denies SOB on 01-02-14  . PONV (postoperative nausea and vomiting)   . Retroperitoneal hematoma   . RLS (restless legs syndrome)    Past Surgical History:  Procedure Laterality Date  . ANTERIOR CRUCIATE LIGAMENT REPAIR Left 1976; 1981   "open"  . CARDIAC CATHETERIZATION N/A 11/20/2015   Procedure: Left Heart Cath and Coronary Angiography;  Surgeon: Belva Crome, MD;  Location: [place] [facility];  Service: Cardiovascular;  Laterality: N/A;  . CARDIAC CATHETERIZATION N/A 12/04/2015   Procedure: Left Heart Cath and Coronary Angiography;  Surgeon: Altovise Wahler M Martinique, MD;  Location: [place] [facility];  Service: Cardiovascular;  Laterality: N/A;  . CARDIAC CATHETERIZATION  12/04/2015   Procedure: Coronary Stent Intervention;  Surgeon: Hannahgrace Lalli M Martinique, MD;  Location: [place] [facility];  Service: Cardiovascular;;  . COLONOSCOPY  09/28/2011   Procedure: COLONOSCOPY;  Surgeon: Juanita Craver, MD;  [facility];  Service: Endoscopy;  Laterality: N/A;  . COLONOSCOPY WITH PROPOFOL N/A 01/05/2017   Procedure: COLONOSCOPY WITH PROPOFOL;  Surgeon: Juanita Craver, MD;  [facility];  Service: Endoscopy;  Laterality: N/A;  . INTRAVASCULAR PRESSURE WIRE/FFR STUDY N/A 02/11/2018   Procedure: INTRAVASCULAR PRESSURE WIRE/FFR STUDY;  Surgeon: Sherren Mocha, MD;  Location: [place] [facility];  Service: Cardiovascular;  Laterality: N/A;  . JOINT REPLACEMENT    . LEFT HEART CATH AND CORONARY ANGIOGRAPHY N/A 10/08/2016   Procedure: Left Heart Cath and Coronary Angiography;  Surgeon: Alexias Margerum M Martinique, MD;  Location: [place] [facility];  Service:  Cardiovascular;  Laterality: N/A;  . LEFT HEART CATH AND CORONARY ANGIOGRAPHY N/A 02/11/2018   Procedure: LEFT HEART CATH AND CORONARY ANGIOGRAPHY;  Surgeon: Sherren Mocha, MD;  Location: [place] [facility];  Service: Cardiovascular;  Laterality: N/A;  . TOTAL ABDOMINAL HYSTERECTOMY  1998  . TOTAL KNEE ARTHROPLASTY Left 01/08/2014   Procedure: LEFT TOTAL KNEE ARTHROPLASTY;  Surgeon: Gearlean Alf, MD;  [facility];  Service: Orthopedics;  Laterality: Left;     Current Meds  Medication Sig  . aspirin EC 81 MG tablet Take 81 mg by mouth daily with lunch.  . butalbital-acetaminophen-caffeine (FIORICET, ESGIC) 50-325-40 MG tablet Take 1 tablet by mouth 2 (two) times daily as needed for headache.  . clopidogrel (PLAVIX) 75 MG tablet TAKE 1 TABLET BY MOUTH ONCE DAILY  . cyclobenzaprine (FLEXERIL) 5 MG tablet Take 5 mg by mouth 3 (three) times daily as needed for muscle spasms.  Marland Kitchen escitalopram (LEXAPRO) 20 MG tablet Take 20 mg by mouth every morning.   . ezetimibe (ZETIA) 10 MG tablet TAKE 1 TABLET BY MOUTH ONCE DAILY  . fenofibrate 160 MG tablet Take 160 mg by mouth at bedtime.   . isosorbide mononitrate (IMDUR) 30 MG 24 hr tablet Take 1 tablet (30 mg total) by mouth daily.  Marland Kitchen levothyroxine (SYNTHROID, LEVOTHROID) 137 MCG tablet Take 137 mcg by mouth daily before breakfast.   . meclizine (ANTIVERT) 25 MG tablet Take 25 mg by mouth 3 (three) times daily as needed for dizziness.  . metoprolol tartrate (LOPRESSOR) 25 MG tablet Take 0.5 tablets (12.5 mg total) by mouth 2 (two) times daily.  Marland Kitchen morphine (MSIR) 30 MG tablet Take 30 mg by mouth daily.   . nitroGLYCERIN (NITROSTAT) 0.4 MG SL tablet PLACE 1 TABLET UNDER THE TONGUE EVERY 5 MINUTES AS NEEDED FOR CHEST PAIN  . omega-3 acid ethyl esters (LOVAZA) 1 G capsule Take 2 g by mouth 2 (two) times daily.  . ondansetron (ZOFRAN) 4 MG tablet Take 4 mg by mouth every 8 (eight) hours as needed for nausea or vomiting.  Marland Kitchen OVER THE COUNTER MEDICATION Take  2 capsules by mouth daily at 6 PM. "Relizen" herbal supplement for hormone replacement   . Polyethyl Glycol-Propyl Glycol (SYSTANE OP) Place 1-2 drops into both eyes daily as needed (for red/itchy eyes).   Marland Kitchen rOPINIRole (REQUIP) 0.5 MG tablet Take 0.5 mg by mouth at bedtime.   Marland Kitchen scopolamine (TRANSDERM-SCOP) 1 MG/3DAYS Place 1 patch onto the skin as needed (for travel).  . simvastatin (ZOCOR) 40 MG tablet TAKE 1 TABLET BY MOUTH ONCE DAILY AT 6 PM     Allergies:   Codeine and Erythromycin   Social History   Tobacco Use  . Smoking status: Never Smoker  . Smokeless tobacco: Never Used  Substance Use Topics  . Alcohol use: No  . Drug use: No     Family Hx: [name] family history includes Breast cancer in her maternal aunt and paternal aunt; Cancer in her maternal aunt; Congestive Heart Failure in her mother;  Coronary artery disease in her father; Hypertension in her father and mother; Stroke in her brother.  ROS:   Please see the history of present illness.     All other systems reviewed and are negative.   Prior CV studies:   The following studies were reviewed today:  Carotid 04/05/18  No obstructive dx Cath 02/11/18  Conclusion   1. Moderate 2 vessel CAD involving the LCx and RCA and chronic occlusion of the distal LCx collateralized from L--->L collaterals 2. Patent left main and LAD with mild nonobstructive disease 3. Low LVEDP 4. Negative FFR of the left circumflex  Recommend: continued medical therapy. There is progressive stenosis of the right PDA but this vessel is diffusely diseased and not favorable for PCI.  Procedure complicated by hypotension after hemostasis is obtained. 0.5 mg atropine and IV fluids are administered. Groin site is tender into the right lower quadrant. Plan stat non-contrast CT.     Labs/Other Tests and Data Reviewed:    EKG:   03/29/18 SR rate 66 low voltage 05/17/19 SR PVC/PAC otherwise normal   Recent Labs: 05/26/2018: Creatinine,  Ser 0.80   Recent Lipid Panel Lab Results  Component Value Date/Time   CHOL 139 02/13/2018 04:50 AM   CHOL 138 11/18/2016 08:59 AM   TRIG 75 02/13/2018 04:50 AM   HDL 54 02/13/2018 04:50 AM   HDL 56 11/18/2016 08:59 AM   CHOLHDL 2.6 02/13/2018 04:50 AM   LDLCALC 70 02/13/2018 04:50 AM   LDLCALC 66 11/18/2016 08:59 AM    Wt Readings from Last 3 Encounters:  05/17/19 179 lb 12.8 oz (81.6 kg)  11/08/18 183 lb 12.8 oz (83.4 kg)  07/15/18 191 lb 9.6 oz (86.9 kg)     Objective:    Vital Signs:  BP 100/62   Pulse 68   Ht 5\' 3"  (1.6 m)   Wt 179 lb 12.8 oz (81.6 kg)   SpO2 97%   BMI 31.85 kg/m    Affect appropriate Healthy:  appears stated age HEENT: normal Neck supple with no adenopathy JVP normal no bruits no thyromegaly Lungs clear with no wheezing and good diaphragmatic motion Heart:  S1/S2 no murmur, no rub, gallop or click PMI normal Abdomen: benighn, BS positve, no tenderness, no AAA no bruit.  No HSM or HJR Distal pulses intact with no bruits No edema Neuro non-focal Skin warm and dry No muscular weakness   ASSESSMENT & PLAN:    1. Hypotension/dizziness -  resolved ? Related to vertigo improved  2. CAD - stable stenting mid circumflex with occluded distal circumflex collateralized and diffuse PDA disease Back on coreg and nitrates  3. Retroperitoneal hematoma 02/2018 - resolved Hct 40.6 07/2018  She has a much better left radial pulse and would   Consider using this next cath  4. Hyperlipidemia - continue statin.labs with primary   COVID-19 Education: The signs and symptoms of COVID-19 were discussed with the patient and how to seek care for testing (follow up with PCP or arrange E-visit).  The importance of social distancing was discussed today.   Medication Adjustments/Labs and Tests Ordered: Current medicines are reviewed at length with the patient today.  Concerns regarding medicines are outlined above.   Tests Ordered:  None   Medication Changes:   None   Disposition:  Follow up in a year  Signed, Jenkins Rouge, MD  05/17/2019 11:36 AM    [place]

## 2019-05-17 ENCOUNTER — Ambulatory Visit: Payer: 59 | Admitting: Cardiovascular Disease

## 2019-05-17 ENCOUNTER — Other Ambulatory Visit: Payer: Self-pay

## 2019-05-17 ENCOUNTER — Encounter: Payer: Self-pay | Admitting: Cardiovascular Disease

## 2019-05-17 VITALS — BP 100/62 | HR 68 | Ht 63.0 in | Wt 179.8 lb

## 2019-05-17 DIAGNOSIS — I2510 Atherosclerotic heart disease of native coronary artery without angina pectoris: Secondary | ICD-10-CM

## 2019-05-17 NOTE — Patient Instructions (Signed)
Medication Instructions:   *If you need a refill on your cardiac medications before your next appointment, please call your pharmacy*  Lab Work:  If you have labs (blood work) drawn today and your tests are completely normal, you will receive your results only by: . MyChart Message (if you have MyChart) OR . A paper copy in the mail If you have any lab test that is abnormal or we need to change your treatment, we will call you to review the results.  Follow-Up: At [facility], you and your health needs are our priority.  As part of our continuing mission to provide you with exceptional heart care, we have created designated Provider Care Teams.  These Care Teams include your primary Cardiologist (physician) and Advanced Practice Providers (APPs -  Physician Assistants and Nurse Practitioners) who all work together to provide you with the care you need, when you need it.  Your next appointment:   6 months  The format for your next appointment:   In Person  Provider:   You may see Brittany Nishan, MD or one of the following Advanced Practice Providers on your designated Care Team:    Brittany Gerhardt, NP  Brittany Ingold, NP  Brittany McDaniel, NP    

## 2019-05-18 MED FILL — MORPHINE SULF ER 30 MG TAB: 30 | 30 days supply | Qty: 60 | Fill #0

## 2019-05-24 DIAGNOSIS — G894 Chronic pain syndrome: Secondary | ICD-10-CM | POA: Diagnosis not present

## 2019-05-26 MED FILL — ISOSORBIDE MN ER 30 MG TAB: 30 | 90 days supply | Qty: 90 | Fill #0

## 2019-05-28 MED FILL — METOPROLOL TARTRATE 25 MG T: 25 | 90 days supply | Qty: 90 | Fill #3

## 2019-05-28 MED FILL — rOPINIRole HCL 1 MG TABS: 1 | 90 days supply | Qty: 90 | Fill #2

## 2019-05-28 MED FILL — FENOFIBRATE 160 MG TABLET: 160 | 90 days supply | Qty: 90 | Fill #1

## 2019-05-28 MED FILL — CLOPIDOGREL 75 MG TABLET: 75 | 90 days supply | Qty: 90 | Fill #1

## 2019-05-29 MED FILL — SIMVASTATIN 40 MG TABLET: 40 | 90 days supply | Qty: 90 | Fill #1

## 2019-05-29 MED FILL — OMEGA-3 ETHYL ESTERS 1 GM C: 1 | 90 days supply | Qty: 360 | Fill #2

## 2019-06-29 MED FILL — SCOPOLAMINE 1 MG/3DAYS PT72: 1 | 30 days supply | Qty: 9 | Fill #0

## 2019-06-29 MED FILL — CYCLOBENZAPRINE HCL 5 MG TA: 5 | 90 days supply | Qty: 270 | Fill #0

## 2019-06-30 MED FILL — SYNTHROID 150 MCG TABLET: 150 | 90 days supply | Qty: 90 | Fill #0

## 2019-06-30 MED FILL — BUTALBITAL-APAP-CAFFEINE 50: 50-325-40 | 3 days supply | Qty: 20 | Fill #0

## 2019-07-05 MED FILL — MORPHINE SULF ER 30 MG TAB: 30 | 30 days supply | Qty: 60 | Fill #0

## 2019-07-09 ENCOUNTER — Ambulatory Visit: Payer: 59

## 2019-07-16 ENCOUNTER — Ambulatory Visit: Payer: Medicare Other | Attending: Internal Medicine

## 2019-07-16 DIAGNOSIS — Z23 Encounter for immunization: Secondary | ICD-10-CM | POA: Insufficient documentation

## 2019-07-16 NOTE — Progress Notes (Signed)
   Covid-19 Vaccination Clinic  Name:  Brittany Mason    MRN: [id number] DOB: 1950/12/27  07/16/2019  [name] was observed post Covid-19 immunization for 15 minutes without incidence. She was provided with Vaccine Information Sheet and instruction to access the V-Safe system.   [name] was instructed to call 911 with any severe reactions post vaccine: Marland Kitchen Difficulty breathing  . Swelling of your face and throat  . A fast heartbeat  . A bad rash all over your body  . Dizziness and weakness    Immunizations Administered    Name Date Dose VIS Date Route   Pfizer COVID-19 Vaccine 07/16/2019 11:05 AM 0.3 mL 05/19/2019 Intramuscular   Manufacturer: [place]   Lot: [id number]   [place]: [id number]

## 2019-07-20 ENCOUNTER — Ambulatory Visit: Payer: 59

## 2019-08-09 ENCOUNTER — Ambulatory Visit: Payer: Medicare Other | Attending: Internal Medicine

## 2019-08-09 ENCOUNTER — Ambulatory Visit: Payer: Medicare Other

## 2019-08-09 DIAGNOSIS — Z23 Encounter for immunization: Secondary | ICD-10-CM | POA: Insufficient documentation

## 2019-08-09 NOTE — Progress Notes (Signed)
   Covid-19 Vaccination Clinic  Name:  Brittany Mason    MRN: [id number] DOB: 1951-03-28  08/09/2019  [name] was observed post Covid-19 immunization for 15 minutes without incident. She was provided with Vaccine Information Sheet and instruction to access the V-Safe system.   [name] was instructed to call 911 with any severe reactions post vaccine: Marland Kitchen Difficulty breathing  . Swelling of face and throat  . A fast heartbeat  . A bad rash all over body  . Dizziness and weakness   Immunizations Administered    Name Date Dose VIS Date Route   Pfizer COVID-19 Vaccine 08/09/2019 11:09 AM 0.3 mL 05/19/2019 Intramuscular   Manufacturer: [place]   Lot: [id number]   [place]: [id number]

## 2019-08-18 ENCOUNTER — Other Ambulatory Visit: Payer: Self-pay

## 2019-08-18 MED ORDER — EZETIMIBE 10 MG PO TABS: 10.0000 mg | ORAL_TABLET | Freq: Every day | ORAL | 2 refills | Status: DC

## 2019-08-18 MED ORDER — METOPROLOL TARTRATE 25 MG PO TABS: 12.5000 mg | ORAL_TABLET | Freq: Two times a day (BID) | ORAL | 2 refills | Status: DC

## 2019-08-18 MED ORDER — CLOPIDOGREL BISULFATE 75 MG PO TABS: 75.0000 mg | ORAL_TABLET | Freq: Every day | ORAL | 2 refills | Status: DC

## 2019-08-18 MED ORDER — ISOSORBIDE MONONITRATE ER 30 MG PO TB24: 30.0000 mg | ORAL_TABLET | Freq: Every day | ORAL | 2 refills | Status: DC

## 2019-09-06 ENCOUNTER — Other Ambulatory Visit: Payer: Self-pay

## 2019-09-06 MED ORDER — SIMVASTATIN 40 MG PO TABS: ORAL_TABLET | ORAL | 2 refills | Status: DC

## 2019-11-17 NOTE — Progress Notes (Signed)
Virtual Visit via Video Note   This visit type was conducted due to national recommendations for restrictions regarding the COVID-19 Pandemic (e.g. social distancing) in an effort to limit this patient's exposure and mitigate transmission in our community.  Due to her co-morbid illnesses, this patient is at least at moderate risk for complications without adequate follow up.  This format is felt to be most appropriate for this patient at this time.  All issues noted in this document were discussed and addressed.  A limited physical exam was performed with this format.  Please refer to the patient's chart for her consent to telehealth for [facility].   Patient Location: Home Physician Location : Office   Date:  11/20/2019   ID:  FRONIA DEPASS, DOB 1950-12-19, MRN [id number]   PCP:  Hulan Fess, MD  Cardiologist:   Johnsie Cancel Electrophysiologist:  None   Evaluation Performed:  Follow-Up Visit  Chief Complaint:  CAD  History of Present Illness:    69 y.o. with history of CAD and Vfib arrest 2017 Stenting of circumflex with residual occluded dista circumflex Cath September 2019 medical Rx recommended by Dr Burt Knack patent stent in circumflex chronically occluded distal circumflex with L-> L collaterals diffusely diseased right PDA not amenable to PCI. Cath complicated by retroperitoneal hematoma Surgery not required     History of migraines and RLS. Seen by PA 03/29/18 with dizziness. Carotids plaque no stenosis labs ok Metoprolol stopped due to low BP. Meclizine helps.   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    Past Medical History:  Diagnosis Date  . Acute pulmonary embolism ([place]) 06/20/2015  . Arthritis    osteoarthritis-knees. Chronic back pain  . Basal cell carcinoma of right shoulder    "burned off"  . CAD in native artery    a. V fib arrest 2017 s/p DES to LCx with occluded dCx. b. repeat cath 02/2018 treated medically (see report) - c/b RPH.  . Chronic combined systolic and diastolic HF (heart failure) ([place]) 02/13/2018  . Chronic SI joint pain   . DVT (deep venous thrombosis) ([place]) 04/2013   left lower leg, resulted in Pulmonary emboli on hormone therapy  . Dyspnea on exertion   . GERD (gastroesophageal reflux disease)   . Hypercholesterolemia   . Hypothyroidism   . Intramuscular hematoma 02/13/2018  . Migraine    "< once/month" (12/04/2015)  . MVP (mitral valve prolapse)    asymptomatic  . Myocardial infarction ([place]) 11/19/2015   cardiac arrest  . PE (pulmonary embolism) Apr 18, 2013   tx. -using Xarelto now, no further lung problems, denies SOB on 01-02-14  . PONV (postoperative nausea and vomiting)   . Retroperitoneal hematoma   . RLS (restless legs syndrome)    Past Surgical History:  Procedure Laterality Date  . ANTERIOR CRUCIATE LIGAMENT REPAIR Left 1976; 1981   "open"  . CARDIAC CATHETERIZATION N/A 11/20/2015   Procedure: Left Heart Cath and Coronary Angiography;  Surgeon: Lynnell Dike  Tamala Julian, MD;  Location: [place] [facility];  Service: Cardiovascular;  Laterality: N/A;  . CARDIAC CATHETERIZATION N/A 12/04/2015   Procedure: Left Heart Cath and Coronary Angiography;  Surgeon: Dwight Adamczak M Martinique, MD;  Location: [place] [facility];  Service: Cardiovascular;  Laterality: N/A;  . CARDIAC CATHETERIZATION  12/04/2015   Procedure: Coronary Stent Intervention;  Surgeon: Carolyn Sylvia M Martinique, MD;  Location: [place] [facility];  Service: Cardiovascular;;  . COLONOSCOPY  09/28/2011   Procedure: COLONOSCOPY;  Surgeon:  Juanita Craver, MD;  [facility];  Service: Endoscopy;  Laterality: N/A;  . COLONOSCOPY WITH PROPOFOL N/A 01/05/2017   Procedure: COLONOSCOPY WITH PROPOFOL;  Surgeon: Juanita Craver, MD;  [facility];  Service: Endoscopy;  Laterality: N/A;  . INTRAVASCULAR PRESSURE WIRE/FFR STUDY N/A 02/11/2018   Procedure: INTRAVASCULAR PRESSURE WIRE/FFR STUDY;  Surgeon: Sherren Mocha, MD;  Location: [place] [facility];  Service: Cardiovascular;  Laterality: N/A;  . JOINT REPLACEMENT    . LEFT HEART CATH AND CORONARY ANGIOGRAPHY N/A 10/08/2016   Procedure: Left Heart Cath and Coronary Angiography;  Surgeon: Delrae Hagey M Martinique, MD;  Location: [place] [facility];  Service: Cardiovascular;  Laterality: N/A;  . LEFT HEART CATH AND CORONARY ANGIOGRAPHY N/A 02/11/2018   Procedure: LEFT HEART CATH AND CORONARY ANGIOGRAPHY;  Surgeon: Sherren Mocha, MD;  Location: [place] [facility];  Service: Cardiovascular;  Laterality: N/A;  . TOTAL ABDOMINAL HYSTERECTOMY  1998  . TOTAL KNEE ARTHROPLASTY Left 01/08/2014   Procedure: LEFT TOTAL KNEE ARTHROPLASTY;  Surgeon: Gearlean Alf, MD;  [facility];  Service: Orthopedics;  Laterality: Left;     Current Meds  Medication Sig  . aspirin EC 81 MG tablet Take 81 mg by mouth daily with lunch.  . butalbital-acetaminophen-caffeine (FIORICET, ESGIC) 50-325-40 MG tablet Take 1 tablet by mouth 2 (two) times daily as needed for headache.  . clopidogrel (PLAVIX) 75 MG tablet Take 1 tablet (75 mg total) by mouth daily.  . cyclobenzaprine (FLEXERIL) 5 MG tablet Take 5 mg by mouth 3 (three) times daily as needed for muscle spasms.  Marland Kitchen ezetimibe (ZETIA) 10 MG tablet Take 1 tablet (10 mg total) by mouth daily.  . fenofibrate 160 MG tablet Take 160 mg by mouth at bedtime.   Marland Kitchen FLUoxetine (PROZAC) 20 MG tablet Take 20 mg by mouth daily.  . isosorbide mononitrate (IMDUR) 30 MG 24 hr tablet Take 1 tablet (30 mg total) by mouth daily.  . meclizine (ANTIVERT) 25 MG tablet Take 25 mg by  mouth 3 (three) times daily as needed for dizziness.  . metoprolol tartrate (LOPRESSOR) 25 MG tablet Take 0.5 tablets (12.5 mg total) by mouth 2 (two) times daily.  Marland Kitchen morphine (MSIR) 30 MG tablet Take 30 mg by mouth daily.   . nitroGLYCERIN (NITROSTAT) 0.4 MG SL tablet PLACE 1 TABLET UNDER THE TONGUE EVERY 5 MINUTES AS NEEDED FOR CHEST PAIN  . omega-3 acid ethyl esters (LOVAZA) 1 G capsule Take 2 g by mouth 2 (two) times daily.  . ondansetron (ZOFRAN) 4 MG tablet Take 4 mg by mouth every 8 (eight) hours as needed for nausea or vomiting.  Marland Kitchen OVER THE COUNTER MEDICATION Take 2 capsules by mouth daily at 6 PM. "Relizen" herbal supplement for hormone replacement   . Polyethyl Glycol-Propyl Glycol (SYSTANE OP) Place 1-2 drops into both eyes daily as needed (for red/itchy eyes).   Marland Kitchen rOPINIRole (REQUIP) 0.5 MG tablet Take 0.5 mg by mouth at bedtime.   Marland Kitchen scopolamine (TRANSDERM-SCOP) 1 MG/3DAYS  Place 1 patch onto the skin as needed (for travel).  . simvastatin (ZOCOR) 40 MG tablet TAKE 1 TABLET BY MOUTH ONCE DAILY AT 6 PM  . SYNTHROID 150 MCG tablet Take 1 tablet by mouth daily.  . [DISCONTINUED] levothyroxine (SYNTHROID, LEVOTHROID) 137 MCG tablet Take 150 mcg by mouth daily before breakfast.      Allergies:   Codeine and Erythromycin   Social History   Tobacco Use  . Smoking status: Never Smoker  . Smokeless tobacco: Never Used  Vaping Use  . Vaping Use: Never used  Substance Use Topics  . Alcohol use: No  . Drug use: No     Family Hx: [name] family history includes Breast cancer in her maternal aunt and paternal aunt; Cancer in her maternal aunt; Congestive Heart Failure in her mother; Coronary artery disease in her father; Hypertension in her father and mother; Stroke in her brother.  ROS:   Please see the history of present illness.     All other systems reviewed and are negative.   Prior CV studies:   The following studies were reviewed today:  Carotid 04/05/18  No  obstructive dx  Cath 02/11/18  Conclusion   1. Moderate 2 vessel CAD involving the LCx and RCA and chronic occlusion of the distal LCx collateralized from L--->L collaterals 2. Patent left main and LAD with mild nonobstructive disease 3. Low LVEDP 4. Negative FFR of the left circumflex  Recommend: continued medical therapy. There is progressive stenosis of the right PDA but this vessel is diffusely diseased and not favorable for PCI.  Procedure complicated by hypotension after hemostasis is obtained. 0.5 mg atropine and IV fluids are administered. Groin site is tender into the right lower quadrant. Plan stat non-contrast CT.     Labs/Other Tests and Data Reviewed:    EKG:   03/29/18 SR rate 66 low voltage 05/17/19 SR PVC/PAC otherwise normal   Recent Labs: No results found for requested labs within last 8760 hours.   Recent Lipid Panel Lab Results  Component Value Date/Time   CHOL 139 02/13/2018 04:50 AM   CHOL 138 11/18/2016 08:59 AM   TRIG 75 02/13/2018 04:50 AM   HDL 54 02/13/2018 04:50 AM   HDL 56 11/18/2016 08:59 AM   CHOLHDL 2.6 02/13/2018 04:50 AM   LDLCALC 70 02/13/2018 04:50 AM   LDLCALC 66 11/18/2016 08:59 AM    Wt Readings from Last 3 Encounters:  11/20/19 162 lb 6.4 oz (73.7 kg)  05/17/19 179 lb 12.8 oz (81.6 kg)  11/08/18 183 lb 12.8 oz (83.4 kg)     Objective:    Vital Signs:  BP 140/80   Pulse 62   Ht 5\' 3"  (1.6 m)   Wt 162 lb 6.4 oz (73.7 kg)   BMI 28.77 kg/m    No distress No rash No tachypnea No JVP elevation  No edema    ASSESSMENT & PLAN:    1. Hypotension/dizziness -  resolved ? Related to vertigo improved  2. CAD - stable stenting mid circumflex with occluded distal circumflex collateralized and diffuse PDA disease Back on coreg and nitrates  3.   Retroperitoneal hematoma -02/2018 - resolved Hct 40.6 07/2018             Consider left radial cath next time 4.   HLD:  Continue statin labs with primary  5.  Thyroid:  Medication change  and f/u labs per primary   COVID-19 Education: The signs and symptoms of COVID-19 were  discussed with the patient and how to seek care for testing (follow up with PCP or arrange E-visit).  The importance of social distancing was discussed today.   Medication Adjustments/Labs and Tests Ordered: Current medicines are reviewed at length with the patient today.  Concerns regarding medicines are outlined above.   Tests Ordered:  None   Medication Changes:  None   Disposition:  Follow up  6 months   Time spent reviewing chart previous angiogram films, direct patient interview and composing note 20 minutes   Signed, Jenkins Rouge, MD  11/20/2019 9:42 AM    [place]

## 2019-11-20 ENCOUNTER — Telehealth (INDEPENDENT_AMBULATORY_CARE_PROVIDER_SITE_OTHER): Payer: Medicare Other | Admitting: Cardiovascular Disease

## 2019-11-20 VITALS — BP 140/80 | HR 62 | Ht 63.0 in | Wt 162.4 lb

## 2019-11-20 DIAGNOSIS — I2510 Atherosclerotic heart disease of native coronary artery without angina pectoris: Secondary | ICD-10-CM | POA: Diagnosis not present

## 2019-11-20 NOTE — Patient Instructions (Signed)

## 2020-05-14 ENCOUNTER — Other Ambulatory Visit: Payer: Self-pay | Admitting: Cardiovascular Disease

## 2020-05-19 ENCOUNTER — Other Ambulatory Visit: Payer: Self-pay | Admitting: Cardiovascular Disease

## 2020-05-21 NOTE — Progress Notes (Signed)
Cardiology Office Note   Date:  05/22/2020   ID:  Brittany Mason, DOB 07/08/1950, MRN [id number]  PCP:  Hulan Fess, MD  Cardiologist:  Dr. Johnsie Cancel, MD   Chief Complaint  Patient presents with  . Follow-up      History of Present Illness: Brittany Mason is a 69 y.o. female who presents for 57-month follow-up, seen for Dr.  Jamesetta So. Lehew has a history of CAD/VF arrest in 2017 with subsequent PCI of the LCx with residual occluded distal disease.  Repeat cardiac cath 02/2018 with patent LCx stent and chronically occluded distal LCx disease with left to left collaterals and diffusely diseased right PDA not amenable to PCI.  Recommendations were for medical treatment.  Post-cath setting was complicated by retroperitoneal hematoma however fortunately there was no surgery required.  Also with a history of migraines and RLS.  She was seen 03/29/2018 for dizziness at which time carotid Dopplers were performed which showed no stenosis.  Today she presents for follow up and states that she is doing well from a CV standpoint. She has had two episodes of chest pain in the last 6 months for which she will take SL NTG with resolution. She states that this is her typical CP pattern. There has been no significant change. She denies SOB, LE edema, fatigue, palpitations, dizziness or syncope. Her BP is soft today but she reports she is always on the lower side. HR also runs low which is normal for her.   Past Medical History:  Diagnosis Date  . Acute pulmonary embolism ([place]) 06/20/2015  . Arthritis    osteoarthritis-knees. Chronic back pain  . Basal cell carcinoma of right shoulder    "burned off"  . CAD in native artery    a. V fib arrest 2017 s/p DES to LCx with occluded dCx. b. repeat cath 02/2018 treated medically (see report) - c/b RPH.  . Chronic combined systolic and diastolic HF (heart failure) ([place]) 02/13/2018  . Chronic SI joint pain   . DVT (deep venous thrombosis) ([place]) 04/2013   left  lower leg, resulted in Pulmonary emboli on hormone therapy  . Dyspnea on exertion   . GERD (gastroesophageal reflux disease)   . Hypercholesterolemia   . Hypothyroidism   . Intramuscular hematoma 02/13/2018  . Migraine    "< once/month" (12/04/2015)  . MVP (mitral valve prolapse)    asymptomatic  . Myocardial infarction ([place]) 11/19/2015   cardiac arrest  . PE (pulmonary embolism) Apr 18, 2013   tx. -using Xarelto now, no further lung problems, denies SOB on 01-02-14  . PONV (postoperative nausea and vomiting)   . Retroperitoneal hematoma   . RLS (restless legs syndrome)     Past Surgical History:  Procedure Laterality Date  . ANTERIOR CRUCIATE LIGAMENT REPAIR Left 1976; 1981   "open"  . CARDIAC CATHETERIZATION N/A 11/20/2015   Procedure: Left Heart Cath and Coronary Angiography;  Surgeon: Belva Crome, MD;  Location: [place] [facility];  Service: Cardiovascular;  Laterality: N/A;  . CARDIAC CATHETERIZATION N/A 12/04/2015   Procedure: Left Heart Cath and Coronary Angiography;  Surgeon: Peter M Martinique, MD;  Location: [place] [facility];  Service: Cardiovascular;  Laterality: N/A;  . CARDIAC CATHETERIZATION  12/04/2015   Procedure: Coronary Stent Intervention;  Surgeon: Peter M Martinique, MD;  Location: [place] [facility];  Service: Cardiovascular;;  . COLONOSCOPY  09/28/2011   Procedure: COLONOSCOPY;  Surgeon: Juanita Craver, MD;  Location: [facility];  Service:  Endoscopy;  Laterality: N/A;  . COLONOSCOPY WITH PROPOFOL N/A 01/05/2017   Procedure: COLONOSCOPY WITH PROPOFOL;  Surgeon: Juanita Craver, MD;  [facility]: [facility];  Service: Endoscopy;  Laterality: N/A;  . INTRAVASCULAR PRESSURE WIRE/FFR STUDY N/A 02/11/2018   Procedure: INTRAVASCULAR PRESSURE WIRE/FFR STUDY;  Surgeon: Sherren Mocha, MD;  Location: [place] [facility];  Service: Cardiovascular;  Laterality: N/A;  . JOINT REPLACEMENT    . LEFT HEART CATH AND CORONARY ANGIOGRAPHY N/A 10/08/2016   Procedure: Left Heart Cath and Coronary  Angiography;  Surgeon: Peter M Martinique, MD;  Location: [place] [facility];  Service: Cardiovascular;  Laterality: N/A;  . LEFT HEART CATH AND CORONARY ANGIOGRAPHY N/A 02/11/2018   Procedure: LEFT HEART CATH AND CORONARY ANGIOGRAPHY;  Surgeon: Sherren Mocha, MD;  Location: [place] [facility];  Service: Cardiovascular;  Laterality: N/A;  . TOTAL ABDOMINAL HYSTERECTOMY  1998  . TOTAL KNEE ARTHROPLASTY Left 01/08/2014   Procedure: LEFT TOTAL KNEE ARTHROPLASTY;  Surgeon: Gearlean Alf, MD;  [facility]: [facility];  Service: Orthopedics;  Laterality: Left;     Current Outpatient Medications  Medication Sig Dispense Refill  . aspirin EC 81 MG tablet Take 81 mg by mouth daily with lunch.    . butalbital-acetaminophen-caffeine (FIORICET, ESGIC) 50-325-40 MG tablet Take 1 tablet by mouth 2 (two) times daily as needed for headache.    . clopidogrel (PLAVIX) 75 MG tablet TAKE 1 TABLET(75 MG) BY MOUTH DAILY 90 tablet 2  . cyclobenzaprine (FLEXERIL) 5 MG tablet Take 5 mg by mouth 3 (three) times daily as needed for muscle spasms.    Marland Kitchen ezetimibe (ZETIA) 10 MG tablet TAKE ONE TABLET BY MOUTH DAILY 90 tablet 1  . fenofibrate 160 MG tablet Take 160 mg by mouth at bedtime.     Marland Kitchen FLUoxetine (PROZAC) 20 MG tablet Take 20 mg by mouth daily.    . isosorbide mononitrate (IMDUR) 30 MG 24 hr tablet Take 15 mg by mouth daily.    . meclizine (ANTIVERT) 25 MG tablet Take 25 mg by mouth 3 (three) times daily as needed for dizziness.    . metoprolol tartrate (LOPRESSOR) 25 MG tablet TAKE 1/2 TABLET(12.5 MG) BY MOUTH TWICE DAILY 90 tablet 2  . morphine (MSIR) 30 MG tablet Take 30 mg by mouth daily.    . nitroGLYCERIN (NITROSTAT) 0.4 MG SL tablet PLACE 1 TABLET UNDER THE TONGUE EVERY 5 MINUTES AS NEEDED FOR CHEST PAIN 25 tablet 6  . omega-3 acid ethyl esters (LOVAZA) 1 G capsule Take 2 g by mouth 2 (two) times daily.    . ondansetron (ZOFRAN) 4 MG tablet Take 4 mg by mouth every 8 (eight) hours as needed for nausea or vomiting.     Marland Kitchen OVER THE COUNTER MEDICATION Take 2 capsules by mouth daily at 6 PM. "Relizen" herbal supplement for hormone replacement    . Polyethyl Glycol-Propyl Glycol (SYSTANE OP) Place 1-2 drops into both eyes daily as needed (for red/itchy eyes).     Marland Kitchen rOPINIRole (REQUIP) 0.5 MG tablet Take 0.5 mg by mouth at bedtime.     Marland Kitchen scopolamine (TRANSDERM-SCOP) 1 MG/3DAYS Place 1 patch onto the skin as needed (for travel).    . simvastatin (ZOCOR) 40 MG tablet TAKE 1 TABLET BY MOUTH ONCE DAILY AT 6 PM 90 tablet 2  . SYNTHROID 150 MCG tablet Take 1 tablet by mouth daily.     No current facility-administered medications for this visit.    Allergies:   Atorvastatin, Codeine, and Erythromycin  Social History:  The patient  reports that she has never smoked. She has never used smokeless tobacco. She reports that she does not drink alcohol and does not use drugs.   Family History:  [name] family history includes Breast cancer in her maternal aunt and paternal aunt; Cancer in her maternal aunt; Congestive Heart Failure in her mother; Coronary artery disease in her father; Hypertension in her father and mother; Stroke in her brother.    ROS:  Please see the history of present illness.   Otherwise, review of systems are positive for none.   All other systems are reviewed and negative.    PHYSICAL EXAM: VS:  BP (!) 94/50   Pulse (!) 48   Ht 5\' 3"  (1.6 m)   Wt 163 lb (73.9 kg)   SpO2 93%   BMI 28.87 kg/m  , BMI Body mass index is 28.87 kg/m.   General: Well developed, well nourished, NAD Neck: Negative for carotid bruits. No JVD Lungs:Clear to ausculation bilaterally. No wheezes, rales, or rhonchi. Breathing is unlabored. Cardiovascular: RRR with S1 S2. No murmurs Extremities: No edema.  Neuro: Alert and oriented. No focal deficits. No facial asymmetry. MAE spontaneously. Psych: Responds to questions appropriately with normal affect.     EKG:  EKG is ordered today. The ekg ordered today  demonstrates SB with HR 48bpm, no change from prior    Recent Labs: No results found for requested labs within last 8760 hours.    Lipid Panel    Component Value Date/Time   CHOL 139 02/13/2018 0450   CHOL 138 11/18/2016 0859   TRIG 75 02/13/2018 0450   HDL 54 02/13/2018 0450   HDL 56 11/18/2016 0859   CHOLHDL 2.6 02/13/2018 0450   VLDL 15 02/13/2018 0450   LDLCALC 70 02/13/2018 0450   LDLCALC 66 11/18/2016 0859      Wt Readings from Last 3 Encounters:  05/22/20 163 lb (73.9 kg)  11/20/19 162 lb 6.4 oz (73.7 kg)  05/17/19 179 lb 12.8 oz (81.6 kg)    Other studies Reviewed: Additional studies/ records that were reviewed today include:  Review of the above records demonstrates:   Carotid 04/05/18  No obstructive dx  Cath 02/11/18  Conclusion   1. Moderate 2 vessel CAD involving the LCx and RCA and chronic occlusion of the distal LCx collateralized from L--->L collaterals 2. Patent left main and LAD with mild nonobstructive disease 3. Low LVEDP 4. Negative FFR of the left circumflex  Recommend: continued medical therapy. There is progressive stenosis of the right PDA but this vessel is diffusely diseased and not favorable for PCI.  Procedure complicated by hypotension after hemostasis is obtained. 0.5 mg atropine and IV fluids are administered. Groin site is tender into the right lower quadrant. Plan stat non-contrast CT.    ASSESSMENT AND PLAN:  1.  Hypotension/dizziness: -BP is soft today however she reports no dizziness or syncope. She states that she likely needs to increase her plain water consumption. She will monitor for symptoms and contact the office is any dizziness or syncope occurs.   2.  CAD: -Last LHC with stable disease with patent PCI to mid LCx and chronically occluded distal LCx disease.  She has left to left collaterals with diffuse PDA disease -Very infrequent CP relieved with SL NTG>>no change since last seen  -If progression, will need to  notify office  -Continue carvedilol, nitrates  3. History of retroperitoneal hematoma post cardiac catheterization: -Occurred post [facility] 02/2018  4.  HLD: -Continue atorvastatin -Labs per PCP  5.  Hypothyroidism: -Follows with PCP   Current medicines are reviewed at length with the patient today.  The patient does not have concerns regarding medicines.  The following changes have been made:  no change  Labs/ tests ordered today include: BMET  No orders of the defined types were placed in this encounter.  Disposition:   FU with Dr. Johnsie Cancel in 6 months  Signed, Kathyrn Drown, NP  05/22/2020 2:23 PM    [facility] [place], [place], [place]  [address] Phone: [phone]; Fax: [phone]

## 2020-05-22 ENCOUNTER — Other Ambulatory Visit: Payer: Self-pay

## 2020-05-22 ENCOUNTER — Encounter: Payer: Self-pay | Admitting: Cardiology

## 2020-05-22 ENCOUNTER — Ambulatory Visit (INDEPENDENT_AMBULATORY_CARE_PROVIDER_SITE_OTHER): Payer: Medicare Other | Admitting: Cardiology

## 2020-05-22 VITALS — BP 94/50 | HR 48 | Ht 63.0 in | Wt 163.0 lb

## 2020-05-22 DIAGNOSIS — E785 Hyperlipidemia, unspecified: Secondary | ICD-10-CM | POA: Diagnosis not present

## 2020-05-22 DIAGNOSIS — I959 Hypotension, unspecified: Secondary | ICD-10-CM

## 2020-05-22 DIAGNOSIS — I2510 Atherosclerotic heart disease of native coronary artery without angina pectoris: Secondary | ICD-10-CM | POA: Diagnosis not present

## 2020-05-22 NOTE — Patient Instructions (Signed)
Medication Instructions:  Your physician recommends that you continue on your current medications as directed. Please refer to the Current Medication list given to you today.  *If you need a refill on your cardiac medications before your next appointment, please call your pharmacy*   Lab Work: TODAY: BMET If you have labs (blood work) drawn today and your tests are completely normal, you will receive your results only by: Marland Kitchen MyChart Message (if you have MyChart) OR . A paper copy in the mail If you have any lab test that is abnormal or we need to change your treatment, we will call you to review the results.   Testing/Procedures: NONE   Follow-Up: At [facility], you and your health needs are our priority.  As part of our continuing mission to provide you with exceptional heart care, we have created designated Provider Care Teams.  These Care Teams include your primary Cardiologist (physician) and Advanced Practice Providers (APPs -  Physician Assistants and Nurse Practitioners) who all work together to provide you with the care you need, when you need it.  We recommend signing up for the patient portal called "MyChart".  Sign up information is provided on this After Visit Summary.  MyChart is used to connect with patients for Virtual Visits (Telemedicine).  Patients are able to view lab/test results, encounter notes, upcoming appointments, etc.  Non-urgent messages can be sent to your provider as well.   To learn more about what you can do with MyChart, go to [email or web address].    Your next appointment:   6 month(s)  The format for your next appointment:   In Person  Provider:   You may see Jenkins Rouge, MD or one of the following Advanced Practice Providers on your designated Care Team:    Truitt Merle, NP  Cecilie Kicks, NP  Kathyrn Drown, NP

## 2020-05-23 ENCOUNTER — Encounter: Payer: Self-pay | Admitting: Physical Therapy

## 2020-05-23 ENCOUNTER — Other Ambulatory Visit: Payer: Self-pay

## 2020-05-23 ENCOUNTER — Ambulatory Visit: Payer: Medicare Other | Attending: Family Medicine | Admitting: Physical Therapy

## 2020-05-23 DIAGNOSIS — M25521 Pain in right elbow: Secondary | ICD-10-CM | POA: Insufficient documentation

## 2020-05-23 DIAGNOSIS — M6281 Muscle weakness (generalized): Secondary | ICD-10-CM

## 2020-05-23 LAB — BASIC METABOLIC PANEL
BUN/Creatinine Ratio: 18 (ref 12–28)
BUN: 16 mg/dL (ref 8–27)
CO2: 24 mmol/L (ref 20–29)
Calcium: 9.8 mg/dL (ref 8.7–10.3)
Chloride: 103 mmol/L (ref 96–106)
Creatinine, Ser: 0.89 mg/dL (ref 0.57–1.00)
GFR calc Af Amer: 78 mL/min/{1.73_m2} (ref >59)
GFR calc non Af Amer: 67 mL/min/{1.73_m2} (ref >59)
Glucose: 94 mg/dL (ref 65–99)
Potassium: 4.5 mmol/L (ref 3.5–5.2)
Sodium: 140 mmol/L (ref 134–144)

## 2020-05-23 NOTE — Therapy (Signed)
[facility] [address] [place], [place], [address] Phone: [phone]   Fax:  [phone]  Physical Therapy Evaluation  Patient Details  Name: Brittany Mason MRN: [id number] Date of Birth: July 13, 1950 Referring Provider (PT): Dr. Hulan Fess   Encounter Date: 05/23/2020   PT End of Session - 05/23/20 2002    Visit Number 1    Date for PT Re-Evaluation 07/18/20    Authorization Type Medicare BCBS    PT Start Time 1100    PT Stop Time 1143    PT Time Calculation (min) 43 min    Activity Tolerance Patient tolerated treatment well           Past Medical History:  Diagnosis Date  . Acute pulmonary embolism ([place]) 06/20/2015  . Arthritis    osteoarthritis-knees. Chronic back pain  . Basal cell carcinoma of right shoulder    "burned off"  . CAD in native artery    a. V fib arrest 2017 s/p DES to LCx with occluded dCx. b. repeat cath 02/2018 treated medically (see report) - c/b RPH.  . Chronic combined systolic and diastolic HF (heart failure) ([place]) 02/13/2018  . Chronic SI joint pain   . DVT (deep venous thrombosis) ([place]) 04/2013   left lower leg, resulted in Pulmonary emboli on hormone therapy  . Dyspnea on exertion   . GERD (gastroesophageal reflux disease)   . Hypercholesterolemia   . Hypothyroidism   . Intramuscular hematoma 02/13/2018  . Migraine    "< once/month" (12/04/2015)  . MVP (mitral valve prolapse)    asymptomatic  . Myocardial infarction ([place]) 11/19/2015   cardiac arrest  . PE (pulmonary embolism) Apr 18, 2013   tx. -using Xarelto now, no further lung problems, denies SOB on 01-02-14  . PONV (postoperative nausea and vomiting)   . Retroperitoneal hematoma   . RLS (restless legs syndrome)     Past Surgical History:  Procedure Laterality Date  . ANTERIOR CRUCIATE LIGAMENT REPAIR Left 1976; 1981   "open"  . CARDIAC CATHETERIZATION N/A 11/20/2015   Procedure: Left Heart Cath and Coronary  Angiography;  Surgeon: Belva Crome, MD;  Location: [place] [facility];  Service: Cardiovascular;  Laterality: N/A;  . CARDIAC CATHETERIZATION N/A 12/04/2015   Procedure: Left Heart Cath and Coronary Angiography;  Surgeon: Peter M Martinique, MD;  Location: [place] [facility];  Service: Cardiovascular;  Laterality: N/A;  . CARDIAC CATHETERIZATION  12/04/2015   Procedure: Coronary Stent Intervention;  Surgeon: Peter M Martinique, MD;  Location: [place] [facility];  Service: Cardiovascular;;  . COLONOSCOPY  09/28/2011   Procedure: COLONOSCOPY;  Surgeon: Juanita Craver, MD;  [facility];  Service: Endoscopy;  Laterality: N/A;  . COLONOSCOPY WITH PROPOFOL N/A 01/05/2017   Procedure: COLONOSCOPY WITH PROPOFOL;  Surgeon: Juanita Craver, MD;  [facility];  Service: Endoscopy;  Laterality: N/A;  . INTRAVASCULAR PRESSURE WIRE/FFR STUDY N/A 02/11/2018   Procedure: INTRAVASCULAR PRESSURE WIRE/FFR STUDY;  Surgeon: Sherren Mocha, MD;  Location: [place] [facility];  Service: Cardiovascular;  Laterality: N/A;  . JOINT REPLACEMENT    . LEFT HEART CATH AND CORONARY ANGIOGRAPHY N/A 10/08/2016   Procedure: Left Heart Cath and Coronary Angiography;  Surgeon: Peter M Martinique, MD;  Location: [place] [facility];  Service: Cardiovascular;  Laterality: N/A;  . LEFT HEART CATH AND CORONARY ANGIOGRAPHY N/A 02/11/2018   Procedure: LEFT HEART CATH AND CORONARY ANGIOGRAPHY;  Surgeon: Sherren Mocha, MD;  Location: [place] [facility];  Service: Cardiovascular;  Laterality: N/A;  . TOTAL ABDOMINAL HYSTERECTOMY  1998  . TOTAL KNEE ARTHROPLASTY Left 01/08/2014   Procedure: LEFT TOTAL KNEE ARTHROPLASTY;  Surgeon: Gearlean Alf, MD;  [facility];  Service: Orthopedics;  Laterality: Left;    There were no vitals filed for this visit.    Subjective Assessment - 05/23/20 1102    Subjective Right elbow pain with lifting water jugs this summer.  About a month ago played tennis it got really inflammed.  Can't take  antiinflammatories.  Right handed.  Wore brace to play pickleball.    Pertinent History Heart attack 3 years ago;  keeps nitroglycerin at all times in left pocket    Limitations House hold activities    Diagnostic tests none    Patient Stated Goals play tennis again    Currently in Pain? Yes    Pain Score 3     Pain Location Elbow    Pain Orientation Right    Pain Type Chronic pain    Pain Onset More than a month ago    Pain Frequency Intermittent    Aggravating Factors  using remote on tv; lifting a plate;    Pain Relieving Factors nothing really; ice;              [facility] PT Assessment - 05/23/20 0001      Assessment   Medical Diagnosis tendonitis right elbow    Referring Provider (PT) Dr. Hulan Fess    Onset Date/Surgical Date --   Summer   Hand Dominance Right    Next MD Visit as needed    Prior Therapy with [facility]      Precautions   Precautions None      Restrictions   Weight Bearing Restrictions No      Balance Screen   Has the patient fallen in the past 6 months No    Has the patient had a decrease in activity level because of a fear of falling?  No    Is the patient reluctant to leave their home because of a fear of falling?  No      Home Ecologist residence      Prior Function   Level of Independence Independent    Vocation Retired    Leisure pickleball, tennis, read      Observation/Other Assessments   Focus on Therapeutic Outcomes (FOTO)  55% limitation      AROM   Overall AROM Comments limited cervical extension and bil sidebending;shoulder, wrist and hand WNLs;  pronation/supination WNLs    Right Elbow Flexion 150    Right Elbow Extension 0   discomfort at endrange   Left Elbow Flexion 150    Left Elbow Extension 0      Strength   Overall Strength Comments painful but normal strength with wrist extension    Right Elbow Flexion 5/5    Right Elbow Extension 5/5    Right Hand Grip (lbs) 40# bent elbow; 52#  straight elbow painful    Left Hand Grip (lbs) 50# bent elbow 55# straight elbow      Palpation   Palpation comment markedly tender right lateral elbow epicondyle      Special Tests   Other special tests painful resisted middle finger extension                      Objective measurements completed on examination: See above findings.  PT Education - 05/23/20 2001    Education Details wrist extensor stretch;  eccentric extensor strengthening; use of elbow brace for ADLs    Person(s) Educated Patient    Methods Explanation;Demonstration;Handout    Comprehension Returned demonstration;Verbalized understanding            PT Short Term Goals - 05/23/20 2013      PT SHORT TERM GOAL #1   Title indpendent with initial HEP    Time 4    Period Weeks    Status New    Target Date 06/20/20      PT SHORT TERM GOAL #2   Title The patient will report a 30% improvement in right lateral elbow pain with using the remote, lifting a plate    Time 4    Period Weeks    Status New      PT SHORT TERM GOAL #3   Title Right grip with bent elbow improved to 45# needed for opening jars    Time 4    Period Weeks             PT Long Term Goals - 05/23/20 2015      PT LONG TERM GOAL #1   Title independent with HEP    Time 8    Period Weeks    Status New    Target Date 07/18/20      PT LONG TERM GOAL #2   Title The patient will report a 60% reduction in right elbow pain with using the remote, lifting a plate, and playing tennis and pickleball    Time 8    Period Weeks    Status New      PT LONG TERM GOAL #3   Title Right grip strength improved to 50# needed for opening jars    Time 8    Period Weeks    Status New      PT LONG TERM GOAL #4   Title FOTO functional outcome score improved from 55% to 38% limitation    Time 8    Period Weeks    Status New      PT LONG TERM GOAL #5   Title .Marland KitchenMarland Kitchen                  Plan - 05/23/20 2002     Clinical Impression Statement The patient reports the onset of right lateral elbow pain (right UE dominant) in August after carrying water jugs to water the plants.  She reports a worsening of symptoms after playing tennis.  She reports the pain has not gotten better and worsened with using the TV remote, lifting a plate, and sometimes at night time.  She has not attempted to play tennis or pickleball recently.  Shoulder, elbow, wrist and hand ROM WFLs however she has long standing neck stiffness.  Tenderness around right lateral elbow epicondyle.  Painful with resisted wrist extension and middle finger extension.  Decreased grip strength on right vs. left.  She would benefit from PT to address these deficits.    Personal Factors and Comorbidities Comorbidity 1;Comorbidity 2;Comorbidity 3+    Comorbidities Cardiac history (keeps nitroglycerin in left pocket at all times); OA; history of neck and back pain    Examination-Activity Limitations Other;Lift    Examination-Participation Restrictions Meal Prep;Cleaning;Other    Stability/Clinical Decision Making Stable/Uncomplicated    Clinical Decision Making Low    Rehab Potential Good    PT Frequency 2x / week    PT Duration  8 weeks    PT Treatment/Interventions ADLs/Self Care Home Management;Iontophoresis 4mg /ml Dexamethasone;Electrical Stimulation;Cryotherapy;Moist Heat;Ultrasound;Neuromuscular re-education;Therapeutic exercise;Therapeutic activities;Patient/family education;Manual techniques;Dry needling;Joint Manipulations;Taping    PT Next Visit Plan ionto if cert signed;  review eccentrics of wrist extensors and stretching as needed; manual therapy including mobs with movement and soft tissue work to wrist extensor wad;  DN as last resort (had in the past for neck)    PT Home Exercise Plan [id number]    Consulted and Agree with Plan of Care Patient           Patient will benefit from skilled therapeutic intervention in order to improve the  following deficits and impairments:  Pain,Impaired UE functional use,Decreased strength  Visit Diagnosis: Pain in right elbow - Plan: PT plan of care cert/re-cert  Muscle weakness (generalized) - Plan: PT plan of care cert/re-cert     Problem List Patient Active Problem List   Diagnosis Date Noted  . Acute blood loss anemia transfused 2 U PRBCs 02/11/18 02/13/2018  . Chronic combined systolic and diastolic HF (heart failure) ([place]) 02/13/2018  . Intramuscular hematoma 02/13/2018  . Retroperitoneal hemorrhage complicating cardiac catheterization 02/11/2018  . CAD in native artery 12/04/2015  . Cardiomyopathy, ischemic 12/04/2015  . CAD (coronary artery disease), native coronary artery 12/04/2015  . History of pulmonary embolism 11/25/2015  . Lactic acidosis 11/25/2015  . AKI (acute kidney injury) ([place]) 11/25/2015  . Aspiration pneumonia ([place]) 11/25/2015  . Hyperglycemia 11/25/2015  . Normocytic anemia 11/25/2015  . Acute respiratory failure with hypoxia ([place])   . Cardiogenic shock ([place])   . NSTEMI (non-ST elevated myocardial infarction) ([place])   . Cardiac arrest ([place]) 11/19/2015  . Angina pectoris ([place]) 08/28/2015  . Retinal detachment 05/07/2014  . OA (osteoarthritis) of knee 01/08/2014  . Preoperative respiratory examination 11/22/2013  . Pulmonary embolism ([place]) 04/18/2013  . Chronic back pain 04/18/2013  . Dyspnea on exertion 04/06/2013  . 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 Hypothyroidism   . Hypercholesterolemia    Brittany Mason, PT 05/23/20 8:20 PM Phone: [phone] Fax: [phone] Brittany Mason 05/23/2020, 8:19 PM  [facility] [address] [place], [place], [address] Phone: [phone]   Fax:  [phone]  Name: Brittany Mason MRN: [id number] Date of Birth: April 04, 1951

## 2020-05-23 NOTE — Patient Instructions (Signed)
Access Code: [email or web address] URL: [email or web address] Date: 05/23/2020 Prepared by: Ruben Im  Exercises Standing Wrist Flexion Stretch - 1 x daily - 7 x weekly - 1 sets - 10 reps Seated Eccentric Wrist Extension - 1-2 x daily - 7 x weekly - 2 sets - 10 reps

## 2020-05-27 ENCOUNTER — Encounter: Payer: Self-pay | Admitting: Physical Therapy

## 2020-05-27 ENCOUNTER — Ambulatory Visit: Payer: Medicare Other | Admitting: Physical Therapy

## 2020-05-27 ENCOUNTER — Other Ambulatory Visit: Payer: Self-pay

## 2020-05-27 DIAGNOSIS — M25521 Pain in right elbow: Secondary | ICD-10-CM | POA: Diagnosis not present

## 2020-05-27 DIAGNOSIS — M6281 Muscle weakness (generalized): Secondary | ICD-10-CM

## 2020-05-27 NOTE — Therapy (Signed)
[facility] [address] [place], [place], [address] Phone: [phone]   Fax:  [phone]  Physical Therapy Treatment  Patient Details  Name: Brittany Mason MRN: [id number] Date of Birth: [birth year] Referring Provider (PT): Dr. Hulan Fess   Encounter Date: 05/27/2020   PT End of Session - 05/27/20 1401    Visit Number 2    Date for PT Re-Evaluation 07/18/20    Authorization Type Medicare BCBS    PT Start Time 1401    PT Stop Time 1440    PT Time Calculation (min) 39 min    Activity Tolerance Patient tolerated treatment well    Behavior During Therapy [facility] for tasks assessed/performed           Past Medical History:  Diagnosis Date  . Acute pulmonary embolism ([place]) 06/20/2015  . Arthritis    osteoarthritis-knees. Chronic back pain  . Basal cell carcinoma of right shoulder    "burned off"  . CAD in native artery    a. V fib arrest 2017 s/p DES to LCx with occluded dCx. b. repeat cath 02/2018 treated medically (see report) - c/b RPH.  . Chronic combined systolic and diastolic HF (heart failure) ([place]) 02/13/2018  . Chronic SI joint pain   . DVT (deep venous thrombosis) ([place]) 04/2013   left lower leg, resulted in Pulmonary emboli on hormone therapy  . Dyspnea on exertion   . GERD (gastroesophageal reflux disease)   . Hypercholesterolemia   . Hypothyroidism   . Intramuscular hematoma 02/13/2018  . Migraine    "< once/month" (12/04/2015)  . MVP (mitral valve prolapse)    asymptomatic  . Myocardial infarction ([place]) 11/19/2015   cardiac arrest  . PE (pulmonary embolism) Apr 18, 2013   tx. -using Xarelto now, no further lung problems, denies SOB on 01-02-14  . PONV (postoperative nausea and vomiting)   . Retroperitoneal hematoma   . RLS (restless legs syndrome)     Past Surgical History:  Procedure Laterality Date  . ANTERIOR CRUCIATE LIGAMENT REPAIR Left 1976; 1981   "open"  . CARDIAC CATHETERIZATION N/A  11/20/2015   Procedure: Left Heart Cath and Coronary Angiography;  Surgeon: Belva Crome, MD;  Location: [place] [facility];  Service: Cardiovascular;  Laterality: N/A;  . CARDIAC CATHETERIZATION N/A 12/04/2015   Procedure: Left Heart Cath and Coronary Angiography;  Surgeon: Peter M Martinique, MD;  Location: [place] [facility];  Service: Cardiovascular;  Laterality: N/A;  . CARDIAC CATHETERIZATION  12/04/2015   Procedure: Coronary Stent Intervention;  Surgeon: Peter M Martinique, MD;  Location: [place] [facility];  Service: Cardiovascular;;  . COLONOSCOPY  09/28/2011   Procedure: COLONOSCOPY;  Surgeon: Juanita Craver, MD;  [facility];  Service: Endoscopy;  Laterality: N/A;  . COLONOSCOPY WITH PROPOFOL N/A 01/05/2017   Procedure: COLONOSCOPY WITH PROPOFOL;  Surgeon: Juanita Craver, MD;  [facility];  Service: Endoscopy;  Laterality: N/A;  . INTRAVASCULAR PRESSURE WIRE/FFR STUDY N/A 02/11/2018   Procedure: INTRAVASCULAR PRESSURE WIRE/FFR STUDY;  Surgeon: Sherren Mocha, MD;  Location: [place] [facility];  Service: Cardiovascular;  Laterality: N/A;  . JOINT REPLACEMENT    . LEFT HEART CATH AND CORONARY ANGIOGRAPHY N/A 10/08/2016   Procedure: Left Heart Cath and Coronary Angiography;  Surgeon: Peter M Martinique, MD;  Location: [place] [facility];  Service: Cardiovascular;  Laterality: N/A;  . LEFT HEART CATH AND CORONARY ANGIOGRAPHY N/A 02/11/2018   Procedure: LEFT HEART CATH AND CORONARY ANGIOGRAPHY;  Surgeon: Sherren Mocha, MD;  Location: [place] [facility];  Service: Cardiovascular;  Laterality: N/A;  . TOTAL ABDOMINAL HYSTERECTOMY  1998  . TOTAL KNEE ARTHROPLASTY Left 01/08/2014   Procedure: LEFT TOTAL KNEE ARTHROPLASTY;  Surgeon: Gearlean Alf, MD;  [facility];  Service: Orthopedics;  Laterality: Left;    There were no vitals filed for this visit.   Subjective Assessment - 05/27/20 1402    Subjective Doing her stretches but no change yet.    Pertinent History Heart attack 3 years  ago;  keeps nitroglycerin at all times in left pocket    Currently in Pain? Yes    Pain Score 3     Pain Location Elbow    Pain Orientation Right    Pain Descriptors / Indicators Sore    Multiple Pain Sites No                             [facility] Adult PT Treatment/Exercise - 05/27/20 0001      Wrist Exercises   Other wrist exercises Eccentric wrist ext 10x, good return demo    Other wrist exercises Wrist extensor stretch 3x 20 sec   Wall push up + good demo     Ultrasound   Ultrasound Location RT elbow    Ultrasound Parameters 3MZ 100% 1wtcm2   10 min   Ultrasound Goals Pain      Manual Therapy   Manual Therapy Soft tissue mobilization    Soft tissue mobilization Rt wrist extensor mass                  PT Education - 05/27/20 1523    Education Details Review of ice massage/how to at home,  examples of how to shorten her lever with ADLS to reduce strain on elbow, how to massage her trigger point.    Person(s) Educated Patient    Methods Explanation    Comprehension Verbalized understanding            PT Short Term Goals - 05/23/20 2013      PT SHORT TERM GOAL #1   Title indpendent with initial HEP    Time 4    Period Weeks    Status New    Target Date 06/20/20      PT SHORT TERM GOAL #2   Title The patient will report a 30% improvement in right lateral elbow pain with using the remote, lifting a plate    Time 4    Period Weeks    Status New      PT SHORT TERM GOAL #3   Title Right grip with bent elbow improved to 45# needed for opening jars    Time 4    Period Weeks             PT Long Term Goals - 05/23/20 2015      PT LONG TERM GOAL #1   Title independent with HEP    Time 8    Period Weeks    Status New    Target Date 07/18/20      PT LONG TERM GOAL #2   Title The patient will report a 60% reduction in right elbow pain with using the remote, lifting a plate, and playing tennis and pickleball    Time 8    Period Weeks     Status New      PT LONG TERM GOAL #3   Title  Right grip strength improved to 50# needed for opening jars    Time 8    Period Weeks    Status New      PT LONG TERM GOAL #4   Title FOTO functional outcome score improved from 55% to 38% limitation    Time 8    Period Weeks    Status New      PT LONG TERM GOAL #5   Title .Marland KitchenMarland Kitchen                 Plan - 05/27/20 1415    Clinical Impression Statement Pt arrives for first time after eval visit. Pt is independent and compliant with initial HEP but has not made much of a difference yet. Pt had large active trigger point in her wrist extensor mass that reposnded well to soft tissue work. MD has not signed off on Ionto patch yet. Instead we tried ultrasound to elbow tendons and educated pt on home ice massage and shortening her lever arms with ADLS. Pt verbally understood principles.    Personal Factors and Comorbidities Comorbidity 1;Comorbidity 2;Comorbidity 3+    Comorbidities Cardiac history (keeps nitroglycerin in left pocket at all times); OA; history of neck and back pain    Examination-Activity Limitations Other;Lift    Examination-Participation Restrictions Meal Prep;Cleaning;Other    Stability/Clinical Decision Making Stable/Uncomplicated    Rehab Potential Good    PT Frequency 2x / week    PT Duration 8 weeks    PT Treatment/Interventions ADLs/Self Care Home Management;Iontophoresis 4mg /ml Dexamethasone;Electrical Stimulation;Cryotherapy;Moist Heat;Ultrasound;Neuromuscular re-education;Therapeutic exercise;Therapeutic activities;Patient/family education;Manual techniques;Dry needling;Joint Manipulations;Taping    PT Next Visit Plan Check ionto patch, continue to strengthen wrist extensors gradually/pain free. See how [place] did?    PT Home Exercise Plan [email or web address]    Consulted and Agree with Plan of Care Patient           Patient will benefit from skilled therapeutic intervention in order to improve the following deficits and  impairments:  Pain,Impaired UE functional use,Decreased strength  Visit Diagnosis: Pain in right elbow  Muscle weakness (generalized)     Problem List Patient Active Problem List   Diagnosis Date Noted  . Acute blood loss anemia transfused 2 U PRBCs 02/11/18 02/13/2018  . Chronic combined systolic and diastolic HF (heart failure) ([place]) 02/13/2018  . Intramuscular hematoma 02/13/2018  . Retroperitoneal hemorrhage complicating cardiac catheterization 02/11/2018  . CAD in native artery 12/04/2015  . Cardiomyopathy, ischemic 12/04/2015  . CAD (coronary artery disease), native coronary artery 12/04/2015  . History of pulmonary embolism 11/25/2015  . Lactic acidosis 11/25/2015  . AKI (acute kidney injury) ([place]) 11/25/2015  . Aspiration pneumonia ([place]) 11/25/2015  . Hyperglycemia 11/25/2015  . Normocytic anemia 11/25/2015  . Acute respiratory failure with hypoxia ([place])   . Cardiogenic shock ([place])   . NSTEMI (non-ST elevated myocardial infarction) ([place])   . Cardiac arrest ([place]) 11/19/2015  . Angina pectoris ([place]) 08/28/2015  . Retinal detachment 05/07/2014  . OA (osteoarthritis) of knee 01/08/2014  . Preoperative respiratory examination 11/22/2013  . Pulmonary embolism ([place]) 04/18/2013  . Chronic back pain 04/18/2013  . Dyspnea on exertion 04/06/2013  . 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 Hypothyroidism   . Hypercholesterolemia     Mireya Meditz, PTA 05/27/2020, 3:27 PM  [facility] [address], [place], [place], [address] Phone: [phone]   Fax:  [phone]  Name: Brittany Mason MRN: [id number] Date of Birth: 09/06/1950

## 2020-06-11 ENCOUNTER — Other Ambulatory Visit: Payer: Self-pay | Admitting: Cardiovascular Disease

## 2020-06-12 ENCOUNTER — Ambulatory Visit: Payer: Medicare Other | Attending: Family Medicine | Admitting: Physical Therapy

## 2020-06-12 ENCOUNTER — Other Ambulatory Visit: Payer: Self-pay

## 2020-06-12 DIAGNOSIS — M25521 Pain in right elbow: Secondary | ICD-10-CM | POA: Diagnosis not present

## 2020-06-12 DIAGNOSIS — M6281 Muscle weakness (generalized): Secondary | ICD-10-CM | POA: Diagnosis present

## 2020-06-12 NOTE — Patient Instructions (Signed)

## 2020-06-12 NOTE — Therapy (Signed)
[facility] [address] [place], [place], [address] Phone: [phone]   Fax:  [phone]  Physical Therapy Treatment  Patient Details  Name: Brittany Mason MRN: [id number] Date of Birth: January 24, 1951 Referring Provider (PT): Dr. Catha Gosselin   Encounter Date: 06/12/2020   PT End of Session - 06/12/20 1232    Visit Number 3    Date for PT Re-Evaluation 07/18/20    Authorization Type Medicare BCBS    PT Start Time 1232    PT Stop Time 1310    PT Time Calculation (min) 38 min    Activity Tolerance Patient tolerated treatment well    Behavior During Therapy [facility] for tasks assessed/performed           Past Medical History:  Diagnosis Date  . Acute pulmonary embolism ([facility]) 06/20/2015  . Arthritis    osteoarthritis-knees. Chronic back pain  . Basal cell carcinoma of right shoulder    "burned off"  . CAD in native artery    a. V fib arrest 2017 s/p DES to LCx with occluded dCx. b. repeat cath 02/2018 treated medically (see report) - c/b RPH.  . Chronic combined systolic and diastolic HF (heart failure) ([facility]) 02/13/2018  . Chronic SI joint pain   . DVT (deep venous thrombosis) ([facility]) 04/2013   left lower leg, resulted in Pulmonary emboli on hormone therapy  . Dyspnea on exertion   . GERD (gastroesophageal reflux disease)   . Hypercholesterolemia   . Hypothyroidism   . Intramuscular hematoma 02/13/2018  . Migraine    "< once/month" (12/04/2015)  . MVP (mitral valve prolapse)    asymptomatic  . Myocardial infarction ([facility]) 11/19/2015   cardiac arrest  . PE (pulmonary embolism) Apr 18, 2013   tx. -using Xarelto now, no further lung problems, denies SOB on 01-02-14  . PONV (postoperative nausea and vomiting)   . Retroperitoneal hematoma   . RLS (restless legs syndrome)     Past Surgical History:  Procedure Laterality Date  . ANTERIOR CRUCIATE LIGAMENT REPAIR Left 1976; 1981   "open"  . CARDIAC CATHETERIZATION N/A  11/20/2015   Procedure: Left Heart Cath and Coronary Angiography;  Surgeon: Lyn Records, MD;  Location: [facility] IN[facility];  Service: Cardiovascular;  Laterality: N/A;  . CARDIAC CATHETERIZATION N/A 12/04/2015   Procedure: Left Heart Cath and Coronary Angiography;  Surgeon: Peter M Swaziland, MD;  [facility] IN[facility];  Service: Cardiovascular;  Laterality: N/A;  . CARDIAC CATHETERIZATION  12/04/2015   Procedure: Coronary Stent Intervention;  Surgeon: Peter M Swaziland, MD;  Location: [facility] INVASIVE [facility] LAB;  Service: Cardiovascular;;  . COLONOSCOPY  09/28/2011   Procedure: COLONOSCOPY;  Surgeon: Charna Elizabeth, MD;  [facility];  Service: Endoscopy;  Laterality: N/A;  . COLONOSCOPY WITH PROPOFOL N/A 01/05/2017   Procedure: COLONOSCOPY WITH PROPOFOL;  Surgeon: Charna Elizabeth, MD;  [facility];  Service: Endoscopy;  Laterality: N/A;  . INTRAVASCULAR PRESSURE WIRE/FFR STUDY N/A 02/11/2018   Procedure: INTRAVASCULAR PRESSURE WIRE/FFR STUDY;  Surgeon: Tonny Bollman, MD;  Location: [facility] INVASIVE [facility];  Service: Cardiovascular;  Laterality: N/A;  . JOINT REPLACEMENT    . LEFT HEART CATH AND CORONARY ANGIOGRAPHY N/A 10/08/2016   Procedure: Left Heart Cath and Coronary Angiography;  Surgeon: Peter M Swaziland, MD;  [facility]: [facility] INVAS[facility];  Service: Cardiovascular;  Laterality: N/A;  . LEFT HEART CATH AND CORONARY ANGIOGRAPHY N/A 02/11/2018   Procedure: LEFT HEART CATH AND CORONARY ANGIOGRAPHY;  Surgeon: Sherren Mocha, MD;  Location: [place] [facility];  Service: Cardiovascular;  Laterality: N/A;  . TOTAL ABDOMINAL HYSTERECTOMY  1998  . TOTAL KNEE ARTHROPLASTY Left 01/08/2014   Procedure: LEFT TOTAL KNEE ARTHROPLASTY;  Surgeon: Gearlean Alf, MD;  [facility];  Service: Orthopedics;  Laterality: Left;    There were no vitals filed for this visit.   Subjective Assessment - 06/12/20 1234    Subjective My pain is worse.I have no idea why. Might be from wrapping presents.    Pertinent  History Heart attack 3 years ago;  keeps nitroglycerin at all times in left pocket    Currently in Pain? Yes    Pain Score 4     Pain Location Elbow    Pain Orientation Right    Pain Descriptors / Indicators Sore    Multiple Pain Sites No                             [facility] Adult PT Treatment/Exercise - 06/12/20 0001      Wrist Exercises   Wrist Flexion Strengthening;Right;20 reps    Theraband Level (Wrist Flexion) Level 1 (Yellow)    Wrist Extension Strengthening;Right;20 reps;Seated;Bar weights/barbell    Bar Weights/Barbell (Wrist Extension) 1 lb      Ultrasound   Ultrasound Location Rt elbow    Ultrasound Parameters 3MZ, 100% 1wtcm2 8 min    Ultrasound Goals Pain      Iontophoresis   Type of Iontophoresis Dexamethasone   #1 skin intact   Location Rt elbow    Dose 1 ml    Time 4 hour wear                  PT Education - 06/12/20 1258    Education Details HEP: yellow band resisted wrist flex & ext  pt given band    Person(s) Educated Patient    Methods Explanation;Demonstration;Verbal cues   Declined handout   Comprehension Returned demonstration;Verbalized understanding            PT Short Term Goals - 05/23/20 2013      PT SHORT TERM GOAL #1   Title indpendent with initial HEP    Time 4    Period Weeks    Status New    Target Date 06/20/20      PT SHORT TERM GOAL #2   Title The patient will report a 30% improvement in right lateral elbow pain with using the remote, lifting a plate    Time 4    Period Weeks    Status New      PT SHORT TERM GOAL #3   Title Right grip with bent elbow improved to 45# needed for opening jars    Time 4    Period Weeks             PT Long Term Goals - 05/23/20 2015      PT LONG TERM GOAL #1   Title independent with HEP    Time 8    Period Weeks    Status New    Target Date 07/18/20      PT LONG TERM GOAL #2   Title The patient will report a 60% reduction in right elbow pain with using  the remote, lifting a plate, and playing tennis and pickleball    Time 8    Period Weeks    Status New      PT  LONG TERM GOAL #3   Title Right grip strength improved to 50# needed for opening jars    Time 8    Period Weeks    Status New      PT LONG TERM GOAL #4   Title FOTO functional outcome score improved from 55% to 38% limitation    Time 8    Period Weeks    Status New      PT LONG TERM GOAL #5   Title .Marland KitchenMarland Kitchen                 Plan - 06/12/20 1232    Clinical Impression Statement Pt initially reports her pain has worsened and wasn't sure why. After probing a bit pt did recall flare up after wrapping presents at Christmas.Despite ice, wearing brace and doing other exercises she remains in a slight flare up of pain. REceived signed MD orders to proceed with ionto patch. Pt also tolerated resistive wrist felxion & extension without increased pain. Yellow band was given for pt to do at home.    Personal Factors and Comorbidities Comorbidity 1;Comorbidity 2;Comorbidity 3+    Comorbidities Cardiac history (keeps nitroglycerin in left pocket at all times); OA; history of neck and back pain    Examination-Activity Limitations Other;Lift    Examination-Participation Restrictions Meal Prep;Cleaning;Other    Stability/Clinical Decision Making Stable/Uncomplicated    Rehab Potential Good    PT Frequency 2x / week    PT Duration 8 weeks    PT Treatment/Interventions ADLs/Self Care Home Management;Iontophoresis 4mg /ml Dexamethasone;Electrical Stimulation;Cryotherapy;Moist Heat;Ultrasound;Neuromuscular re-education;Therapeutic exercise;Therapeutic activities;Patient/family education;Manual techniques;Dry needling;Joint Manipulations;Taping    PT Next Visit Plan Ionto #2, [place] if pt feels helpful. Continue with strength: review how band is going at home. Consider cross friction massage.    PT Home Exercise Plan [id number]    Consulted and Agree with Plan of Care Patient           Patient  will benefit from skilled therapeutic intervention in order to improve the following deficits and impairments:  Pain,Impaired UE functional use,Decreased strength  Visit Diagnosis: Pain in right elbow  Muscle weakness (generalized)     Problem List Patient Active Problem List   Diagnosis Date Noted  . Acute blood loss anemia transfused 2 U PRBCs 02/11/18 02/13/2018  . Chronic combined systolic and diastolic HF (heart failure) ([place]) 02/13/2018  . Intramuscular hematoma 02/13/2018  . Retroperitoneal hemorrhage complicating cardiac catheterization 02/11/2018  . CAD in native artery 12/04/2015  . Cardiomyopathy, ischemic 12/04/2015  . CAD (coronary artery disease), native coronary artery 12/04/2015  . History of pulmonary embolism 11/25/2015  . Lactic acidosis 11/25/2015  . AKI (acute kidney injury) ([place]) 11/25/2015  . Aspiration pneumonia ([place]) 11/25/2015  . Hyperglycemia 11/25/2015  . Normocytic anemia 11/25/2015  . Acute respiratory failure with hypoxia ([place])   . Cardiogenic shock ([place])   . NSTEMI (non-ST elevated myocardial infarction) ([place])   . Cardiac arrest ([place]) 11/19/2015  . Angina pectoris ([place]) 08/28/2015  . Retinal detachment 05/07/2014  . OA (osteoarthritis) of knee 01/08/2014  . Preoperative respiratory examination 11/22/2013  . Pulmonary embolism ([place]) 04/18/2013  . Chronic back pain 04/18/2013  . Dyspnea on exertion 04/06/2013  . 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 Hypothyroidism   . Hypercholesterolemia     Ronin Rehfeldt, PTA 06/12/2020, 1:16 PM  [facility] [address], [place], [place], [address] Phone: [phone]   Fax:  [phone]  Name: Brittany Mason MRN: [id number] Date of Birth: September 09, 1950

## 2020-06-19 ENCOUNTER — Other Ambulatory Visit: Payer: Self-pay

## 2020-06-19 ENCOUNTER — Ambulatory Visit: Payer: Medicare Other | Admitting: Physical Therapy

## 2020-06-19 ENCOUNTER — Encounter: Payer: Self-pay | Admitting: Physical Therapy

## 2020-06-19 DIAGNOSIS — M6281 Muscle weakness (generalized): Secondary | ICD-10-CM

## 2020-06-19 DIAGNOSIS — M25521 Pain in right elbow: Secondary | ICD-10-CM | POA: Diagnosis not present

## 2020-06-19 NOTE — Therapy (Signed)
[facility] [address] [place], [place], [address] Phone: [phone]   Fax:  [phone]  Physical Therapy Treatment  Patient Details  Name: Brittany Mason MRN: [id number] Date of Birth: July 17, 1950 Referring Provider (PT): Dr. Hulan Fess   Encounter Date: 06/19/2020   PT End of Session - 06/19/20 1219    Visit Number 4    Date for PT Re-Evaluation 07/18/20    Authorization Type Medicare BCBS    PT Start Time 1144    PT Stop Time 1220    PT Time Calculation (min) 36 min    Activity Tolerance Patient tolerated treatment well    Behavior During Therapy [facility] for tasks assessed/performed           Past Medical History:  Diagnosis Date  . Acute pulmonary embolism ([place]) 06/20/2015  . Arthritis    osteoarthritis-knees. Chronic back pain  . Basal cell carcinoma of right shoulder    "burned off"  . CAD in native artery    a. V fib arrest 2017 s/p DES to LCx with occluded dCx. b. repeat cath 02/2018 treated medically (see report) - c/b RPH.  . Chronic combined systolic and diastolic HF (heart failure) ([place]) 02/13/2018  . Chronic SI joint pain   . DVT (deep venous thrombosis) ([place]) 04/2013   left lower leg, resulted in Pulmonary emboli on hormone therapy  . Dyspnea on exertion   . GERD (gastroesophageal reflux disease)   . Hypercholesterolemia   . Hypothyroidism   . Intramuscular hematoma 02/13/2018  . Migraine    "< once/month" (12/04/2015)  . MVP (mitral valve prolapse)    asymptomatic  . Myocardial infarction ([place]) 11/19/2015   cardiac arrest  . PE (pulmonary embolism) Apr 18, 2013   tx. -using Xarelto now, no further lung problems, denies SOB on 01-02-14  . PONV (postoperative nausea and vomiting)   . Retroperitoneal hematoma   . RLS (restless legs syndrome)     Past Surgical History:  Procedure Laterality Date  . ANTERIOR CRUCIATE LIGAMENT REPAIR Left 1976; 1981   "open"  . CARDIAC CATHETERIZATION N/A  11/20/2015   Procedure: Left Heart Cath and Coronary Angiography;  Surgeon: Belva Crome, MD;  Location: [place] [facility];  Service: Cardiovascular;  Laterality: N/A;  . CARDIAC CATHETERIZATION N/A 12/04/2015   Procedure: Left Heart Cath and Coronary Angiography;  Surgeon: Peter M Martinique, MD;  Location: [place] [facility];  Service: Cardiovascular;  Laterality: N/A;  . CARDIAC CATHETERIZATION  12/04/2015   Procedure: Coronary Stent Intervention;  Surgeon: Peter M Martinique, MD;  Location: [place] [facility];  Service: Cardiovascular;;  . COLONOSCOPY  09/28/2011   Procedure: COLONOSCOPY;  Surgeon: Juanita Craver, MD;  [facility];  Service: Endoscopy;  Laterality: N/A;  . COLONOSCOPY WITH PROPOFOL N/A 01/05/2017   Procedure: COLONOSCOPY WITH PROPOFOL;  Surgeon: Juanita Craver, MD;  [facility];  Service: Endoscopy;  Laterality: N/A;  . INTRAVASCULAR PRESSURE WIRE/FFR STUDY N/A 02/11/2018   Procedure: INTRAVASCULAR PRESSURE WIRE/FFR STUDY;  Surgeon: Sherren Mocha, MD;  Location: [place] [facility];  Service: Cardiovascular;  Laterality: N/A;  . JOINT REPLACEMENT    . LEFT HEART CATH AND CORONARY ANGIOGRAPHY N/A 10/08/2016   Procedure: Left Heart Cath and Coronary Angiography;  Surgeon: Peter M Martinique, MD;  Location: [place] [facility];  Service: Cardiovascular;  Laterality: N/A;  . LEFT HEART CATH AND CORONARY ANGIOGRAPHY N/A 02/11/2018   Procedure: LEFT HEART CATH AND CORONARY ANGIOGRAPHY;  Surgeon: Sherren Mocha, MD;  Location: [place] [facility];  Service: Cardiovascular;  Laterality: N/A;  . TOTAL ABDOMINAL HYSTERECTOMY  1998  . TOTAL KNEE ARTHROPLASTY Left 01/08/2014   Procedure: LEFT TOTAL KNEE ARTHROPLASTY;  Surgeon: Gearlean Alf, MD;  [facility]: [facility];  Service: Orthopedics;  Laterality: Left;    There were no vitals filed for this visit.   Subjective Assessment - 06/19/20 1202    Subjective Pain coming down. Doing yellow band at home with no issues.    Pertinent History  Heart attack 3 years ago;  keeps nitroglycerin at all times in left pocket    Currently in Pain? Yes    Pain Score 2     Pain Location Elbow    Pain Orientation Right    Pain Descriptors / Indicators Dull    Pain Relieving Factors Making adjustments to how I lift items, [place] helps, maybe patch but only did 1x so far    Multiple Pain Sites No                             [facility] Adult PT Treatment/Exercise - 06/19/20 0001      Wrist Exercises   Wrist Flexion Strengthening;Right;20 reps    Theraband Level (Wrist Flexion) Level 2 (Red)   Updated band to red for home progression   Wrist Extension Strengthening;Right;20 reps;Seated;Bar weights/barbell    Theraband Level (Wrist Extension) Level 2 (Red)      Ultrasound   Ultrasound Location RT elbow    Ultrasound Goals Pain      Iontophoresis   Type of Iontophoresis Dexamethasone   #2 skin intact   Location Rt elbow    Dose 1 ml    Time 4 hour wear                    PT Short Term Goals - 06/19/20 1221      PT SHORT TERM GOAL #1   Title indpendent with initial HEP    Period Weeks    Status Achieved    Target Date 06/20/20             PT Long Term Goals - 05/23/20 2015      PT LONG TERM GOAL #1   Title independent with HEP    Time 8    Period Weeks    Status New    Target Date 07/18/20      PT LONG TERM GOAL #2   Title The patient will report a 60% reduction in right elbow pain with using the remote, lifting a plate, and playing tennis and pickleball    Time 8    Period Weeks    Status New      PT LONG TERM GOAL #3   Title Right grip strength improved to 50# needed for opening jars    Time 8    Period Weeks    Status New      PT LONG TERM GOAL #4   Title FOTO functional outcome score improved from 55% to 38% limitation    Time 8    Period Weeks    Status New      PT LONG TERM GOAL #5   Title .Marland KitchenMarland Kitchen                 Plan - 06/19/20 1215    Clinical Impression Statement  Patient arrives reporting pain is down, rates it about a  2/10 and is pleased to see some change in her apin. Pt tolerated the ionto patch but could not tell if the first patch was responsible for the pain reduciton, or maybe the ultrasound or "maybe all of it?" Pt is compliant with her tband home exercises and is not having any pain exacerbations. today she was progressed to red band. We continued with [place] and ionto patch #2 to continue with soft tissue healing/pain reduction per pt's verbal reports.    Personal Factors and Comorbidities Comorbidity 1;Comorbidity 2;Comorbidity 3+    Comorbidities Cardiac history (keeps nitroglycerin in left pocket at all times); OA; history of neck and back pain    Examination-Activity Limitations Other;Lift    Examination-Participation Restrictions Meal Prep;Cleaning;Other    Stability/Clinical Decision Making Stable/Uncomplicated    Rehab Potential Good    PT Frequency 2x / week    PT Duration 8 weeks    PT Treatment/Interventions ADLs/Self Care Home Management;Iontophoresis 4mg /ml Dexamethasone;Electrical Stimulation;Cryotherapy;Moist Heat;Ultrasound;Neuromuscular re-education;Therapeutic exercise;Therapeutic activities;Patient/family education;Manual techniques;Dry needling;Joint Manipulations;Taping    PT Next Visit Plan Ionto #3, [place], consider progression to green band . Check STGs, grip, consider [place] [id number]    Consulted and Agree with Plan of Care Patient           Patient will benefit from skilled therapeutic intervention in order to improve the following deficits and impairments:  Pain,Impaired UE functional use,Decreased strength  Visit Diagnosis: Pain in right elbow  Muscle weakness (generalized)     Problem List Patient Active Problem List   Diagnosis Date Noted  . Acute blood loss anemia transfused 2 U PRBCs 02/11/18 02/13/2018  . Chronic combined systolic and diastolic HF (heart failure) ([place]) 02/13/2018  .  Intramuscular hematoma 02/13/2018  . Retroperitoneal hemorrhage complicating cardiac catheterization 02/11/2018  . CAD in native artery 12/04/2015  . Cardiomyopathy, ischemic 12/04/2015  . CAD (coronary artery disease), native coronary artery 12/04/2015  . History of pulmonary embolism 11/25/2015  . Lactic acidosis 11/25/2015  . AKI (acute kidney injury) ([place]) 11/25/2015  . Aspiration pneumonia ([place]) 11/25/2015  . Hyperglycemia 11/25/2015  . Normocytic anemia 11/25/2015  . Acute respiratory failure with hypoxia ([place])   . Cardiogenic shock ([place])   . NSTEMI (non-ST elevated myocardial infarction) ([place])   . Cardiac arrest ([place]) 11/19/2015  . Angina pectoris ([place]) 08/28/2015  . Retinal detachment 05/07/2014  . OA (osteoarthritis) of knee 01/08/2014  . Preoperative respiratory examination 11/22/2013  . Pulmonary embolism ([place]) 04/18/2013  . Chronic back pain 04/18/2013  . Dyspnea on exertion 04/06/2013  . 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 Hypothyroidism   . Hypercholesterolemia     Elese Rane, PTA 06/19/2020, 12:23 PM  [facility] [address], [place], [place], [address] Phone: [phone]   Fax:  [phone]  Name: Brittany Mason MRN: [id number] Date of Birth: [birth year]

## 2020-06-21 ENCOUNTER — Other Ambulatory Visit: Payer: Self-pay

## 2020-06-21 ENCOUNTER — Ambulatory Visit: Payer: Medicare Other | Admitting: Physical Therapy

## 2020-06-21 DIAGNOSIS — M25521 Pain in right elbow: Secondary | ICD-10-CM

## 2020-06-21 DIAGNOSIS — M6281 Muscle weakness (generalized): Secondary | ICD-10-CM

## 2020-06-21 NOTE — Therapy (Signed)
[facility] [address], [place], [place], [address] Phone: [phone]   Fax:  [phone]  Physical Therapy Treatment  Patient Details  Name: Brittany Mason MRN: [id number] Date of Birth: 10/22/1950 Referring Provider (PT): Dr. Hulan Fess   Encounter Date: 06/21/2020   PT End of Session - 06/21/20 1152    Visit Number 5    Date for PT Re-Evaluation 07/18/20    Authorization Type Medicare BCBS    PT Start Time 1100    PT Stop Time 1145    PT Time Calculation (min) 45 min    Activity Tolerance Patient tolerated treatment well           Past Medical History:  Diagnosis Date  . Acute pulmonary embolism ([place]) 06/20/2015  . Arthritis    osteoarthritis-knees. Chronic back pain  . Basal cell carcinoma of right shoulder    "burned off"  . CAD in native artery    a. V fib arrest 2017 s/p DES to LCx with occluded dCx. b. repeat cath 02/2018 treated medically (see report) - c/b RPH.  . Chronic combined systolic and diastolic HF (heart failure) ([place]) 02/13/2018  . Chronic SI joint pain   . DVT (deep venous thrombosis) ([place]) 04/2013   left lower leg, resulted in Pulmonary emboli on hormone therapy  . Dyspnea on exertion   . GERD (gastroesophageal reflux disease)   . Hypercholesterolemia   . Hypothyroidism   . Intramuscular hematoma 02/13/2018  . Migraine    "< once/month" (12/04/2015)  . MVP (mitral valve prolapse)    asymptomatic  . Myocardial infarction ([place]) 11/19/2015   cardiac arrest  . PE (pulmonary embolism) Apr 18, 2013   tx. -using Xarelto now, no further lung problems, denies SOB on 01-02-14  . PONV (postoperative nausea and vomiting)   . Retroperitoneal hematoma   . RLS (restless legs syndrome)     Past Surgical History:  Procedure Laterality Date  . ANTERIOR CRUCIATE LIGAMENT REPAIR Left 1976; 1981   "open"  . CARDIAC CATHETERIZATION N/A 11/20/2015   Procedure: Left Heart Cath and Coronary  Angiography;  Surgeon: Belva Crome, MD;  Location: [place] [facility];  Service: Cardiovascular;  Laterality: N/A;  . CARDIAC CATHETERIZATION N/A 12/04/2015   Procedure: Left Heart Cath and Coronary Angiography;  Surgeon: Peter M Martinique, MD;  Location: [place] [facility];  Service: Cardiovascular;  Laterality: N/A;  . CARDIAC CATHETERIZATION  12/04/2015   Procedure: Coronary Stent Intervention;  Surgeon: Peter M Martinique, MD;  Location: [place] [facility];  Service: Cardiovascular;;  . COLONOSCOPY  09/28/2011   Procedure: COLONOSCOPY;  Surgeon: Juanita Craver, MD;  [facility];  Service: Endoscopy;  Laterality: N/A;  . COLONOSCOPY WITH PROPOFOL N/A 01/05/2017   Procedure: COLONOSCOPY WITH PROPOFOL;  Surgeon: Juanita Craver, MD;  [facility];  Service: Endoscopy;  Laterality: N/A;  . INTRAVASCULAR PRESSURE WIRE/FFR STUDY N/A 02/11/2018   Procedure: INTRAVASCULAR PRESSURE WIRE/FFR STUDY;  Surgeon: Sherren Mocha, MD;  Location: [place] [facility];  Service: Cardiovascular;  Laterality: N/A;  . JOINT REPLACEMENT    . LEFT HEART CATH AND CORONARY ANGIOGRAPHY N/A 10/08/2016   Procedure: Left Heart Cath and Coronary Angiography;  Surgeon: Peter M Martinique, MD;  Location: [place] [facility];  Service: Cardiovascular;  Laterality: N/A;  . LEFT HEART CATH AND CORONARY ANGIOGRAPHY N/A 02/11/2018   Procedure: LEFT HEART CATH AND CORONARY ANGIOGRAPHY;  Surgeon: Sherren Mocha, MD;  Location: [place] [facility];  Service: Cardiovascular;  Laterality: N/A;  . TOTAL ABDOMINAL HYSTERECTOMY  1998  . TOTAL KNEE ARTHROPLASTY Left 01/08/2014   Procedure: LEFT TOTAL KNEE ARTHROPLASTY;  Surgeon: Gearlean Alf, MD;  [facility];  Service: Orthopedics;  Laterality: Left;    There were no vitals filed for this visit.   Subjective Assessment - 06/21/20 1102    Subjective A little better.  I still feel pain lifting a loaf of bread.  I wear my brace for ADLs but not at rest.  The remote still hurts     Pertinent History Heart attack 3 years ago;  keeps nitroglycerin at all times in left pocket    Currently in Pain? Yes    Pain Score 2     Pain Location Elbow    Pain Orientation Right                             [facility] Adult PT Treatment/Exercise - 06/21/20 0001      Elbow Exercises   Elbow Flexion Strengthening;Right;20 reps    Theraband Level (Elbow Flexion) Level 2 (Red)    Elbow Extension Strengthening;Right;20 reps    Theraband Level (Elbow Extension) Level 2 (Red)    Elbow Extension Limitations anchored top of door    Forearm Supination Right;10 reps    Theraband Level (Supination) Level 2 (Red)    Forearm Pronation Strengthening;Right;10 reps    Theraband Level (Pronation) Level 2 (Red)      Ultrasound   Ultrasound Location Right elbow    Ultrasound Parameters 3 MHZ 100% 1.0 W/cm2    Ultrasound Goals Pain      Iontophoresis   Type of Iontophoresis Dexamethasone   #1 skin intact   Location Rt elbow    Dose 1 ml    Time 4 hour wear      Manual Therapy   Soft tissue mobilization Rt wrist extensor mass with and without instrument assist                    PT Short Term Goals - 06/21/20 1159      PT SHORT TERM GOAL #1   Title indpendent with initial HEP    Status Achieved      PT SHORT TERM GOAL #2   Title The patient will report a 30% improvement in right lateral elbow pain with using the remote, lifting a plate    Time 4    Period Weeks    Status On-going      PT SHORT TERM GOAL #3   Title Right grip with bent elbow improved to 45# needed for opening jars    Period Weeks    Status On-going             PT Long Term Goals - 05/23/20 2015      PT LONG TERM GOAL #1   Title independent with HEP    Time 8    Period Weeks    Status New    Target Date 07/18/20      PT LONG TERM GOAL #2   Title The patient will report a 60% reduction in right elbow pain with using the remote, lifting a plate, and playing tennis and pickleball     Time 8    Period Weeks    Status New      PT LONG TERM GOAL #3   Title Right grip strength improved to 50# needed for opening  jars    Time 8    Period Weeks    Status New      PT LONG TERM GOAL #4   Title FOTO functional outcome score improved from 55% to 38% limitation    Time 8    Period Weeks    Status New      PT LONG TERM GOAL #5   Title .Marland KitchenMarland Kitchen                 Plan - 06/21/20 1153    Clinical Impression Statement The patient reports slow, steady improvement (sleeping better) but still with pain with fine motor activities like using the TV remote.  Improved soft tissue mobility of wrist extensors.  Added pronation/supination and biceps/triceps strengthening.  Encouraged open palm pull and push to decrease elbow pain.  Therapist monitoring response with all interventions.    Comorbidities Cardiac history (keeps nitroglycerin in left pocket at all times); OA; history of neck and back pain    Examination-Participation Restrictions Meal Prep;Cleaning;Other    Stability/Clinical Decision Making Stable/Uncomplicated    Rehab Potential Good    PT Frequency 2x / week    PT Duration 8 weeks    PT Treatment/Interventions ADLs/Self Care Home Management;Iontophoresis 4mg /ml Dexamethasone;Electrical Stimulation;Cryotherapy;Moist Heat;Ultrasound;Neuromuscular re-education;Therapeutic exercise;Therapeutic activities;Patient/family education;Manual techniques;Dry needling;Joint Manipulations;Taping    PT Next Visit Plan Ionto #4, [place], consider progression to green band . Recheck grip for STGs and %    PT Home Exercise Plan [id number]           Patient will benefit from skilled therapeutic intervention in order to improve the following deficits and impairments:  Pain,Impaired UE functional use,Decreased strength  Visit Diagnosis: Pain in right elbow  Muscle weakness (generalized)     Problem List Patient Active Problem List   Diagnosis Date Noted  . Acute blood loss anemia  transfused 2 U PRBCs 02/11/18 02/13/2018  . Chronic combined systolic and diastolic HF (heart failure) ([place]) 02/13/2018  . Intramuscular hematoma 02/13/2018  . Retroperitoneal hemorrhage complicating cardiac catheterization 02/11/2018  . CAD in native artery 12/04/2015  . Cardiomyopathy, ischemic 12/04/2015  . CAD (coronary artery disease), native coronary artery 12/04/2015  . History of pulmonary embolism 11/25/2015  . Lactic acidosis 11/25/2015  . AKI (acute kidney injury) ([place]) 11/25/2015  . Aspiration pneumonia ([place]) 11/25/2015  . Hyperglycemia 11/25/2015  . Normocytic anemia 11/25/2015  . Acute respiratory failure with hypoxia ([place])   . Cardiogenic shock ([place])   . NSTEMI (non-ST elevated myocardial infarction) ([place])   . Cardiac arrest ([place]) 11/19/2015  . Angina pectoris ([place]) 08/28/2015  . Retinal detachment 05/07/2014  . OA (osteoarthritis) of knee 01/08/2014  . Preoperative respiratory examination 11/22/2013  . Pulmonary embolism ([place]) 04/18/2013  . Chronic back pain 04/18/2013  . Dyspnea on exertion 04/06/2013  . 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 Hypothyroidism   . Hypercholesterolemia    Ruben Im, PT 06/21/20 12:02 PM Phone: [phone] Fax: [phone] Alvera Singh 06/21/2020, 12:02 PM  [facility] [address], [place], [place], [address] Phone: [phone]   Fax:  [phone]  Name: [place] COHICK MRN: [id number] Date of Birth: Jun 06, 1951

## 2020-06-26 ENCOUNTER — Ambulatory Visit: Payer: Medicare Other | Admitting: Physical Therapy

## 2020-06-26 ENCOUNTER — Encounter: Payer: Self-pay | Admitting: Physical Therapy

## 2020-06-26 ENCOUNTER — Other Ambulatory Visit: Payer: Self-pay

## 2020-06-26 DIAGNOSIS — M25521 Pain in right elbow: Secondary | ICD-10-CM | POA: Diagnosis not present

## 2020-06-26 DIAGNOSIS — M6281 Muscle weakness (generalized): Secondary | ICD-10-CM

## 2020-06-26 NOTE — Therapy (Signed)
[facility] [address] [place], [place], [address] Phone: [phone]   Fax:  [phone]  Physical Therapy Treatment  Patient Details  Name: Brittany Mason MRN: [id number] Date of Birth: [birth year] Referring Provider (PT): Dr. Hulan Fess   Encounter Date: 06/26/2020   PT End of Session - 06/26/20 1055    Visit Number 6    Date for PT Re-Evaluation 07/18/20    Authorization Type Medicare BCBS    PT Start Time 1055    PT Stop Time 1130    PT Time Calculation (min) 35 min    Activity Tolerance Patient tolerated treatment well    Behavior During Therapy [facility] for tasks assessed/performed           Past Medical History:  Diagnosis Date  . Acute pulmonary embolism ([place]) 06/20/2015  . Arthritis    osteoarthritis-knees. Chronic back pain  . Basal cell carcinoma of right shoulder    "burned off"  . CAD in native artery    a. V fib arrest 2017 s/p DES to LCx with occluded dCx. b. repeat cath 02/2018 treated medically (see report) - c/b RPH.  . Chronic combined systolic and diastolic HF (heart failure) ([place]) 02/13/2018  . Chronic SI joint pain   . DVT (deep venous thrombosis) ([place]) 04/2013   left lower leg, resulted in Pulmonary emboli on hormone therapy  . Dyspnea on exertion   . GERD (gastroesophageal reflux disease)   . Hypercholesterolemia   . Hypothyroidism   . Intramuscular hematoma 02/13/2018  . Migraine    "< once/month" (12/04/2015)  . MVP (mitral valve prolapse)    asymptomatic  . Myocardial infarction ([place]) 11/19/2015   cardiac arrest  . PE (pulmonary embolism) Apr 18, 2013   tx. -using Xarelto now, no further lung problems, denies SOB on 01-02-14  . PONV (postoperative nausea and vomiting)   . Retroperitoneal hematoma   . RLS (restless legs syndrome)     Past Surgical History:  Procedure Laterality Date  . ANTERIOR CRUCIATE LIGAMENT REPAIR Left 1976; 1981   "open"  . CARDIAC CATHETERIZATION N/A  11/20/2015   Procedure: Left Heart Cath and Coronary Angiography;  Surgeon: Belva Crome, MD;  Location: [place] [facility];  Service: Cardiovascular;  Laterality: N/A;  . CARDIAC CATHETERIZATION N/A 12/04/2015   Procedure: Left Heart Cath and Coronary Angiography;  Surgeon: Peter M Martinique, MD;  Location: [place] [facility];  Service: Cardiovascular;  Laterality: N/A;  . CARDIAC CATHETERIZATION  12/04/2015   Procedure: Coronary Stent Intervention;  Surgeon: Peter M Martinique, MD;  Location: [place] [facility];  Service: Cardiovascular;;  . COLONOSCOPY  09/28/2011   Procedure: COLONOSCOPY;  Surgeon: Juanita Craver, MD;  [facility];  Service: Endoscopy;  Laterality: N/A;  . COLONOSCOPY WITH PROPOFOL N/A 01/05/2017   Procedure: COLONOSCOPY WITH PROPOFOL;  Surgeon: Juanita Craver, MD;  [facility];  Service: Endoscopy;  Laterality: N/A;  . INTRAVASCULAR PRESSURE WIRE/FFR STUDY N/A 02/11/2018   Procedure: INTRAVASCULAR PRESSURE WIRE/FFR STUDY;  Surgeon: Sherren Mocha, MD;  Location: [place] [facility];  Service: Cardiovascular;  Laterality: N/A;  . JOINT REPLACEMENT    . LEFT HEART CATH AND CORONARY ANGIOGRAPHY N/A 10/08/2016   Procedure: Left Heart Cath and Coronary Angiography;  Surgeon: Peter M Martinique, MD;  Location: [place] [facility];  Service: Cardiovascular;  Laterality: N/A;  . LEFT HEART CATH AND CORONARY ANGIOGRAPHY N/A 02/11/2018   Procedure: LEFT HEART CATH AND CORONARY ANGIOGRAPHY;  Surgeon: Sherren Mocha, MD;  Location: [place] [facility];  Service: Cardiovascular;  Laterality: N/A;  . TOTAL ABDOMINAL HYSTERECTOMY  1998  . TOTAL KNEE ARTHROPLASTY Left 01/08/2014   Procedure: LEFT TOTAL KNEE ARTHROPLASTY;  Surgeon: Gearlean Alf, MD;  [facility];  Service: Orthopedics;  Laterality: Left;    There were no vitals filed for this visit.   Subjective Assessment - 06/26/20 1056    Subjective Better, patch was good. I noticed i did "good" yesterday.    Pertinent History  Heart attack 3 years ago;  keeps nitroglycerin at all times in left pocket    Currently in Pain? Yes    Pain Score 1     Pain Location Elbow    Pain Orientation Right    Pain Descriptors / Indicators Dull                             [facility] Adult PT Treatment/Exercise - 06/26/20 0001      Elbow Exercises   Elbow Flexion Strengthening;Right;10 reps    Theraband Level (Elbow Flexion) Level 3 (Green)    Elbow Extension Strengthening;Right;20 reps;Seated;Theraband    Theraband Level (Elbow Extension) Level 3 (Green)      Wrist Exercises   Wrist Flexion Strengthening;Right;Seated;Theraband;10 reps    Theraband Level (Wrist Flexion) Level 3 (Green)      Ultrasound   Ultrasound Location Right elbow    Ultrasound Parameters 3MZ, 100% 1wtcm2 x 8 min    Ultrasound Goals Pain      Iontophoresis   Type of Iontophoresis Dexamethasone   #4 skin intact   Location Rt elbow    Dose 1 ml    Time 4 hour wear                    PT Short Term Goals - 06/21/20 1159      PT SHORT TERM GOAL #1   Title indpendent with initial HEP    Status Achieved      PT SHORT TERM GOAL #2   Title The patient will report a 30% improvement in right lateral elbow pain with using the remote, lifting a plate    Time 4    Period Weeks    Status On-going      PT SHORT TERM GOAL #3   Title Right grip with bent elbow improved to 45# needed for opening jars    Period Weeks    Status On-going             PT Long Term Goals - 05/23/20 2015      PT LONG TERM GOAL #1   Title independent with HEP    Time 8    Period Weeks    Status New    Target Date 07/18/20      PT LONG TERM GOAL #2   Title The patient will report a 60% reduction in right elbow pain with using the remote, lifting a plate, and playing tennis and pickleball    Time 8    Period Weeks    Status New      PT LONG TERM GOAL #3   Title Right grip strength improved to 50# needed for opening jars    Time 8     Period Weeks    Status New      PT LONG TERM GOAL #4   Title FOTO functional outcome score improved from 55% to 38% limitation  Time 8    Period Weeks    Status New      PT LONG TERM GOAL #5   Title .Marland KitchenMarland Kitchen                 Plan - 06/26/20 1132    Clinical Impression Statement Pt reports last patch felt more effective and "had a good day yesterday." As of today pt reports 25% improvement in regards to less pain. Pt was able to increase her theraband resistance to green for all her UE exercises without increasing any pain ( pain today was down to a 1/10)and then given for HEP progression. Visually had muscular shaking with wrist extension & flexion.    Personal Factors and Comorbidities Comorbidity 1;Comorbidity 2;Comorbidity 3+    Comorbidities Cardiac history (keeps nitroglycerin in left pocket at all times); OA; history of neck and back pain    Examination-Participation Restrictions Meal Prep;Cleaning;Other    Stability/Clinical Decision Making Stable/Uncomplicated    Rehab Potential Good    PT Frequency 2x / week    PT Duration 8 weeks    PT Treatment/Interventions ADLs/Self Care Home Management;Iontophoresis 4mg /ml Dexamethasone;Electrical Stimulation;Cryotherapy;Moist Heat;Ultrasound;Neuromuscular re-education;Therapeutic exercise;Therapeutic activities;Patient/family education;Manual techniques;Dry needling;Joint Manipulations;Taping    PT Next Visit Plan 2 more ionto patches, recheck grip for STGs, make sure pt is doing ok with the green band. Pt is interested in extending her PT through february but is concerned about getting appointments is she waits to her re-eval, she is going to try and schedule through her cert date of [id number].    PT Home Exercise Plan [id number]    Consulted and Agree with Plan of Care Patient           Patient will benefit from skilled therapeutic intervention in order to improve the following deficits and impairments:  Pain,Impaired UE functional  use,Decreased strength  Visit Diagnosis: Pain in right elbow  Muscle weakness (generalized)     Problem List Patient Active Problem List   Diagnosis Date Noted  . Acute blood loss anemia transfused 2 U PRBCs 02/11/18 02/13/2018  . Chronic combined systolic and diastolic HF (heart failure) ([place]) 02/13/2018  . Intramuscular hematoma 02/13/2018  . Retroperitoneal hemorrhage complicating cardiac catheterization 02/11/2018  . CAD in native artery 12/04/2015  . Cardiomyopathy, ischemic 12/04/2015  . CAD (coronary artery disease), native coronary artery 12/04/2015  . History of pulmonary embolism 11/25/2015  . Lactic acidosis 11/25/2015  . AKI (acute kidney injury) ([place]) 11/25/2015  . Aspiration pneumonia ([place]) 11/25/2015  . Hyperglycemia 11/25/2015  . Normocytic anemia 11/25/2015  . Acute respiratory failure with hypoxia ([place])   . Cardiogenic shock ([place])   . NSTEMI (non-ST elevated myocardial infarction) ([place])   . Cardiac arrest ([place]) 11/19/2015  . Angina pectoris ([place]) 08/28/2015  . Retinal detachment 05/07/2014  . OA (osteoarthritis) of knee 01/08/2014  . Preoperative respiratory examination 11/22/2013  . Pulmonary embolism ([place]) 04/18/2013  . Chronic back pain 04/18/2013  . Dyspnea on exertion 04/06/2013  . 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 Hypothyroidism   . Hypercholesterolemia     COCHRAN,JENNIFER, PTA 06/26/2020, 11:41 AM  [facility] [address], [place], [place], [address] Phone: [phone]   Fax:  [phone]  Name: DAWNYA FLENNER MRN: [id number] Date of Birth: 1950-07-27

## 2020-06-28 ENCOUNTER — Other Ambulatory Visit: Payer: Self-pay

## 2020-06-28 ENCOUNTER — Ambulatory Visit: Payer: Medicare Other | Admitting: Physical Therapy

## 2020-06-28 DIAGNOSIS — M25521 Pain in right elbow: Secondary | ICD-10-CM

## 2020-06-28 DIAGNOSIS — M6281 Muscle weakness (generalized): Secondary | ICD-10-CM

## 2020-06-28 NOTE — Patient Instructions (Signed)
Access Code: [email or web address] URL: [email or web address] Date: 06/28/2020 Prepared by: Ruben Im  Exercises Standing Wrist Flexion Stretch - 1 x daily - 7 x weekly - 1 sets - 10 reps Seated Eccentric Wrist Extension - 1-2 x daily - 7 x weekly - 2 sets - 10 reps Wall Push Up with Plus - 1 x daily - 7 x weekly - 1 sets - 10 reps Standing Shoulder Horizontal Abduction with Resistance - 1 x daily - 7 x weekly - 1 sets - 10 reps Shoulder Flexion Wall Walk - 1 x daily - 7 x weekly - 1 sets - 10 reps

## 2020-06-28 NOTE — Therapy (Signed)
[facility] [address] [place], [place], [address] Phone: [phone]   Fax:  [phone]  Physical Therapy Treatment  Patient Details  Name: Brittany Mason MRN: [id number] Date of Birth: [birth year] Referring Provider (PT): Dr. Hulan Fess   Encounter Date: 06/28/2020   PT End of Session - 06/28/20 1209    Visit Number 7    Date for PT Re-Evaluation 07/18/20    Authorization Type Medicare BCBS    PT Start Time 1012    PT Stop Time 1053    PT Time Calculation (min) 41 min    Activity Tolerance Patient tolerated treatment well           Past Medical History:  Diagnosis Date  . Acute pulmonary embolism ([place]) 06/20/2015  . Arthritis    osteoarthritis-knees. Chronic back pain  . Basal cell carcinoma of right shoulder    "burned off"  . CAD in native artery    a. V fib arrest 2017 s/p DES to LCx with occluded dCx. b. repeat cath 02/2018 treated medically (see report) - c/b RPH.  . Chronic combined systolic and diastolic HF (heart failure) ([place]) 02/13/2018  . Chronic SI joint pain   . DVT (deep venous thrombosis) ([place]) 04/2013   left lower leg, resulted in Pulmonary emboli on hormone therapy  . Dyspnea on exertion   . GERD (gastroesophageal reflux disease)   . Hypercholesterolemia   . Hypothyroidism   . Intramuscular hematoma 02/13/2018  . Migraine    "< once/month" (12/04/2015)  . MVP (mitral valve prolapse)    asymptomatic  . Myocardial infarction ([place]) 11/19/2015   cardiac arrest  . PE (pulmonary embolism) Apr 18, 2013   tx. -using Xarelto now, no further lung problems, denies SOB on 01-02-14  . PONV (postoperative nausea and vomiting)   . Retroperitoneal hematoma   . RLS (restless legs syndrome)     Past Surgical History:  Procedure Laterality Date  . ANTERIOR CRUCIATE LIGAMENT REPAIR Left 1976; 1981   "open"  . CARDIAC CATHETERIZATION N/A 11/20/2015   Procedure: Left Heart Cath and Coronary  Angiography;  Surgeon: Belva Crome, MD;  Location: [place] [facility];  Service: Cardiovascular;  Laterality: N/A;  . CARDIAC CATHETERIZATION N/A 12/04/2015   Procedure: Left Heart Cath and Coronary Angiography;  Surgeon: Peter M Martinique, MD;  Location: [place] [facility];  Service: Cardiovascular;  Laterality: N/A;  . CARDIAC CATHETERIZATION  12/04/2015   Procedure: Coronary Stent Intervention;  Surgeon: Peter M Martinique, MD;  Location: [place] [facility];  Service: Cardiovascular;;  . COLONOSCOPY  09/28/2011   Procedure: COLONOSCOPY;  Surgeon: Juanita Craver, MD;  [facility];  Service: Endoscopy;  Laterality: N/A;  . COLONOSCOPY WITH PROPOFOL N/A 01/05/2017   Procedure: COLONOSCOPY WITH PROPOFOL;  Surgeon: Juanita Craver, MD;  [facility];  Service: Endoscopy;  Laterality: N/A;  . INTRAVASCULAR PRESSURE WIRE/FFR STUDY N/A 02/11/2018   Procedure: INTRAVASCULAR PRESSURE WIRE/FFR STUDY;  Surgeon: Sherren Mocha, MD;  Location: [place] [facility];  Service: Cardiovascular;  Laterality: N/A;  . JOINT REPLACEMENT    . LEFT HEART CATH AND CORONARY ANGIOGRAPHY N/A 10/08/2016   Procedure: Left Heart Cath and Coronary Angiography;  Surgeon: Peter M Martinique, MD;  Location: [place] [facility];  Service: Cardiovascular;  Laterality: N/A;  . LEFT HEART CATH AND CORONARY ANGIOGRAPHY N/A 02/11/2018   Procedure: LEFT HEART CATH AND CORONARY ANGIOGRAPHY;  Surgeon: Sherren Mocha, MD;  Location: [place] [facility];  Service: Cardiovascular;  Laterality: N/A;  . TOTAL ABDOMINAL HYSTERECTOMY  1998  . TOTAL KNEE ARTHROPLASTY Left 01/08/2014   Procedure: LEFT TOTAL KNEE ARTHROPLASTY;  Surgeon: Gearlean Alf, MD;  [facility];  Service: Orthopedics;  Laterality: Left;    There were no vitals filed for this visit.   Subjective Assessment - 06/28/20 1012    Subjective My elbow was hurting yesterday but better today. Tender today.    Pertinent History Heart attack 3 years ago;  keeps nitroglycerin at  all times in left pocket    Currently in Pain? Yes    Pain Score 2               [facility] PT Assessment - 06/28/20 0001      Strength   Right Hand Grip (lbs) 61# no pain                         [facility] Adult PT Treatment/Exercise - 06/28/20 0001      Elbow Exercises   Other elbow exercises instructed in self mob with movement on the corner with patient self applying lateral pressure while moving with thumb up/down 2x10 painfree      Shoulder Exercises: Standing   Other Standing Exercises shoulder horizontal abduction with red band wrapped around wrists 15x    Other Standing Exercises light green loop wall walk backward Cs 10x      Iontophoresis   Type of Iontophoresis Dexamethasone   #4 skin intact   Location Rt elbow    Dose 1 ml  #5    Time 4 hour wear      Manual Therapy   Manual therapy comments Mulligan mob with movement lateral glide on forearm while patient does thumb movement 2x10 painfree    Soft tissue mobilization Rt wrist extensor mass with  instrument assist                  PT Education - 06/28/20 1218    Education Details mob with movement self mob on wall/corner; red band horizontal abduction; wall scoops with band    Person(s) Educated Patient    Methods Explanation;Demonstration;Handout    Comprehension Returned demonstration;Verbalized understanding            PT Short Term Goals - 06/28/20 1227      PT SHORT TERM GOAL #1   Title indpendent with initial HEP    Status Achieved      PT SHORT TERM GOAL #2   Title The patient will report a 30% improvement in right lateral elbow pain with using the remote, lifting a plate    Status Achieved      PT SHORT TERM GOAL #3   Title Right grip with bent elbow improved to 45# needed for opening jars    Status Achieved             PT Long Term Goals - 05/23/20 2015      PT LONG TERM GOAL #1   Title independent with HEP    Time 8    Period Weeks    Status New    Target Date  07/18/20      PT LONG TERM GOAL #2   Title The patient will report a 60% reduction in right elbow pain with using the remote, lifting a plate, and playing tennis and pickleball    Time 8    Period Weeks    Status New  PT LONG TERM GOAL #3   Title Right grip strength improved to 50# needed for opening jars    Time 8    Period Weeks    Status New      PT LONG TERM GOAL #4   Title FOTO functional outcome score improved from 55% to 38% limitation    Time 8    Period Weeks    Status New      PT LONG TERM GOAL #5   Title .Marland KitchenMarland Kitchen                 Plan - 06/28/20 1052    Clinical Impression Statement The patient reports variable response to the ionto patch but wants to finish the series.  We did alter the placement for comfort.  Much improved right grip strength by 10#.  Her primary complaint is elbow pain with using her thumb on the remote and with gripping a plate.  With applying a lateral directional mob with movement she is able to perform 2 sets of 10 painfree.  Added shoulder/scapular strengthening.  She is initially concerned it would irritate her costocondritis but she is able to perform reps without pain.    Comorbidities Cardiac history (keeps nitroglycerin in left pocket at all times); OA; history of neck and back pain    Rehab Potential Good    PT Frequency 2x / week    PT Duration 8 weeks    PT Treatment/Interventions ADLs/Self Care Home Management;Iontophoresis 4mg /ml Dexamethasone;Electrical Stimulation;Cryotherapy;Moist Heat;Ultrasound;Neuromuscular re-education;Therapeutic exercise;Therapeutic activities;Patient/family education;Manual techniques;Dry needling;Joint Manipulations;Taping    PT Next Visit Plan last ionto; review mobs with movement; shoulder/scapular strengthening to prepare for return to tennis    PT Home Exercise Plan [email or web address]           Patient will benefit from skilled therapeutic intervention in order to improve the following deficits and  impairments:  Pain,Impaired UE functional use,Decreased strength  Visit Diagnosis: Pain in right elbow  Muscle weakness (generalized)     Problem List Patient Active Problem List   Diagnosis Date Noted  . Acute blood loss anemia transfused 2 U PRBCs 02/11/18 02/13/2018  . Chronic combined systolic and diastolic HF (heart failure) ([place]) 02/13/2018  . Intramuscular hematoma 02/13/2018  . Retroperitoneal hemorrhage complicating cardiac catheterization 02/11/2018  . CAD in native artery 12/04/2015  . Cardiomyopathy, ischemic 12/04/2015  . CAD (coronary artery disease), native coronary artery 12/04/2015  . History of pulmonary embolism 11/25/2015  . Lactic acidosis 11/25/2015  . AKI (acute kidney injury) ([place]) 11/25/2015  . Aspiration pneumonia ([place]) 11/25/2015  . Hyperglycemia 11/25/2015  . Normocytic anemia 11/25/2015  . Acute respiratory failure with hypoxia ([place])   . Cardiogenic shock ([place])   . NSTEMI (non-ST elevated myocardial infarction) ([place])   . Cardiac arrest ([place]) 11/19/2015  . Angina pectoris ([place]) 08/28/2015  . Retinal detachment 05/07/2014  . OA (osteoarthritis) of knee 01/08/2014  . Preoperative respiratory examination 11/22/2013  . Pulmonary embolism ([place]) 04/18/2013  . Chronic back pain 04/18/2013  . Dyspnea on exertion 04/06/2013  . 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 Hypothyroidism   . Hypercholesterolemia    Ruben Im, PT 06/28/20 12:28 PM Phone: [phone] Fax: [phone] Alvera Singh 06/28/2020, 12:28 PM   [facility] [address] [place], [place], [address] Phone: [phone]   Fax:  [phone]  Name: Brittany Mason MRN: [id number] Date of Birth: 08-22-1950

## 2020-07-03 ENCOUNTER — Encounter: Payer: Self-pay | Admitting: Physical Therapy

## 2020-07-03 ENCOUNTER — Other Ambulatory Visit: Payer: Self-pay

## 2020-07-03 ENCOUNTER — Ambulatory Visit: Payer: Medicare Other | Admitting: Physical Therapy

## 2020-07-03 DIAGNOSIS — M25521 Pain in right elbow: Secondary | ICD-10-CM | POA: Diagnosis not present

## 2020-07-03 DIAGNOSIS — M6281 Muscle weakness (generalized): Secondary | ICD-10-CM

## 2020-07-03 NOTE — Therapy (Signed)
[facility] [address] [place], [place], [address] Phone: [phone]   Fax:  [phone]  Physical Therapy Treatment  Patient Details  Name: Brittany Mason MRN: [id number] Date of Birth: [birth year] Referring Provider (PT): Dr. Hulan Fess   Encounter Date: 07/03/2020   PT End of Session - 07/03/20 1145    Visit Number 8    Date for PT Re-Evaluation 07/18/20    Authorization Type Medicare BCBS    PT Start Time 1145    PT Stop Time 1223    PT Time Calculation (min) 38 min    Activity Tolerance Patient tolerated treatment well    Behavior During Therapy [facility] for tasks assessed/performed           Past Medical History:  Diagnosis Date  . Acute pulmonary embolism ([place]) 06/20/2015  . Arthritis    osteoarthritis-knees. Chronic back pain  . Basal cell carcinoma of right shoulder    "burned off"  . CAD in native artery    a. V fib arrest 2017 s/p DES to LCx with occluded dCx. b. repeat cath 02/2018 treated medically (see report) - c/b RPH.  . Chronic combined systolic and diastolic HF (heart failure) ([place]) 02/13/2018  . Chronic SI joint pain   . DVT (deep venous thrombosis) ([place]) 04/2013   left lower leg, resulted in Pulmonary emboli on hormone therapy  . Dyspnea on exertion   . GERD (gastroesophageal reflux disease)   . Hypercholesterolemia   . Hypothyroidism   . Intramuscular hematoma 02/13/2018  . Migraine    "< once/month" (12/04/2015)  . MVP (mitral valve prolapse)    asymptomatic  . Myocardial infarction ([place]) 11/19/2015   cardiac arrest  . PE (pulmonary embolism) Apr 18, 2013   tx. -using Xarelto now, no further lung problems, denies SOB on 01-02-14  . PONV (postoperative nausea and vomiting)   . Retroperitoneal hematoma   . RLS (restless legs syndrome)     Past Surgical History:  Procedure Laterality Date  . ANTERIOR CRUCIATE LIGAMENT REPAIR Left 1976; 1981   "open"  . CARDIAC CATHETERIZATION N/A  11/20/2015   Procedure: Left Heart Cath and Coronary Angiography;  Surgeon: Belva Crome, MD;  Location: [place] [facility];  Service: Cardiovascular;  Laterality: N/A;  . CARDIAC CATHETERIZATION N/A 12/04/2015   Procedure: Left Heart Cath and Coronary Angiography;  Surgeon: Peter M Martinique, MD;  Location: [place] [facility];  Service: Cardiovascular;  Laterality: N/A;  . CARDIAC CATHETERIZATION  12/04/2015   Procedure: Coronary Stent Intervention;  Surgeon: Peter M Martinique, MD;  Location: [place] [facility];  Service: Cardiovascular;;  . COLONOSCOPY  09/28/2011   Procedure: COLONOSCOPY;  Surgeon: Juanita Craver, MD;  [facility];  Service: Endoscopy;  Laterality: N/A;  . COLONOSCOPY WITH PROPOFOL N/A 01/05/2017   Procedure: COLONOSCOPY WITH PROPOFOL;  Surgeon: Juanita Craver, MD;  [facility];  Service: Endoscopy;  Laterality: N/A;  . INTRAVASCULAR PRESSURE WIRE/FFR STUDY N/A 02/11/2018   Procedure: INTRAVASCULAR PRESSURE WIRE/FFR STUDY;  Surgeon: Sherren Mocha, MD;  Location: [place] [facility];  Service: Cardiovascular;  Laterality: N/A;  . JOINT REPLACEMENT    . LEFT HEART CATH AND CORONARY ANGIOGRAPHY N/A 10/08/2016   Procedure: Left Heart Cath and Coronary Angiography;  Surgeon: Peter M Martinique, MD;  Location: [place] [facility];  Service: Cardiovascular;  Laterality: N/A;  . LEFT HEART CATH AND CORONARY ANGIOGRAPHY N/A 02/11/2018   Procedure: LEFT HEART CATH AND CORONARY ANGIOGRAPHY;  Surgeon: Sherren Mocha, MD;  Location: [place] [facility];  Service: Cardiovascular;  Laterality: N/A;  . TOTAL ABDOMINAL HYSTERECTOMY  1998  . TOTAL KNEE ARTHROPLASTY Left 01/08/2014   Procedure: LEFT TOTAL KNEE ARTHROPLASTY;  Surgeon: Gearlean Alf, MD;  [facility];  Service: Orthopedics;  Laterality: Left;    There were no vitals filed for this visit.   Subjective Assessment - 07/03/20 1147    Subjective Elbow was aggrevated by the "thumb exercise" it took two days for it to calm down.     Pertinent History Heart attack 3 years ago;  keeps nitroglycerin at all times in left pocket    Currently in Pain? Yes    Pain Score 2     Pain Location Elbow    Pain Orientation Right    Pain Descriptors / Indicators Dull    Aggravating Factors  Lifting heavier items still painful, but better lifting lighter items like the TV remote    Pain Relieving Factors not sure    Multiple Pain Sites No                             [facility] Adult PT Treatment/Exercise - 07/03/20 0001      Shoulder Exercises: Standing   Other Standing Exercises shoulder horizontal abduction with red band wrapped around wrists 15x2   good return demo   Other Standing Exercises light green loop wall walk backward Cs 10x   good return demo, no pain     Wrist Exercises   Wrist Flexion Strengthening;Right;20 reps;Seated;Bar weights/barbell    Wrist Flexion Limitations 3#    Wrist Extension Strengthening;Right;20 reps;Seated;Bar weights/barbell    Wrist Extension Limitations 2#      Iontophoresis   Type of Iontophoresis Dexamethasone   #5 skin intact   Location Rt elbow    Dose 1 ml    Time 4 hour wear                    PT Short Term Goals - 06/28/20 1227      PT SHORT TERM GOAL #1   Title indpendent with initial HEP    Status Achieved      PT SHORT TERM GOAL #2   Title The patient will report a 30% improvement in right lateral elbow pain with using the remote, lifting a plate    Status Achieved      PT SHORT TERM GOAL #3   Title Right grip with bent elbow improved to 45# needed for opening jars    Status Achieved             PT Long Term Goals - 05/23/20 2015      PT LONG TERM GOAL #1   Title independent with HEP    Time 8    Period Weeks    Status New    Target Date 07/18/20      PT LONG TERM GOAL #2   Title The patient will report a 60% reduction in right elbow pain with using the remote, lifting a plate, and playing tennis and pickleball    Time 8     Period Weeks    Status New      PT LONG TERM GOAL #3   Title Right grip strength improved to 50# needed for opening jars    Time 8    Period Weeks    Status New      PT  LONG TERM GOAL #4   Title FOTO functional outcome score improved from 55% to 38% limitation    Time 8    Period Weeks    Status New      PT LONG TERM GOAL #5   Title .Marland KitchenMarland Kitchen                 Plan - 07/03/20 1154    Clinical Impression Statement Pt reports still 20%-25% improvement since eval. Pt reports the "exercises we did where I moved my thumb really aggrevated my elbow for 2 days. Pt is compliant and independent in new shoulder exercises. Pt reports new placement of ionto patch was better on her elbow. Pt has 1 patch left.    Personal Factors and Comorbidities Comorbidity 1;Comorbidity 2;Comorbidity 3+    Comorbidities Cardiac history (keeps nitroglycerin in left pocket at all times); OA; history of neck and back pain    Examination-Activity Limitations Other;Lift    Examination-Participation Restrictions Meal Prep;Cleaning;Other    Stability/Clinical Decision Making Stable/Uncomplicated    Rehab Potential Good    PT Frequency 2x / week    PT Duration 8 weeks    PT Treatment/Interventions ADLs/Self Care Home Management;Iontophoresis 4mg /ml Dexamethasone;Electrical Stimulation;Cryotherapy;Moist Heat;Ultrasound;Neuromuscular re-education;Therapeutic exercise;Therapeutic activities;Patient/family education;Manual techniques;Dry needling;Joint Manipulations;Taping    PT Next Visit Plan last ionto, shoulde be #6, continue with shoulder strengthening    PT Home Exercise Plan [email or web address]    Consulted and Agree with Plan of Care Patient           Patient will benefit from skilled therapeutic intervention in order to improve the following deficits and impairments:  Pain,Impaired UE functional use,Decreased strength  Visit Diagnosis: Pain in right elbow  Muscle weakness (generalized)     Problem  List Patient Active Problem List   Diagnosis Date Noted  . Acute blood loss anemia transfused 2 U PRBCs 02/11/18 02/13/2018  . Chronic combined systolic and diastolic HF (heart failure) ([place]) 02/13/2018  . Intramuscular hematoma 02/13/2018  . Retroperitoneal hemorrhage complicating cardiac catheterization 02/11/2018  . CAD in native artery 12/04/2015  . Cardiomyopathy, ischemic 12/04/2015  . CAD (coronary artery disease), native coronary artery 12/04/2015  . History of pulmonary embolism 11/25/2015  . Lactic acidosis 11/25/2015  . AKI (acute kidney injury) ([place]) 11/25/2015  . Aspiration pneumonia ([place]) 11/25/2015  . Hyperglycemia 11/25/2015  . Normocytic anemia 11/25/2015  . Acute respiratory failure with hypoxia ([place])   . Cardiogenic shock ([place])   . NSTEMI (non-ST elevated myocardial infarction) ([place])   . Cardiac arrest ([place]) 11/19/2015  . Angina pectoris ([place]) 08/28/2015  . Retinal detachment 05/07/2014  . OA (osteoarthritis) of knee 01/08/2014  . Preoperative respiratory examination 11/22/2013  . Pulmonary embolism ([place]) 04/18/2013  . Chronic back pain 04/18/2013  . Dyspnea on exertion 04/06/2013  . 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 Hypothyroidism   . Hypercholesterolemia     Kari Kerth , PTA 07/03/2020, 1:54 PM  [facility] [address], [place], [place], [address] Phone: [phone]   Fax:  [phone]  Name: JALILA GOODNOUGH MRN: [id number] Date of Birth: [birth year]

## 2020-07-05 ENCOUNTER — Ambulatory Visit: Payer: Medicare Other | Admitting: Physical Therapy

## 2020-07-05 ENCOUNTER — Other Ambulatory Visit: Payer: Self-pay

## 2020-07-05 DIAGNOSIS — M25521 Pain in right elbow: Secondary | ICD-10-CM | POA: Diagnosis not present

## 2020-07-05 DIAGNOSIS — M6281 Muscle weakness (generalized): Secondary | ICD-10-CM

## 2020-07-05 NOTE — Therapy (Signed)
[facility] [address], [place], [place], [address] Phone: [phone]   Fax:  [phone]  Physical Therapy Treatment  Patient Details  Name: Brittany Mason MRN: [id number] Date of Birth: 08/17/1950 Referring Provider (PT): Dr. Hulan Fess   Encounter Date: 07/05/2020   PT End of Session - 07/05/20 1151    Visit Number 9    Date for PT Re-Evaluation 07/18/20    Authorization Type Medicare BCBS    PT Start Time 1100    PT Stop Time 1145    PT Time Calculation (min) 45 min    Activity Tolerance Patient tolerated treatment well           Past Medical History:  Diagnosis Date  . Acute pulmonary embolism ([place]) 06/20/2015  . Arthritis    osteoarthritis-knees. Chronic back pain  . Basal cell carcinoma of right shoulder    "burned off"  . CAD in native artery    a. V fib arrest 2017 s/p DES to LCx with occluded dCx. b. repeat cath 02/2018 treated medically (see report) - c/b RPH.  . Chronic combined systolic and diastolic HF (heart failure) ([place]) 02/13/2018  . Chronic SI joint pain   . DVT (deep venous thrombosis) ([place]) 04/2013   left lower leg, resulted in Pulmonary emboli on hormone therapy  . Dyspnea on exertion   . GERD (gastroesophageal reflux disease)   . Hypercholesterolemia   . Hypothyroidism   . Intramuscular hematoma 02/13/2018  . Migraine    "< once/month" (12/04/2015)  . MVP (mitral valve prolapse)    asymptomatic  . Myocardial infarction ([place]) 11/19/2015   cardiac arrest  . PE (pulmonary embolism) Apr 18, 2013   tx. -using Xarelto now, no further lung problems, denies SOB on 01-02-14  . PONV (postoperative nausea and vomiting)   . Retroperitoneal hematoma   . RLS (restless legs syndrome)     Past Surgical History:  Procedure Laterality Date  . ANTERIOR CRUCIATE LIGAMENT REPAIR Left 1976; 1981   "open"  . CARDIAC CATHETERIZATION N/A 11/20/2015   Procedure: Left Heart Cath and Coronary  Angiography;  Surgeon: Belva Crome, MD;  Location: [place] [facility];  Service: Cardiovascular;  Laterality: N/A;  . CARDIAC CATHETERIZATION N/A 12/04/2015   Procedure: Left Heart Cath and Coronary Angiography;  Surgeon: Peter M Martinique, MD;  Location: [place] [facility];  Service: Cardiovascular;  Laterality: N/A;  . CARDIAC CATHETERIZATION  12/04/2015   Procedure: Coronary Stent Intervention;  Surgeon: Peter M Martinique, MD;  Location: [place] [facility];  Service: Cardiovascular;;  . COLONOSCOPY  09/28/2011   Procedure: COLONOSCOPY;  Surgeon: Juanita Craver, MD;  [facility];  Service: Endoscopy;  Laterality: N/A;  . COLONOSCOPY WITH PROPOFOL N/A 01/05/2017   Procedure: COLONOSCOPY WITH PROPOFOL;  Surgeon: Juanita Craver, MD;  [facility];  Service: Endoscopy;  Laterality: N/A;  . INTRAVASCULAR PRESSURE WIRE/FFR STUDY N/A 02/11/2018   Procedure: INTRAVASCULAR PRESSURE WIRE/FFR STUDY;  Surgeon: Sherren Mocha, MD;  Location: [place] [facility];  Service: Cardiovascular;  Laterality: N/A;  . JOINT REPLACEMENT    . LEFT HEART CATH AND CORONARY ANGIOGRAPHY N/A 10/08/2016   Procedure: Left Heart Cath and Coronary Angiography;  Surgeon: Peter M Martinique, MD;  Location: [place] [facility];  Service: Cardiovascular;  Laterality: N/A;  . LEFT HEART CATH AND CORONARY ANGIOGRAPHY N/A 02/11/2018   Procedure: LEFT HEART CATH AND CORONARY ANGIOGRAPHY;  Surgeon: Sherren Mocha, MD;  Location: [place] [facility];  Service: Cardiovascular;  Laterality: N/A;  . TOTAL ABDOMINAL HYSTERECTOMY  1998  . TOTAL KNEE ARTHROPLASTY Left 01/08/2014   Procedure: LEFT TOTAL KNEE ARTHROPLASTY;  Surgeon: Gearlean Alf, MD;  [facility];  Service: Orthopedics;  Laterality: Left;    There were no vitals filed for this visit.   Subjective Assessment - 07/05/20 1104    Subjective I didn't continue with the self mob.  It aggravated me so I stopped.  I can do better with the remote and gripping smaller light plate.   I think the ionto patch helped.    Pertinent History Heart attack 3 years ago;  keeps nitroglycerin at all times in left pocket    Diagnostic tests none    Patient Stated Goals play tennis again    Currently in Pain? Yes    Pain Score 1     Pain Location Elbow                             [facility] Adult PT Treatment/Exercise - 07/05/20 0001      Elbow Exercises   Other elbow exercises red Flex Bar eccentric strengthening 10x      Shoulder Exercises: Standing   Other Standing Exercises kneeling on mat 2# cuff weight shoulder extension, horizontal abduction and scaption 10x    Other Standing Exercises light green loop clocks on wall 10x      Iontophoresis   Type of Iontophoresis Dexamethasone   last one   Location Rt elbow    Dose 1 ml    Time 4 hour wear      Manual Therapy   Manual therapy comments pt interested in a massage gun, tried Addady on her thigh/hip; may try smaller attachment on forearm muscles next visit    Soft tissue mobilization Rt wrist extensor mass with  instrument assist                  PT Education - 07/05/20 1139    Education Details kneeling I T Y with cuff weight instead of dumbell    Person(s) Educated Patient    Methods Explanation;Demonstration;Handout    Comprehension Returned demonstration;Verbalized understanding            PT Short Term Goals - 06/28/20 1227      PT SHORT TERM GOAL #1   Title indpendent with initial HEP    Status Achieved      PT SHORT TERM GOAL #2   Title The patient will report a 30% improvement in right lateral elbow pain with using the remote, lifting a plate    Status Achieved      PT SHORT TERM GOAL #3   Title Right grip with bent elbow improved to 45# needed for opening jars    Status Achieved             PT Long Term Goals - 05/23/20 2015      PT LONG TERM GOAL #1   Title independent with HEP    Time 8    Period Weeks    Status New    Target Date 07/18/20      PT LONG  TERM GOAL #2   Title The patient will report a 60% reduction in right elbow pain with using the remote, lifting a plate, and playing tennis and pickleball    Time 8    Period Weeks    Status New  PT LONG TERM GOAL #3   Title Right grip strength improved to 50# needed for opening jars    Time 8    Period Weeks    Status New      PT LONG TERM GOAL #4   Title FOTO functional outcome score improved from 55% to 38% limitation    Time 8    Period Weeks    Status New      PT LONG TERM GOAL #5   Title .Marland KitchenMarland Kitchen                 Plan - 07/05/20 1135    Clinical Impression Statement The patient reports mininal pain with using the remote and lifting lighter plates.  She is able to perform light to medium intensity eccentric strengthening of wrist extensors with FlexBar.   She is able to continue with progressive shoulder strengthening to prepare for return to tennis.  Ionto series completed.  Therapist monitoring all exercises especially eccentric strengthening to ensure not painful.    Comorbidities Cardiac history (keeps nitroglycerin in left pocket at all times); OA; history of neck and back pain    Rehab Potential Good    PT Frequency 2x / week    PT Duration 8 weeks    PT Treatment/Interventions ADLs/Self Care Home Management;Iontophoresis 4mg /ml Dexamethasone;Electrical Stimulation;Cryotherapy;Moist Heat;Ultrasound;Neuromuscular re-education;Therapeutic exercise;Therapeutic activities;Patient/family education;Manual techniques;Dry needling;Joint Manipulations;Taping    PT Next Visit Plan ionto complete; try Addaday with small attachment with forearm muscles;  shoulder/scapular strengthening; follow up on response to FlexBar eccentric strengthening (in Akaiya Touchette's drawer)    PT Home Exercise Plan [email or web address]           Patient will benefit from skilled therapeutic intervention in order to improve the following deficits and impairments:  Pain,Impaired UE functional use,Decreased  strength  Visit Diagnosis: Pain in right elbow  Muscle weakness (generalized)     Problem List Patient Active Problem List   Diagnosis Date Noted  . Acute blood loss anemia transfused 2 U PRBCs 02/11/18 02/13/2018  . Chronic combined systolic and diastolic HF (heart failure) ([place]) 02/13/2018  . Intramuscular hematoma 02/13/2018  . Retroperitoneal hemorrhage complicating cardiac catheterization 02/11/2018  . CAD in native artery 12/04/2015  . Cardiomyopathy, ischemic 12/04/2015  . CAD (coronary artery disease), native coronary artery 12/04/2015  . History of pulmonary embolism 11/25/2015  . Lactic acidosis 11/25/2015  . AKI (acute kidney injury) ([place]) 11/25/2015  . Aspiration pneumonia ([place]) 11/25/2015  . Hyperglycemia 11/25/2015  . Normocytic anemia 11/25/2015  . Acute respiratory failure with hypoxia ([place])   . Cardiogenic shock ([place])   . NSTEMI (non-ST elevated myocardial infarction) ([place])   . Cardiac arrest ([place]) 11/19/2015  . Angina pectoris ([place]) 08/28/2015  . Retinal detachment 05/07/2014  . OA (osteoarthritis) of knee 01/08/2014  . Preoperative respiratory examination 11/22/2013  . Pulmonary embolism ([place]) 04/18/2013  . Chronic back pain 04/18/2013  . Dyspnea on exertion 04/06/2013  . 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 Hypothyroidism   . Hypercholesterolemia    Ruben Im, PT 07/05/20 12:08 PM Phone: [phone] Fax: [phone] Alvera Singh 07/05/2020, 12:07 PM  [facility] [address] [place], [place], [address] Phone: [phone]   Fax:  [phone]  Name: Brittany Mason MRN: [id number] Date of Birth: June 24, 1950

## 2020-07-05 NOTE — Patient Instructions (Signed)
Access Code: [email or web address] URL: [email or web address] Date: 07/05/2020 Prepared by: Ruben Im  Exercises Standing Wrist Flexion Stretch - 1 x daily - 7 x weekly - 1 sets - 10 reps Seated Eccentric Wrist Extension - 1-2 x daily - 7 x weekly - 2 sets - 10 reps Wall Push Up with Plus - 1 x daily - 7 x weekly - 1 sets - 10 reps Standing Shoulder Horizontal Abduction with Resistance - 1 x daily - 7 x weekly - 1 sets - 10 reps Shoulder Flexion Wall Walk - 1 x daily - 7 x weekly - 1 sets - 10 reps Single Arm Bent Over Shoulder Extension with Dumbbell - 1 x daily - 7 x weekly - 1 sets - 10 reps Single Arm Bent Over Shoulder Horizontal Abduction with Dumbbell - Palm Down - 1 x daily - 7 x weekly - 1 sets - 10 reps Single Arm Bent Over Shoulder Scaption with Dumbbell - 1 x daily - 7 x weekly - 1 sets - 10 reps

## 2020-07-11 ENCOUNTER — Ambulatory Visit: Payer: Medicare Other | Attending: Family Medicine | Admitting: Physical Therapy

## 2020-07-11 ENCOUNTER — Other Ambulatory Visit: Payer: Self-pay

## 2020-07-11 DIAGNOSIS — M6281 Muscle weakness (generalized): Secondary | ICD-10-CM | POA: Insufficient documentation

## 2020-07-11 DIAGNOSIS — M25521 Pain in right elbow: Secondary | ICD-10-CM | POA: Diagnosis not present

## 2020-07-11 NOTE — Therapy (Signed)
[facility] [address] [place], [place], [address] Phone: [phone]   Fax:  [phone]  Physical Therapy Treatment  Patient Details  Name: Brittany Mason MRN: [id number] Date of Birth: 1950-09-12 Referring Provider (PT): Dr. Hulan Fess  Progress Note Reporting Period 05/23/2020 to 07/11/2020  See note below for Objective Data and Assessment of Progress/Goals.      Encounter Date: 07/11/2020   PT End of Session - 07/11/20 1717    Visit Number 10    Date for PT Re-Evaluation 07/18/20    Authorization Type Medicare BCBS    PT Start Time 1530    PT Stop Time 1615    PT Time Calculation (min) 45 min    Activity Tolerance Patient tolerated treatment well           Past Medical History:  Diagnosis Date  . Acute pulmonary embolism ([place]) 06/20/2015  . Arthritis    osteoarthritis-knees. Chronic back pain  . Basal cell carcinoma of right shoulder    "burned off"  . CAD in native artery    a. V fib arrest 2017 s/p DES to LCx with occluded dCx. b. repeat cath 02/2018 treated medically (see report) - c/b RPH.  . Chronic combined systolic and diastolic HF (heart failure) ([place]) 02/13/2018  . Chronic SI joint pain   . DVT (deep venous thrombosis) ([place]) 04/2013   left lower leg, resulted in Pulmonary emboli on hormone therapy  . Dyspnea on exertion   . GERD (gastroesophageal reflux disease)   . Hypercholesterolemia   . Hypothyroidism   . Intramuscular hematoma 02/13/2018  . Migraine    "< once/month" (12/04/2015)  . MVP (mitral valve prolapse)    asymptomatic  . Myocardial infarction ([place]) 11/19/2015   cardiac arrest  . PE (pulmonary embolism) Apr 18, 2013   tx. -using Xarelto now, no further lung problems, denies SOB on 01-02-14  . PONV (postoperative nausea and vomiting)   . Retroperitoneal hematoma   . RLS (restless legs syndrome)     Past Surgical History:  Procedure Laterality Date  . ANTERIOR CRUCIATE  LIGAMENT REPAIR Left 1976; 1981   "open"  . CARDIAC CATHETERIZATION N/A 11/20/2015   Procedure: Left Heart Cath and Coronary Angiography;  Surgeon: Belva Crome, MD;  Location: [place] [facility];  Service: Cardiovascular;  Laterality: N/A;  . CARDIAC CATHETERIZATION N/A 12/04/2015   Procedure: Left Heart Cath and Coronary Angiography;  Surgeon: Peter M Martinique, MD;  Location: [place] [facility];  Service: Cardiovascular;  Laterality: N/A;  . CARDIAC CATHETERIZATION  12/04/2015   Procedure: Coronary Stent Intervention;  Surgeon: Peter M Martinique, MD;  Location: [place] [facility];  Service: Cardiovascular;;  . COLONOSCOPY  09/28/2011   Procedure: COLONOSCOPY;  Surgeon: Juanita Craver, MD;  [facility];  Service: Endoscopy;  Laterality: N/A;  . COLONOSCOPY WITH PROPOFOL N/A 01/05/2017   Procedure: COLONOSCOPY WITH PROPOFOL;  Surgeon: Juanita Craver, MD;  [facility];  Service: Endoscopy;  Laterality: N/A;  . INTRAVASCULAR PRESSURE WIRE/FFR STUDY N/A 02/11/2018   Procedure: INTRAVASCULAR PRESSURE WIRE/FFR STUDY;  Surgeon: Sherren Mocha, MD;  Location: [place] [facility];  Service: Cardiovascular;  Laterality: N/A;  . JOINT REPLACEMENT    . LEFT HEART CATH AND CORONARY ANGIOGRAPHY N/A 10/08/2016   Procedure: Left Heart Cath and Coronary Angiography;  Surgeon: Peter M Martinique, MD;  Location: [place] [facility];  Service: Cardiovascular;  Laterality: N/A;  . LEFT HEART CATH AND CORONARY ANGIOGRAPHY  N/A 02/11/2018   Procedure: LEFT HEART CATH AND CORONARY ANGIOGRAPHY;  Surgeon: Sherren Mocha, MD;  Location: [place] [facility];  Service: Cardiovascular;  Laterality: N/A;  . TOTAL ABDOMINAL HYSTERECTOMY  1998  . TOTAL KNEE ARTHROPLASTY Left 01/08/2014   Procedure: LEFT TOTAL KNEE ARTHROPLASTY;  Surgeon: Gearlean Alf, MD;  [facility];  Service: Orthopedics;  Laterality: Left;    There were no vitals filed for this visit.   Subjective Assessment - 07/11/20 1533    Subjective They gave  me childproof med caps and that bothered me.  Using a 2 1/2# adapted wrist brace at home.   Researching massage guns.  Going to help twin sister in [place] this weekend.    Pertinent History Heart attack 3 years ago;  keeps nitroglycerin at all times in left pocket    Patient Stated Goals play tennis again    Currently in Pain? Yes    Pain Score 2     Pain Location Elbow    Pain Orientation Right              [facility] PT Assessment - 07/11/20 0001      Strength   Overall Strength Comments wrist extensors 5-/5    Right Elbow Flexion 5/5    Right Elbow Extension 5/5    Right Hand Grip (lbs) 61# no pain                         [facility] Adult PT Treatment/Exercise - 07/11/20 0001      Elbow Exercises   Other elbow exercises red Flex Bar eccentric strengthening 10x    Other elbow exercises red band assisted wrist extension and 4 count eccentric lowering10x      Shoulder Exercises: Prone   Extension Strengthening;Right;10 reps    Extension Weight (lbs) 2 1/2#    External Rotation Strengthening;Right;10 reps    External Rotation Weight (lbs) 2 1/2#      Shoulder Exercises: Sidelying   External Rotation Strengthening;Right;10 reps    External Rotation Weight (lbs) 2 1/2 #    Flexion Weight (lbs) 2 1/2 wrist weight 10x      Manual Therapy   Soft tissue mobilization Rt wrist extensor mass with  instrument assist Addaday small red attachment and white prong attachment                  PT Education - 07/11/20 1716    Education Details wrist extensor eccentrics; sidelying shoulder external rotation and flexion; prone external rotation and extension    Person(s) Educated Patient    Methods Explanation;Demonstration;Handout    Comprehension Returned demonstration;Verbalized understanding            PT Short Term Goals - 06/28/20 1227      PT SHORT TERM GOAL #1   Title indpendent with initial HEP    Status Achieved      PT SHORT TERM GOAL #2   Title The  patient will report a 30% improvement in right lateral elbow pain with using the remote, lifting a plate    Status Achieved      PT SHORT TERM GOAL #3   Title Right grip with bent elbow improved to 45# needed for opening jars    Status Achieved             PT Long Term Goals - 05/23/20 2015      PT LONG TERM GOAL #1   Title independent with  HEP    Time 8    Period Weeks    Status New    [place] Date 07/18/20      PT LONG TERM GOAL #2   Title The patient will report a 60% reduction in right elbow pain with using the remote, lifting a plate, and playing tennis and pickleball    Time 8    Period Weeks    Status New      PT LONG TERM GOAL #3   Title Right grip strength improved to 50# needed for opening jars    Time 8    Period Weeks    Status New      PT LONG TERM GOAL #4   Title FOTO functional outcome score improved from 55% to 38% limitation    Time 8    Period Weeks    Status New      PT LONG TERM GOAL #5   Title .Marland KitchenMarland Kitchen                 Plan - 07/11/20 1605    Clinical Impression Statement The patient reports no increased pain with wrist extensor eccentrics.  She has a positive response to Addaday instrument assisted soft tissue mobilization.  She requests continued glenohumeral and scapular strengthening to help her return to tennis in the Spring. Progressing with rehab goals.  Therapist monitoring response with all treatment interventions.    Comorbidities Cardiac history (keeps nitroglycerin in left pocket at all times); OA; history of neck and back pain    Rehab Potential Good    PT Frequency 2x / week    PT Duration 8 weeks    PT Treatment/Interventions ADLs/Self Care Home Management;Iontophoresis 4mg /ml Dexamethasone;Electrical Stimulation;Cryotherapy;Moist Heat;Ultrasound;Neuromuscular re-education;Therapeutic exercise;Therapeutic activities;Patient/family education;Manual techniques;Dry needling;Joint Manipulations;Taping    PT Next Visit Plan recheck  grip strength; progress toward goals; FOTO;  wrist extensor isometrics; shoulder/scapular strengthening    PT Home Exercise Plan [id number]           Patient will benefit from skilled therapeutic intervention in order to improve the following deficits and impairments:  Pain,Impaired UE functional use,Decreased strength  Visit Diagnosis: Pain in right elbow  Muscle weakness (generalized)     Problem List Patient Active Problem List   Diagnosis Date Noted  . Acute blood loss anemia transfused 2 U PRBCs 02/11/18 02/13/2018  . Chronic combined systolic and diastolic HF (heart failure) ([place]) 02/13/2018  . Intramuscular hematoma 02/13/2018  . Retroperitoneal hemorrhage complicating cardiac catheterization 02/11/2018  . CAD in native artery 12/04/2015  . Cardiomyopathy, ischemic 12/04/2015  . CAD (coronary artery disease), native coronary artery 12/04/2015  . History of pulmonary embolism 11/25/2015  . Lactic acidosis 11/25/2015  . AKI (acute kidney injury) ([place]) 11/25/2015  . Aspiration pneumonia ([place]) 11/25/2015  . Hyperglycemia 11/25/2015  . Normocytic anemia 11/25/2015  . Acute respiratory failure with hypoxia ([place])   . Cardiogenic shock ([place])   . NSTEMI (non-ST elevated myocardial infarction) ([place])   . Cardiac arrest ([place]) 11/19/2015  . Angina pectoris ([place]) 08/28/2015  . Retinal detachment 05/07/2014  . OA (osteoarthritis) of knee 01/08/2014  . Preoperative respiratory examination 11/22/2013  . Pulmonary embolism ([place]) 04/18/2013  . Chronic back pain 04/18/2013  . Dyspnea on exertion 04/06/2013  . 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 Hypothyroidism   . Hypercholesterolemia    Ruben Im, PT 07/11/20 5:28 PM Phone: [phone] Fax: [phone] Alvera Singh 07/11/2020, 5:27 PM  [facility] [address] [place],  [place], [address] Phone: [phone]   Fax:  [phone]  Name: Brittany Mason MRN:  [id number] Date of Birth: 11-11-1950

## 2020-07-11 NOTE — Patient Instructions (Signed)
Access Code: [email or web address] URL: [email or web address] Date: 07/11/2020 Prepared by: Ruben Im  Exercises Standing Wrist Flexion Stretch - 1 x daily - 7 x weekly - 1 sets - 10 reps Seated Eccentric Wrist Extension - 1-2 x daily - 7 x weekly - 2 sets - 10 reps Wall Push Up with Plus - 1 x daily - 7 x weekly - 1 sets - 10 reps Standing Shoulder Horizontal Abduction with Resistance - 1 x daily - 7 x weekly - 1 sets - 10 reps Shoulder Flexion Wall Walk - 1 x daily - 7 x weekly - 1 sets - 10 reps Single Arm Bent Over Shoulder Extension with Dumbbell - 1 x daily - 7 x weekly - 1 sets - 10 reps Single Arm Bent Over Shoulder Horizontal Abduction with Dumbbell - Palm Down - 1 x daily - 7 x weekly - 1 sets - 10 reps Single Arm Bent Over Shoulder Scaption with Dumbbell - 1 x daily - 7 x weekly - 1 sets - 10 reps Eccentric Wrist Extension with Resistance - 1 x daily - 7 x weekly - 2 sets - 10 reps Sidelying Shoulder ER with Towel and Dumbbell - 1 x daily - 7 x weekly - 1 sets - 10 reps Sidelying Shoulder Flexion 15 Degrees - 1 x daily - 7 x weekly - 2 sets - 10 reps Prone Shoulder External Rotation - 1 x daily - 7 x weekly - 2 sets - 10 reps Prone Shoulder Extension - Single Arm - 1 x daily - 7 x weekly - 2 sets - 10 reps

## 2020-07-15 ENCOUNTER — Encounter: Payer: BLUE CROSS/BLUE SHIELD | Admitting: Physical Therapy

## 2020-07-18 ENCOUNTER — Ambulatory Visit: Payer: Medicare Other | Admitting: Physical Therapy

## 2020-07-18 ENCOUNTER — Other Ambulatory Visit: Payer: Self-pay

## 2020-07-18 DIAGNOSIS — M6281 Muscle weakness (generalized): Secondary | ICD-10-CM

## 2020-07-18 DIAGNOSIS — M25521 Pain in right elbow: Secondary | ICD-10-CM

## 2020-07-18 NOTE — Patient Instructions (Signed)
Access Code: [email or web address] URL: [email or web address] Date: 07/18/2020 Prepared by: Ruben Im  Exercises Standing Wrist Flexion Stretch - 1 x daily - 7 x weekly - 1 sets - 10 reps Seated Eccentric Wrist Extension - 1-2 x daily - 7 x weekly - 2 sets - 10 reps Wall Push Up with Plus - 1 x daily - 7 x weekly - 1 sets - 10 reps Standing Shoulder Horizontal Abduction with Resistance - 1 x daily - 7 x weekly - 1 sets - 10 reps Shoulder Flexion Wall Walk - 1 x daily - 7 x weekly - 1 sets - 10 reps Single Arm Bent Over Shoulder Extension with Dumbbell - 1 x daily - 7 x weekly - 1 sets - 10 reps Single Arm Bent Over Shoulder Horizontal Abduction with Dumbbell - Palm Down - 1 x daily - 7 x weekly - 1 sets - 10 reps Single Arm Bent Over Shoulder Scaption with Dumbbell - 1 x daily - 7 x weekly - 1 sets - 10 reps Eccentric Wrist Extension with Resistance - 1 x daily - 7 x weekly - 2 sets - 10 reps Sidelying Shoulder ER with Towel and Dumbbell - 1 x daily - 7 x weekly - 1 sets - 10 reps Sidelying Shoulder Flexion 15 Degrees - 1 x daily - 7 x weekly - 2 sets - 10 reps Prone Shoulder External Rotation - 1 x daily - 7 x weekly - 2 sets - 10 reps Prone Shoulder Extension - Single Arm - 1 x daily - 7 x weekly - 2 sets - 10 reps Push-Up on Counter - 1 x daily - 7 x weekly - 1 sets - 10 reps Standing Bent Over Single Arm Scapular Row with Table Support - 1 x daily - 7 x weekly - 1 sets - 10 reps Standing Radial Nerve Glide - 1 x daily - 7 x weekly - 1 sets - 10 reps

## 2020-07-18 NOTE — Therapy (Signed)
[facility] [address], [place], [place], [address] Phone: [phone]   Fax:  [phone]  Physical Therapy Treatment/Discharge Summary   Patient Details  Name: Brittany Mason MRN: [id number] Date of Birth: 1950-12-23 Referring Provider (PT): Dr. Hulan Fess   Encounter Date: 07/18/2020   PT End of Session - 07/18/20 1703    Visit Number 11    Date for PT Re-Evaluation 07/18/20    Authorization Type Medicare BCBS    PT Start Time 4818    PT Stop Time 1613    PT Time Calculation (min) 43 min    Activity Tolerance Patient tolerated treatment well           Past Medical History:  Diagnosis Date  . Acute pulmonary embolism ([place]) 06/20/2015  . Arthritis    osteoarthritis-knees. Chronic back pain  . Basal cell carcinoma of right shoulder    "burned off"  . CAD in native artery    a. V fib arrest 2017 s/p DES to LCx with occluded dCx. b. repeat cath 02/2018 treated medically (see report) - c/b RPH.  . Chronic combined systolic and diastolic HF (heart failure) ([place]) 02/13/2018  . Chronic SI joint pain   . DVT (deep venous thrombosis) ([place]) 04/2013   left lower leg, resulted in Pulmonary emboli on hormone therapy  . Dyspnea on exertion   . GERD (gastroesophageal reflux disease)   . Hypercholesterolemia   . Hypothyroidism   . Intramuscular hematoma 02/13/2018  . Migraine    "< once/month" (12/04/2015)  . MVP (mitral valve prolapse)    asymptomatic  . Myocardial infarction ([place]) 11/19/2015   cardiac arrest  . PE (pulmonary embolism) Apr 18, 2013   tx. -using Xarelto now, no further lung problems, denies SOB on 01-02-14  . PONV (postoperative nausea and vomiting)   . Retroperitoneal hematoma   . RLS (restless legs syndrome)     Past Surgical History:  Procedure Laterality Date  . ANTERIOR CRUCIATE LIGAMENT REPAIR Left 1976; 1981   "open"  . CARDIAC CATHETERIZATION N/A 11/20/2015   Procedure: Left Heart Cath  and Coronary Angiography;  Surgeon: Belva Crome, MD;  Location: [place] [facility];  Service: Cardiovascular;  Laterality: N/A;  . CARDIAC CATHETERIZATION N/A 12/04/2015   Procedure: Left Heart Cath and Coronary Angiography;  Surgeon: Peter M Martinique, MD;  Location: [place] [facility];  Service: Cardiovascular;  Laterality: N/A;  . CARDIAC CATHETERIZATION  12/04/2015   Procedure: Coronary Stent Intervention;  Surgeon: Peter M Martinique, MD;  Location: [facility] [facility];  Service: Cardiovascular;;  . COLONOSCOPY  09/28/2011   Procedure: COLONOSCOPY;  Surgeon: Juanita Craver, MD;  [facility];  Service: Endoscopy;  Laterality: N/A;  . COLONOSCOPY WITH PROPOFOL N/A 01/05/2017   Procedure: COLONOSCOPY WITH PROPOFOL;  Surgeon: Juanita Craver, MD;  [facility];  Service: Endoscopy;  Laterality: N/A;  . INTRAVASCULAR PRESSURE WIRE/FFR STUDY N/A 02/11/2018   Procedure: INTRAVASCULAR PRESSURE WIRE/FFR STUDY;  Surgeon: Sherren Mocha, MD;  Location: [facility];  Service: Cardiovascular;  Laterality: N/A;  . JOINT REPLACEMENT    . LEFT HEART CATH AND CORONARY ANGIOGRAPHY N/A 10/08/2016   Procedure: Left Heart Cath and Coronary Angiography;  Surgeon: Peter M Martinique, MD;  Location: [facility];  Service: Cardiovascular;  Laterality: N/A;  . LEFT HEART CATH AND CORONARY ANGIOGRAPHY N/A 02/11/2018   Procedure: LEFT HEART CATH AND CORONARY ANGIOGRAPHY;  Surgeon: Sherren Mocha, MD;  Location: [facility]  [facility];  Service: Cardiovascular;  Laterality: N/A;  . TOTAL ABDOMINAL HYSTERECTOMY  1998  . TOTAL KNEE ARTHROPLASTY Left 01/08/2014   Procedure: LEFT TOTAL KNEE ARTHROPLASTY;  Surgeon: Gearlean Alf, MD;  [facility];  Service: Orthopedics;  Laterality: Left;    There were no vitals filed for this visit.   Subjective Assessment - 07/18/20 1533    Subjective Got an Addaday from [employer].  Some lateral elbow pain with peeling potatoes.    Pertinent History Heart attack 3 years ago;   keeps nitroglycerin at all times in left pocket    Currently in Pain? Yes    Pain Score 1     Pain Location Elbow    Pain Orientation Right              [facility] PT Assessment - 07/18/20 0001      Strength   Left Hand Grip (lbs) 71# bent elbow; 65# straight                         [facility] Adult PT Treatment/Exercise - 07/18/20 0001      Self-Care   Self-Care Other Self-Care Comments    Other Self-Care Comments  discussion of progression of ex's and areas of focus to prepare for return to tennis successfully      Shoulder Exercises: Standing   Other Standing Exercises 7# wt carry    Other Standing Exercises 5# bent rows 10x      Shoulder Exercises: ROM/Strengthening   Pushups Limitations counter push ups; dynamic wt shift at counter 10x each    Other ROM/Strengthening Exercises radial nerve glide 10x      Manual Therapy   Soft tissue mobilization Rt wrist extensor mass with  instrument assist                  PT Education - 07/18/20 1702    Education Details push ups on counter; radial nerve glide; bent rows    Person(s) Educated Patient    Methods Explanation;Demonstration;Handout    Comprehension Returned demonstration;Verbalized understanding            PT Short Term Goals - 07/18/20 1707      PT SHORT TERM GOAL #1   Title indpendent with initial HEP    Status Achieved      PT SHORT TERM GOAL #2   Title The patient will report a 30% improvement in right lateral elbow pain with using the remote, lifting a plate    Status Achieved      PT SHORT TERM GOAL #3   Title Right grip with bent elbow improved to 45# needed for opening jars    Status Achieved             PT Long Term Goals - 07/18/20 1708      PT LONG TERM GOAL #1   Title independent with HEP    Status Achieved      PT LONG TERM GOAL #2   Title The patient will report a 60% reduction in right elbow pain with using the remote, lifting a plate, and playing tennis and  pickleball    Status Partially Met      PT LONG TERM GOAL #3   Title Right grip strength improved to 50# needed for opening jars    Status Achieved      PT LONG TERM GOAL #4   Title FOTO functional outcome score improved from 55% to 38%  limitation    Status Partially Met                 Plan - 07/18/20 1555    Clinical Impression Statement The patient rates her overall progress at only 30% however she expresses readiness to carry on with her home ex program independently.  Her FOTO functional outcome score has improved from 45% to 52%.  Her grip strength has improved dramatically from 40# of force initially to 71# today.  She has purchased a home massage device to address tenderness in wrist extensors.  She has met the majority of goals and expresses readiness for discharge at this time.    Comorbidities Cardiac history (keeps nitroglycerin in left pocket at all times); OA; history of neck and back pain    PT Home Exercise Plan [email or web address]           Patient will benefit from skilled therapeutic intervention in order to improve the following deficits and impairments:     Visit Diagnosis: Pain in right elbow  Muscle weakness (generalized)     Problem List Patient Active Problem List   Diagnosis Date Noted  . Acute blood loss anemia transfused 2 U PRBCs 02/11/18 02/13/2018  . Chronic combined systolic and diastolic HF (heart failure) ([place]) 02/13/2018  . Intramuscular hematoma 02/13/2018  . Retroperitoneal hemorrhage complicating cardiac catheterization 02/11/2018  . CAD in native artery 12/04/2015  . Cardiomyopathy, ischemic 12/04/2015  . CAD (coronary artery disease), native coronary artery 12/04/2015  . History of pulmonary embolism 11/25/2015  . Lactic acidosis 11/25/2015  . AKI (acute kidney injury) ([place]) 11/25/2015  . Aspiration pneumonia ([place]) 11/25/2015  . Hyperglycemia 11/25/2015  . Normocytic anemia 11/25/2015  . Acute respiratory failure with hypoxia ([place])    . Cardiogenic shock ([place])   . NSTEMI (non-ST elevated myocardial infarction) ([place])   . Cardiac arrest ([place]) 11/19/2015  . Angina pectoris ([place]) 08/28/2015  . Retinal detachment 05/07/2014  . OA (osteoarthritis) of knee 01/08/2014  . Preoperative respiratory examination 11/22/2013  . Pulmonary embolism ([place]) 04/18/2013  . Chronic back pain 04/18/2013  . Dyspnea on exertion 04/06/2013  . 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 Hypothyroidism   . Hypercholesterolemia    PHYSICAL THERAPY DISCHARGE SUMMARY  Visits from Start of Care: 11  Current functional level related to goals / functional outcomes: See clinical impressions above   Remaining deficits: As above   Education / Equipment: HEP Plan: Patient agrees to discharge.  Patient goals were partially met. Patient is being discharged due to the patient's request.  ?????         Ruben Im, PT 07/18/20 5:09 PM Phone: [phone] Fax: [phone] Alvera Singh 07/18/2020, 5:08 PM  [facility] [address], [place], [place], [address] Phone: [phone]   Fax:  [phone]  Name: NAEEMA PATLAN MRN: [id number] Date of Birth: 1950/10/19

## 2020-11-11 ENCOUNTER — Telehealth: Payer: Self-pay | Admitting: Cardiovascular Disease

## 2020-11-11 MED ORDER — EZETIMIBE 10 MG PO TABS: 10.0000 mg | ORAL_TABLET | Freq: Every day | ORAL | 1 refills | Status: DC

## 2020-11-11 NOTE — Telephone Encounter (Signed)
*  STAT* If patient is at the pharmacy, call can be transferred to refill team.   1. Which medications need to be refilled? (please list name of each medication and dose if known)   ezetimibe (ZETIA) 10 MG tablet    2. Which pharmacy/location (including street and city if local pharmacy) is medication to be sent to? [place], [place] [address]  3. Do they need a 30 day or 90 day supply? 90 day  Patient has 4 tablets left

## 2020-11-11 NOTE — Telephone Encounter (Signed)
Pt's medication was sent to pt's pharmacy as requested. Confirmation received.  °

## 2021-01-29 ENCOUNTER — Ambulatory Visit (INDEPENDENT_AMBULATORY_CARE_PROVIDER_SITE_OTHER): Payer: Medicare Other | Admitting: Orthopedic Surgery

## 2021-01-29 ENCOUNTER — Other Ambulatory Visit: Payer: Self-pay

## 2021-01-29 ENCOUNTER — Ambulatory Visit: Payer: Self-pay

## 2021-01-29 ENCOUNTER — Encounter: Payer: Self-pay | Admitting: Orthopedic Surgery

## 2021-01-29 DIAGNOSIS — M25512 Pain in left shoulder: Secondary | ICD-10-CM | POA: Diagnosis not present

## 2021-01-29 NOTE — Progress Notes (Signed)
Office Visit Note   Patient: Brittany Mason           Date of Birth: 22-Oct-1950           MRN: [id number] Visit Date: 01/29/2021 Requested by: Hulan Fess, MD [place],  [place] [address] PCP: Hulan Fess, MD  Subjective: Chief Complaint  Patient presents with   Left Shoulder - Pain    HPI: [name] is a 70 year old patient with left shoulder pain of 3 months duration.  She felt a pop when she was rolling over in bed.  Localizes pain to the posterior superior aspect of the shoulder.  Unable to lay on that left-hand side.  The pain wakes her from sleep at night on a majority of nights.  Hurts for her to reach across her body.  Denies any neck pain or numbness and tingling.  Symptoms have been getting worse over the last 3 months.  She has tried over-the-counter medication as well as activity modification and a home stretching program for least 6 weeks none of which have helped.              ROS: All systems reviewed are negative as they relate to the chief complaint within the history of present illness.  Patient denies  fevers or chills.   Assessment & Plan: Visit Diagnoses:  1. Left shoulder pain, unspecified chronicity     Plan: Impression is left shoulder pain with pretty good rotator cuff strength and normal radiographs.  I think this could be labral pathology and a SLAP tear posteriorly which would be degenerative in nature.  Symptoms have been ongoing.  We discussed injection today which she wants to avoid.  Plan MRI arthrogram left shoulder evaluate rotator cuff tear which is less likely but labral pathology I think is most likely.  Follow-up after that study.  Continue with avoiding overhead lifting activities  Follow-Up Instructions: Return for after MRI.   Orders:  Orders Placed This Encounter  Procedures   XR Shoulder Left   MR Shoulder Left w/ contrast   Arthrogram   No orders of the defined types were placed in this encounter.      Procedures: No procedures performed   Clinical Data: No additional findings.  Objective: Vital Signs: There were no vitals taken for this visit.  Physical Exam:   Constitutional: Patient appears well-developed HEENT:  Head: Normocephalic Eyes:EOM are normal Neck: Normal range of motion Cardiovascular: Normal rate Pulmonary/chest: Effort normal Neurologic: Patient is alert Skin: Skin is warm Psychiatric: Patient has normal mood and affect   Ortho Exam: Ortho exam demonstrates good cervical spine range of motion with 5 out of 5 grip EPL FPL interosseous wrist flexion extension bicep triceps and deltoid strength.  Patient has maintained passive range of motion on the left 50/95/170.  She has a little bit of popping with internal and external rotation of the left shoulder at 90 degrees of abduction.  O'Brien's testing is equivocal on the left negative on the right.  No discrete AC joint tenderness is present.  Specialty Comments:  No specialty comments available.  Imaging: XR Shoulder Left  Result Date: 01/29/2021 AP axillary outlet radiographs left shoulder reviewed.  No fracture or dislocation is present.  No glenohumeral joint arthritis or AC joint arthritis.  Acromiohumeral distance maintained.  Visualized lung fields clear.  Normal radiographs left shoulder    PMFS History: Patient Active Problem List   Diagnosis Date Noted   Acute blood loss anemia transfused  2 U PRBCs 02/11/18 02/13/2018   Chronic combined systolic and diastolic HF (heart failure) ([place]) 02/13/2018   Intramuscular hematoma 02/13/2018   Retroperitoneal hemorrhage complicating cardiac catheterization 02/11/2018   CAD in native artery 12/04/2015   Cardiomyopathy, ischemic 12/04/2015   CAD (coronary artery disease), native coronary artery 12/04/2015   History of pulmonary embolism 11/25/2015   Lactic acidosis 11/25/2015   AKI (acute kidney injury) ([place]) 11/25/2015   Aspiration pneumonia ([place]) 11/25/2015    Hyperglycemia 11/25/2015   Normocytic anemia 11/25/2015   Acute respiratory failure with hypoxia [facility])    Cardiogenic shock (HCC)    NSTEMI (non-ST elevated myocardial infarction) ([place])    Cardiac arrest ([place]) 11/19/2015   Angina pectoris ([place]) 08/28/2015   Retinal detachment 05/07/2014   OA (osteoarthritis) of knee 01/08/2014   Preoperative respiratory examination 11/22/2013   Pulmonary embolism ([place]) 04/18/2013   Chronic back pain 04/18/2013   Dyspnea on exertion 04/06/2013   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 Chronic back pain   Basal cell carcinoma of right shoulder    "burned off"   CAD in native artery    a. V fib arrest 2017 s/p DES to LCx with occluded dCx. b. repeat cath 02/2018 treated medically (see report) - c/b RPH.   Chronic combined systolic and diastolic HF (heart failure) (HCC) 02/13/2018   Chronic SI joint pain    DVT (deep venous thrombosis) ([place]) 04/2013   left lower leg, resulted in Pulmonary emboli on hormone therapy   Dyspnea on exertion    GERD (gastroesophageal reflux disease)    Hypercholesterolemia    Hypothyroidism    Intramuscular hematoma 02/13/2018   Migraine    "< once/month" (12/04/2015)   MVP (mitral valve prolapse)    asymptomatic   Myocardial infarction ([place]) 11/19/2015   cardiac arrest   PE (pulmonary embolism) Apr 18, 2013   tx. -using Xarelto now, no further lung problems, denies SOB on 01-02-14   PONV (postoperative nausea and vomiting)    Retroperitoneal hematoma    RLS (restless legs syndrome)     Family History  Problem Relation Age of Onset   Hypertension Mother    Congestive Heart Failure Mother    Coronary artery disease Father    Hypertension Father    Stroke Brother        DIED AT 65   Cancer Maternal Aunt        lung, colon   Breast cancer Maternal Aunt    Breast cancer  Paternal Aunt     Past Surgical History:  Procedure Laterality Date   ANTERIOR CRUCIATE LIGAMENT REPAIR Left 1976; 1981   "open"   CARDIAC CATHETERIZATION N/A 11/20/2015   Procedure: Left Heart Cath and Coronary Angiography;  Surgeon: Belva Crome, MD;  Location: [facility];  Service: Cardiovascular;  Laterality: N/A;   CARDIAC CATHETERIZATION N/A 12/04/2015   Procedure: Left Heart Cath and Coronary Angiography;  Surgeon: Peter M Martinique, MD;  Location: [facility];  Service: Cardiovascular;  Laterality: N/A;   CARDIAC CATHETERIZATION  12/04/2015   Procedure: Coronary Stent Intervention;  Surgeon: Peter M Martinique, MD;  Location: [facility];  Service: Cardiovascular;;   COLONOSCOPY  09/28/2011   Procedure: COLONOSCOPY;  Surgeon: Juanita Craver, MD;  [facility];  Service: Endoscopy;  Laterality: N/A;   COLONOSCOPY WITH PROPOFOL  N/A 01/05/2017   Procedure: COLONOSCOPY WITH PROPOFOL;  Surgeon: Juanita Craver, MD;  Location: [facility];  Service: Endoscopy;  Laterality: N/A;   INTRAVASCULAR PRESSURE WIRE/FFR STUDY N/A 02/11/2018   Procedure: INTRAVASCULAR PRESSURE WIRE/FFR STUDY;  Surgeon: Sherren Mocha, MD;  Location: [facility];  Service: Cardiovascular;  Laterality: N/A;   JOINT REPLACEMENT     LEFT HEART CATH AND CORONARY ANGIOGRAPHY N/A 10/08/2016   Procedure: Left Heart Cath and Coronary Angiography;  Surgeon: Peter M Martinique, MD;  Location: [facility];  Service: Cardiovascular;  Laterality: N/A;   LEFT HEART CATH AND CORONARY ANGIOGRAPHY N/A 02/11/2018   Procedure: LEFT HEART CATH AND CORONARY ANGIOGRAPHY;  Surgeon: Sherren Mocha, MD;  Location: [facility];  Service: Cardiovascular;  Laterality: N/A;   TOTAL ABDOMINAL HYSTERECTOMY  1998   TOTAL KNEE ARTHROPLASTY Left 01/08/2014   Procedure: LEFT TOTAL KNEE ARTHROPLASTY;  Surgeon: Gearlean Alf, MD;  [facility]: [facility];  Service: Orthopedics;  Laterality: Left;   Social History   Occupational  History   Not on file  Tobacco Use   Smoking status: Never   Smokeless tobacco: Never  Vaping Use   Vaping Use: Never used  Substance and Sexual Activity   Alcohol use: No   Drug use: No   Sexual activity: Never    Birth control/protection: Surgical    Comment: HYSTERECTOMY

## 2021-02-06 ENCOUNTER — Other Ambulatory Visit: Payer: Self-pay | Admitting: Cardiovascular Disease

## 2021-02-20 ENCOUNTER — Ambulatory Visit
Admission: RE | Admit: 2021-02-20 | Discharge: 2021-02-20 | Disposition: A | Discharge status: Home / Self Care | Payer: Medicare Other | Source: Ambulatory Visit | Attending: Orthopedic Surgery | Admitting: Orthopedic Surgery

## 2021-02-20 ENCOUNTER — Other Ambulatory Visit: Payer: Self-pay

## 2021-02-20 DIAGNOSIS — M25512 Pain in left shoulder: Secondary | ICD-10-CM

## 2021-02-20 MED ORDER — IOPAMIDOL (ISOVUE-M 200) INJECTION 41%
15.0000 mL | Freq: Once | INTRAMUSCULAR | Status: AC
  Administered 2021-02-20: 15 mL via INTRA_ARTICULAR

## 2021-03-05 ENCOUNTER — Ambulatory Visit (INDEPENDENT_AMBULATORY_CARE_PROVIDER_SITE_OTHER): Payer: Medicare Other | Admitting: Orthopedic Surgery

## 2021-03-05 ENCOUNTER — Other Ambulatory Visit: Payer: Self-pay

## 2021-03-05 ENCOUNTER — Encounter: Payer: Self-pay | Admitting: Orthopedic Surgery

## 2021-03-05 DIAGNOSIS — M1389 Other specified arthritis, multiple sites: Secondary | ICD-10-CM

## 2021-03-05 DIAGNOSIS — M19012 Primary osteoarthritis, left shoulder: Secondary | ICD-10-CM

## 2021-03-06 ENCOUNTER — Encounter: Payer: Self-pay | Admitting: Orthopedic Surgery

## 2021-03-06 MED ORDER — BUPIVACAINE HCL 0.5 % IJ SOLN
9.0000 mL | INTRAMUSCULAR | Status: AC | PRN
  Administered 2021-03-05: 9 mL via INTRA_ARTICULAR

## 2021-03-06 MED ORDER — METHYLPREDNISOLONE ACETATE 40 MG/ML IJ SUSP
40.0000 mg | INTRAMUSCULAR | Status: AC | PRN
  Administered 2021-03-05: 40 mg via INTRA_ARTICULAR

## 2021-03-06 MED ORDER — LIDOCAINE HCL 1 % IJ SOLN
5.0000 mL | INTRAMUSCULAR | Status: AC | PRN
  Administered 2021-03-05: 5 mL

## 2021-03-06 NOTE — Progress Notes (Signed)
Office Visit Note   Patient: Brittany Mason           Date of Birth: Mar 18, 1951           MRN: [id number] Visit Date: 03/05/2021 Requested by: Hulan Fess, MD [place],  [place] [address] PCP: Hulan Fess, MD  Subjective: Chief Complaint  Patient presents with   Left Shoulder - Follow-up    HPI: [name] is a 70 year old patient with left shoulder pain.  Here to review MRI scan.  MRI scan shows intact rotator cuff with some tendinitis.  She does have mild to moderate glenohumeral joint arthritis.  There is no thickening of the inferior capsule.  Biceps tendon has some tendinosis but no obvious SLAP tear.  AC joint has some edema as well.  Overall she reports constant aching pains.  She does take morphine for her back.  No falls or injuries.  No radicular symptoms.  Has a hard time with circumduction of the left arm.  Symptoms going on for about 3 months.  Denies any history of injury.  She states she did feel a "pop" about 3 months ago when the symptoms began.  She cannot take anti-inflammatories.              ROS: All systems reviewed are negative as they relate to the chief complaint within the history of present illness.  Patient denies  fevers or chills.   Assessment & Plan: Visit Diagnoses:  1. Arthritis of left shoulder region     Plan: Impression is left shoulder mild arthritis with some bursitis as well.  She localizes her pain discretely to the deltoid region.  No AC joint symptoms present even though she has some arthropathy on MRI scan.  Plan at this time is divided glenohumeral and subacromial space injection.  6-week return for clinical recheck.  Does not look like frozen shoulder at this time.  Not sure what the pop was that initiated her pain symptoms but at this time I would favor observation with no clear-cut indication for surgery.  Follow-Up Instructions: Return in about 6 weeks (around 04/16/2021).   Orders:  No orders of the defined types were  placed in this encounter.  No orders of the defined types were placed in this encounter.     Procedures: Large Joint Inj: L glenohumeral on 03/05/2021 7:14 AM Indications: diagnostic evaluation and pain Details: 18 G 1.5 in needle, posterior approach  Arthrogram: No  Medications: 9 mL bupivacaine 0.5 %; 40 mg methylPREDNISolone acetate 40 MG/ML; 5 mL lidocaine 1 % Outcome: tolerated well, no immediate complications Procedure, treatment alternatives, risks and benefits explained, specific risks discussed. Consent was given by the patient. Immediately prior to procedure a time out was called to verify the correct patient, procedure, equipment, support staff and site/side marked as required. Patient was prepped and draped in the usual sterile fashion.      Clinical Data: No additional findings.  Objective: Vital Signs: There were no vitals taken for this visit.  Physical Exam:   Constitutional: Patient appears well-developed HEENT:  Head: Normocephalic Eyes:EOM are normal Neck: Normal range of motion Cardiovascular: Normal rate Pulmonary/chest: Effort normal Neurologic: Patient is alert Skin: Skin is warm Psychiatric: Patient has normal mood and affect   Ortho Exam: Ortho exam demonstrates full cervical spine range of motion.  Grip EPL FPL interosseous wrist flexion extension bicep tricep deltoid strength intact.  Passive range of motion on the right is 50/100/180.  Passive range of  motion on the left is 45/100/170.  No discrete AC joint tenderness bilaterally.  Rotator cuff strength intact to infraspinatus supraspinatus and subscap muscle testing bilaterally.  No masses lymphadenopathy or skin changes noted in that shoulder region  Specialty Comments:  No specialty comments available.  Imaging: No results found.   PMFS History: Patient Active Problem List   Diagnosis Date Noted   Acute blood loss anemia transfused 2 U PRBCs 02/11/18 02/13/2018   Chronic combined  systolic and diastolic HF (heart failure) ([place]) 02/13/2018   Intramuscular hematoma 02/13/2018   Retroperitoneal hemorrhage complicating cardiac catheterization 02/11/2018   CAD in native artery 12/04/2015   Cardiomyopathy, ischemic 12/04/2015   CAD (coronary artery disease), native coronary artery 12/04/2015   History of pulmonary embolism 11/25/2015   Lactic acidosis 11/25/2015   AKI (acute kidney injury) ([place]) 11/25/2015   Aspiration pneumonia ([place]) 11/25/2015   Hyperglycemia 11/25/2015   Normocytic anemia 11/25/2015   Acute respiratory failure with hypoxia [facility])    Cardiogenic shock (HCC)    NSTEMI (non-ST elevated myocardial infarction) ([place])    Cardiac arrest ([place]) 11/19/2015   Angina pectoris ([place]) 08/28/2015   Retinal detachment 05/07/2014   OA (osteoarthritis) of knee 01/08/2014   Preoperative respiratory examination 11/22/2013   Pulmonary embolism ([place]) 04/18/2013   Chronic back pain 04/18/2013   Dyspnea on exertion 04/06/2013   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 Chronic back pain   Basal cell carcinoma of right shoulder    "burned off"   CAD in native artery    a. V fib arrest 2017 s/p DES to LCx with occluded dCx. b. repeat cath 02/2018 treated medically (see report) - c/b RPH.   Chronic combined systolic and diastolic HF (heart failure) (HCC) 02/13/2018   Chronic SI joint pain    DVT (deep venous thrombosis) ([place]) 04/2013   left lower leg, resulted in Pulmonary emboli on hormone therapy   Dyspnea on exertion    GERD (gastroesophageal reflux disease)    Hypercholesterolemia    Hypothyroidism    Intramuscular hematoma 02/13/2018   Migraine    "< once/month" (12/04/2015)   MVP (mitral valve prolapse)    asymptomatic   Myocardial infarction ([place]) 11/19/2015   cardiac arrest   PE (pulmonary embolism) Apr 18, 2013    tx. -using Xarelto now, no further lung problems, denies SOB on 01-02-14   PONV (postoperative nausea and vomiting)    Retroperitoneal hematoma    RLS (restless legs syndrome)     Family History  Problem Relation Age of Onset   Hypertension Mother    Congestive Heart Failure Mother    Coronary artery disease Father    Hypertension Father    Stroke Brother        DIED AT 45   Cancer Maternal Aunt        lung, colon   Breast cancer Maternal Aunt    Breast cancer Paternal Aunt     Past Surgical History:  Procedure Laterality Date   ANTERIOR CRUCIATE LIGAMENT REPAIR Left 1976; 1981   "open"   CARDIAC CATHETERIZATION N/A 11/20/2015   Procedure: Left Heart Cath and Coronary Angiography;  Surgeon: Belva Crome, MD;  Location: [facility];  Service: Cardiovascular;  Laterality: N/A;   CARDIAC CATHETERIZATION N/A 12/04/2015   Procedure: Left Heart Cath and Coronary Angiography;  Surgeon: Collier Salina  M Martinique, MD;  Location: [facility];  Service: Cardiovascular;  Laterality: N/A;   CARDIAC CATHETERIZATION  12/04/2015   Procedure: Coronary Stent Intervention;  Surgeon: Peter M Martinique, MD;  Location: [facility];  Service: Cardiovascular;;   COLONOSCOPY  09/28/2011   Procedure: COLONOSCOPY;  Surgeon: Juanita Craver, MD;  [facility];  Service: Endoscopy;  Laterality: N/A;   COLONOSCOPY WITH PROPOFOL N/A 01/05/2017   Procedure: COLONOSCOPY WITH PROPOFOL;  Surgeon: Juanita Craver, MD;  [facility];  Service: Endoscopy;  Laterality: N/A;   INTRAVASCULAR PRESSURE WIRE/FFR STUDY N/A 02/11/2018   Procedure: INTRAVASCULAR PRESSURE WIRE/FFR STUDY;  Surgeon: Sherren Mocha, MD;  Location: [facility];  Service: Cardiovascular;  Laterality: N/A;   JOINT REPLACEMENT     LEFT HEART CATH AND CORONARY ANGIOGRAPHY N/A 10/08/2016   Procedure: Left Heart Cath and Coronary Angiography;  Surgeon: Peter M Martinique, MD;  Location: [facility];  Service: Cardiovascular;   Laterality: N/A;   LEFT HEART CATH AND CORONARY ANGIOGRAPHY N/A 02/11/2018   Procedure: LEFT HEART CATH AND CORONARY ANGIOGRAPHY;  Surgeon: Sherren Mocha, MD;  Location: [facility];  Service: Cardiovascular;  Laterality: N/A;   TOTAL ABDOMINAL HYSTERECTOMY  1998   TOTAL KNEE ARTHROPLASTY Left 01/08/2014   Procedure: LEFT TOTAL KNEE ARTHROPLASTY;  Surgeon: Gearlean Alf, MD;  [facility];  Service: Orthopedics;  Laterality: Left;   Social History   Occupational History   Not on file  Tobacco Use   Smoking status: Never   Smokeless tobacco: Never  Vaping Use   Vaping Use: Never used  Substance and Sexual Activity   Alcohol use: No   Drug use: No   Sexual activity: Never    Birth control/protection: Surgical    Comment: HYSTERECTOMY

## 2021-03-09 NOTE — Progress Notes (Signed)
Cardiology Office Note   Date:  03/12/2021   ID:  Brittany, Mason 16-Aug-1950, MRN [id number]  PCP:  Kathyrn Lass, MD  Cardiologist:  Dr. Johnsie Cancel, MD   No chief complaint on file.     History of Present Illness: Brittany Mason is a 70 y.o. female has a history of CAD/VF arrest in 2017 with subsequent PCI of the LCx with residual occluded distal disease.  Repeat cardiac cath 02/2018 with patent LCx stent and chronically occluded distal LCx disease with left to left collaterals and diffusely diseased right PDA not amenable to PCI.  Recommendations were for medical treatment.  Post-cath setting was complicated by retroperitoneal hematoma however fortunately there was no surgery required.  Also with a history of migraines and RLS.  She was seen 03/29/2018 for dizziness at which time carotid Dopplers were performed which showed no stenosis.  She tends to have rare angina.  BP/HR runs low  Seeing Dr Marlou Sa for left shoulder bursitis/arthritis MRI with no tear Post injection Rx 03/05/21  Excited to go to [place] with her roommate to see her daughter and two grand children ages 1/3 No angina   Past Medical History:  Diagnosis Date   Acute pulmonary embolism ([place]) 06/20/2015   Arthritis    osteoarthritis-knees. Chronic back pain   Basal cell carcinoma of right shoulder    "burned off"   CAD in native artery    a. V fib arrest 2017 s/p DES to LCx with occluded dCx. b. repeat cath 02/2018 treated medically (see report) - c/b RPH.   Chronic combined systolic and diastolic HF (heart failure) (HCC) 02/13/2018   Chronic SI joint pain    DVT (deep venous thrombosis) ([place]) 04/2013   left lower leg, resulted in Pulmonary emboli on hormone therapy   Dyspnea on exertion    GERD (gastroesophageal reflux disease)    Hypercholesterolemia    Hypothyroidism    Intramuscular hematoma 02/13/2018   Migraine    "< once/month" (12/04/2015)   MVP (mitral valve prolapse)    asymptomatic   Myocardial  infarction ([place]) 11/19/2015   cardiac arrest   PE (pulmonary embolism) Apr 18, 2013   tx. -using Xarelto now, no further lung problems, denies SOB on 01-02-14   PONV (postoperative nausea and vomiting)    Retroperitoneal hematoma    RLS (restless legs syndrome)     Past Surgical History:  Procedure Laterality Date   ANTERIOR CRUCIATE LIGAMENT REPAIR Left 1976; 1981   "open"   CARDIAC CATHETERIZATION N/A 11/20/2015   Procedure: Left Heart Cath and Coronary Angiography;  Surgeon: Belva Crome, MD;  Location: [place] [facility];  Service: Cardiovascular;  Laterality: N/A;   CARDIAC CATHETERIZATION N/A 12/04/2015   Procedure: Left Heart Cath and Coronary Angiography;  Surgeon: Juddson Cobern M Martinique, MD;  Location: [place] [facility];  Service: Cardiovascular;  Laterality: N/A;   CARDIAC CATHETERIZATION  12/04/2015   Procedure: Coronary Stent Intervention;  Surgeon: North Esterline M Martinique, MD;  Location: [facility];  Service: Cardiovascular;;   COLONOSCOPY  09/28/2011   Procedure: COLONOSCOPY;  Surgeon: Juanita Craver, MD;  [facility];  Service: Endoscopy;  Laterality: N/A;   COLONOSCOPY WITH PROPOFOL N/A 01/05/2017   Procedure: COLONOSCOPY WITH PROPOFOL;  Surgeon: Juanita Craver, MD;  [facility];  Service: Endoscopy;  Laterality: N/A;   INTRAVASCULAR PRESSURE WIRE/FFR STUDY N/A 02/11/2018   Procedure: INTRAVASCULAR PRESSURE WIRE/FFR STUDY;  Surgeon: Sherren Mocha, MD;  Location: [facility];  Service: Cardiovascular;  Laterality: N/A;   JOINT REPLACEMENT     LEFT HEART CATH AND CORONARY ANGIOGRAPHY N/A 10/08/2016   Procedure: Left Heart Cath and Coronary Angiography;  Surgeon: Steffany Schoenfelder M Martinique, MD;  Location: [facility];  Service: Cardiovascular;  Laterality: N/A;   LEFT HEART CATH AND CORONARY ANGIOGRAPHY N/A 02/11/2018   Procedure: LEFT HEART CATH AND CORONARY ANGIOGRAPHY;  Surgeon: Sherren Mocha, MD;  Location: [place];  Service: Cardiovascular;  Laterality:  N/A;   TOTAL ABDOMINAL HYSTERECTOMY  1998   TOTAL KNEE ARTHROPLASTY Left 01/08/2014   Procedure: LEFT TOTAL KNEE ARTHROPLASTY;  Surgeon: Gearlean Alf, MD;  [facility];  Service: Orthopedics;  Laterality: Left;     Current Outpatient Medications  Medication Sig Dispense Refill   aspirin EC 81 MG tablet Take 81 mg by mouth daily with lunch.     butalbital-acetaminophen-caffeine (FIORICET, ESGIC) 50-325-40 MG tablet Take 1 tablet by mouth 2 (two) times daily as needed for headache.     cyclobenzaprine (FLEXERIL) 5 MG tablet Take 5 mg by mouth 3 (three) times daily as needed for muscle spasms.     fenofibrate 160 MG tablet Take 160 mg by mouth at bedtime.      FLUoxetine (PROZAC) 20 MG tablet Take 20 mg by mouth daily.     meclizine (ANTIVERT) 25 MG tablet Take 25 mg by mouth 3 (three) times daily as needed for dizziness.     morphine (MSIR) 30 MG tablet Take 30 mg by mouth daily.     omega-3 acid ethyl esters (LOVAZA) 1 G capsule Take 2 g by mouth 2 (two) times daily.     ondansetron (ZOFRAN) 4 MG tablet Take 4 mg by mouth every 8 (eight) hours as needed for nausea or vomiting.     OVER THE COUNTER MEDICATION Take 2 capsules by mouth daily at 6 PM. "Relizen" herbal supplement for hormone replacement     Polyethyl Glycol-Propyl Glycol (SYSTANE OP) Place 1-2 drops into both eyes daily as needed (for red/itchy eyes).      rOPINIRole (REQUIP) 0.5 MG tablet Take 0.5 mg by mouth at bedtime.      scopolamine (TRANSDERM-SCOP) 1 MG/3DAYS Place 1 patch onto the skin as needed (for travel).     SYNTHROID 150 MCG tablet Take 1 tablet by mouth daily.     clopidogrel (PLAVIX) 75 MG tablet TAKE 1 TABLET(75 MG) BY MOUTH DAILY 90 tablet 3   ezetimibe (ZETIA) 10 MG tablet Take 1 tablet (10 mg total) by mouth daily. 90 tablet 3   isosorbide mononitrate (IMDUR) 30 MG 24 hr tablet Take 0.5 tablets (15 mg total) by mouth daily. 90 tablet 3   metoprolol tartrate (LOPRESSOR) 25 MG tablet TAKE 1/2 TABLET(12.5  MG) BY MOUTH TWICE DAILY 90 tablet 3   nitroGLYCERIN (NITROSTAT) 0.4 MG SL tablet PLACE 1 TABLET UNDER THE TONGUE EVERY 5 MINUTES AS NEEDED FOR CHEST PAIN 25 tablet 6   simvastatin (ZOCOR) 40 MG tablet TAKE 1 TABLET BY MOUTH EVERY DAY AT 6 PM 90 tablet 3   No current facility-administered medications for this visit.    Allergies:   Atorvastatin, Codeine, and Erythromycin    Social History:  The patient  reports that she has never smoked. She has never used smokeless tobacco. She reports that she does not drink alcohol and does not use drugs.   Family History:  [name] family history includes Breast cancer in her maternal aunt and paternal  aunt; Cancer in her maternal aunt; Congestive Heart Failure in her mother; Coronary artery disease in her father; Hypertension in her father and mother; Stroke in her brother.    ROS:  Please see the history of present illness.   Otherwise, review of systems are positive for none.   All other systems are reviewed and negative.    PHYSICAL EXAM: VS:  BP 110/72   Pulse (!) 51   Ht 5\' 3"  (1.6 m)   Wt 74.4 kg   SpO2 97%   BMI 29.05 kg/m  , BMI Body mass index is 29.05 kg/m.   Affect appropriate Healthy:  appears stated age 34: normal Neck supple with no adenopathy JVP normal no bruits no thyromegaly Lungs clear with no wheezing and good diaphragmatic motion Heart:  S1/S2 no murmur, no rub, gallop or click PMI normal Abdomen: benighn, BS positve, no tenderness, no AAA no bruit.  No HSM or HJR Distal pulses intact with no bruits No edema Neuro non-focal Skin warm and dry No muscular weakness    EKG:  EKG is ordered today. The ekg ordered today demonstrates SB with HR 48bpm, no change from prior    Recent Labs: 05/22/2020: BUN 16; Creatinine, Ser 0.89; Potassium 4.5; Sodium 140    Lipid Panel    Component Value Date/Time   CHOL 139 02/13/2018 0450   CHOL 138 11/18/2016 0859   TRIG 75 02/13/2018 0450   HDL 54 02/13/2018  0450   HDL 56 11/18/2016 0859   CHOLHDL 2.6 02/13/2018 0450   VLDL 15 02/13/2018 0450   LDLCALC 70 02/13/2018 0450   LDLCALC 66 11/18/2016 0859      Wt Readings from Last 3 Encounters:  03/12/21 74.4 kg  05/22/20 73.9 kg  11/20/19 73.7 kg    Other studies Reviewed: Additional studies/ records that were reviewed today include:  Review of the above records demonstrates:   Carotid 04/05/18  No obstructive dx   Cath 02/11/18   Conclusion    1. Moderate 2 vessel CAD involving the LCx and RCA and chronic occlusion of the distal LCx collateralized from L--->L collaterals 2. Patent left main and LAD with mild nonobstructive disease 3. Low LVEDP 4. Negative FFR of the left circumflex   Recommend: continued medical therapy. There is progressive stenosis of the right PDA but this vessel is diffusely diseased and not favorable for PCI.   Procedure complicated by hypotension after hemostasis is obtained. 0.5 mg atropine and IV fluids are administered. Groin site is tender into the right lower quadrant. Plan stat non-contrast CT.     ASSESSMENT AND PLAN:  1.  Hypotension/dizziness: -chronically low BP . Only on low dose lopressor and imdur   2.  CAD: -Last LHC 02/11/18 with stable disease with patent PCI to mid LCx and chronically occluded distal LCx disease.  She has left to left collaterals with diffuse PDA disease -Very infrequent CP relieved with SL NTG>>no change since last seen  -If progression, will need to notify office  -Continue low dose lopressor and imdur   3. History of retroperitoneal hematoma post cardiac catheterization: -Occurred post [facility] 02/2018 radial cath in future if possible  4.  HLD: -Continue zocor, zetia and fibrates  -Labs per PCP  5.  Hypothyroidism: -Follows with PCP  6. Ortho:  left shoulder arthritis post injection f/u Dr Marlou Sa   Current medicines are reviewed at length with the patient today.  The patient does not have concerns regarding  medicines.  The following changes  have been made:  no change  Labs/ tests ordered today include:  none No orders of the defined types were placed in this encounter.  Disposition:   FU with me in a year   Signed, Jenkins Rouge, MD  03/12/2021 11:34 AM    [facility] [address], [place], [place]  [address] Phone: [phone]; Fax: [phone]

## 2021-03-10 ENCOUNTER — Other Ambulatory Visit: Payer: Self-pay | Admitting: Cardiovascular Disease

## 2021-03-12 ENCOUNTER — Other Ambulatory Visit: Payer: Self-pay

## 2021-03-12 ENCOUNTER — Ambulatory Visit (INDEPENDENT_AMBULATORY_CARE_PROVIDER_SITE_OTHER): Payer: Medicare Other | Admitting: Cardiovascular Disease

## 2021-03-12 ENCOUNTER — Encounter: Payer: Self-pay | Admitting: Cardiovascular Disease

## 2021-03-12 VITALS — BP 110/72 | HR 51 | Ht 63.0 in | Wt 164.0 lb

## 2021-03-12 DIAGNOSIS — I2510 Atherosclerotic heart disease of native coronary artery without angina pectoris: Secondary | ICD-10-CM

## 2021-03-12 DIAGNOSIS — E785 Hyperlipidemia, unspecified: Secondary | ICD-10-CM | POA: Diagnosis not present

## 2021-03-12 MED ORDER — CLOPIDOGREL BISULFATE 75 MG PO TABS: ORAL_TABLET | ORAL | 3 refills | Status: DC

## 2021-03-12 MED ORDER — EZETIMIBE 10 MG PO TABS: 10.0000 mg | ORAL_TABLET | Freq: Every day | ORAL | 3 refills | Status: DC

## 2021-03-12 MED ORDER — ISOSORBIDE MONONITRATE ER 30 MG PO TB24: 15.0000 mg | ORAL_TABLET | Freq: Every day | ORAL | 3 refills | Status: DC

## 2021-03-12 MED ORDER — NITROGLYCERIN 0.4 MG SL SUBL: SUBLINGUAL_TABLET | SUBLINGUAL | 6 refills | Status: DC

## 2021-03-12 MED ORDER — SIMVASTATIN 40 MG PO TABS: ORAL_TABLET | ORAL | 3 refills | Status: DC

## 2021-03-12 MED ORDER — METOPROLOL TARTRATE 25 MG PO TABS: ORAL_TABLET | ORAL | 3 refills | Status: DC

## 2021-03-12 NOTE — Patient Instructions (Signed)
Medication Instructions:  *If you need a refill on your cardiac medications before your next appointment, please call your pharmacy*  Lab Work: If you have labs (blood work) drawn today and your tests are completely normal, you will receive your results only by: MyChart Message (if you have MyChart) OR A paper copy in the mail If you have any lab test that is abnormal or we need to change your treatment, we will call you to review the results.  Testing/Procedures: None ordered today.  Follow-Up: At [facility], you and your health needs are our priority.  As part of our continuing mission to provide you with exceptional heart care, we have created designated Provider Care Teams.  These Care Teams include your primary Cardiologist (physician) and Advanced Practice Providers (APPs -  Physician Assistants and Nurse Practitioners) who all work together to provide you with the care you need, when you need it.  We recommend signing up for the patient portal called "MyChart".  Sign up information is provided on this After Visit Summary.  MyChart is used to connect with patients for Virtual Visits (Telemedicine).  Patients are able to view lab/test results, encounter notes, upcoming appointments, etc.  Non-urgent messages can be sent to your provider as well.   To learn more about what you can do with MyChart, go to [email or web address].    Your next appointment:   12 month(s)  The format for your next appointment:   In Person  Provider:   You may see [name], MD or one of the following Advanced Practice Providers on your designated Care Team:   [name], NP  

## 2021-04-16 ENCOUNTER — Ambulatory Visit (INDEPENDENT_AMBULATORY_CARE_PROVIDER_SITE_OTHER): Payer: Medicare Other | Admitting: Orthopedic Surgery

## 2021-04-16 ENCOUNTER — Other Ambulatory Visit: Payer: Self-pay

## 2021-04-16 DIAGNOSIS — I2510 Atherosclerotic heart disease of native coronary artery without angina pectoris: Secondary | ICD-10-CM | POA: Diagnosis not present

## 2021-04-16 DIAGNOSIS — M19012 Primary osteoarthritis, left shoulder: Secondary | ICD-10-CM

## 2021-04-19 ENCOUNTER — Encounter: Payer: Self-pay | Admitting: Orthopedic Surgery

## 2021-04-19 NOTE — Progress Notes (Signed)
Office Visit Note   Patient: Brittany Mason           Date of Birth: 1951/03/14           MRN: [id number] Visit Date: 04/16/2021 Requested by: Hulan Fess, MD [place],  [place] [address] PCP: Kathyrn Lass, MD  Subjective: Chief Complaint  Patient presents with   Left Shoulder - Follow-up    HPI: Patient presents for follow-up of left shoulder pain.  She had left shoulder divided injection between the glenohumeral joint and subacromial space on 03/05/2021.  Those notes are reviewed.  She has gotten great relief and is about 90% better.  Laying on the side is still painful.  She is on morphine sulfate for her back.  Has not been doing any exercises for the shoulder.              ROS: All systems reviewed are negative as they relate to the chief complaint within the history of present illness.  Patient denies  fevers or chills.   Assessment & Plan: Visit Diagnoses:  1. Arthritis of left shoulder region     Plan: Impression is left shoulder pain responding well to injection.  Currently she is at a place where she can live with her symptoms.  I would repeat this injection again prior to any surgical intervention.  She will follow-up as needed.  Encouraged her to keep the shoulder as limber as possible avoiding overhead resistive exercises.  Follow-Up Instructions: Return if symptoms worsen or fail to improve.   Orders:  No orders of the defined types were placed in this encounter.  No orders of the defined types were placed in this encounter.     Procedures: No procedures performed   Clinical Data: No additional findings.  Objective: Vital Signs: There were no vitals taken for this visit.  Physical Exam:   Constitutional: Patient appears well-developed HEENT:  Head: Normocephalic Eyes:EOM are normal Neck: Normal range of motion Cardiovascular: Normal rate Pulmonary/chest: Effort normal Neurologic: Patient is alert Skin: Skin is  warm Psychiatric: Patient has normal mood and affect   Ortho Exam: Ortho exam demonstrates full active and passive range of motion of the elbow and wrist.  Left shoulder range of motion has improved around 65/90/170.  Rotator cuff strength is intact.  No discrete AC joint tenderness is present.  No masses lymphadenopathy or skin changes noted in that left shoulder girdle region.  Specialty Comments:  No specialty comments available.  Imaging: No results found.   PMFS History: Patient Active Problem List   Diagnosis Date Noted   Acute blood loss anemia transfused 2 U PRBCs 02/11/18 02/13/2018   Chronic combined systolic and diastolic HF (heart failure) ([place]) 02/13/2018   Intramuscular hematoma 02/13/2018   Retroperitoneal hemorrhage complicating cardiac catheterization 02/11/2018   CAD in native artery 12/04/2015   Cardiomyopathy, ischemic 12/04/2015   CAD (coronary artery disease), native coronary artery 12/04/2015   History of pulmonary embolism 11/25/2015   Lactic acidosis 11/25/2015   AKI (acute kidney injury) ([place]) 11/25/2015   Aspiration pneumonia ([place]) 11/25/2015   Hyperglycemia 11/25/2015   Normocytic anemia 11/25/2015   Acute respiratory failure with hypoxia [facility])    Cardiogenic shock (HCC)    NSTEMI (non-ST elevated myocardial infarction) ([place])    Cardiac arrest ([place]) 11/19/2015   Angina pectoris ([place]) 08/28/2015   Retinal detachment 05/07/2014   OA (osteoarthritis) of knee 01/08/2014   Preoperative respiratory examination 11/22/2013   Pulmonary embolism ([place]) 04/18/2013  Chronic back pain 04/18/2013   Dyspnea on exertion 04/06/2013   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 Chronic back pain   Basal cell carcinoma of right shoulder    "burned off"   CAD in native artery    a. V fib arrest 2017 s/p DES to LCx with  occluded dCx. b. repeat cath 02/2018 treated medically (see report) - c/b RPH.   Chronic combined systolic and diastolic HF (heart failure) (HCC) 02/13/2018   Chronic SI joint pain    DVT (deep venous thrombosis) ([place]) 04/2013   left lower leg, resulted in Pulmonary emboli on hormone therapy   Dyspnea on exertion    GERD (gastroesophageal reflux disease)    Hypercholesterolemia    Hypothyroidism    Intramuscular hematoma 02/13/2018   Migraine    "< once/month" (12/04/2015)   MVP (mitral valve prolapse)    asymptomatic   Myocardial infarction ([place]) 11/19/2015   cardiac arrest   PE (pulmonary embolism) Apr 18, 2013   tx. -using Xarelto now, no further lung problems, denies SOB on 01-02-14   PONV (postoperative nausea and vomiting)    Retroperitoneal hematoma    RLS (restless legs syndrome)     Family History  Problem Relation Age of Onset   Hypertension Mother    Congestive Heart Failure Mother    Coronary artery disease Father    Hypertension Father    Stroke Brother        DIED AT 31   Cancer Maternal Aunt        lung, colon   Breast cancer Maternal Aunt    Breast cancer Paternal Aunt     Past Surgical History:  Procedure Laterality Date   ANTERIOR CRUCIATE LIGAMENT REPAIR Left 1976; 1981   "open"   CARDIAC CATHETERIZATION N/A 11/20/2015   Procedure: Left Heart Cath and Coronary Angiography;  Surgeon: Belva Crome, MD;  Location: [place] [facility];  Service: Cardiovascular;  Laterality: N/A;   CARDIAC CATHETERIZATION N/A 12/04/2015   Procedure: Left Heart Cath and Coronary Angiography;  Surgeon: Peter M Martinique, MD;  Location: [place] [facility];  Service: Cardiovascular;  Laterality: N/A;   CARDIAC CATHETERIZATION  12/04/2015   Procedure: Coronary Stent Intervention;  Surgeon: Peter M Martinique, MD;  Location: [place] [facility];  Service: Cardiovascular;;   COLONOSCOPY  09/28/2011   Procedure: COLONOSCOPY;  Surgeon: Juanita Craver, MD;  [facility];  Service: Endoscopy;   Laterality: N/A;   COLONOSCOPY WITH PROPOFOL N/A 01/05/2017   Procedure: COLONOSCOPY WITH PROPOFOL;  Surgeon: Juanita Craver, MD;  [facility];  Service: Endoscopy;  Laterality: N/A;   INTRAVASCULAR PRESSURE WIRE/FFR STUDY N/A 02/11/2018   Procedure: INTRAVASCULAR PRESSURE WIRE/FFR STUDY;  Surgeon: Sherren Mocha, MD;  Location: [place] [facility];  Service: Cardiovascular;  Laterality: N/A;   JOINT REPLACEMENT     LEFT HEART CATH AND CORONARY ANGIOGRAPHY N/A 10/08/2016   Procedure: Left Heart Cath and Coronary Angiography;  Surgeon: Peter M Martinique, MD;  Location: [place] [facility];  Service: Cardiovascular;  Laterality: N/A;   LEFT HEART CATH AND CORONARY ANGIOGRAPHY N/A 02/11/2018   Procedure: LEFT HEART CATH AND CORONARY ANGIOGRAPHY;  Surgeon: Sherren Mocha, MD;  Location: [place] [facility];  Service: Cardiovascular;  Laterality: N/A;   TOTAL ABDOMINAL HYSTERECTOMY  1998   TOTAL KNEE ARTHROPLASTY Left 01/08/2014  Procedure: LEFT TOTAL KNEE ARTHROPLASTY;  Surgeon: Gearlean Alf, MD;  [facility];  Service: Orthopedics;  Laterality: Left;   Social History   Occupational History   Not on file  Tobacco Use   Smoking status: Never   Smokeless tobacco: Never  Vaping Use   Vaping Use: Never used  Substance and Sexual Activity   Alcohol use: No   Drug use: No   Sexual activity: Never    Birth control/protection: Surgical    Comment: HYSTERECTOMY

## 2021-05-14 ENCOUNTER — Telehealth: Payer: Self-pay | Admitting: Cardiovascular Disease

## 2021-05-14 NOTE — Telephone Encounter (Signed)
Reaching out to see if pt is needing to continue on Plavix.. please advise

## 2021-05-14 NOTE — Telephone Encounter (Signed)
Tired to call [facility], but they are closed. Will try again later.

## 2021-05-16 NOTE — Telephone Encounter (Signed)
Jiles Garter with [facility] is following up. Please return call when able.

## 2021-05-16 NOTE — Telephone Encounter (Signed)
Informed Jiles Garter with [facility] that patient is to continue her Plavix due to her having CAD. Asked why they were asking about the Plavix. She stated that Dr. Sabra Heck wanted to know. Jiles Garter did not give any indication that patient would need to stop Plavix for any reason. She will call back if there is any reason for patient to stop Plavix.

## 2021-12-17 ENCOUNTER — Telehealth: Payer: Self-pay

## 2021-12-17 ENCOUNTER — Telehealth: Payer: Self-pay | Admitting: *Deleted

## 2021-12-17 NOTE — Telephone Encounter (Signed)
  Patient Consent for Virtual Visit         Brittany Mason has provided verbal consent on 12/17/2021 for a virtual visit (video or telephone).   CONSENT FOR VIRTUAL VISIT FOR:  Brittany Mason  By participating in this virtual visit I agree to the following:  I hereby voluntarily request, consent and authorize [facility] and its employed or contracted physicians, physician assistants, nurse practitioners or other licensed health care professionals (the Practitioner), to provide me with telemedicine health care services (the "Services") as deemed necessary by the treating Practitioner. I acknowledge and consent to receive the Services by the Practitioner via telemedicine. I understand that the telemedicine visit will involve communicating with the Practitioner through live audiovisual communication technology and the disclosure of certain medical information by electronic transmission. I acknowledge that I have been given the opportunity to request an in-person assessment or other available alternative prior to the telemedicine visit and am voluntarily participating in the telemedicine visit.  I understand that I have the right to withhold or withdraw my consent to the use of telemedicine in the course of my care at any time, without affecting my right to future care or treatment, and that the Practitioner or I may terminate the telemedicine visit at any time. I understand that I have the right to inspect all information obtained and/or recorded in the course of the telemedicine visit and may receive copies of available information for a reasonable fee.  I understand that some of the potential risks of receiving the Services via telemedicine include:  Delay or interruption in medical evaluation due to technological equipment failure or disruption; Information transmitted may not be sufficient (e.g. poor resolution of images) to allow for appropriate medical decision making by the Practitioner;  and/or  In rare instances, security protocols could fail, causing a breach of personal health information.  Furthermore, I acknowledge that it is my responsibility to provide information about my medical history, conditions and care that is complete and accurate to the best of my ability. I acknowledge that Practitioner's advice, recommendations, and/or decision may be based on factors not within their control, such as incomplete or inaccurate data provided by me or distortions of diagnostic images or specimens that may result from electronic transmissions. I understand that the practice of medicine is not an exact science and that Practitioner makes no warranties or guarantees regarding treatment outcomes. I acknowledge that a copy of this consent can be made available to me via my patient portal ([facility]), or I can request a printed copy by calling the office of [facility].    I understand that my insurance will be billed for this visit.   I have read or had this consent read to me. I understand the contents of this consent, which adequately explains the benefits and risks of the Services being provided via telemedicine.  I have been provided ample opportunity to ask questions regarding this consent and the Services and have had my questions answered to my satisfaction. I give my informed consent for the services to be provided through the use of telemedicine in my medical care

## 2021-12-17 NOTE — Telephone Encounter (Signed)
Primary Cardiologist:Peter Johnsie Cancel, MD   Preoperative team, please contact this patient and set up a phone call appointment for further preoperative risk assessment. Please obtain consent and complete medication review. Thank you for your help.   I confirm that guidance regarding antiplatelet and oral anticoagulation therapy has been completed and, if necessary, noted below.  She may hold Plavix for 5 days if no recurrence of coronary intervention and angina is stable.   Brittany Life, NP-C    12/17/2021, 11:07 AM [place] [address] Office [phone] Fax [phone]

## 2021-12-17 NOTE — Telephone Encounter (Signed)
Patient scheduled for telehealth visit for preop clearance on 01/20/22. Med rec and consent done.

## 2021-12-17 NOTE — Telephone Encounter (Signed)
   Pre-operative Risk Assessment    Patient Name: Brittany Mason  DOB: 08-Feb-1951 MRN: [id number]      Request for Surgical Clearance    Procedure:   COLONOSCOPY  Date of Surgery:  Clearance 03/06/22                                 Surgeon:  DR. MANN Surgeon's Group or Practice Name:  [facility] Phone number:  [phone] Fax number:  [phone]   Type of Clearance Requested:   - Medical  - Pharmacy:  Hold Clopidogrel (Plavix) NOT INDICATED   Type of Anesthesia:   PROPOFOL   Additional requests/questions:    Astrid Divine   12/17/2021, 7:29 AM

## 2022-01-20 ENCOUNTER — Ambulatory Visit (INDEPENDENT_AMBULATORY_CARE_PROVIDER_SITE_OTHER): Payer: Medicare Other | Admitting: Nurse Practitioner

## 2022-01-20 DIAGNOSIS — Z01810 Encounter for preprocedural cardiovascular examination: Secondary | ICD-10-CM

## 2022-01-20 NOTE — Progress Notes (Signed)
Virtual Visit via Telephone Note   Because of Brittany Mason's co-morbid illnesses, she is at least at moderate risk for complications without adequate follow up.  This format is felt to be most appropriate for this patient at this time.  The patient did not have access to video technology/had technical difficulties with video requiring transitioning to audio format only (telephone).  All issues noted in this document were discussed and addressed.  No physical exam could be performed with this format.  Please refer to the patient's chart for her consent to telehealth for [facility].  Evaluation Performed:  Preoperative cardiovascular risk assessment _____________   Date:  01/20/2022   Patient ID:  Brittany Mason, DOB 1950/08/01, MRN [id number] Patient Location:  Home Provider location:   Office  Primary Care Provider:  Kathyrn Lass, MD Primary Cardiologist:  Jenkins Rouge, MD  Chief Complaint / Patient Profile   71 y.o. 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 Collene Mares of [facility] and presents today for telephonic preoperative cardiovascular risk assessment.  Past Medical History    Past Medical History:  Diagnosis Date   Acute pulmonary embolism ([place]) 06/20/2015   Arthritis    osteoarthritis-knees. Chronic back pain   Basal cell carcinoma of right shoulder    "burned off"   CAD in native artery    a. V fib arrest 2017 s/p DES to LCx with occluded dCx. b. repeat cath 02/2018 treated medically (see report) - c/b RPH.   Chronic combined systolic and diastolic HF (heart failure) (HCC) 02/13/2018   Chronic SI joint pain    DVT (deep venous thrombosis) ([place]) 04/2013   left lower leg, resulted in Pulmonary emboli on hormone  therapy   Dyspnea on exertion    GERD (gastroesophageal reflux disease)    Hypercholesterolemia    Hypothyroidism    Intramuscular hematoma 02/13/2018   Migraine    "< once/month" (12/04/2015)   MVP (mitral valve prolapse)    asymptomatic   Myocardial infarction ([place]) 11/19/2015   cardiac arrest   PE (pulmonary embolism) Apr 18, 2013   tx. -using Xarelto now, no further lung problems, denies SOB on 01-02-14   PONV (postoperative nausea and vomiting)    Retroperitoneal hematoma    RLS (restless legs syndrome)    Past Surgical History:  Procedure Laterality Date   ANTERIOR CRUCIATE LIGAMENT REPAIR Left 1976; 1981   "open"   CARDIAC CATHETERIZATION N/A 11/20/2015   Procedure: Left Heart Cath and Coronary Angiography;  Surgeon: Belva Crome, MD;  Location: [place] [facility];  Service: Cardiovascular;  Laterality: N/A;   CARDIAC CATHETERIZATION N/A 12/04/2015   Procedure: Left Heart Cath and Coronary Angiography;  Surgeon: Peter M Martinique, MD;  Location: [place] [facility];  Service: Cardiovascular;  Laterality: N/A;   CARDIAC CATHETERIZATION  12/04/2015   Procedure: Coronary Stent Intervention;  Surgeon: Peter M Martinique, MD;  Location: [place] [facility];  Service: Cardiovascular;;   COLONOSCOPY  09/28/2011   Procedure: COLONOSCOPY;  Surgeon: Juanita Craver, MD;  [facility];  Service: Endoscopy;  Laterality: N/A;   COLONOSCOPY WITH PROPOFOL N/A 01/05/2017   Procedure: COLONOSCOPY WITH PROPOFOL;  Surgeon: Juanita Craver, MD;  [facility];  Service: Endoscopy;  Laterality: N/A;   INTRAVASCULAR PRESSURE WIRE/FFR STUDY N/A 02/11/2018  Procedure: INTRAVASCULAR PRESSURE WIRE/FFR STUDY;  Surgeon: Sherren Mocha, MD;  Location: [place] [facility];  Service: Cardiovascular;  Laterality: N/A;   JOINT REPLACEMENT     LEFT HEART CATH AND CORONARY ANGIOGRAPHY N/A 10/08/2016   Procedure: Left Heart Cath and Coronary Angiography;  Surgeon: Peter M Martinique, MD;  Location: [place] [facility];   Service: Cardiovascular;  Laterality: N/A;   LEFT HEART CATH AND CORONARY ANGIOGRAPHY N/A 02/11/2018   Procedure: LEFT HEART CATH AND CORONARY ANGIOGRAPHY;  Surgeon: Sherren Mocha, MD;  Location: [place] [facility];  Service: Cardiovascular;  Laterality: N/A;   TOTAL ABDOMINAL HYSTERECTOMY  1998   TOTAL KNEE ARTHROPLASTY Left 01/08/2014   Procedure: LEFT TOTAL KNEE ARTHROPLASTY;  Surgeon: Gearlean Alf, MD;  [facility];  Service: Orthopedics;  Laterality: Left;    Allergies  Allergies  Allergen Reactions   Atorvastatin Other (See Comments)   Codeine Nausea And Vomiting and Other (See Comments)    Takes hydrocodone; if takes doses too close together, states "violently throws up"   Erythromycin Hives and Itching    History of Present Illness    [name] is a 71 y.o. female who presents via audio/video conferencing for a telehealth visit today.  Pt was last seen in cardiology clinic on 03/12/2021 by Dr. Johnsie Cancel.  At that time Brittany Mason was doing well.  The patient is now pending procedure as outlined above. Since her last visit, she stable overall from a cardiac standpoint.  She did note one brief episode of chest pressure in June 2023, however, she denies any exertional symptoms concerning for angina. She denies chest pain, palpitations, dyspnea, pnd, orthopnea, n, v, dizziness, syncope, edema, weight gain, or early satiety. All other systems reviewed and are otherwise negative except as noted above.   Home Medications    Prior to Admission medications   Medication Sig Start Date End Date Taking? Authorizing Provider  aspirin EC 81 MG tablet Take 81 mg by mouth daily with lunch.    [provider]  butalbital-acetaminophen-caffeine (FIORICET, ESGIC) 50-325-40 MG tablet Take 1 tablet by mouth 2 (two) times daily as needed for headache.    [provider]  clopidogrel (PLAVIX) 75 MG tablet TAKE 1 TABLET(75 MG) BY MOUTH DAILY 03/12/21   Josue Hector, MD   cyclobenzaprine (FLEXERIL) 5 MG tablet Take 5 mg by mouth 3 (three) times daily as needed for muscle spasms.    [provider]  ezetimibe (ZETIA) 10 MG tablet Take 1 tablet (10 mg total) by mouth daily. 03/12/21   Josue Hector, MD  fenofibrate 160 MG tablet Take 160 mg by mouth at bedtime.     [provider]  FLUoxetine (PROZAC) 20 MG tablet Take 20 mg by mouth daily.    [provider]  isosorbide mononitrate (IMDUR) 30 MG 24 hr tablet Take 0.5 tablets (15 mg total) by mouth daily. 03/12/21   Josue Hector, MD  levothyroxine (SYNTHROID) 125 MCG tablet Take 1 tablet by mouth daily. 06/30/19   [provider]  meclizine (ANTIVERT) 25 MG tablet Take 25 mg by mouth 3 (three) times daily as needed for dizziness.    [provider]  metoprolol tartrate (LOPRESSOR) 25 MG tablet TAKE 1/2 TABLET(12.5 MG) BY MOUTH TWICE DAILY 03/12/21   Josue Hector, MD  morphine (MSIR) 30 MG tablet Take 30 mg by mouth daily.    [provider]  nitroGLYCERIN (NITROSTAT) 0.4 MG SL tablet PLACE 1  TABLET UNDER THE TONGUE EVERY 5 MINUTES AS NEEDED FOR CHEST PAIN 03/12/21   Josue Hector, MD  omega-3 acid ethyl esters (LOVAZA) 1 G capsule Take 2 g by mouth 2 (two) times daily.    [provider]  ondansetron (ZOFRAN) 4 MG tablet Take 4 mg by mouth every 8 (eight) hours as needed for nausea or vomiting.    [provider]  OVER THE COUNTER MEDICATION Take 2 capsules by mouth daily at 6 PM. "Relizen" herbal supplement for hormone replacement    [provider]  Polyethyl Glycol-Propyl Glycol (SYSTANE OP) Place 1-2 drops into both eyes daily as needed (for red/itchy eyes).     [provider]  rOPINIRole (REQUIP) 0.5 MG tablet Take 0.5 mg by mouth at bedtime.     [provider]  scopolamine (TRANSDERM-SCOP) 1 MG/3DAYS Place 1 patch onto the skin as needed (for travel).    [provider]  simvastatin (ZOCOR) 40 MG  tablet TAKE 1 TABLET BY MOUTH EVERY DAY AT 6 PM 03/12/21   Josue Hector, MD    Physical Exam    Vital Signs:  ZANYLAH HARDIE does not have vital signs available for review today.  Given telephonic nature of communication, physical exam is limited. AAOx3. NAD. Normal affect.  Speech and respirations are unlabored.  Accessory Clinical Findings    None  Assessment & Plan    1.  Preoperative Cardiovascular Risk Assessment:  According to the Revised Cardiac Risk Index (RCRI), her Perioperative Risk of Major Cardiac Event is (%): 0.9. Her Functional Capacity in METs is: 6.45 according to the Duke Activity Status Index (DASI). Therefore, based on ACC/AHA guidelines, patient would be at acceptable risk for the planned procedure without further cardiovascular testing.  Per office protocol, patient may hold Plavix for 5 days prior to procedure.  Please resume Plavix as soon as possible postprocedure, at the discretion of the surgeon.  A copy of this note will be routed to requesting surgeon.  Time:   Today, I have spent 5 minutes with the patient with telehealth technology discussing medical history, symptoms, and management plan.     Lenna Sciara, NP  01/20/2022, 2:31 PM

## 2022-01-30 ENCOUNTER — Other Ambulatory Visit: Payer: Self-pay | Admitting: Gastroenterology

## 2022-02-20 ENCOUNTER — Other Ambulatory Visit: Payer: Self-pay | Admitting: Cardiovascular Disease

## 2022-02-23 ENCOUNTER — Other Ambulatory Visit: Payer: Self-pay | Admitting: Cardiovascular Disease

## 2022-02-26 ENCOUNTER — Ambulatory Visit (INDEPENDENT_AMBULATORY_CARE_PROVIDER_SITE_OTHER): Payer: Medicare Other | Admitting: Orthopedic Surgery

## 2022-02-26 ENCOUNTER — Ambulatory Visit: Payer: Self-pay

## 2022-02-26 DIAGNOSIS — R200 Anesthesia of skin: Secondary | ICD-10-CM

## 2022-02-27 ENCOUNTER — Encounter (HOSPITAL_COMMUNITY): Payer: Self-pay | Admitting: Gastroenterology

## 2022-02-28 ENCOUNTER — Encounter: Payer: Self-pay | Admitting: Orthopedic Surgery
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
## 2022-03-03 ENCOUNTER — Encounter (HOSPITAL_COMMUNITY): Payer: Self-pay | Admitting: Gastroenterology

## 2022-03-03 ENCOUNTER — Telehealth: Payer: Self-pay | Admitting: Cardiovascular Disease

## 2022-03-03 ENCOUNTER — Ambulatory Visit (INDEPENDENT_AMBULATORY_CARE_PROVIDER_SITE_OTHER): Payer: Medicare Other | Admitting: Physical Medicine and Rehabilitation

## 2022-03-03 DIAGNOSIS — R202 Paresthesia of skin: Secondary | ICD-10-CM | POA: Diagnosis not present

## 2022-03-03 DIAGNOSIS — R209 Unspecified disturbances of skin sensation: Secondary | ICD-10-CM

## 2022-03-03 NOTE — Progress Notes (Signed)
Numeric Pain Rating Scale and Functional Assessment Average Pain 0   In the last MONTH (on 0-10 scale) has pain interfered with the following?  1. General activity like being  able to carry out your everyday physical activities such as walking, climbing stairs, carrying groceries, or moving a chair?  Rating(0) Numbness of left index finger for just over a week.  Numbness and tingling. No pain

## 2022-03-03 NOTE — Telephone Encounter (Signed)
Patient has a colonoscopy scheduled for this Friday, 9/29. Performing office is not requesting clearance, but patient has been experiencing numbness/tinging in left index finger--at finger tip only. Saw a nerve specialist today, but nothing was found. Would like to know what Dr. Johnsie Cancel recommends. Patient worries about proceeding with colonoscopy. Please advise.

## 2022-03-03 NOTE — Telephone Encounter (Signed)
Called patient back about her message. Patient stated that she has had numbness and tingling in her left tip of the index finger that has been going on for over a week, started on 02/21/22. Patient stated she read somewhere that this symptom could be a sign of carotid artery disease. Patient stated she saw ortho on 02/26/22 and had nerve study done today. So far all test have come back normal. Patient is concerned since she is have colonoscopy this week. Will send message to Dr. Johnsie Cancel for his advisement.

## 2022-03-03 NOTE — Progress Notes (Signed)
Brittany Mason - 70 y.o. female MRN [id number]  Date of birth: [birth year]  Office Visit Note: Visit Date: 03/03/2022 PCP: Kathyrn Lass, MD Referred by: Meredith Pel, MD  Subjective: Chief Complaint  Patient presents with   Left Hand - Numbness    '@nd'$  digit numbness   HPI:  Brittany Mason is a 71 y.o. female who comes in today at the request of Dr. Anderson Malta for electrodiagnostic study of the Left upper extremities.  Patient is Right hand dominant.  She reports a few weeks of numbness in the distal tip of the left index finger more on the medial side.  She denies any numbness proximal to that and any numbness in any other fingers.  She has no numbness or tingling on the right hand.  No frank radicular symptoms and no prior electrodiagnostic studies.  Please see Dr. Randel Pigg notes for further details and justification.  Typically for carpal tunnel screening we used the middle digit which we will use today but I will go ahead and complete the test over the index finger as well in case that shows any difference between the 2.  Unfortunately we do not have a good electrodiagnostic study for the digital nerves within the digits themselves.    ROS Otherwise per HPI.  Assessment & Plan: Visit Diagnoses:    ICD-10-CM   1. Paresthesia of skin  R20.2 NCV with EMG (electromyography)      Plan: Impression: Essentially NORMAL electrodiagnostic study of the left upper limb.  There is no significant electrodiagnostic evidence of nerve entrapment, brachial plexopathy or cervical radiculopathy.  The study labeled right median antisensory nerve was in fact the left conduction using the index finger.  There was no way to do the study with a label of the left median nerve using the index finger.  As you know, purely sensory or demyelinating radiculopathies and chemical radiculitis may not be detected with this particular electrodiagnostic study.  Recommendations: 1.  Follow-up with referring  physician. 2.  Continue current management of symptoms.  Meds & Orders: No orders of the defined types were placed in this encounter.   Orders Placed This Encounter  Procedures   NCV with EMG (electromyography)    Follow-up: Return in about 2 weeks (around 03/17/2022) for  G. Alphonzo Severance, MD.   Procedures: No procedures performed  EMG & NCV Findings: Evaluation of the right median sensory nerve showed no response (Elbow).  All remaining nerves (as indicated in the following tables) were within normal limits.    All examined muscles (as indicated in the following table) showed no evidence of electrical instability.    Impression: Essentially NORMAL electrodiagnostic study of the left upper limb.  There is no significant electrodiagnostic evidence of nerve entrapment, brachial plexopathy or cervical radiculopathy.    As you know, purely sensory or demyelinating radiculopathies and chemical radiculitis may not be detected with this particular electrodiagnostic study.  Recommendations: 1.  Follow-up with referring physician. 2.  Continue current management of symptoms.  ___________________________ Laurence Spates FAAPMR Board Certified, American Board of Physical Medicine and Rehabilitation    Nerve Conduction Studies Anti Sensory Summary Table   Stim Site NR Peak (ms) Norm Peak (ms) P-T Amp (V) Norm P-T Amp Site1 Site2 Delta-P (ms) Dist (cm) Vel (m/s) Norm Vel (m/s)  Right Median Anti Sensory (index 17.5)  31.8C  Wrist    3.5 <3.6 31.6 >10 Wrist index 17.5 3.5 14.0 40 >39  Elbow *NR  Elbow Wrist  17.5  >48  Left Median Acr Palm Anti Sensory (2nd Digit)  31.8C  Wrist    3.4 <3.6 23.8 >10 Wrist Palm 1.5 0.0    Palm    1.9 <2.0 2.5             11.1  4.1         Left Radial Anti Sensory (Base 1st Digit)  31.2C  Wrist    2.1 <3.1 39.0  Wrist Base 1st Digit 2.1 0.0    Left Ulnar Anti Sensory (5th Digit)  31.4C  Wrist    3.3 <3.7 21.2 >15.0 Wrist 5th Digit 3.3 14.0 42 >38    Motor Summary Table   Stim Site NR Onset (ms) Norm Onset (ms) O-P Amp (mV) Norm O-P Amp Site1 Site2 Delta-0 (ms) Dist (cm) Vel (m/s) Norm Vel (m/s)  Left Median Motor (Abd Poll Brev)  31.8C  Wrist    3.9 <4.2 6.5 >5 Elbow Wrist 3.8 20.0 53 >50  Elbow    7.7  2.6         Left Ulnar Motor (Abd Dig Min)  31.7C  Wrist    3.0 <4.2 9.9 >3 B Elbow Wrist 3.0 19.0 63 >53  B Elbow    6.0  8.9  A Elbow B Elbow 0.9 10.0 111 >53  A Elbow    6.9  8.7          EMG   Side Muscle Nerve Root Ins Act Fibs Psw Amp Dur Poly Recrt Int Fraser Din Comment  Left 1stDorInt Ulnar C8-T1 Nml Nml Nml Nml Nml 0 Nml Nml   Left Abd Poll Brev Median C8-T1 Nml Nml Nml Nml Nml 0 Nml Nml   Left ExtDigCom   Nml Nml Nml Nml Nml 0 Nml Nml   Left Triceps Radial C6-7-8 Nml Nml Nml Nml Nml 0 Nml Nml   Left Deltoid Axillary C5-6 Nml Nml Nml Nml Nml 0 Nml Nml     Nerve Conduction Studies Anti Sensory Left/Right Comparison   Stim Site L Lat (ms) R Lat (ms) L-R Lat (ms) L Amp (V) R Amp (V) L-R Amp (%) Site1 Site2 L Vel (m/s) R Vel (m/s) L-R Vel (m/s)  Median Anti Sensory (index 17.5)  31.8C  Wrist  3.5   31.6  Wrist index 17.5  40   Elbow       Elbow Wrist     Median Acr Palm Anti Sensory (2nd Digit)  31.8C  Wrist 3.4   23.8   Wrist Palm     Palm 1.9   2.5          11.1   4.1         Radial Anti Sensory (Base 1st Digit)  31.2C  Wrist 2.1   39.0   Wrist Base 1st Digit     Ulnar Anti Sensory (5th Digit)  31.4C  Wrist 3.3   21.2   Wrist 5th Digit 42     Motor Left/Right Comparison   Stim Site L Lat (ms) R Lat (ms) L-R Lat (ms) L Amp (mV) R Amp (mV) L-R Amp (%) Site1 Site2 L Vel (m/s) R Vel (m/s) L-R Vel (m/s)  Median Motor (Abd Poll Brev)  31.8C  Wrist 3.9   6.5   Elbow Wrist 53    Elbow 7.7   2.6         Ulnar Motor (Abd Dig Min)  31.7C  Wrist 3.0   9.9   B Elbow  Wrist 63    B Elbow 6.0   8.9   A Elbow B Elbow 111    A Elbow 6.9   8.7            Waveforms:              Clinical History: No specialty  comments available.     Objective:  VS:  HT:    WT:   BMI:     BP:   HR: bpm  TEMP: ( )  RESP:  Physical Exam Musculoskeletal:        General: No swelling, tenderness or deformity.     Comments: Inspection reveals no atrophy of the bilateral APB or FDI or hand intrinsics. There is no swelling, color changes, allodynia or dystrophic changes. There is 5 out of 5 strength in the bilateral wrist extension, finger abduction and long finger flexion. There is intact sensation to light touch in all dermatomal and peripheral nerve distributions except the medial side of the distal index finger on the left.. There is a negative Hoffmann's test bilaterally.  Skin:    General: Skin is warm and dry.     Findings: No erythema or rash.  Neurological:     General: No focal deficit present.     Mental Status: She is alert and oriented to person, place, and time.     Motor: No weakness or abnormal muscle tone.     Coordination: Coordination normal.  Psychiatric:        Mood and Affect: Mood normal.        Behavior: Behavior normal.      Imaging: No results found.

## 2022-03-04 NOTE — Procedures (Signed)
EMG & NCV Findings:  All examined nerves (as indicated in the following tables) were within normal limits.    All examined muscles (as indicated in the following table) showed no evidence of electrical instability.    Impression: Essentially NORMAL electrodiagnostic study of the left upper limb.  There is no significant electrodiagnostic evidence of nerve entrapment, brachial plexopathy or cervical radiculopathy.  The study labeled right median antisensory nerve was in fact the left conduction using the index finger.  There was no way to do the study with a label of the left median nerve using the index finger.  As you know, purely sensory or demyelinating radiculopathies and chemical radiculitis may not be detected with this particular electrodiagnostic study.  Recommendations: 1.  Follow-up with referring physician. 2.  Continue current management of symptoms.  ___________________________ Laurence Spates FAAPMR Board Certified, American Board of Physical Medicine and Rehabilitation    Nerve Conduction Studies Anti Sensory Summary Table   Stim Site NR Peak (ms) Norm Peak (ms) P-T Amp (V) Norm P-T Amp Site1 Site2 Delta-P (ms) Dist (cm) Vel (m/s) Norm Vel (m/s)  Right Median Anti Sensory (index 17.5)  31.8C  Wrist    3.5 <3.6 31.6 >10 Wrist index 17.5 3.5 14.0 40 >39  Elbow *NR     Elbow Wrist  17.5  >48  Left Median Acr Palm Anti Sensory (2nd Digit)  31.8C  Wrist    3.4 <3.6 23.8 >10 Wrist Palm 1.5 0.0    Palm    1.9 <2.0 2.5             11.1  4.1         Left Radial Anti Sensory (Base 1st Digit)  31.2C  Wrist    2.1 <3.1 39.0  Wrist Base 1st Digit 2.1 0.0    Left Ulnar Anti Sensory (5th Digit)  31.4C  Wrist    3.3 <3.7 21.2 >15.0 Wrist 5th Digit 3.3 14.0 42 >38   Motor Summary Table   Stim Site NR Onset (ms) Norm Onset (ms) O-P Amp (mV) Norm O-P Amp Site1 Site2 Delta-0 (ms) Dist (cm) Vel (m/s) Norm Vel (m/s)  Left Median Motor (Abd Poll Brev)  31.8C  Wrist    3.9 <4.2 6.5  >5 Elbow Wrist 3.8 20.0 53 >50  Elbow    7.7  2.6         Left Ulnar Motor (Abd Dig Min)  31.7C  Wrist    3.0 <4.2 9.9 >3 B Elbow Wrist 3.0 19.0 63 >53  B Elbow    6.0  8.9  A Elbow B Elbow 0.9 10.0 111 >53  A Elbow    6.9  8.7          EMG   Side Muscle Nerve Root Ins Act Fibs Psw Amp Dur Poly Recrt Int Fraser Din Comment  Left 1stDorInt Ulnar C8-T1 Nml Nml Nml Nml Nml 0 Nml Nml   Left Abd Poll Brev Median C8-T1 Nml Nml Nml Nml Nml 0 Nml Nml   Left ExtDigCom   Nml Nml Nml Nml Nml 0 Nml Nml   Left Triceps Radial C6-7-8 Nml Nml Nml Nml Nml 0 Nml Nml   Left Deltoid Axillary C5-6 Nml Nml Nml Nml Nml 0 Nml Nml     Nerve Conduction Studies Anti Sensory Left/Right Comparison   Stim Site L Lat (ms) R Lat (ms) L-R Lat (ms) L Amp (V) R Amp (V) L-R Amp (%) Site1 Site2 L Vel (m/s) R Vel (m/s) L-R  Vel (m/s)  Median Anti Sensory (index 17.5)  31.8C  Wrist  3.5   31.6  Wrist index 17.5  40   Elbow       Elbow Wrist     Median Acr Palm Anti Sensory (2nd Digit)  31.8C  Wrist 3.4   23.8   Wrist Palm     Palm 1.9   2.5          11.1   4.1         Radial Anti Sensory (Base 1st Digit)  31.2C  Wrist 2.1   39.0   Wrist Base 1st Digit     Ulnar Anti Sensory (5th Digit)  31.4C  Wrist 3.3   21.2   Wrist 5th Digit 42     Motor Left/Right Comparison   Stim Site L Lat (ms) R Lat (ms) L-R Lat (ms) L Amp (mV) R Amp (mV) L-R Amp (%) Site1 Site2 L Vel (m/s) R Vel (m/s) L-R Vel (m/s)  Median Motor (Abd Poll Brev)  31.8C  Wrist 3.9   6.5   Elbow Wrist 53    Elbow 7.7   2.6         Ulnar Motor (Abd Dig Min)  31.7C  Wrist 3.0   9.9   B Elbow Wrist 63    B Elbow 6.0   8.9   A Elbow B Elbow 111    A Elbow 6.9   8.7            Waveforms:

## 2022-03-05 ENCOUNTER — Other Ambulatory Visit: Payer: Self-pay | Admitting: Cardiovascular Disease

## 2022-03-05 NOTE — Telephone Encounter (Signed)
Patient calling back.   °

## 2022-03-05 NOTE — Telephone Encounter (Signed)
Patient verbalized understanding and agreement for testing. Made aware scheduling will be calling to set this up at the [place] [facility].

## 2022-03-05 NOTE — Telephone Encounter (Signed)
Brittany Hector, MD     03/04/22  9:00 AM Very doubtful she had a duplex 2019 with just plaque no stenosis Can order carotid duplex

## 2022-03-06 ENCOUNTER — Ambulatory Visit (HOSPITAL_COMMUNITY): Payer: Medicare Other | Admitting: Registered Nurse

## 2022-03-06 ENCOUNTER — Encounter (HOSPITAL_COMMUNITY)
Admission: RE | Disposition: A | Discharge status: Home / Self Care | Payer: Self-pay | Source: Home / Self Care | Attending: Gastroenterology

## 2022-03-06 ENCOUNTER — Ambulatory Visit (HOSPITAL_BASED_OUTPATIENT_CLINIC_OR_DEPARTMENT_OTHER): Payer: Medicare Other | Admitting: Registered Nurse

## 2022-03-06 ENCOUNTER — Encounter (HOSPITAL_COMMUNITY): Payer: Self-pay | Admitting: Gastroenterology

## 2022-03-06 ENCOUNTER — Other Ambulatory Visit: Payer: Self-pay

## 2022-03-06 ENCOUNTER — Ambulatory Visit (HOSPITAL_COMMUNITY)
Admission: RE | Admit: 2022-03-06 | Discharge: 2022-03-06 | Disposition: A | Discharge status: Home / Self Care | Payer: Medicare Other | Attending: Gastroenterology | Admitting: Gastroenterology

## 2022-03-06 DIAGNOSIS — I2510 Atherosclerotic heart disease of native coronary artery without angina pectoris: Secondary | ICD-10-CM | POA: Insufficient documentation

## 2022-03-06 DIAGNOSIS — K219 Gastro-esophageal reflux disease without esophagitis: Secondary | ICD-10-CM | POA: Insufficient documentation

## 2022-03-06 DIAGNOSIS — K5900 Constipation, unspecified: Secondary | ICD-10-CM | POA: Diagnosis not present

## 2022-03-06 DIAGNOSIS — I110 Hypertensive heart disease with heart failure: Secondary | ICD-10-CM | POA: Diagnosis not present

## 2022-03-06 DIAGNOSIS — Z1211 Encounter for screening for malignant neoplasm of colon: Secondary | ICD-10-CM | POA: Insufficient documentation

## 2022-03-06 DIAGNOSIS — K5730 Diverticulosis of large intestine without perforation or abscess without bleeding: Secondary | ICD-10-CM

## 2022-03-06 DIAGNOSIS — I25119 Atherosclerotic heart disease of native coronary artery with unspecified angina pectoris: Secondary | ICD-10-CM | POA: Diagnosis not present

## 2022-03-06 DIAGNOSIS — Z86711 Personal history of pulmonary embolism: Secondary | ICD-10-CM | POA: Insufficient documentation

## 2022-03-06 DIAGNOSIS — D649 Anemia, unspecified: Secondary | ICD-10-CM | POA: Diagnosis not present

## 2022-03-06 DIAGNOSIS — I341 Nonrheumatic mitral (valve) prolapse: Secondary | ICD-10-CM | POA: Insufficient documentation

## 2022-03-06 DIAGNOSIS — R519 Headache, unspecified: Secondary | ICD-10-CM | POA: Insufficient documentation

## 2022-03-06 DIAGNOSIS — I5042 Chronic combined systolic (congestive) and diastolic (congestive) heart failure: Secondary | ICD-10-CM | POA: Diagnosis not present

## 2022-03-06 DIAGNOSIS — I252 Old myocardial infarction: Secondary | ICD-10-CM | POA: Diagnosis not present

## 2022-03-06 DIAGNOSIS — E039 Hypothyroidism, unspecified: Secondary | ICD-10-CM | POA: Insufficient documentation

## 2022-03-06 DIAGNOSIS — M1990 Unspecified osteoarthritis, unspecified site: Secondary | ICD-10-CM | POA: Insufficient documentation

## 2022-03-06 DIAGNOSIS — Z86718 Personal history of other venous thrombosis and embolism: Secondary | ICD-10-CM | POA: Diagnosis not present

## 2022-03-06 DIAGNOSIS — K635 Polyp of colon: Secondary | ICD-10-CM

## 2022-03-06 DIAGNOSIS — D124 Benign neoplasm of descending colon: Secondary | ICD-10-CM | POA: Insufficient documentation

## 2022-03-06 HISTORY — PX: COLONOSCOPY WITH PROPOFOL: SHX5780

## 2022-03-06 HISTORY — PX: POLYPECTOMY: SHX5525

## 2022-03-06 SURGERY — COLONOSCOPY WITH PROPOFOL
Anesthesia: Monitor Anesthesia Care

## 2022-03-06 MED ORDER — LACTATED RINGERS IV SOLN: INTRAVENOUS | Status: DC

## 2022-03-06 MED ORDER — PROPOFOL 1000 MG/100ML IV EMUL
INTRAVENOUS | Status: AC
  Filled 2022-03-06: qty 100

## 2022-03-06 MED ORDER — EPHEDRINE SULFATE-NACL 50-0.9 MG/10ML-% IV SOSY
PREFILLED_SYRINGE | INTRAVENOUS | Status: DC | PRN
  Administered 2022-03-06 (×2): 5 mg via INTRAVENOUS

## 2022-03-06 MED ORDER — ONDANSETRON HCL 4 MG/2ML IJ SOLN
INTRAMUSCULAR | Status: DC | PRN
  Administered 2022-03-06: 4 mg via INTRAVENOUS

## 2022-03-06 MED ORDER — LACTATED RINGERS IV SOLN: INTRAVENOUS | Status: DC | PRN

## 2022-03-06 MED ORDER — PROPOFOL 500 MG/50ML IV EMUL
INTRAVENOUS | Status: DC | PRN
  Administered 2022-03-06: 10 mg via INTRAVENOUS
  Administered 2022-03-06: 150 ug/kg/min via INTRAVENOUS
  Administered 2022-03-06 (×2): 20 mg via INTRAVENOUS

## 2022-03-06 SURGICAL SUPPLY — 22 items

## 2022-03-06 NOTE — Op Note (Signed)
[facility] Patient Name: Brittany Mason Procedure Date: 03/06/2022 MRN: [id number] Attending MD: Carol Ada , MD Date of Birth: Jul 09, 1950 CSN: [id number] Age: 71 Admit Type: Outpatient Procedure:                Colonoscopy Indications:              Screening for colorectal malignant neoplasm Providers:                Carol Ada, MD, Dulcy Fanny, Luan Moore, Technician, Brien Mates, RNFA, Victoriano Lain, CRNA Referring MD:              Medicines:                Propofol per Anesthesia Complications:            No immediate complications. Estimated Blood Loss:     Estimated blood loss: none. Procedure:                Pre-Anesthesia Assessment:                           - Prior to the procedure, a History and Physical                            was performed, and patient medications and                            allergies were reviewed. [name] tolerance of                            previous anesthesia was also reviewed. The risks                            and benefits of the procedure and the sedation                            options and risks were discussed with the patient.                            All questions were answered, and informed consent                            was obtained. Prior Anticoagulants: The patient has                            taken no previous anticoagulant or antiplatelet                            agents. ASA Grade Assessment: III - A patient with                            severe  systemic disease. After reviewing the risks                            and benefits, the patient was deemed in                            satisfactory condition to undergo the procedure.                           - Sedation was administered by an anesthesia                            professional. Deep sedation was attained.                           After obtaining informed  consent, the colonoscope                            was passed under direct vision. Throughout the                            procedure, the patient's blood pressure, pulse, and                            oxygen saturations were monitored continuously. The                            CF-HQ190L ([id number]) Olympus colonoscope was                            introduced through the anus and advanced to the the                            cecum, identified by appendiceal orifice and                            ileocecal valve. The colonoscopy was performed                            without difficulty. The patient tolerated the                            procedure well. The quality of the bowel                            preparation was evaluated using the BBPS [facility]                            Bowel Preparation Scale) with scores of: Right                            Colon = 3 (entire mucosa seen well with no residual  staining, small fragments of stool or opaque                            liquid), Transverse Colon = 3 (entire mucosa seen                            well with no residual staining, small fragments of                            stool or opaque liquid) and Left Colon = 3 (entire                            mucosa seen well with no residual staining, small                            fragments of stool or opaque liquid). The total                            BBPS score equals 9. The quality of the bowel                            preparation was good. The ileocecal valve,                            appendiceal orifice, and rectum were photographed. Scope In: 9:24:14 AM Scope Out: 9:40:01 AM Scope Withdrawal Time: 0 hours 9 minutes 23 seconds  Total Procedure Duration: 0 hours 15 minutes 47 seconds  Findings:      A 3 mm polyp was found in the descending colon. The polyp was sessile.       The polyp was removed with a cold snare. Resection and retrieval were        complete.      Scattered small-mouthed diverticula were found in the sigmoid colon. Impression:               - One 3 mm polyp in the descending colon, removed                            with a cold snare. Resected and retrieved.                           - Diverticulosis in the sigmoid colon. Moderate Sedation:      Not Applicable - Patient had care per Anesthesia. Recommendation:           - Patient has a contact number available for                            emergencies. The signs and symptoms of potential                            delayed complications were discussed with the                            patient. Return to normal activities tomorrow.  Written discharge instructions were provided to the                            patient.                           - Resume previous diet.                           - Continue present medications.                           - Await pathology results.                           - Repeat colonoscopy in 7 years for surveillance. Procedure Code(s):        --- Professional ---                           [id number], Colonoscopy, flexible; with removal of                            tumor(s), polyp(s), or other lesion(s) by snare                            technique Diagnosis Code(s):        --- Professional ---                           K63.5, Polyp of colon                           Z12.11, Encounter for screening for malignant                            neoplasm of colon                           K57.30, Diverticulosis of large intestine without                            perforation or abscess without bleeding CPT copyright 2019 American Medical Association. All rights reserved. The codes documented in this report are preliminary and upon coder review may  be revised to meet current compliance requirements. Carol Ada, MD Carol Ada, MD 03/06/2022 9:47:06 AM This report has been signed electronically. Number of  Addenda: 0

## 2022-03-06 NOTE — H&P (Signed)
Brittany Mason HPI: This 71 year old white female presents to the office for colorectal cancer screening. She has problems with constipation and can go up to 2 days without having a BM. She takes 1 capful of Miralax once a week. She has 4-5 BM's per week. There is no obvious blood or mucus in the stool. She has a good appetite. She has lost 15 pounds over the last 18 months with changes in her diet. She denies having any complaints of abdominal pain, nausea, vomiting, acid reflux, dysphagia or odynophagia. She denies having a family history of celiac sprue or IBD. Her maternal aunt was diagnosed with colon cancer in her 28's. Her last colonoscopy done on 01/05/2017 revealed scattered diverticula, internal hemorrhoids and a tubular adenoma was removed.  Past Medical History:  Diagnosis Date   Acute pulmonary embolism ([place]) 06/20/2015   Arthritis    osteoarthritis-knees. Chronic back pain   Basal cell carcinoma of right shoulder    "burned off"   CAD in native artery    a. V fib arrest 2017 s/p DES to LCx with occluded dCx. b. repeat cath 02/2018 treated medically (see report) - c/b RPH.   Chronic combined systolic and diastolic HF (heart failure) ([facility]) 02/13/2018   Chronic SI joint pain    DVT (deep venous thrombosis) ([place]) 04/2013   left lower leg, resulted in Pulmonary emboli on hormone therapy   Dyspnea on exertion    GERD (gastroesophageal reflux disease)    Hypercholesterolemia    Hypothyroidism    Intramuscular hematoma 02/13/2018   Migraine    "< once/month" (12/04/2015)   MVP (mitral valve prolapse)    asymptomatic   Myocardial infarction ([place]) 11/19/2015   cardiac arrest   PE (pulmonary embolism) 04/18/2013   tx. -using Xarelto now, no further lung problems, denies SOB on 01-02-14   PONV (postoperative nausea and vomiting)    Retroperitoneal hematoma    RLS (restless legs syndrome)     Past Surgical History:  Procedure Laterality Date   ANTERIOR CRUCIATE LIGAMENT REPAIR Left  1976; 1981   "open"   CARDIAC CATHETERIZATION N/A 11/20/2015   Procedure: Left Heart Cath and Coronary Angiography;  Surgeon: Belva Crome, MD;  Location: [place] [facility];  Service: Cardiovascular;  Laterality: N/A;   CARDIAC CATHETERIZATION N/A 12/04/2015   Procedure: Left Heart Cath and Coronary Angiography;  Surgeon: Peter M Martinique, MD;  Location: [place] [facility];  Service: Cardiovascular;  Laterality: N/A;   CARDIAC CATHETERIZATION  12/04/2015   Procedure: Coronary Stent Intervention;  Surgeon: Peter M Martinique, MD;  Location: [place] [facility];  Service: Cardiovascular;;   COLONOSCOPY  09/28/2011   Procedure: COLONOSCOPY;  Surgeon: Juanita Craver, MD;  Location: [facility];  Service: Endoscopy;  Laterality: N/A;   COLONOSCOPY WITH PROPOFOL N/A 01/05/2017   Procedure: COLONOSCOPY WITH PROPOFOL;  Surgeon: Juanita Craver, MD;  Location: [facility];  Service: Endoscopy;  Laterality: N/A;   INTRAVASCULAR PRESSURE WIRE/FFR STUDY N/A 02/11/2018   Procedure: INTRAVASCULAR PRESSURE WIRE/FFR STUDY;  Surgeon: Sherren Mocha, MD;  Location: [place] [facility];  Service: Cardiovascular;  Laterality: N/A;   JOINT REPLACEMENT     LEFT HEART CATH AND CORONARY ANGIOGRAPHY N/A 10/08/2016   Procedure: Left Heart Cath and Coronary Angiography;  Surgeon: Peter M Martinique, MD;  Location: [place] [facility];  Service: Cardiovascular;  Laterality: N/A;   LEFT HEART CATH AND CORONARY ANGIOGRAPHY N/A 02/11/2018   Procedure: LEFT HEART CATH AND CORONARY ANGIOGRAPHY;  Surgeon:  Sherren Mocha, MD;  Location: [place] [facility];  Service: Cardiovascular;  Laterality: N/A;   TOTAL ABDOMINAL HYSTERECTOMY  1998   TOTAL KNEE ARTHROPLASTY Left 01/08/2014   Procedure: LEFT TOTAL KNEE ARTHROPLASTY;  Surgeon: Gearlean Alf, MD;  Location: [facility];  Service: Orthopedics;  Laterality: Left;    Family History  Problem Relation Age of Onset   Hypertension Mother    Congestive Heart Failure Mother    Coronary artery disease Father     Hypertension Father    Stroke Brother        DIED AT 80   Cancer Maternal Aunt        lung, colon   Breast cancer Maternal Aunt    Breast cancer Paternal Aunt     Social History:  reports that she has never smoked. She has never used smokeless tobacco. She reports that she does not drink alcohol and does not use drugs.  Allergies:  Allergies  Allergen Reactions   Atorvastatin Other (See Comments)    Muscle pain   Codeine Nausea And Vomiting and Other (See Comments)    Takes hydrocodone; if takes doses too close together, states "violently throws up"   Erythromycin Hives and Itching    Medications: Scheduled: Continuous:  lactated ringers 20 mL/hr at 03/06/22 0917    No results found for this or any previous visit (from the past 24 hour(s)).   No results found.  ROS:  As stated above in the HPI otherwise negative.  Blood pressure 130/89, pulse 69, temperature 97.7 F (36.5 C), temperature source Temporal, resp. rate 12, height '5\' 3"'$  (1.6 m), weight 73.5 kg, SpO2 97 %.    PE: Gen: NAD, Alert and Oriented HEENT:  [place]/AT, EOMI Neck: Supple, no LAD Lungs: CTA Bilaterally CV: RRR without M/G/R ABD: Soft, NTND, +BS Ext: No C/C/E  Assessment/Plan: 1) Screening colonoscopy.  Eleshia Wooley D 03/06/2022, 9:42 AM

## 2022-03-06 NOTE — Anesthesia Preprocedure Evaluation (Signed)
Anesthesia Evaluation  Patient identified by MRN, date of birth, ID band Patient awake    Reviewed: Allergy & Precautions, NPO status , Patient's Chart, lab work & pertinent test results  History of Anesthesia Complications (+) PONV and history of anesthetic complications  Airway Mallampati: II  TM Distance: >3 FB Neck ROM: Full    Dental no notable dental hx.    Pulmonary pneumonia,    Pulmonary exam normal breath sounds clear to auscultation       Cardiovascular + angina + CAD and + Past MI  Normal cardiovascular exam Rhythm:Regular Rate:Normal     Neuro/Psych  Headaches,    GI/Hepatic Neg liver ROS, GERD  ,  Endo/Other  Hypothyroidism   Renal/GU Renal disease     Musculoskeletal  (+) Arthritis ,   Abdominal   Peds  Hematology  (+) Blood dyscrasia, anemia ,   Anesthesia Other Findings   Reproductive/Obstetrics                             Anesthesia Physical  Anesthesia Plan  ASA: III  Anesthesia Plan: MAC   Post-op Pain Management: Minimal or no pain anticipated   Induction: Intravenous  PONV Risk Score and Plan: 3 and Ondansetron, Treatment may vary due to age or medical condition, Propofol infusion and Midazolam  Airway Management Planned: Nasal Cannula  Additional Equipment:   Intra-op Plan:   Post-operative Plan:   Informed Consent: I have reviewed the patients History and Physical, chart, labs and discussed the procedure including the risks, benefits and alternatives for the proposed anesthesia with the patient or authorized representative who has indicated his/her understanding and acceptance.     Dental advisory given  Plan Discussed with: CRNA and Anesthesiologist  Anesthesia Plan Comments:         Anesthesia Quick Evaluation

## 2022-03-06 NOTE — Transfer of Care (Signed)
Immediate Anesthesia Transfer of Care Note  Patient: Brittany Mason  Procedure(s) Performed: COLONOSCOPY WITH PROPOFOL POLYPECTOMY  Patient Location: PACU and Endoscopy Unit  Anesthesia Type:MAC  Level of Consciousness: awake, alert , oriented and patient cooperative  Airway & Oxygen Therapy: Patient Spontanous Breathing and Patient connected to face mask oxygen  Post-op Assessment: Report given to RN, Post -op Vital signs reviewed and stable and Patient moving all extremities  Post vital signs: Reviewed and stable  Last Vitals:  Vitals Value Taken Time  BP 90/43 03/06/22 0946  Temp    Pulse 64 03/06/22 0947  Resp 17 03/06/22 0947  SpO2 100 % 03/06/22 0947  Vitals shown include unvalidated device data.  Last Pain:  Vitals:   03/06/22 0909  TempSrc: Temporal  PainSc: 2       Patients Stated Pain Goal: 2 (15/80/63 8685)  Complications: No notable events documented.

## 2022-03-06 NOTE — Anesthesia Postprocedure Evaluation (Signed)
Anesthesia Post Note  Patient: LAROSA RHINES  Procedure(s) Performed: COLONOSCOPY WITH PROPOFOL POLYPECTOMY     Patient location during evaluation: PACU Anesthesia Type: MAC Level of consciousness: awake and alert Pain management: pain level controlled Vital Signs Assessment: post-procedure vital signs reviewed and stable Respiratory status: spontaneous breathing, nonlabored ventilation and respiratory function stable Cardiovascular status: blood pressure returned to baseline and stable Postop Assessment: no apparent nausea or vomiting Anesthetic complications: no   No notable events documented.  Last Vitals:  Vitals:   03/06/22 1000 03/06/22 1010  BP: 107/88 114/60  Pulse: 67 62  Resp: 18 16  Temp:    SpO2: 95% 99%    Last Pain:  Vitals:   03/06/22 1010  TempSrc:   PainSc: 0-No pain                 Lynda Rainwater

## 2022-03-06 NOTE — Discharge Instructions (Signed)

## 2022-03-09 ENCOUNTER — Encounter (HOSPITAL_COMMUNITY): Payer: Self-pay | Admitting: Gastroenterology

## 2022-03-09 LAB — SURGICAL PATHOLOGY

## 2022-03-18 ENCOUNTER — Ambulatory Visit (INDEPENDENT_AMBULATORY_CARE_PROVIDER_SITE_OTHER): Payer: Medicare Other

## 2022-03-18 ENCOUNTER — Ambulatory Visit (INDEPENDENT_AMBULATORY_CARE_PROVIDER_SITE_OTHER): Payer: Medicare Other | Admitting: Orthopedic Surgery

## 2022-03-18 DIAGNOSIS — M79642 Pain in left hand: Secondary | ICD-10-CM

## 2022-03-19 ENCOUNTER — Encounter: Payer: Self-pay | Admitting: Orthopedic Surgery

## 2022-03-19 NOTE — Progress Notes (Signed)
Office Visit Note   Patient: Brittany Mason           Date of Birth: 1951-03-11           MRN: [id number] Visit Date: 03/18/2022 Requested by: Kathyrn Lass, [place],  [place] [address] PCP: Kathyrn Lass, MD  Subjective: Chief Complaint  Patient presents with   left index finger numbness follow up    EMG/NCS review    HPI: Brittany Mason is a 71 y.o. female who presents to the office reporting left index finger numbness.  Since she was last seen she has had an EMG nerve study which was negative for carpal tunnel syndrome or any nerve compression.  Denies any neck pain or radicular symptoms.  Has a history of hitting her left hand about 6 months ago.  She did not really develop symptoms of the numbness until recently.  Hard to say if those 2 events are connected..                ROS: All systems reviewed are negative as they relate to the chief complaint within the history of present illness.  Patient denies fevers or chills.  Assessment & Plan: Visit Diagnoses:  1. Pain in left hand     Plan: Impression is left index finger numbness with no clear explanation in terms of nerve compression at the wrist or neck.  No history of trauma to the finger.  I would favor observation for now.  If symptoms do not improve could consider referral to hand surgeon or proceed with further imaging of the finger and/or hand.  Follow-up as needed.  Follow-Up Instructions: No follow-ups on file.   Orders:  Orders Placed This Encounter  Procedures   XR Hand 2 View Left   No orders of the defined types were placed in this encounter.     Procedures: No procedures performed   Clinical Data: No additional findings.  Objective: Vital Signs: There were no vitals taken for this visit.  Physical Exam:  Constitutional: Patient appears well-developed HEENT:  Head: Normocephalic Eyes:EOM are normal Neck: Normal range of motion Cardiovascular: Normal rate Pulmonary/chest:  Effort normal Neurologic: Patient is alert Skin: Skin is warm Psychiatric: Patient has normal mood and affect  Ortho Exam: Ortho exam demonstrates good cervical spine range of motion.  She has excellent range of motion of all digits with flexion extension AB duction interosseous strength.  All digits are perfused and sensate except for the radial aspect of the distal index finger.  Specialty Comments:  No specialty comments available.  Imaging: No results found.   PMFS History: Patient Active Problem List   Diagnosis Date Noted   Acute blood loss anemia transfused 2 U PRBCs 02/11/18 02/13/2018   Chronic combined systolic and diastolic HF (heart failure) ([place]) 02/13/2018   Intramuscular hematoma 02/13/2018   Retroperitoneal hemorrhage complicating cardiac catheterization 02/11/2018   CAD in native artery 12/04/2015   Cardiomyopathy, ischemic 12/04/2015   CAD (coronary artery disease), native coronary artery 12/04/2015   History of pulmonary embolism 11/25/2015   Lactic acidosis 11/25/2015   AKI (acute kidney injury) ([place]) 11/25/2015   Aspiration pneumonia ([place]) 11/25/2015   Hyperglycemia 11/25/2015   Normocytic anemia 11/25/2015   Acute respiratory failure with hypoxia [facility])    Cardiogenic shock (HCC)    NSTEMI (non-ST elevated myocardial infarction) ([place])    Cardiac arrest ([place]) 11/19/2015   Angina pectoris ([place]) 08/28/2015   Retinal detachment 05/07/2014   OA (  osteoarthritis) of knee 01/08/2014   Preoperative respiratory examination 11/22/2013   Pulmonary embolism ([place]) 04/18/2013   Chronic back pain 04/18/2013   Dyspnea on exertion 04/06/2013   Hyperhidrosis 12/22/2012   Hypothyroidism    Hypercholesterolemia    Past Medical History:  Diagnosis Date   Acute pulmonary embolism ([place]) 06/20/2015   Arthritis    osteoarthritis-knees. Chronic back pain   Basal cell carcinoma of right shoulder    "burned off"   CAD in native artery    a. V fib arrest 2017 s/p DES to LCx  with occluded dCx. b. repeat cath 02/2018 treated medically (see report) - c/b RPH.   Chronic combined systolic and diastolic HF (heart failure) (HCC) 02/13/2018   Chronic SI joint pain    DVT (deep venous thrombosis) ([place]) 04/2013   left lower leg, resulted in Pulmonary emboli on hormone therapy   Dyspnea on exertion    GERD (gastroesophageal reflux disease)    Hypercholesterolemia    Hypothyroidism    Intramuscular hematoma 02/13/2018   Migraine    "< once/month" (12/04/2015)   MVP (mitral valve prolapse)    asymptomatic   Myocardial infarction ([place]) 11/19/2015   cardiac arrest   PE (pulmonary embolism) 04/18/2013   tx. -using Xarelto now, no further lung problems, denies SOB on 01-02-14   PONV (postoperative nausea and vomiting)    Retroperitoneal hematoma    RLS (restless legs syndrome)     Family History  Problem Relation Age of Onset   Hypertension Mother    Congestive Heart Failure Mother    Coronary artery disease Father    Hypertension Father    Stroke Brother        DIED AT 55   Cancer Maternal Aunt        lung, colon   Breast cancer Maternal Aunt    Breast cancer Paternal Aunt     Past Surgical History:  Procedure Laterality Date   ANTERIOR CRUCIATE LIGAMENT REPAIR Left 1976; 1981   "open"   CARDIAC CATHETERIZATION N/A 11/20/2015   Procedure: Left Heart Cath and Coronary Angiography;  Surgeon: Belva Crome, MD;  Location: [place] [facility];  Service: Cardiovascular;  Laterality: N/A;   CARDIAC CATHETERIZATION N/A 12/04/2015   Procedure: Left Heart Cath and Coronary Angiography;  Surgeon: Peter M Martinique, MD;  Location: [place] [facility];  Service: Cardiovascular;  Laterality: N/A;   CARDIAC CATHETERIZATION  12/04/2015   Procedure: Coronary Stent Intervention;  Surgeon: Peter M Martinique, MD;  Location: [place] [facility];  Service: Cardiovascular;;   COLONOSCOPY  09/28/2011   Procedure: COLONOSCOPY;  Surgeon: Juanita Craver, MD;  [facility];  Service:  Endoscopy;  Laterality: N/A;   COLONOSCOPY WITH PROPOFOL N/A 01/05/2017   Procedure: COLONOSCOPY WITH PROPOFOL;  Surgeon: Juanita Craver, MD;  [facility];  Service: Endoscopy;  Laterality: N/A;   COLONOSCOPY WITH PROPOFOL N/A 03/06/2022   Procedure: COLONOSCOPY WITH PROPOFOL;  Surgeon: Carol Ada, MD;  [facility];  Service: Gastroenterology;  Laterality: N/A;   INTRAVASCULAR PRESSURE WIRE/FFR STUDY N/A 02/11/2018   Procedure: INTRAVASCULAR PRESSURE WIRE/FFR STUDY;  Surgeon: Sherren Mocha, MD;  Location: [place] [facility];  Service: Cardiovascular;  Laterality: N/A;   JOINT REPLACEMENT     LEFT HEART CATH AND CORONARY ANGIOGRAPHY N/A 10/08/2016   Procedure: Left Heart Cath and Coronary Angiography;  Surgeon: Peter M Martinique, MD;  Location: [place] [facility];  Service: Cardiovascular;  Laterality: N/A;   LEFT HEART CATH AND CORONARY  ANGIOGRAPHY N/A 02/11/2018   Procedure: LEFT HEART CATH AND CORONARY ANGIOGRAPHY;  Surgeon: Sherren Mocha, MD;  Location: [place] [facility];  Service: Cardiovascular;  Laterality: N/A;   POLYPECTOMY  03/06/2022   Procedure: POLYPECTOMY;  Surgeon: Carol Ada, MD;  [facility];  Service: Gastroenterology;;   TOTAL ABDOMINAL HYSTERECTOMY  1998   TOTAL KNEE ARTHROPLASTY Left 01/08/2014   Procedure: LEFT TOTAL KNEE ARTHROPLASTY;  Surgeon: Gearlean Alf, MD;  [facility];  Service: Orthopedics;  Laterality: Left;   Social History   Occupational History   Not on file  Tobacco Use   Smoking status: Never   Smokeless tobacco: Never  Vaping Use   Vaping Use: Never used  Substance and Sexual Activity   Alcohol use: No   Drug use: No   Sexual activity: Never    Birth control/protection: Surgical    Comment: HYSTERECTOMY

## 2022-03-20 ENCOUNTER — Ambulatory Visit (HOSPITAL_COMMUNITY)
Admission: RE | Admit: 2022-03-20 | Discharge: 2022-03-20 | Disposition: A | Discharge status: Home / Self Care | Payer: Medicare Other | Source: Ambulatory Visit | Attending: Cardiology | Admitting: Cardiology

## 2022-03-20 DIAGNOSIS — R209 Unspecified disturbances of skin sensation: Secondary | ICD-10-CM | POA: Insufficient documentation

## 2022-04-05 ENCOUNTER — Other Ambulatory Visit: Payer: Self-pay | Admitting: Cardiovascular Disease

## 2022-04-15 ENCOUNTER — Telehealth: Payer: Self-pay | Admitting: Cardiovascular Disease

## 2022-04-15 NOTE — Telephone Encounter (Signed)
Calling to see if its okay to take the RSV vaccine. Please advise

## 2022-04-16 NOTE — Telephone Encounter (Signed)
Called patient and left message for her to call back. Patient called back, per Dr. Johnsie Cancel okay to take RSV vaccine.

## 2022-05-26 ENCOUNTER — Other Ambulatory Visit: Payer: Self-pay | Admitting: Cardiovascular Disease

## 2022-06-03 ENCOUNTER — Other Ambulatory Visit: Payer: Self-pay | Admitting: Cardiovascular Disease

## 2022-08-24 ENCOUNTER — Other Ambulatory Visit: Payer: Self-pay | Admitting: Cardiovascular Disease

## 2022-12-01 NOTE — Progress Notes (Signed)
Cardiology Office Note   Date:  12/11/2022   ID:  Brittany, Mason 01-01-1951, MRN [id number]  PCP:  Sigmund Hazel, MD  Cardiologist:  Dr. Eden Emms, MD   No chief complaint on file.      History of Present Illness: Brittany Mason is a 72 y.o. female has a history of CAD/VF arrest in 2017 with subsequent PCI of the LCx with residual occluded distal disease.  Repeat cardiac cath 02/2018 with patent LCx stent and chronically occluded distal LCx disease with left to left collaterals and diffusely diseased right PDA not amenable to PCI.  Recommendations were for medical treatment.  Post-cath setting was complicated by retroperitoneal hematoma however fortunately there was no surgery required.  Also with a history of migraines and RLS.  She was seen 03/29/2018 for dizziness at which time carotid Dopplers were performed which showed no stenosis.  She tends to have rare angina.  BP/HR runs low  Seeing Dr August Saucer for left shoulder bursitis/arthritis MRI with no tear Post injection Rx 03/05/21  Daughter lives in [place] with two grand kids ages 64/5  Has had only one episode of angina this year relieved with one nitroglycerin in June Needs new nitroglycerin. She is no longer on fenofibrate LdL 69 HDL 56 Only statin she can tolerate is Zocor  Past Medical History:  Diagnosis Date   Acute pulmonary embolism (HCC) 06/20/2015   Arthritis    osteoarthritis-knees. Chronic back pain   Basal cell carcinoma of right shoulder    "burned off"   CAD in native artery    a. V fib arrest 2017 s/p DES to LCx with occluded dCx. b. repeat cath 02/2018 treated medically (see report) - c/b RPH.   Chronic combined systolic and diastolic HF (heart failure) (HCC) 02/13/2018   Chronic SI joint pain    DVT (deep venous thrombosis) (HCC) 04/2013   left lower leg, resulted in Pulmonary emboli on hormone therapy   Dyspnea on exertion    GERD (gastroesophageal reflux disease)    Hypercholesterolemia    Hypothyroidism     Intramuscular hematoma 02/13/2018   Migraine    "< once/month" (12/04/2015)   MVP (mitral valve prolapse)    asymptomatic   Myocardial infarction (HCC) 11/19/2015   cardiac arrest   PE (pulmonary embolism) 04/18/2013   tx. -using Xarelto now, no further lung problems, denies SOB on 01-02-14   PONV (postoperative nausea and vomiting)    Retroperitoneal hematoma    RLS (restless legs syndrome)     Past Surgical History:  Procedure Laterality Date   ANTERIOR CRUCIATE LIGAMENT REPAIR Left 1976; 1981   "open"   CARDIAC CATHETERIZATION N/A 11/20/2015   Procedure: Left Heart Cath and Coronary Angiography;  Surgeon: Lyn Records, MD;  Location: [facility] INVASIVE [facility] LAB;  Service: Cardiovascular;  Laterality: N/A;   CARDIAC CATHETERIZATION N/A 12/04/2015   Procedure: Left Heart Cath and Coronary Angiography;  Surgeon: Mirella Gueye M Swazil[place], MD;  Location: [facility] INVASIVE CV LAB;  Service: Cardiovascular;  Laterality: N/A;   CARDIAC CATHETERIZATION  12/04/2015   Procedure: Coronary Stent Intervention;  Surgeon: Garison Genova M [place]az[place], MD;  Location: [facility] INVASIVE CV LAB;  Service: Cardiovascular;;   COLONOSCOPY  09/28/2011   Procedure: COLONOSCOPY;  Surgeon: Charna Elizabeth, MD;  [facility];  Service: Endoscopy;  Laterality: N/A;   COLONOSCOPY WITH PROPOFOL N/A 01/05/2017   Procedure: COLONOSCOPY WITH PROPOFOL;  Surgeon: Charna Elizabeth, MD;  [facility];  Service: Endoscopy;  Laterality: N/A;  COLONOSCOPY WITH PROPOFOL N/A 03/06/2022   Procedure: COLONOSCOPY WITH PROPOFOL;  Surgeon: Jeani Hawking, MD;  [facility];  Service: Gastroenterology;  Laterality: N/A;   CORONARY PRESSURE/FFR STUDY N/A 02/11/2018   Procedure: INTRAVASCULAR PRESSURE WIRE/FFR STUDY;  Surgeon: Tonny Bollman, MD;  Location: [facility] INVASIVE CV LAB;  Service: Cardiovascular;  Laterality: N/A;   JOINT REPLACEMENT     LEFT HEART CATH AND CORONARY ANGIOGRAPHY N/A 10/08/2016   Procedure: Left Heart Cath and Coronary Angiography;   Surgeon: Barnell Shieh M [place]az[place], MD;  Location: [facility] INVASIVE CV LAB;  Service: Cardiovascular;  Laterality: N/A;   LEFT HEART CATH AND CORONARY ANGIOGRAPHY N/A 02/11/2018   Procedure: LEFT HEART CATH AND CORONARY ANGIOGRAPHY;  Surgeon: Tonny Bollman, MD;  Location: [facility] INVASIVE CV LAB;  Service: Cardiovascular;  Laterality: N/A;   POLYPECTOMY  03/06/2022   Procedure: POLYPECTOMY;  Surgeon: Jeani Hawking, MD;  [facility];  Service: Gastroenterology;;   TOTAL ABDOMINAL HYSTERECTOMY  1998   TOTAL KNEE ARTHROPLASTY Left 01/08/2014   Procedure: LEFT TOTAL KNEE ARTHROPLASTY;  Surgeon: Loanne Drilling, MD;  [facility];  Service: Orthopedics;  Laterality: Left;     Current Outpatient Medications  Medication Sig Dispense Refill   aspirin EC 81 MG tablet Take 81 mg by mouth daily with lunch.     butalbital-acetaminophen-caffeine (FIORICET, ESGIC) 50-325-40 MG tablet Take 1 tablet by mouth every 4 (four) hours as needed for headache or migraine.     clopidogrel (PLAVIX) 75 MG tablet TAKE 1 TABLET(75 MG) BY MOUTH DAILY 90 tablet 3   cyclobenzaprine (FLEXERIL) 5 MG tablet Take 5 mg by mouth 3 (three) times daily as needed for muscle spasms.     ezetimibe (ZETIA) 10 MG tablet TAKE ONE TABLET BY MOUTH DAILY 90 tablet 3   fenofibrate 160 MG tablet Take 160 mg by mouth at bedtime.      FLUoxetine (PROZAC) 20 MG tablet Take 20 mg by mouth daily.     isosorbide mononitrate (IMDUR) 30 MG 24 hr tablet TAKE 1/2 TABLET BY MOUTH EVERY DAY 45 tablet 2   levothyroxine (SYNTHROID) 125 MCG tablet Take 125 mcg by mouth daily.     meclizine (ANTIVERT) 25 MG tablet Take 25 mg by mouth 4 (four) times daily as needed for dizziness.     metoprolol tartrate (LOPRESSOR) 25 MG tablet TAKE 1/2 TABLET(12.5 MG) BY MOUTH TWICE DAILY 90 tablet 3   morphine (MS CONTIN) 30 MG 12 hr tablet Take 30-60 mg by mouth 2 (two) times daily as needed for pain.     nitroGLYCERIN (NITROSTAT) 0.4 MG SL tablet PLACE 1 TABLET UNDER THE TONGUE  EVERY 5 MINUTES AS NEEDED FOR CHEST PAIN 25 tablet 6   omega-3 acid ethyl esters (LOVAZA) 1 G capsule Take 2 g by mouth 2 (two) times daily.     ondansetron (ZOFRAN) 4 MG tablet Take 4 mg by mouth every 8 (eight) hours as needed for nausea or vomiting.     OVER THE COUNTER MEDICATION Take 2 capsules by mouth daily at 6 PM. "Relizen" herbal supplement for hormone replacement     rOPINIRole (REQUIP) 1 MG tablet Take 1 mg by mouth every evening. 6 & 7 pm     scopolamine (TRANSDERM-SCOP) 1 MG/3DAYS Place 1 patch onto the skin as needed (for travel).     simvastatin (ZOCOR) 40 MG tablet TAKE 1 TABLET BY MOUTH EVERY DAY AT 6 PM 90 tablet 1   No current facility-administered medications for this visit.  Allergies:   Atorvastatin, Codeine, Erythromycin, and Sulfamethoxazole-trimethoprim    Social History:  The patient  reports that she has never smoked. She has never used smokeless tobacco. She reports that she does not drink alcohol and does not use drugs.   Family History:  [name] family history includes Breast cancer in her maternal aunt and paternal aunt; Cancer in her maternal aunt; Congestive Heart Failure in her mother; Coronary artery disease in her father; Hypertension in her father and mother; Stroke in her brother.    ROS:  Please see the history of present illness.   Otherwise, review of systems are positive for none.   All other systems are reviewed and negative.    PHYSICAL EXAM: VS:  BP 108/66   Pulse 98   Ht 5\' 3"  (1.6 m)   Wt 163 lb 6.4 oz (74.1 kg)   SpO2 99%   BMI 28.95 kg/m  , BMI Body mass index is 28.95 kg/m.   Affect appropriate Healthy:  appears stated age HEENT: normal Neck supple with no adenopathy JVP normal no bruits no thyromegaly Lungs clear with no wheezing and good diaphragmatic motion Heart:  S1/S2 no murmur, no rub, gallop or click PMI normal Abdomen: benighn, BS positve, no tenderness, no AAA no bruit.  No HSM or HJR Distal pulses intact  with no bruits No edema Neuro non-focal Skin warm and dry No muscular weakness    EKG:  12/11/2022 SR PR 214 msec rate 56 otherwise normal     Recent Labs: No results found for requested labs within last 365 days.    Lipid Panel    Component Value Date/Time   CHOL 139 02/13/2018 0450   CHOL 138 11/18/2016 0859   TRIG 75 02/13/2018 0450   HDL 54 02/13/2018 0450   HDL 56 11/18/2016 0859   CHOLHDL 2.6 02/13/2018 0450   VLDL 15 02/13/2018 0450   LDLCALC 70 02/13/2018 0450   LDLCALC 66 11/18/2016 0859      Wt Readings from Last 3 Encounters:  12/11/22 163 lb 6.4 oz (74.1 kg)  03/03/22 162 lb (73.5 kg)  03/12/21 164 lb (74.4 kg)    Other studies Reviewed: Additional studies/ records that were reviewed today include:  Review of the above records demonstrates:   Carotid 04/05/18  No obstructive dx   Cath 02/11/18   Conclusion    1. Moderate 2 vessel CAD involving the LCx and RCA and chronic occlusion of the distal LCx collateralized from L--->L collaterals 2. Patent left main and LAD with mild nonobstructive disease 3. Low LVEDP 4. Negative FFR of the left circumflex   Recommend: continued medical therapy. There is progressive stenosis of the right PDA but this vessel is diffusely diseased and not favorable for PCI.   Procedure complicated by hypotension after hemostasis is obtained. 0.5 mg atropine and IV fluids are administered. Groin site is tender into the right lower quadrant. Plan stat non-contrast CT.     ASSESSMENT AND PLAN:  1.  Hypotension/dizziness: -chronically low BP . Only on low dose lopressor and imdur   2.  CAD: -Last LHC 02/11/18 with stable disease with patent PCI to mid LCx and chronically occluded distal LCx disease.  She has left to left collaterals with diffuse PDA disease -Very infrequent CP relieved with SL NTG>>no change since last seen  -If progression, will need to notify office  -Continue low dose lopressor and imdur   3. History of  retroperitoneal hematoma post cardiac catheterization: -Occurred post LHC  02/2018 radial cath in future if possible  4.  HLD: -Continue zocor, zetia and fibrates  -Labs per PCP  5.  Hypothyroidism: -Follows with PCP  6. Ortho:  left shoulder arthritis post injection f/u Dr August Saucer   Current medicines are reviewed at length with the patient today.  The patient does not have concerns regarding medicines.  The following changes have been made:  no change  Labs/ tests ordered today include:  none  Orders Placed This Encounter  Procedures   EKG 12-Lead    Disposition:   FU with me in a year   Signed, Charlton Haws, MD  12/11/2022 9:07 AM    [facility] [address] [place], [place], [place]  [address] Phone: [phone]; Fax: [phone]

## 2022-12-11 ENCOUNTER — Ambulatory Visit: Payer: Medicare Other | Attending: Cardiovascular Disease | Admitting: Cardiovascular Disease

## 2022-12-11 ENCOUNTER — Encounter: Payer: Self-pay | Admitting: Cardiovascular Disease

## 2022-12-11 VITALS — BP 108/66 | HR 98 | Ht 63.0 in | Wt 163.4 lb

## 2022-12-11 DIAGNOSIS — I2510 Atherosclerotic heart disease of native coronary artery without angina pectoris: Secondary | ICD-10-CM | POA: Insufficient documentation

## 2022-12-11 DIAGNOSIS — E785 Hyperlipidemia, unspecified: Secondary | ICD-10-CM | POA: Diagnosis not present

## 2022-12-11 MED ORDER — CLOPIDOGREL BISULFATE 75 MG PO TABS: 75.0000 mg | ORAL_TABLET | Freq: Every day | ORAL | 3 refills | Status: DC

## 2022-12-11 MED ORDER — METOPROLOL TARTRATE 25 MG PO TABS: 12.5000 mg | ORAL_TABLET | Freq: Two times a day (BID) | ORAL | 3 refills | Status: DC

## 2022-12-11 MED ORDER — NITROGLYCERIN 0.4 MG SL SUBL: SUBLINGUAL_TABLET | SUBLINGUAL | 6 refills | Status: DC

## 2022-12-11 MED ORDER — ISOSORBIDE MONONITRATE ER 30 MG PO TB24: 15.0000 mg | ORAL_TABLET | Freq: Every day | ORAL | 2 refills | Status: DC

## 2022-12-11 MED ORDER — EZETIMIBE 10 MG PO TABS: 10.0000 mg | ORAL_TABLET | Freq: Every day | ORAL | 3 refills | Status: DC

## 2022-12-11 NOTE — Patient Instructions (Signed)
Medication Instructions:  Your physician recommends that you continue on your current medications as directed. Please refer to the Current Medication list given to you today.  *If you need a refill on your cardiac medications before your next appointment, please call your pharmacy*  Lab Work: If you have labs (blood work) drawn today and your tests are completely normal, you will receive your results only by: MyChart Message (if you have MyChart) OR A paper copy in the mail If you have any lab test that is abnormal or we need to change your treatment, we will call you to review the results.  Testing/Procedures: None ordered today.  Follow-Up: At [facility], you and your health needs are our priority.  As part of our continuing mission to provide you with exceptional heart care, we have created designated Provider Care Teams.  These Care Teams include your primary Cardiologist (physician) and Advanced Practice Providers (APPs -  Physician Assistants and Nurse Practitioners) who all work together to provide you with the care you need, when you need it.  We recommend signing up for the patient portal called "MyChart".  Sign up information is provided on this After Visit Summary.  MyChart is used to connect with patients for Virtual Visits (Telemedicine).  Patients are able to view lab/test results, encounter notes, upcoming appointments, etc.  Non-urgent messages can be sent to your provider as well.   To learn more about what you can do with MyChart, go to [email or web address].    Your next appointment:   1 year(s)  Provider:   [name], MD      

## 2023-02-14 ENCOUNTER — Other Ambulatory Visit: Payer: Self-pay | Admitting: Cardiovascular Disease

## 2023-04-15 ENCOUNTER — Other Ambulatory Visit (HOSPITAL_BASED_OUTPATIENT_CLINIC_OR_DEPARTMENT_OTHER): Payer: Self-pay

## 2023-04-15 MED ORDER — COMIRNATY 30 MCG/0.3ML IM SUSY
0.3000 mL | PREFILLED_SYRINGE | Freq: Once | INTRAMUSCULAR | 0 refills | Status: AC
  Filled 2023-04-15: qty 0.3, 1d supply, fill #0

## 2023-04-16 ENCOUNTER — Other Ambulatory Visit (HOSPITAL_BASED_OUTPATIENT_CLINIC_OR_DEPARTMENT_OTHER): Payer: Self-pay

## 2023-05-11 ENCOUNTER — Telehealth: Payer: Self-pay | Admitting: Cardiovascular Disease

## 2023-05-11 NOTE — Telephone Encounter (Signed)
Pt c/o medication issue:  1. Name of Medication: Rogaine 5% Minoxidil foam   2. How are you currently taking this medication (dosage and times per day)? Not currently using   3. Are you having a reaction (difficulty breathing--STAT)? No   4. What is your medication issue? Patient states this was recommended for her by one of her providers. She is wanting to confirm it is okay for her to use before starting.

## 2023-05-12 NOTE — Telephone Encounter (Signed)
Called patient and made her aware of pharmacist, Malena Peer, advisement. It is okay for her to start new medication.

## 2023-05-12 NOTE — Telephone Encounter (Signed)
Yes, this is ok to use.

## 2023-09-03 ENCOUNTER — Other Ambulatory Visit (HOSPITAL_BASED_OUTPATIENT_CLINIC_OR_DEPARTMENT_OTHER): Payer: Self-pay

## 2023-09-07 ENCOUNTER — Other Ambulatory Visit: Payer: Self-pay | Admitting: Cardiovascular Disease

## 2023-12-14 ENCOUNTER — Other Ambulatory Visit: Payer: Self-pay | Admitting: Cardiovascular Disease

## 2023-12-15 ENCOUNTER — Other Ambulatory Visit: Payer: Self-pay | Admitting: Cardiovascular Disease

## 2023-12-23 ENCOUNTER — Telehealth: Payer: Self-pay | Admitting: Cardiovascular Disease

## 2023-12-23 NOTE — Telephone Encounter (Signed)
 Pt requesting c/b in regards to whether it is okay for her to take a hair supplement by the name of Mutrafol. Please advise

## 2023-12-23 NOTE — Telephone Encounter (Signed)
 Would there me any contraindications for pt to start supplement?

## 2023-12-29 ENCOUNTER — Other Ambulatory Visit (HOSPITAL_BASED_OUTPATIENT_CLINIC_OR_DEPARTMENT_OTHER): Payer: Self-pay

## 2023-12-29 MED ORDER — COVID-19 MRNA VAC-TRIS(PFIZER) 30 MCG/0.3ML IM SUSY
0.3000 mL | PREFILLED_SYRINGE | Freq: Once | INTRAMUSCULAR | 0 refills | Status: AC
  Filled 2023-12-29: qty 0.3, 1d supply, fill #0

## 2023-12-29 NOTE — Telephone Encounter (Signed)
 Tried to call pt, no answer, left msg for pt to call back.

## 2023-12-31 NOTE — Telephone Encounter (Signed)
 Patient returned RN's call regarding supplement medication.

## 2023-12-31 NOTE — Telephone Encounter (Signed)
 Called patient to let her know of PharmD's advisement.   Brittany Mason, Brittany Mason, [facility]   12/28/23  5:11 PM Pt is welcome to try

## 2024-01-06 NOTE — Progress Notes (Signed)
 Cardiology Office Note   Date:  01/10/2024  ID:  Brittany, Mason [birth year], MRN [id number] PCP: Cleotilde Planas, MD  [facility] Providers Cardiologist:  Maude Emmer, MD    History of Present Illness Brittany Mason is a 73 y.o. female with past medical history of CAD/VF arrest in 2017 with subsequent PCI to LCx with residual occluded disease distally.  Repeat cardiac cath 02/2018 with patent LCx stent and chronically occluded distal LCx disease with left to left collaterals and diffusely diseased right PDA not amendable to PCI.  Recommendations were for medical treatment.  Post cath setting was complicated by retroperitoneal hematoma however fortunately there was no surgery required.  Also has history of migraines and RLS.  She was seen 03/29/2018 for dizziness at which time carotid Dopplers were performed with no stenosis.  Also tends to have rare angina.  BP/heart rate runs low.  Was seeing Dr. Addie for left shoulder bursitis/arthritis with MRI.  This showed no tear postinjection and medical management was recommended, 02/25/2021.  Has a daughter lives in [place] with 2 grandkids ages 2 and 72.  Had 1 episode of angina last year which was relieved with 1 nitroglycerin .  No longer on fenofibrate .  LDL 69, HDL 56.  Only statin she can tolerate Zocor .  Today, she presents with coronary artery disease for a one-year follow-up visit.  She occasionally experiences lightheadedness similar to when she previously took nitroglycerin , although she has not used it in the past year. Her coronary artery disease was managed with cardiac catheterization in 2019, but annual catheterizations were discontinued after a retroperitoneal bleed. Her last carotid ultrasound in 2023 showed no blockages. Cholesterol levels have improved, with triglycerides at 90 and LDL at 68. She is on Emdor (isosorbide  mononitrate) half a pill daily, Lopressor  (metoprolol  tartrate) 12.5 mg twice a day, Plavix  (clopidogrel ), and  aspirin . Her blood pressure is typically low, around 106/62.  Reports no shortness of breath nor dyspnea on exertion. Reports no chest pain, pressure, or tightness. No edema, orthopnea, PND. Reports no palpitations.   Discussed the use of AI scribe software for clinical note transcription with the patient, who gave verbal consent to proceed.  ROS: Pertinent ROS in HPI  Studies Reviewed EKG Interpretation Date/Time:  Monday January 10 2024 14:04:33 EDT Ventricular Rate:  56 PR Interval:  208 QRS Duration:  96 QT Interval:  450 QTC Calculation: 434 R Axis:   59  Text Interpretation: Sinus bradycardia with Premature atrial complexes in a pattern of bigeminy When compared with ECG of 11-Dec-2022 09:00, Premature atrial complexes are now Present Confirmed by Lucien Blanc [phone]) on 01/10/2024 2:53:51 PM     Cardiac cath 02/11/2018 Left Anterior Descending  Prox LAD lesion is 30% stenosed.    First Diagonal Branch  Ost 1st Diag to 1st Diag lesion is 50% stenosed.    Left Circumflex  Collaterals  Mid Cx filled by collaterals from Dist RCA.    Collaterals  Dist Cx filled by collaterals from 3rd Mrg.    Previously placed Prox Cx drug eluting stent is widely patent.  Prox Cx to Mid Cx lesion is 100% stenosed.  Mid Cx lesion is 60% stenosed. 50-60% stenosis at the distal stent edge is present. FFR is performed. The resting FFR is 0.98. The FFR at peak hyperemia with IV adenosine  is 0.87.    Right Coronary Artery  Prox RCA to Mid RCA lesion is 30% stenosed.    Right Posterior Descending Artery  RPDA lesion  is 75% stenosed. there is diffuse PDA stenosis extending into the mid/distal PDA branch    Intervention   No interventions have been documented.   Coronary Diagrams  Diagnostic Dominance: Right  Intervention  Physical Exam VS:  BP 106/62   Pulse (!) 56   Ht 5' 3 (1.6 m)   Wt 167 lb 3.2 oz (75.8 kg)   SpO2 99%   BMI 29.62 kg/m        Wt Readings from Last 3  Encounters:  01/10/24 167 lb 3.2 oz (75.8 kg)  12/11/22 163 lb 6.4 oz (74.1 kg)  03/03/22 162 lb (73.5 kg)    GEN: Well nourished, well developed in no acute distress NECK: No JVD; No carotid bruits CARDIAC: RRR with PACs, no murmurs, rubs, gallops RESPIRATORY:  Clear to auscultation without rales, wheezing or rhonchi  ABDOMEN: Soft, non-tender, non-distended EXTREMITIES:  No edema; No deformity   ASSESSMENT AND PLAN  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 Continued dual antiplatelet therapy necessary. - Continue aspirin  and clopidogrel  indefinitely.  Paroxysmal atrial premature complexes (PACs) PACs noted on EKG, asymptomatic, not dangerous unless frequent. No immediate need for heart monitor unless symptoms develop. - Send EKG to Dr. Nishan for review. - Monitor for symptoms such as skipped beats, lightheadedness, or dizziness. - Consider heart monitor if symptoms develop.  Essential hypertension Blood pressure low-normal due to current medications. No significant symptoms. - Continue isosorbide  mononitrate and metoprolol  tartrate. - Monitor blood pressure.  Hyperlipidemia Cholesterol levels improved, current management effective. - Continue current lipid management regimen.  Weight management Discussed weight loss medication (Zepbound) and lifestyle changes. Concerns about long-term use and cost. - Discuss potential lifestyle changes to support weight loss. -She will get prescription from her PCP at her next appointment      Dispo: She can follow-up in a year with Dr. Delford  Signed, Orren LOISE Fabry, PA-C

## 2024-01-10 ENCOUNTER — Ambulatory Visit: Attending: Physician Assistant | Admitting: Physician Assistant

## 2024-01-10 ENCOUNTER — Encounter: Payer: Self-pay | Admitting: Physician Assistant

## 2024-01-10 VITALS — BP 106/62 | HR 56 | Ht 63.0 in | Wt 167.2 lb

## 2024-01-10 DIAGNOSIS — I2510 Atherosclerotic heart disease of native coronary artery without angina pectoris: Secondary | ICD-10-CM | POA: Insufficient documentation

## 2024-01-10 DIAGNOSIS — R42 Dizziness and giddiness: Secondary | ICD-10-CM | POA: Insufficient documentation

## 2024-01-10 DIAGNOSIS — E785 Hyperlipidemia, unspecified: Secondary | ICD-10-CM | POA: Insufficient documentation

## 2024-01-10 DIAGNOSIS — E039 Hypothyroidism, unspecified: Secondary | ICD-10-CM | POA: Diagnosis present

## 2024-01-10 DIAGNOSIS — T82837D Hemorrhage of cardiac prosthetic devices, implants and grafts, subsequent encounter: Secondary | ICD-10-CM | POA: Diagnosis present

## 2024-01-10 DIAGNOSIS — I9589 Other hypotension: Secondary | ICD-10-CM | POA: Insufficient documentation

## 2024-01-10 MED ORDER — SIMVASTATIN 40 MG PO TABS: ORAL_TABLET | ORAL | 3 refills | Status: AC

## 2024-01-10 MED ORDER — METOPROLOL TARTRATE 25 MG PO TABS: 12.5000 mg | ORAL_TABLET | Freq: Two times a day (BID) | ORAL | 3 refills | Status: AC

## 2024-01-10 MED ORDER — CLOPIDOGREL BISULFATE 75 MG PO TABS: ORAL_TABLET | ORAL | 3 refills | Status: AC

## 2024-01-10 MED ORDER — EZETIMIBE 10 MG PO TABS: 10.0000 mg | ORAL_TABLET | Freq: Every day | ORAL | 3 refills | Status: AC

## 2024-01-10 MED ORDER — ISOSORBIDE MONONITRATE ER 30 MG PO TB24: 15.0000 mg | ORAL_TABLET | Freq: Every day | ORAL | 3 refills | Status: AC

## 2024-01-10 MED ORDER — NITROGLYCERIN 0.4 MG SL SUBL: SUBLINGUAL_TABLET | SUBLINGUAL | 5 refills | Status: AC

## 2024-01-10 NOTE — Patient Instructions (Signed)
 Medication Instructions:  Your physician recommends that you continue on your current medications as directed. Please refer to the Current Medication list given to you today.  *If you need a refill on your cardiac medications before your next appointment, please call your pharmacy*  Lab Work: NONE If you have labs (blood work) drawn today and your tests are completely normal, you will receive your results only by: MyChart Message (if you have MyChart) OR A paper copy in the mail If you have any lab test that is abnormal or we need to change your treatment, we will call you to review the results.  Testing/Procedures: NONE  Follow-Up: At [facility], you and your health needs are our priority.  As part of our continuing mission to provide you with exceptional heart care, our providers are all part of one team.  This team includes your primary Cardiologist (physician) and Advanced Practice Providers or APPs (Physician Assistants and Nurse Practitioners) who all work together to provide you with the care you need, when you need it.  Your next appointment:   1 year(s)  Provider:   Maude Emmer, MD   We recommend signing up for the patient portal called MyChart.  Sign up information is provided on this After Visit Summary.  MyChart is used to connect with patients for Virtual Visits (Telemedicine).  Patients are able to view lab/test results, encounter notes, upcoming appointments, etc.  Non-urgent messages can be sent to your provider as well.   To learn more about what you can do with MyChart, go to [email or web address].

## 2024-01-12 ENCOUNTER — Ambulatory Visit: Payer: Self-pay | Admitting: Cardiovascular Disease

## 2024-01-12 ENCOUNTER — Encounter: Payer: Self-pay | Admitting: Family Medicine

## 2024-03-02 ENCOUNTER — Other Ambulatory Visit (HOSPITAL_BASED_OUTPATIENT_CLINIC_OR_DEPARTMENT_OTHER): Payer: Self-pay

## 2024-03-02 MED ORDER — FLUZONE HIGH-DOSE 0.5 ML IM SUSY
0.5000 mL | PREFILLED_SYRINGE | Freq: Once | INTRAMUSCULAR | 0 refills | Status: AC
Start: 2024-03-02 — End: 2024-03-03
  Filled 2024-03-02: qty 0.5, 1d supply, fill #0

## 2024-05-12 ENCOUNTER — Other Ambulatory Visit (HOSPITAL_COMMUNITY): Payer: Self-pay | Admitting: Sports Medicine

## 2024-05-12 DIAGNOSIS — M79671 Pain in right foot: Secondary | ICD-10-CM

## 2024-05-13 ENCOUNTER — Ambulatory Visit (HOSPITAL_COMMUNITY): Admission: RE | Admit: 2024-05-13 | Discharge: 2024-05-13 | Attending: Sports Medicine | Admitting: Sports Medicine

## 2024-05-13 DIAGNOSIS — M79671 Pain in right foot: Secondary | ICD-10-CM

## 2024-06-29 ENCOUNTER — Other Ambulatory Visit (HOSPITAL_BASED_OUTPATIENT_CLINIC_OR_DEPARTMENT_OTHER): Payer: Self-pay

## 2024-06-29 MED ORDER — COMIRNATY 30 MCG/0.3ML IM SUSY
0.3000 mL | PREFILLED_SYRINGE | Freq: Once | INTRAMUSCULAR | 0 refills | Status: AC
  Filled 2024-06-29 – 2024-07-06 (×2): qty 0.3, 1d supply, fill #0

## 2024-07-06 ENCOUNTER — Other Ambulatory Visit (HOSPITAL_BASED_OUTPATIENT_CLINIC_OR_DEPARTMENT_OTHER): Payer: Self-pay

## 2024-07-06 ENCOUNTER — Other Ambulatory Visit (HOSPITAL_COMMUNITY): Payer: Self-pay

## 2024-07-31 ENCOUNTER — Encounter: Admitting: Orthopedic Surgery
